# Patient Record
Sex: Female | Born: 1965 | Race: White | Hispanic: No | Marital: Married | Smoking: Never smoker
Health system: Southern US, Community
[De-identification: ages and names within clinical notes are randomized; demographics above are authoritative.]

## PROBLEM LIST (undated history)

## (undated) DIAGNOSIS — Q613 Polycystic kidney, unspecified: Secondary | ICD-10-CM

## (undated) DIAGNOSIS — A419 Sepsis, unspecified organism: Secondary | ICD-10-CM

## (undated) DIAGNOSIS — H3320 Serous retinal detachment, unspecified eye: Secondary | ICD-10-CM

## (undated) DIAGNOSIS — M069 Rheumatoid arthritis, unspecified: Secondary | ICD-10-CM

## (undated) HISTORY — PX: OTHER SURGICAL HISTORY: SHX169

## (undated) HISTORY — DX: Sepsis, unspecified organism: A41.9

## (undated) HISTORY — DX: Serous retinal detachment, unspecified eye: H33.20

## (undated) HISTORY — PX: GANGLION CYST EXCISION: SHX1691

## (undated) HISTORY — PX: VITRECTOMY: SHX106

## (undated) HISTORY — PX: AUGMENTATION MAMMAPLASTY: SUR837

## (undated) HISTORY — PX: RETINAL TEAR REPAIR CRYOTHERAPY: SHX5304

## (undated) HISTORY — PX: ABDOMINAL HERNIA REPAIR: SHX539

## (undated) HISTORY — PX: CARPAL TUNNEL RELEASE: SHX101

---

## 2006-06-15 ENCOUNTER — Encounter: Admission: RE | Admit: 2006-06-15 | Discharge: 2006-06-15 | Payer: Self-pay | Admitting: Nephrology

## 2006-06-15 IMAGING — US US RENAL
1 series · 14 of 25 positions shown · non-contrast
Comparison: none

CLINICAL DATA: Polycystic kidney disease. 
 RENAL ULTRASOUND:
TECHNIQUE: Complete ultrasound examination of the urinary tract was performed including evaluation of the kidneys, renal collecting systems, and urinary bladder.

[Series 1: us renal · 0.35mm/px · 14 of 47 slices shown]
[im 1/47]
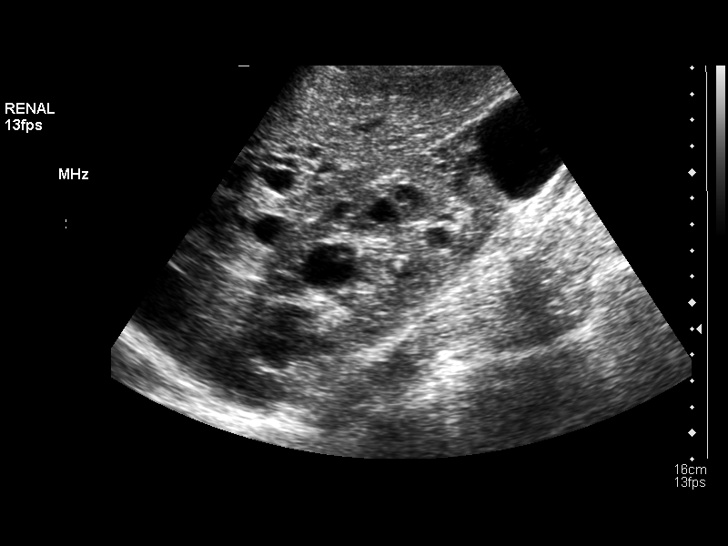
[im 4/47]
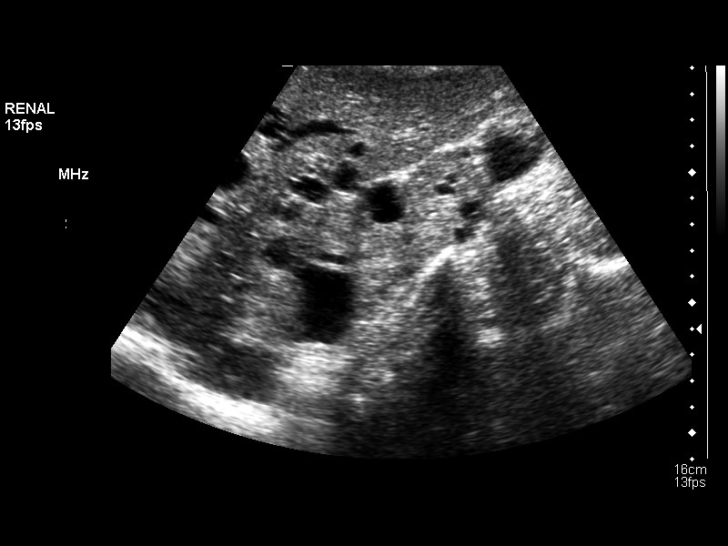
[im 8/47]
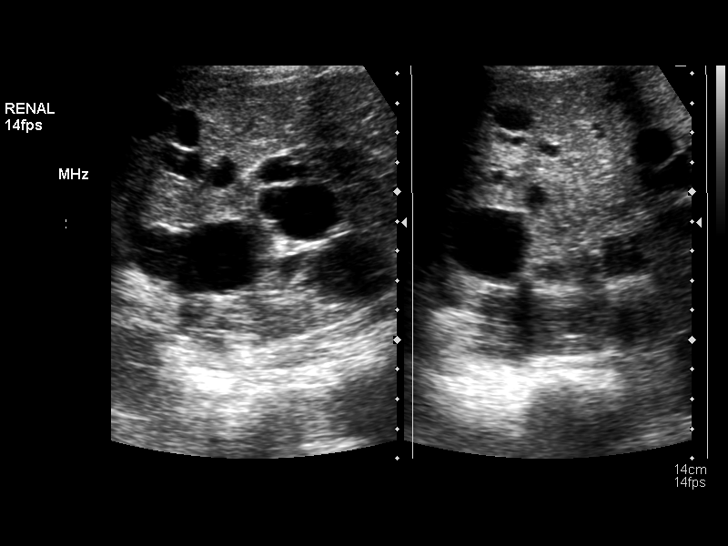
[im 12/47]
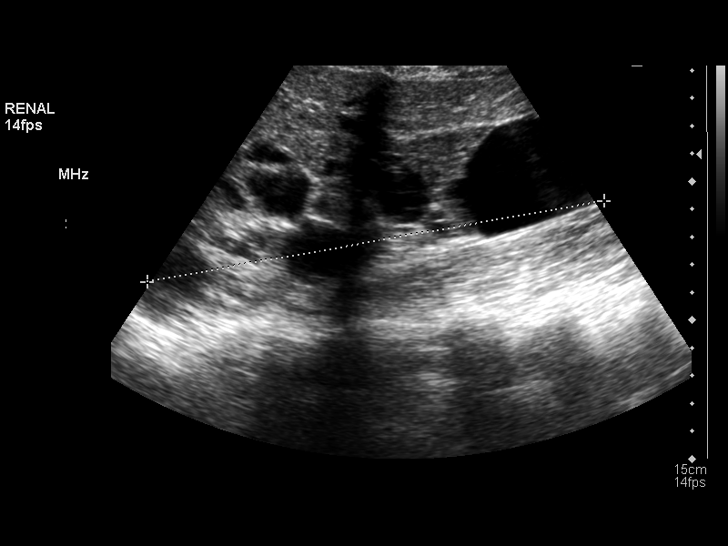
[im 16/47]
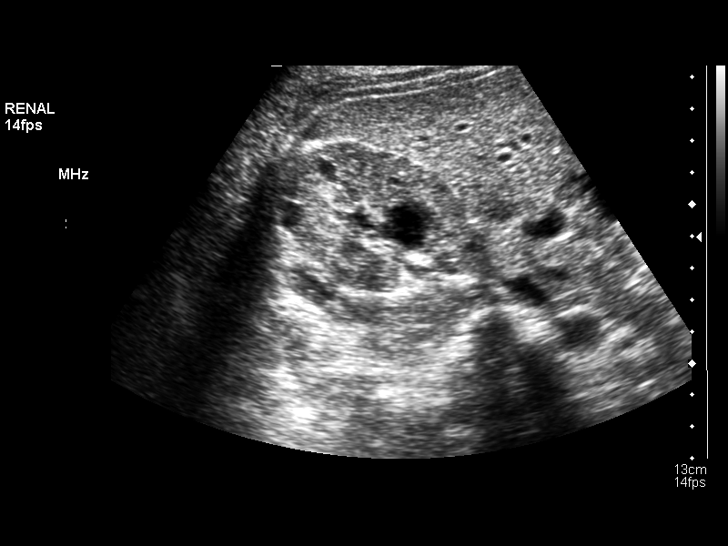
[im 18/47]
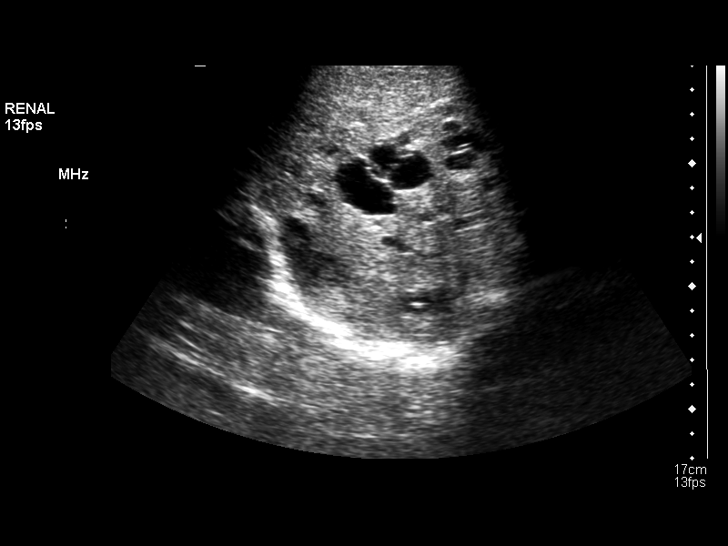
[im 22/47]
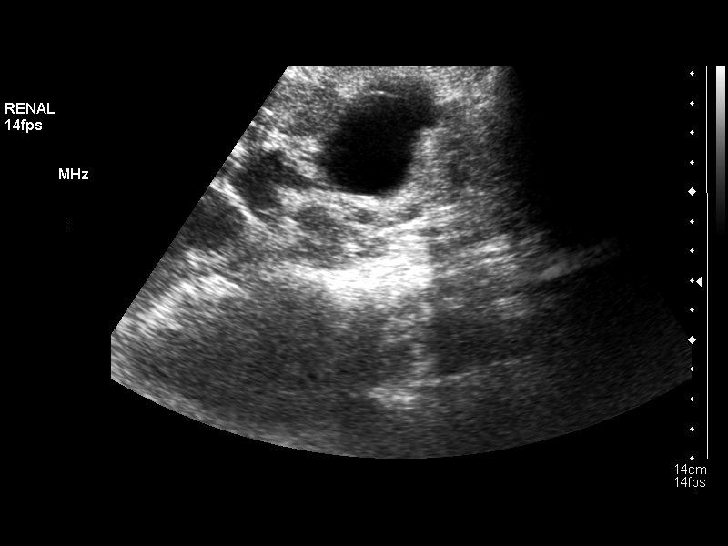
[im 25/47]
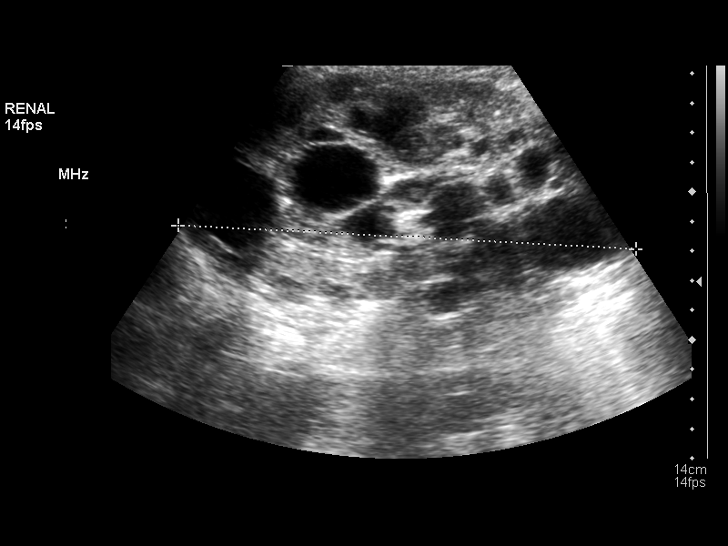
[im 29/47]
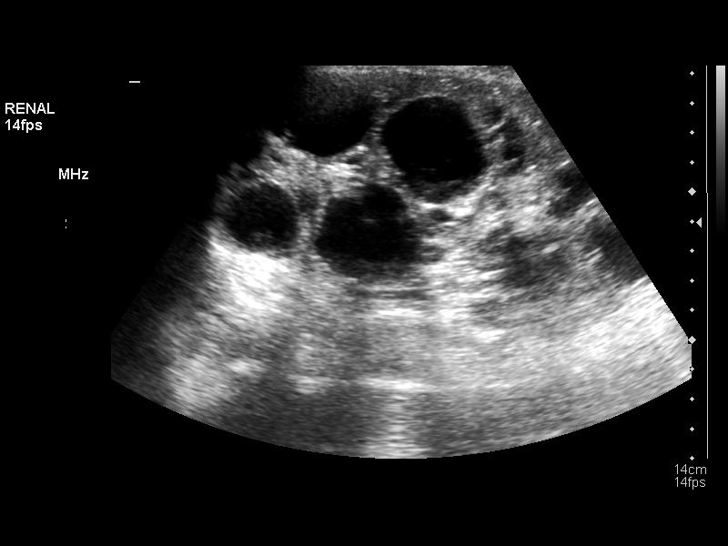
[im 31/47]
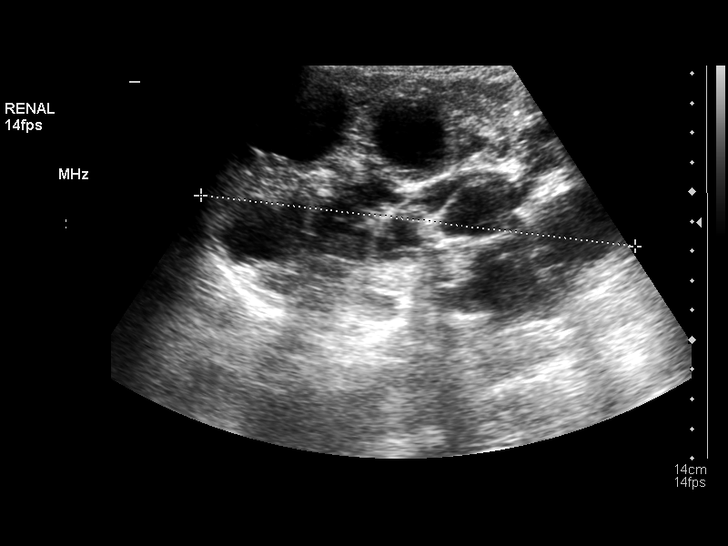
[im 35/47]
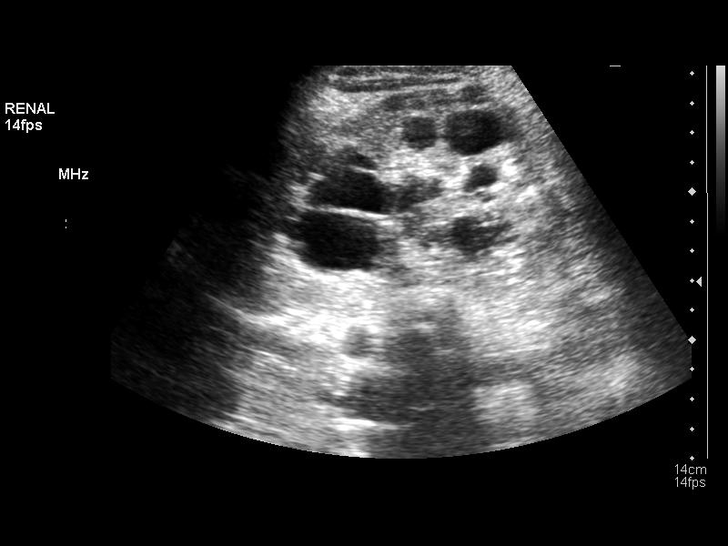
[im 39/47]
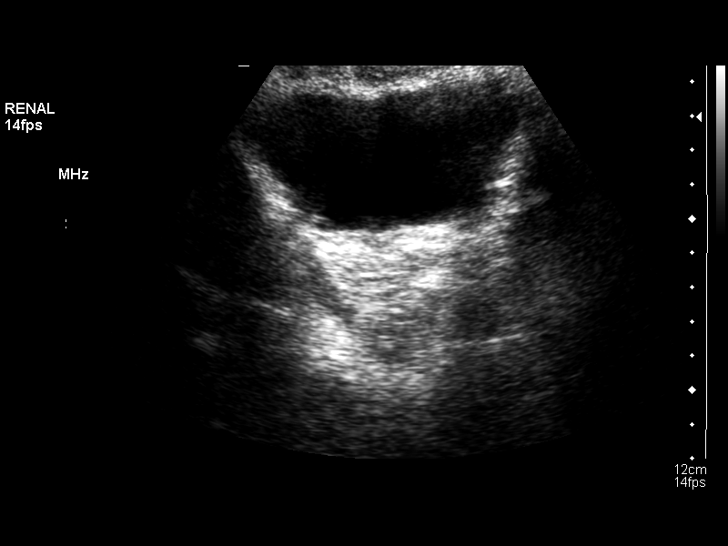
[im 43/47]
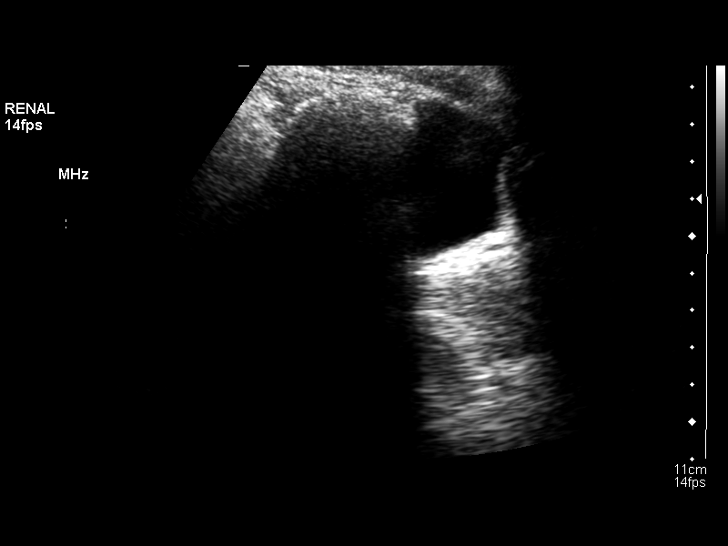
[im 47/47]
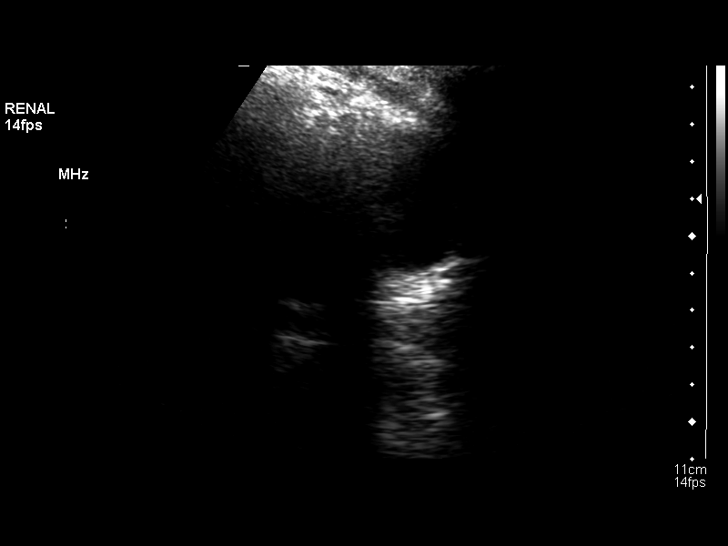

[14 of 25 positions shown; findings below may reference images not displayed]

FINDINGS: No hydronephrosis seen.  Both kidneys are enlarged and contain multiple cysts of varying sizes.  The right kidney measures 18.9 cm sagittally with the left kidney measuring 15.7 cm.  The largest cyst on the right is in the lower pole measuring 6.0 x 3.5 x 5.7 cm.   This patient does have the diagnosis of polycystic kidney disease.  No solid renal lesion is seen. The urinary bladder is unremarkable.   Multiple liver cysts are noted.
IMPRESSION: Bilaterally enlarged kidneys containing multiple cysts consistent with polycystic kidney disease.  No hydronephrosis.  No solid renal lesion.

## 2009-08-31 ENCOUNTER — Emergency Department (HOSPITAL_BASED_OUTPATIENT_CLINIC_OR_DEPARTMENT_OTHER): Admission: EM | Admit: 2009-08-31 | Discharge: 2009-08-31 | Payer: Self-pay | Admitting: Emergency Medicine

## 2010-01-25 ENCOUNTER — Emergency Department (HOSPITAL_COMMUNITY): Admission: EM | Admit: 2010-01-25 | Discharge: 2010-01-25 | Payer: Self-pay | Admitting: Emergency Medicine

## 2010-01-25 ENCOUNTER — Emergency Department (HOSPITAL_COMMUNITY): Admission: EM | Admit: 2010-01-25 | Discharge: 2010-01-25 | Payer: Self-pay | Admitting: Family Medicine

## 2010-01-27 ENCOUNTER — Encounter: Admission: RE | Admit: 2010-01-27 | Discharge: 2010-01-27 | Payer: Self-pay | Admitting: Family Medicine

## 2010-01-27 IMAGING — US US EXTREM LOW VENOUS*R*
1 series · 14 of 24 positions shown · non-contrast
Comparison: None

CLINICAL DATA: Pain and swelling

RIGHT LOWER EXTREMITY VENOUS DOPPLER ULTRASOUND
TECHNIQUE: Gray-scale sonography with compression, as well as color
and duplex ultrasound, were performed to evaluate the deep venous
system from the level of the common femoral vein through the
popliteal and proximal calf veins.

[Series 1: us extrem low venous*right* · 14 of 33 slices shown]
[im 1/33]
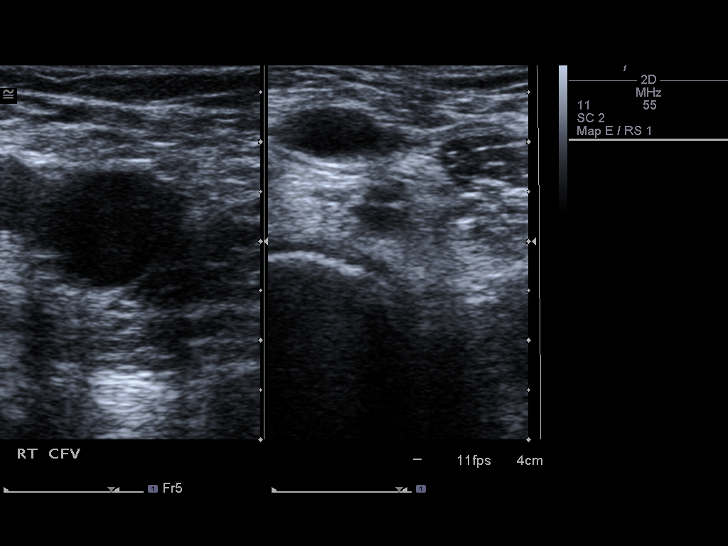
[im 3/33]
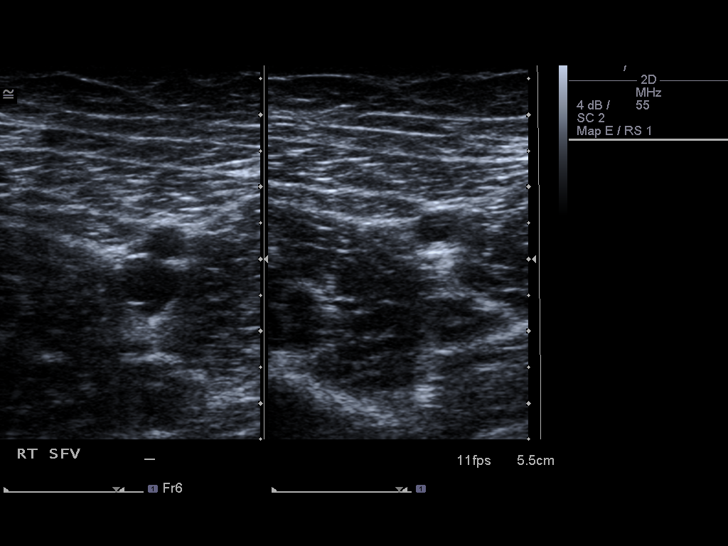
[im 6/33]
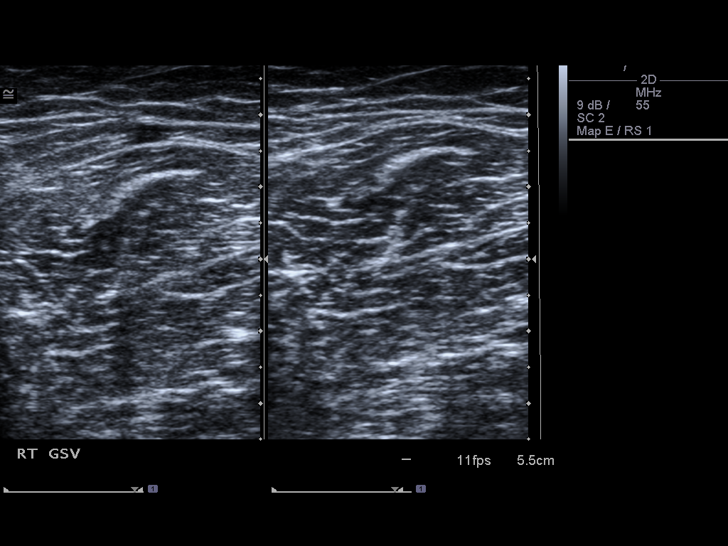
[im 9/33]
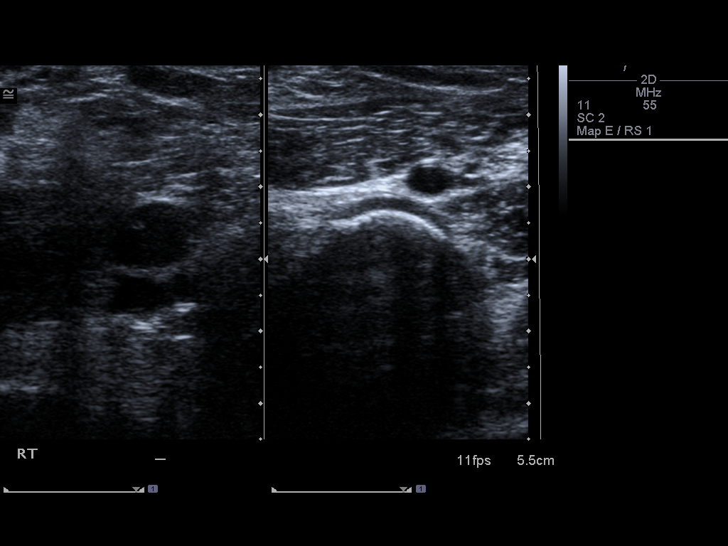
[im 10/33]
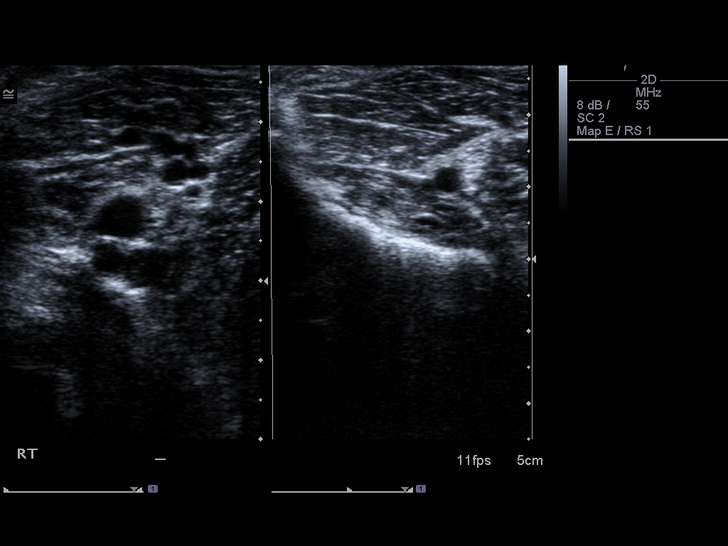
[im 13/33]
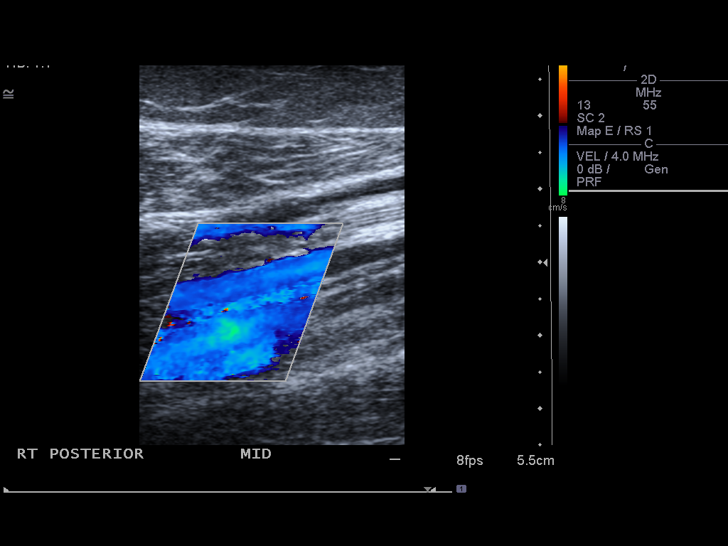
[im 16/33]
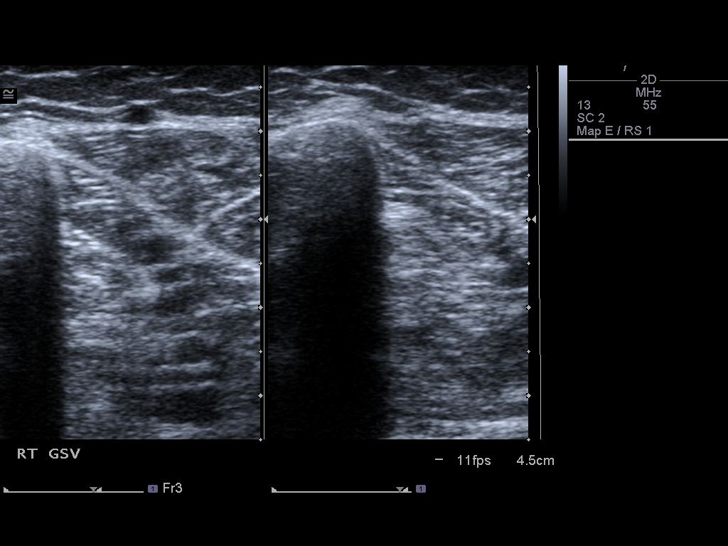
[im 17/33]
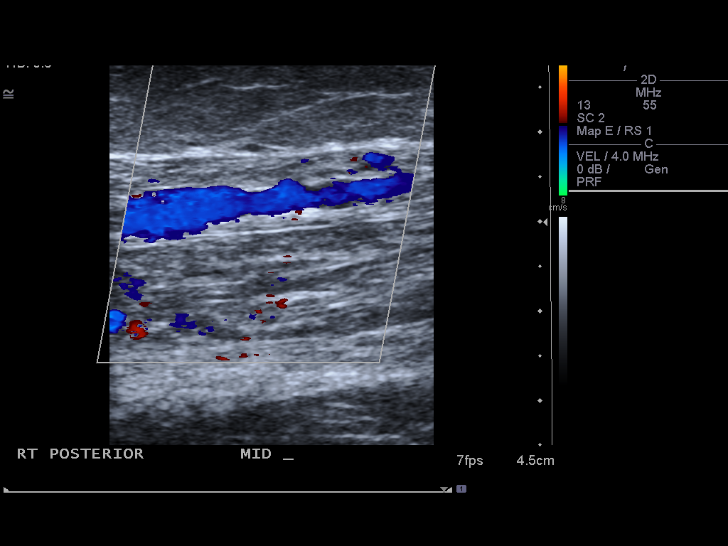
[im 20/33]
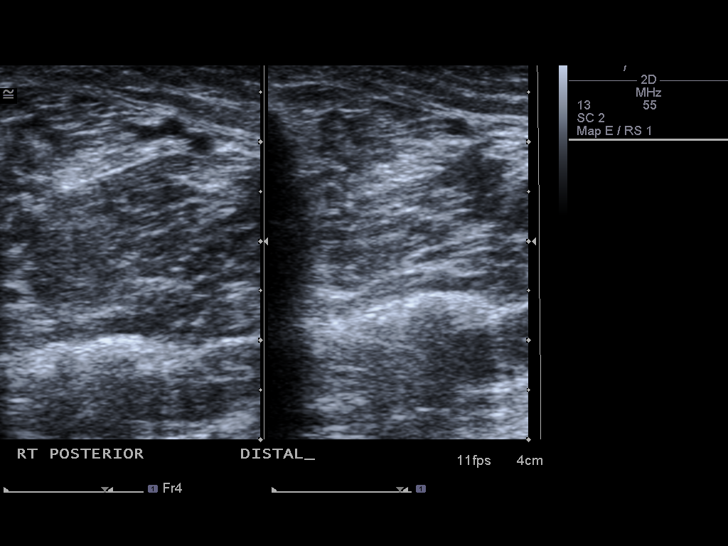
[im 23/33]
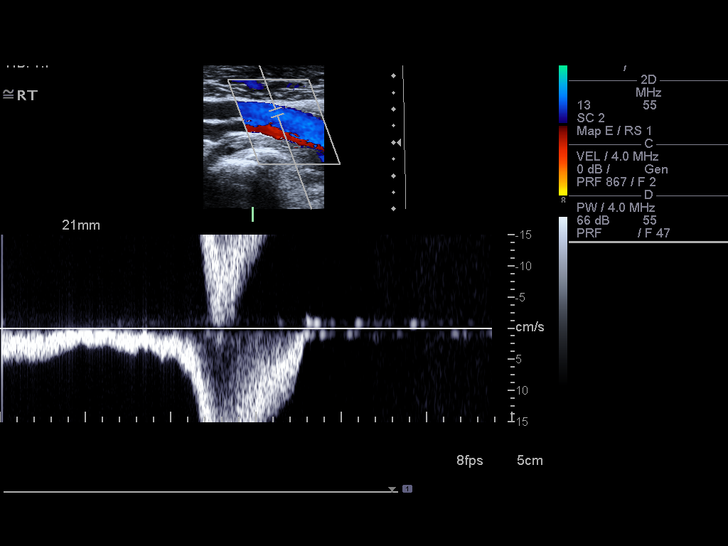
[im 26/33]
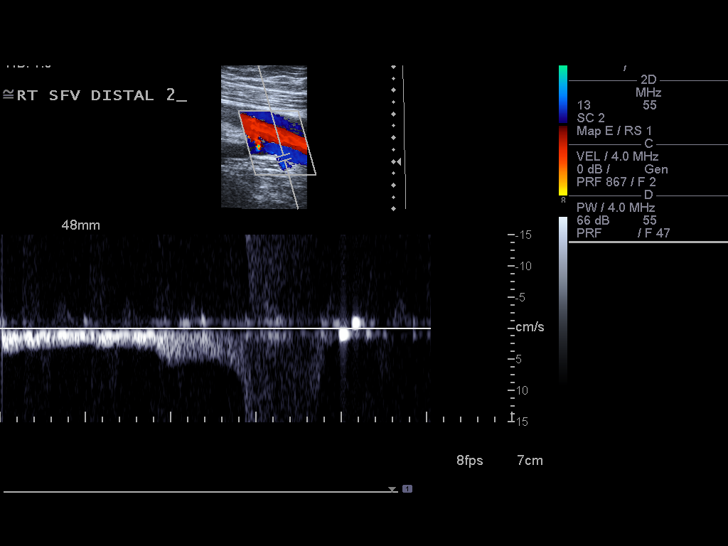
[im 27/33]
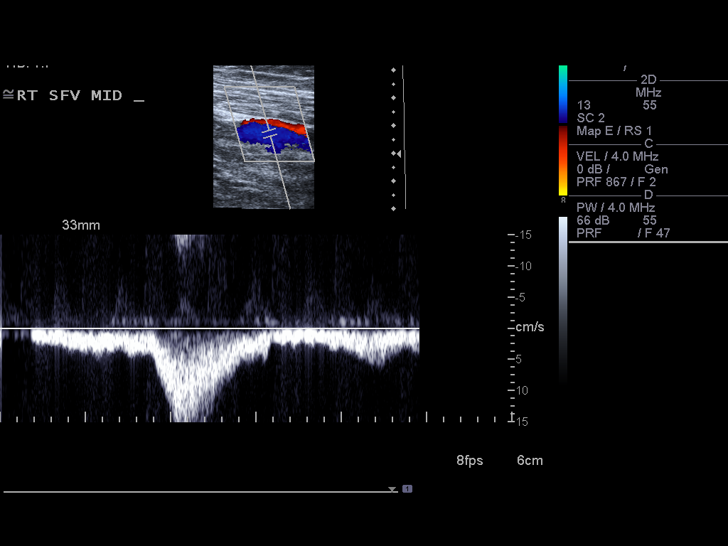
[im 30/33]
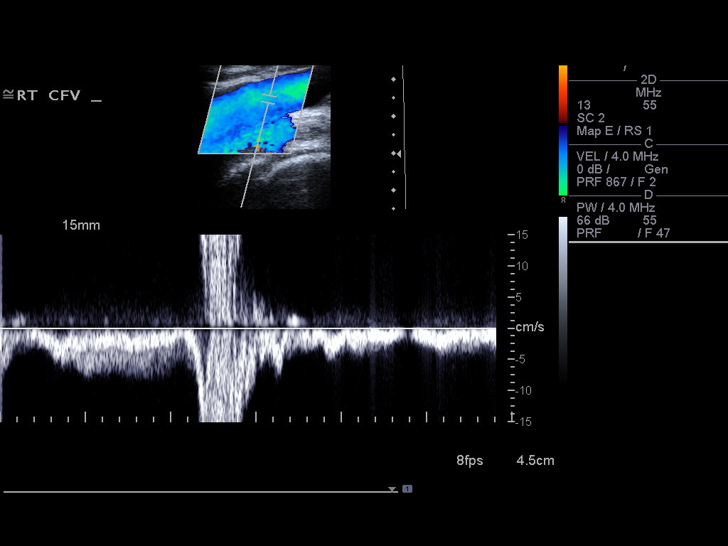
[im 33/33]
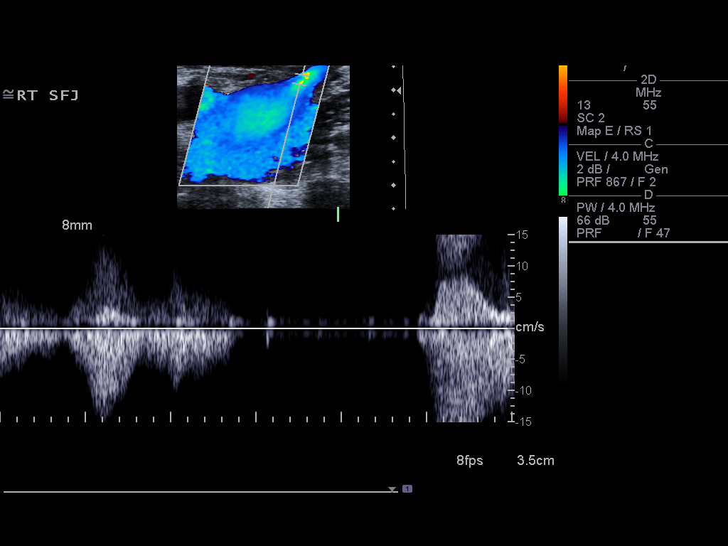

[14 of 24 positions shown; findings below may reference images not displayed]

FINDINGS: Normal compressibility of  the common femoral,
superficial femoral, and popliteal veins, as well as the proximal
calf veins.  No filling defects to suggest DVT on grayscale or
color Doppler imaging.  Doppler waveforms show normal direction of
venous flow, normal respiratory phasicity and response to
augmentation. No evidence of Baker's cyst.
IMPRESSION: No evidence of  lower extremity deep vein thrombosis.

## 2010-10-17 LAB — D-DIMER, QUANTITATIVE: D-Dimer, Quant: <0.22 ug/mL-FEU (ref 0.00–0.48)

## 2010-10-18 LAB — URINALYSIS, ROUTINE W REFLEX MICROSCOPIC
Bilirubin Urine: NEGATIVE
Glucose, UA: NEGATIVE mg/dL
Hgb urine dipstick: NEGATIVE
Ketones, ur: 40 mg/dL — AB
Nitrite: NEGATIVE
Protein, ur: NEGATIVE mg/dL
Specific Gravity, Urine: 1.021 (ref 1.005–1.030)
Urobilinogen, UA: 0.2 mg/dL (ref 0.0–1.0)
pH: 6.5 (ref 5.0–8.0)

## 2010-10-18 LAB — CBC
HCT: 41.9 % (ref 36.0–46.0)
Hemoglobin: 14.4 g/dL (ref 12.0–15.0)
MCHC: 34.4 g/dL (ref 30.0–36.0)
MCV: 91 fL (ref 78.0–100.0)
Platelets: 170 10*3/uL (ref 150–400)
RBC: 4.61 MIL/uL (ref 3.87–5.11)
RDW: 11.9 % (ref 11.5–15.5)
WBC: 8.5 10*3/uL (ref 4.0–10.5)

## 2010-10-18 LAB — DIFFERENTIAL
Basophils Absolute: 0 10*3/uL (ref 0.0–0.1)
Basophils Relative: 0 % (ref 0–1)
Eosinophils Absolute: 0 10*3/uL (ref 0.0–0.7)
Eosinophils Relative: 0 % (ref 0–5)
Lymphocytes Relative: 2 % — ABNORMAL LOW (ref 12–46)
Lymphs Abs: 0.2 10*3/uL — ABNORMAL LOW (ref 0.7–4.0)
Monocytes Absolute: 0.1 10*3/uL (ref 0.1–1.0)
Monocytes Relative: 1 % — ABNORMAL LOW (ref 3–12)
Neutro Abs: 8.2 10*3/uL — ABNORMAL HIGH (ref 1.7–7.7)
Neutrophils Relative %: 97 % — ABNORMAL HIGH (ref 43–77)

## 2010-10-18 LAB — BASIC METABOLIC PANEL
BUN: 19 mg/dL (ref 6–23)
CO2: 28 mEq/L (ref 19–32)
Calcium: 8.6 mg/dL (ref 8.4–10.5)
Chloride: 103 mEq/L (ref 96–112)
Creatinine, Ser: 1 mg/dL (ref 0.4–1.2)
GFR calc Af Amer: >60 mL/min (ref >60)
GFR calc non Af Amer: >60 mL/min (ref >60)
Glucose, Bld: 99 mg/dL (ref 70–99)
Potassium: 4.1 mEq/L (ref 3.5–5.1)
Sodium: 140 mEq/L (ref 135–145)

## 2010-10-18 LAB — PREGNANCY, URINE: Preg Test, Ur: NEGATIVE

## 2010-10-26 ENCOUNTER — Emergency Department (HOSPITAL_BASED_OUTPATIENT_CLINIC_OR_DEPARTMENT_OTHER)
Admission: EM | Admit: 2010-10-26 | Discharge: 2010-10-26 | Disposition: A | Discharge status: Home / Self Care | Payer: BC Managed Care – PPO | Attending: Emergency Medicine | Admitting: Emergency Medicine

## 2010-10-26 DIAGNOSIS — K5289 Other specified noninfective gastroenteritis and colitis: Secondary | ICD-10-CM | POA: Insufficient documentation

## 2010-10-26 DIAGNOSIS — R112 Nausea with vomiting, unspecified: Secondary | ICD-10-CM | POA: Insufficient documentation

## 2011-12-15 ENCOUNTER — Other Ambulatory Visit: Payer: Self-pay | Admitting: Family Medicine

## 2011-12-15 DIAGNOSIS — Z1231 Encounter for screening mammogram for malignant neoplasm of breast: Secondary | ICD-10-CM

## 2011-12-29 ENCOUNTER — Ambulatory Visit
Admission: RE | Admit: 2011-12-29 | Discharge: 2011-12-29 | Disposition: A | Discharge status: Home / Self Care | Payer: BC Managed Care – PPO | Source: Ambulatory Visit | Attending: Family Medicine | Admitting: Family Medicine

## 2011-12-29 DIAGNOSIS — Z1231 Encounter for screening mammogram for malignant neoplasm of breast: Secondary | ICD-10-CM

## 2013-04-03 ENCOUNTER — Other Ambulatory Visit: Payer: Self-pay | Admitting: Family Medicine

## 2013-04-03 DIAGNOSIS — Q613 Polycystic kidney, unspecified: Secondary | ICD-10-CM

## 2013-04-08 ENCOUNTER — Ambulatory Visit
Admission: RE | Admit: 2013-04-08 | Discharge: 2013-04-08 | Disposition: A | Discharge status: Home / Self Care | Payer: BC Managed Care – PPO | Source: Ambulatory Visit | Attending: Family Medicine | Admitting: Family Medicine

## 2013-04-08 DIAGNOSIS — Q613 Polycystic kidney, unspecified: Secondary | ICD-10-CM

## 2013-04-08 IMAGING — US US RENAL
1 series · 14 of 25 positions shown · non-contrast
Comparison: [DATE]

CLINICAL DATA: History of polycystic kidney disease.  Hematuria.
Multiple urinary tract infections.

RENAL/URINARY TRACT ULTRASOUND COMPLETE

[Series 1: us renal · 0.31mm/px · 14 of 65 slices shown]
[im 1/65]
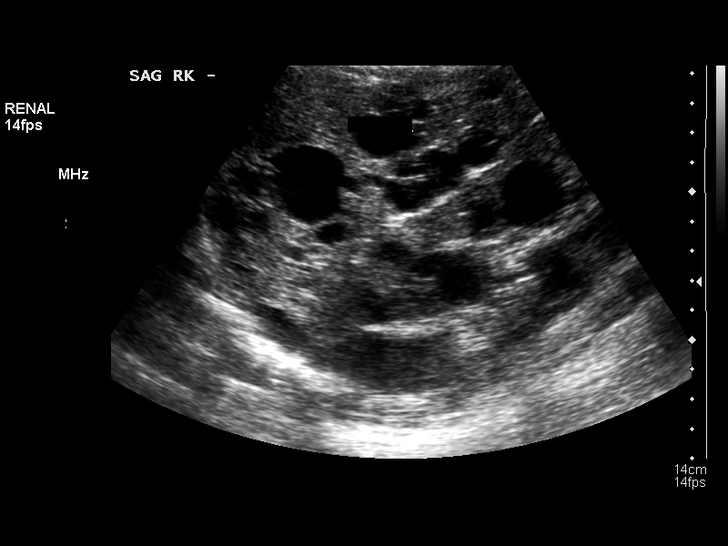
[im 6/65]
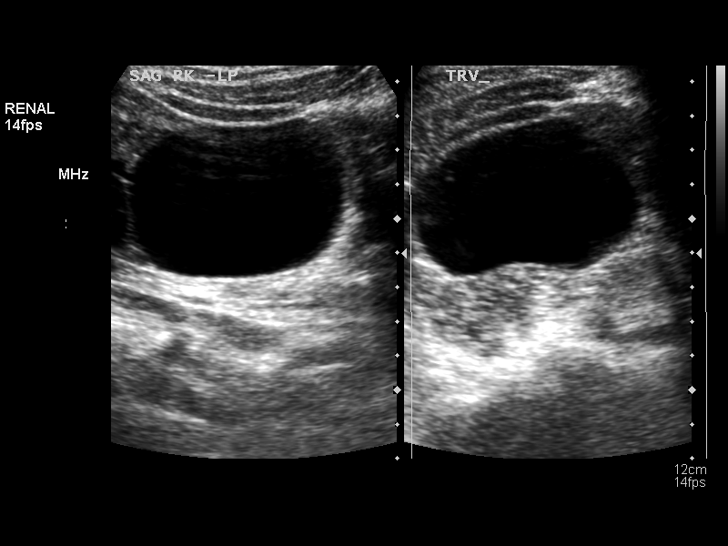
[im 11/65]
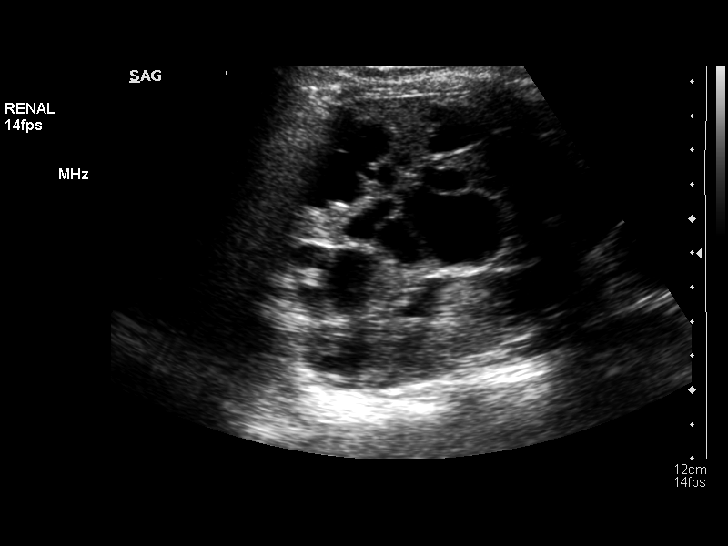
[im 17/65]
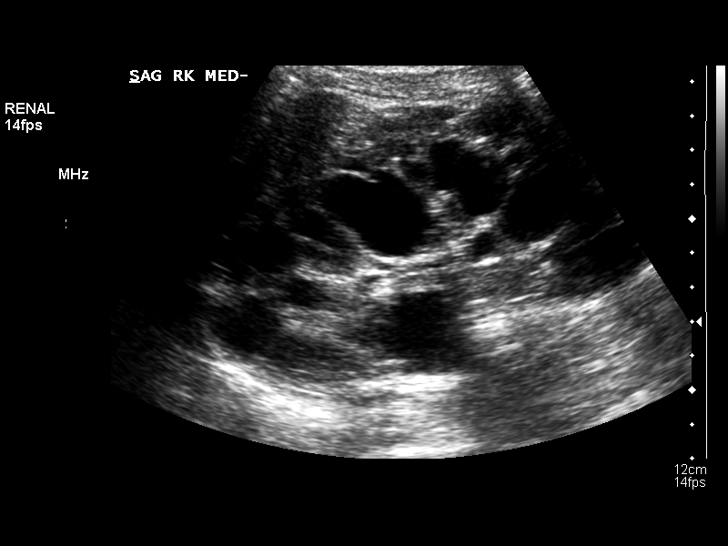
[im 22/65]
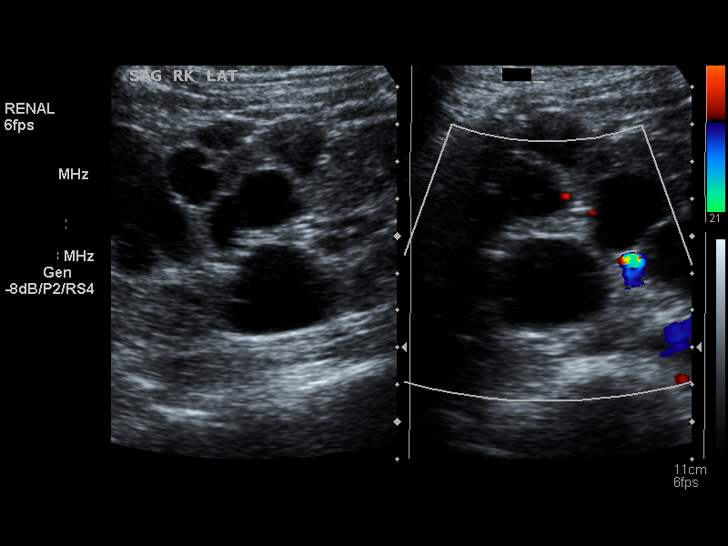
[im 25/65]
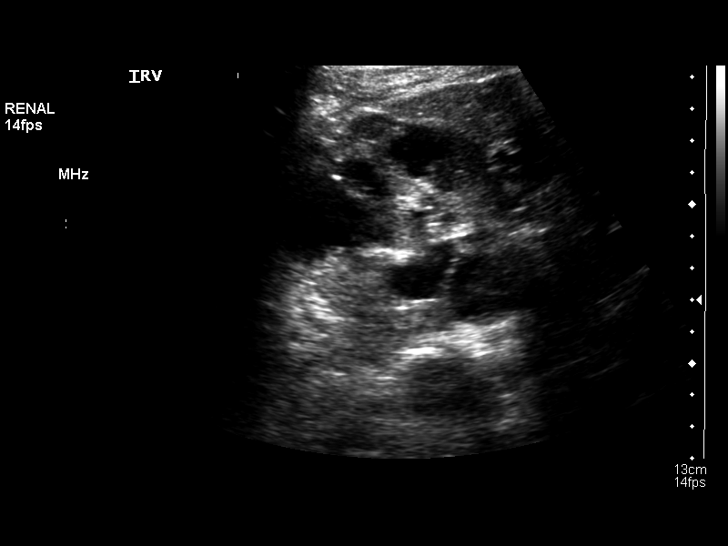
[im 30/65]
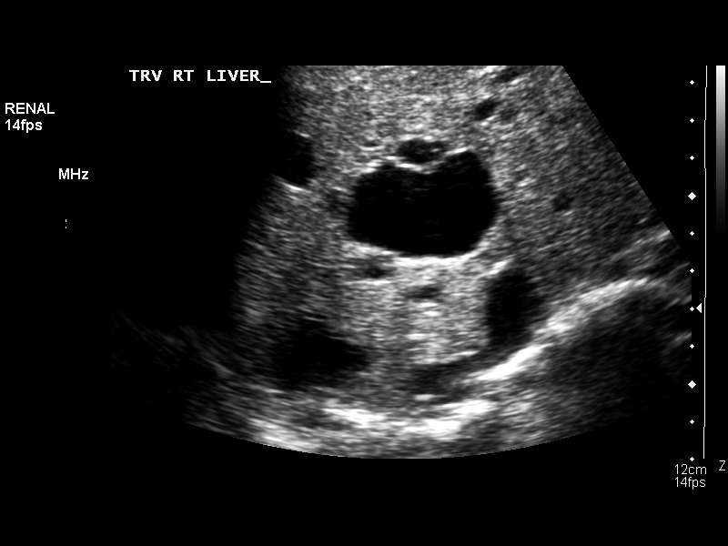
[im 35/65]
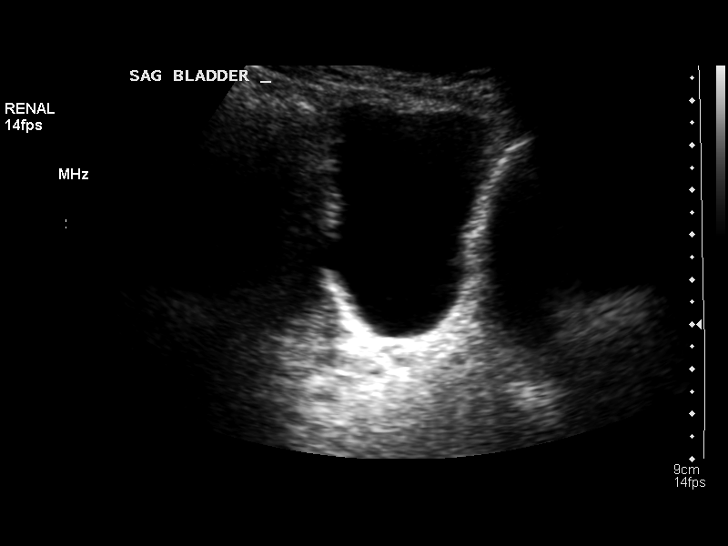
[im 41/65]
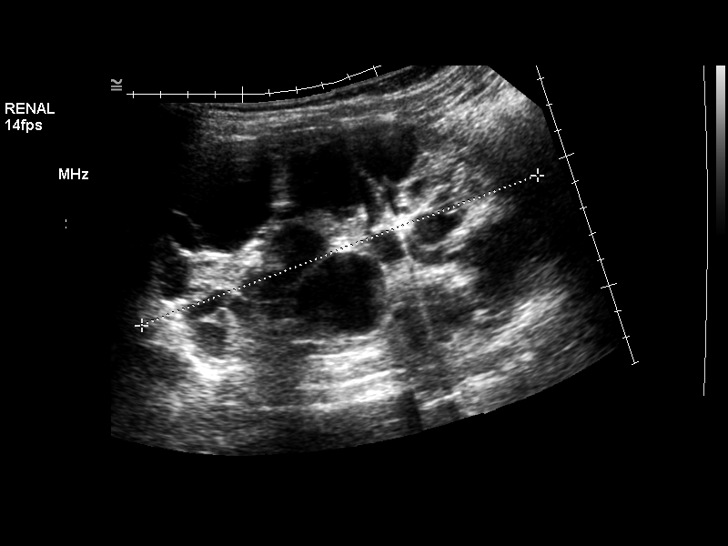
[im 43/65]
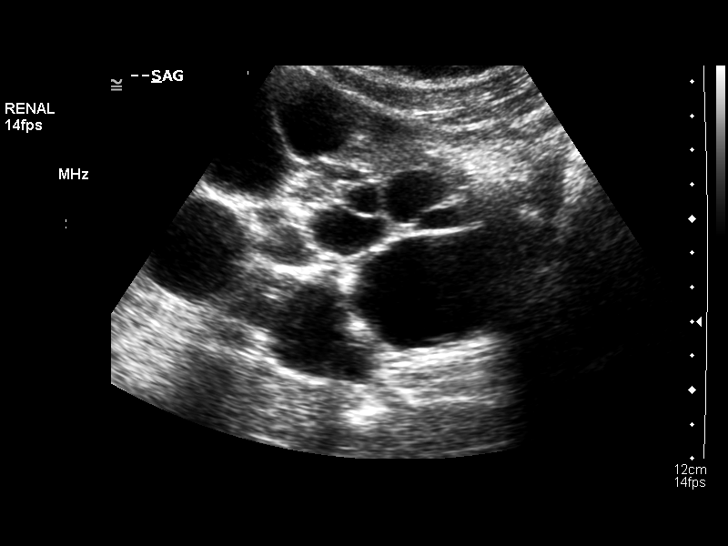
[im 49/65]
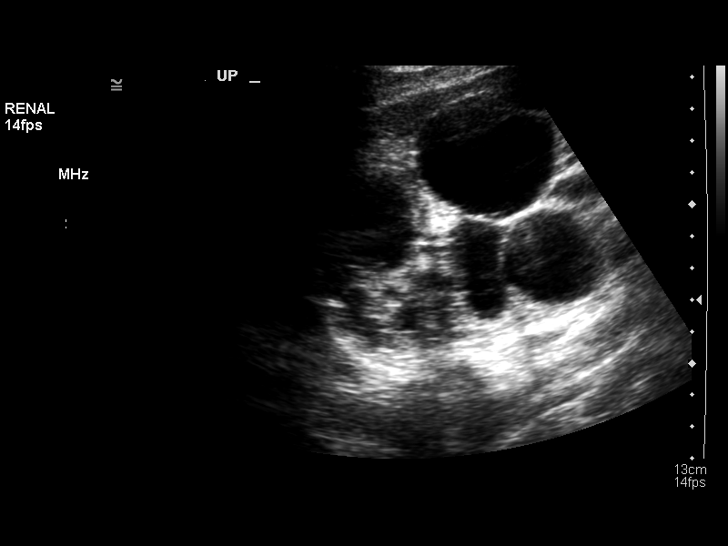
[im 54/65]
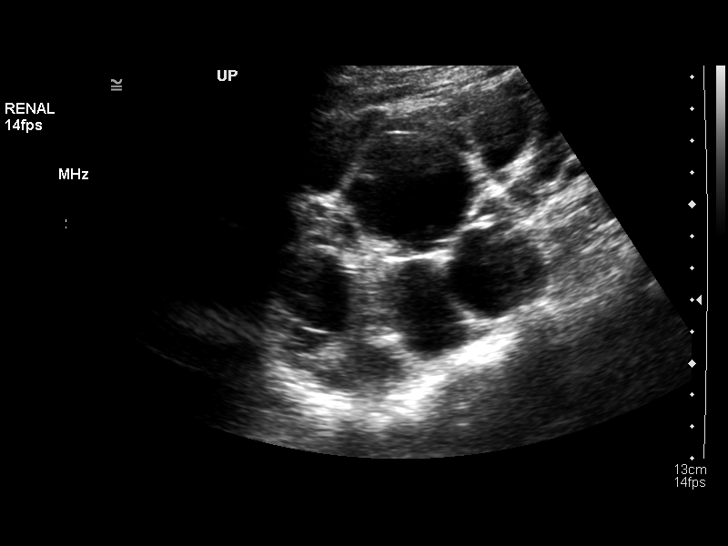
[im 59/65]
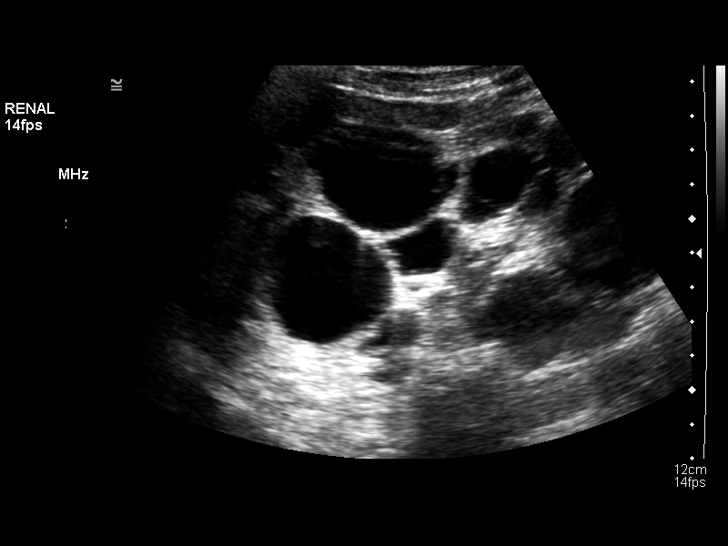
[im 65/65]
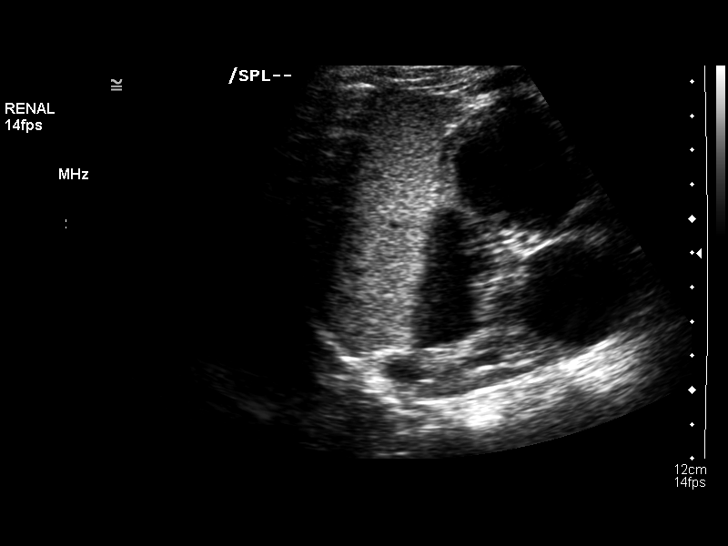

[14 of 25 positions shown; findings below may reference images not displayed]

FINDINGS: Right Kidney:  Enlarged with numerous cysts.  The right kidney
measures 17 cm in length.  The largest cyst arises from the lower
pole measuring 6.3 cm in greatest dimension.  No solid or
suspicious masses are seen.  There is no hydronephrosis.

Left Kidney:  Enlarged measuring 17 cm.  There are multiple cysts.
The largest arises from the lower pole measuring 5.4 cm in greatest
dimension.  No solid or suspicious masses.  There is no
hydronephrosis.

Bladder:  Unremarkable

There are also multiple liver cysts.
IMPRESSION: There are enlarged kidneys with multiple bilateral renal cysts, as
well as liver cysts, findings consistent with the given diagnosis
of autosomal dominant polycystic kidney disease.

No solid or suspicious renal masses.  No hydronephrosis.

Findings are similar to the prior exam.

## 2013-05-06 DIAGNOSIS — G43909 Migraine, unspecified, not intractable, without status migrainosus: Secondary | ICD-10-CM | POA: Insufficient documentation

## 2013-05-06 DIAGNOSIS — Q613 Polycystic kidney, unspecified: Secondary | ICD-10-CM | POA: Insufficient documentation

## 2013-07-30 DIAGNOSIS — N390 Urinary tract infection, site not specified: Secondary | ICD-10-CM | POA: Insufficient documentation

## 2014-06-17 ENCOUNTER — Other Ambulatory Visit: Payer: Self-pay | Admitting: Family Medicine

## 2014-06-17 DIAGNOSIS — Z1231 Encounter for screening mammogram for malignant neoplasm of breast: Secondary | ICD-10-CM

## 2017-06-18 DIAGNOSIS — G5603 Carpal tunnel syndrome, bilateral upper limbs: Secondary | ICD-10-CM | POA: Insufficient documentation

## 2017-10-31 DIAGNOSIS — M6740 Ganglion, unspecified site: Secondary | ICD-10-CM | POA: Insufficient documentation

## 2018-01-05 ENCOUNTER — Other Ambulatory Visit: Payer: Self-pay | Admitting: Family Medicine

## 2018-01-05 DIAGNOSIS — Z1231 Encounter for screening mammogram for malignant neoplasm of breast: Secondary | ICD-10-CM

## 2018-01-26 ENCOUNTER — Ambulatory Visit
Admission: RE | Admit: 2018-01-26 | Discharge: 2018-01-26 | Disposition: A | Discharge status: Home / Self Care | Payer: Managed Care, Other (non HMO) | Source: Ambulatory Visit | Attending: Family Medicine | Admitting: Family Medicine

## 2018-01-26 DIAGNOSIS — Z1231 Encounter for screening mammogram for malignant neoplasm of breast: Secondary | ICD-10-CM

## 2018-03-19 ENCOUNTER — Telehealth (INDEPENDENT_AMBULATORY_CARE_PROVIDER_SITE_OTHER): Payer: Self-pay | Admitting: Family Medicine

## 2018-03-19 NOTE — Telephone Encounter (Signed)
Spoke with patient, advising that it is preferable if she if fasting since we will most likely be doing bloodwork on the day of her visit.  Encouraged her to drink plenty of water that day.

## 2018-03-19 NOTE — Telephone Encounter (Signed)
Patient called to schedule a physical with Dr. Junius Roads and wanted to know if she needed to fast before her appointment.  CB#[phone].  Thank you.

## 2018-03-23 ENCOUNTER — Ambulatory Visit (INDEPENDENT_AMBULATORY_CARE_PROVIDER_SITE_OTHER): Payer: Managed Care, Other (non HMO) | Admitting: Family Medicine

## 2018-03-23 ENCOUNTER — Encounter (INDEPENDENT_AMBULATORY_CARE_PROVIDER_SITE_OTHER): Payer: Self-pay | Admitting: Family Medicine

## 2018-03-23 VITALS — BP 130/77 | HR 77 | Temp 97.4°F | Ht 60.25 in | Wt 122.8 lb

## 2018-03-23 DIAGNOSIS — Q613 Polycystic kidney, unspecified: Secondary | ICD-10-CM | POA: Diagnosis not present

## 2018-03-23 DIAGNOSIS — Z85828 Personal history of other malignant neoplasm of skin: Secondary | ICD-10-CM

## 2018-03-23 DIAGNOSIS — Z0000 Encounter for general adult medical examination without abnormal findings: Secondary | ICD-10-CM

## 2018-03-23 DIAGNOSIS — M0580 Other rheumatoid arthritis with rheumatoid factor of unspecified site: Secondary | ICD-10-CM | POA: Insufficient documentation

## 2018-03-23 DIAGNOSIS — R5383 Other fatigue: Secondary | ICD-10-CM | POA: Diagnosis not present

## 2018-03-23 DIAGNOSIS — N390 Urinary tract infection, site not specified: Secondary | ICD-10-CM

## 2018-03-23 DIAGNOSIS — M083 Juvenile rheumatoid polyarthritis (seronegative): Secondary | ICD-10-CM

## 2018-03-23 DIAGNOSIS — G43809 Other migraine, not intractable, without status migrainosus: Secondary | ICD-10-CM

## 2018-03-23 DIAGNOSIS — M059 Rheumatoid arthritis with rheumatoid factor, unspecified: Secondary | ICD-10-CM | POA: Insufficient documentation

## 2018-03-23 NOTE — Progress Notes (Signed)
Office Visit Note   Patient: Judy Manning           Date of Birth: [birth year]           MRN: [id number] Visit Date: 03/23/2018 Requested by: No referring provider defined for this encounter. PCP: Eunice Blase, MD  Subjective: Chief Complaint  Patient presents with  . Annual Exam    HPI: She is here to reestablish care.  Her main concern is chronic fatigue.  This is been a problem for about 7 or 8 years.  She wakes up at 4:00 every morning and cannot go back to sleep.  She does not drink any caffeine.  She has polycystic kidney disease monitored by nephrology.  Numbers have remained stable.  She has to be cautious with protein intake.  Labs revealed a slightly positive rheumatoid factor a couple years ago.  She had multiple joint arthritis at that time.  She was placed on popliteal temporarily and then prednisone.  Prednisone helped substantially and after about a week.  She has not had any flareups since then.  She is trying to avoid medication.  Her mother has a history of rheumatoid arthritis.  Chronic migraines have been very well controlled on gabapentin.  She has been on this for many years.  She does not know whether it is contributing to her chronic fatigue.                ROS: All other systems were reviewed and are negative.  Health Maintenance: Colonoscopy: Not yet Immunizations: Up-to-date Pap/Mammogram: About 3 years ago Dentist: Every 6 months Eye: Yearly   Objective: Vital Signs: BP 130/77 (BP Location: Right Arm, Patient Position: Sitting, Cuff Size: Normal)   Pulse 77   Temp (!) 97.4 F (36.3 C)   Ht 5' 0.25" (1.53 m)   Wt 122 lb 12 oz (55.7 kg)   BMI 23.77 kg/m   Physical Exam:  HEENT:  [place]/AT, PERRLA, EOM Full, no nystagmus.  Funduscopic examination within normal limits.  No conjunctival erythema.  Tympanic membranes are pearly gray with normal landmarks.  External ear canals are normal.  Nasal passages are clear.  Oropharynx is clear.  No  significant lymphadenopathy.  No thyromegaly or nodules.  2+ carotid pulses without bruits. CV: Regular rate and rhythm without murmurs, rubs, or gallops.  No peripheral edema.  2+ radial and posterior tibial pulses. Lungs: Clear to auscultation throughout with no wheezing or areas of consolidation. Abd: Bowel sounds are active, no hepatosplenomegaly or masses.  Soft and nontender.  No audible bruits.  No evidence of ascites. EXT: Hyperactive upper and lower DTRs. SKIN: No suspicious lesions.    Imaging: None today  Assessment & Plan: 1.  Wellness exam -Fasting labs in the near future.  2.  Polycystic kidney disease -Stable and managed by nephrology.  3.  Chronic migraine -Well-controlled with gabapentin but question whether it might be contributing to chronic fatigue. - Could contemplate other options for headache control.  4.  Chronic fatigue -Labs to evaluate.  Consider stopping gabapentin if labs are unremarkable and symptoms persist.  5.  History of positive rheumatoid factor -We will monitor periodically.    Follow-Up Instructions: No follow-ups on file.     Procedures: None today.   PMFS History: Patient Active Problem List   Diagnosis Date Noted  . Ganglion cyst 10/31/2017  . Bilateral carpal tunnel syndrome 06/18/2017  . Recurrent UTI 07/30/2013  . Migraine 05/06/2013  . Polycystic kidney disease 05/06/2013  History reviewed. No pertinent past medical history.  History reviewed. No pertinent family history.  Past Surgical History:  Procedure Laterality Date  . AUGMENTATION MAMMAPLASTY     Social History   Occupational History  . Not on file  Tobacco Use  . Smoking status: Never Smoker  . Smokeless tobacco: Never Used  Substance and Sexual Activity  . Alcohol use: Not Currently    Frequency: Never    Comment: rarely ever  . Drug use: Never  . Sexual activity: Not on file

## 2018-03-26 ENCOUNTER — Encounter (INDEPENDENT_AMBULATORY_CARE_PROVIDER_SITE_OTHER): Payer: Self-pay | Admitting: Family Medicine

## 2018-03-26 MED ORDER — NYSTATIN 100000 UNIT/GM EX CREA: 1.0000 "application " | TOPICAL_CREAM | Freq: Two times a day (BID) | CUTANEOUS | 11 refills | Status: DC

## 2018-03-26 MED ORDER — TRETINOIN 0.025 % EX GEL: Freq: Every day | CUTANEOUS | 11 refills | Status: DC

## 2018-03-29 ENCOUNTER — Other Ambulatory Visit (INDEPENDENT_AMBULATORY_CARE_PROVIDER_SITE_OTHER): Payer: Self-pay | Admitting: Family Medicine

## 2018-03-30 ENCOUNTER — Encounter (INDEPENDENT_AMBULATORY_CARE_PROVIDER_SITE_OTHER): Payer: Self-pay | Admitting: Family Medicine

## 2018-03-30 ENCOUNTER — Other Ambulatory Visit (INDEPENDENT_AMBULATORY_CARE_PROVIDER_SITE_OTHER): Payer: Self-pay | Admitting: Family Medicine

## 2018-03-30 DIAGNOSIS — E7849 Other hyperlipidemia: Secondary | ICD-10-CM

## 2018-03-30 LAB — SPECIMEN STATUS REPORT

## 2018-03-30 NOTE — Progress Notes (Signed)
Labs are notable for the following:  Thyroid TSH looks good, but T4 is slightly low.  This could be contributing to fatigue.  I would suggest rechecking thyroid function in 1 to 2 months and if T4 remains abnormal, we might consider a low dose of thyroid hormone.  Lipids are elevated.  In general, exercise and dietary limitation of processed carbohydrates and sweets will help these to normalize.  Because of family history, it might be worthwhile to order a cardiac CT calcium score test to better assess cardiac risk.  In agreement, I will request the test.  [email or web address]  [facility]

## 2018-04-05 ENCOUNTER — Encounter (INDEPENDENT_AMBULATORY_CARE_PROVIDER_SITE_OTHER): Payer: Self-pay | Admitting: Family Medicine

## 2018-04-05 ENCOUNTER — Telehealth (INDEPENDENT_AMBULATORY_CARE_PROVIDER_SITE_OTHER): Payer: Self-pay | Admitting: Family Medicine

## 2018-04-05 LAB — LIPID PANEL W/O CHOL/HDL RATIO
Cholesterol, Total: 241 mg/dL — ABNORMAL HIGH (ref 100–199)
HDL: 67 mg/dL (ref >39)
LDL Calculated: 157 mg/dL — ABNORMAL HIGH (ref 0–99)
Triglycerides: 85 mg/dL (ref 0–149)
VLDL Cholesterol Cal: 17 mg/dL (ref 5–40)

## 2018-04-05 LAB — THYROID PANEL WITH TSH
Free Thyroxine Index: 1.2 (ref 1.2–4.9)
T3 Uptake Ratio: 34 % (ref 24–39)
T4, Total: 3.5 ug/dL — ABNORMAL LOW (ref 4.5–12.0)
TSH: 1.44 u[IU]/mL (ref 0.450–4.500)

## 2018-04-05 LAB — THYROID PEROXIDASE ANTIBODY: Thyroperoxidase Ab SerPl-aCnc: 27 IU/mL (ref 0–34)

## 2018-04-05 LAB — FERRITIN: Ferritin: 17 ng/mL (ref 15–150)

## 2018-04-05 NOTE — Telephone Encounter (Signed)
Thyroid antibodies were negative/normal.  [facility]

## 2018-04-06 ENCOUNTER — Other Ambulatory Visit (INDEPENDENT_AMBULATORY_CARE_PROVIDER_SITE_OTHER): Payer: Self-pay | Admitting: Family Medicine

## 2018-04-06 DIAGNOSIS — E7849 Other hyperlipidemia: Secondary | ICD-10-CM

## 2018-04-09 ENCOUNTER — Encounter (INDEPENDENT_AMBULATORY_CARE_PROVIDER_SITE_OTHER): Payer: Self-pay | Admitting: Family Medicine

## 2018-04-10 MED ORDER — BACLOFEN 10 MG PO TABS: 5.0000 mg | ORAL_TABLET | Freq: Three times a day (TID) | ORAL | 1 refills | Status: DC | PRN

## 2018-04-17 ENCOUNTER — Encounter (INDEPENDENT_AMBULATORY_CARE_PROVIDER_SITE_OTHER): Payer: Self-pay | Admitting: Family Medicine

## 2018-04-25 ENCOUNTER — Encounter (INDEPENDENT_AMBULATORY_CARE_PROVIDER_SITE_OTHER): Payer: Self-pay | Admitting: Family Medicine

## 2018-04-26 MED ORDER — FLUCONAZOLE 150 MG PO TABS: ORAL_TABLET | ORAL | 3 refills | Status: DC

## 2018-05-10 ENCOUNTER — Ambulatory Visit (INDEPENDENT_AMBULATORY_CARE_PROVIDER_SITE_OTHER): Payer: Managed Care, Other (non HMO) | Admitting: Family Medicine

## 2018-05-10 ENCOUNTER — Encounter (INDEPENDENT_AMBULATORY_CARE_PROVIDER_SITE_OTHER): Payer: Self-pay | Admitting: Family Medicine

## 2018-05-10 DIAGNOSIS — M79601 Pain in right arm: Secondary | ICD-10-CM | POA: Diagnosis not present

## 2018-05-10 NOTE — Progress Notes (Signed)
   Office Visit Note   Patient: Judy Manning           Date of Birth: Jul 29, 1966           MRN: [id number] Visit Date: 05/10/2018 Requested by: Eunice Blase, MD [address] [place], [place] [address] PCP: Eunice Blase, MD  Subjective: Chief Complaint  Patient presents with  . Right Forearm - Pain    Pain since August, after carrying a heavy suitcase up stairs.  History of surgery on a tendon in the right elbow.    HPI: She is here with right forearm pain.  Symptoms started in August after carrying a heavy suitcase.  Pain on the dorsal aspect of her forearm.  Years ago she had lateral epicondylar release.  She was concerned she might have reinjured that area.              ROS: Noncontributory  Objective: Vital Signs: There were no vitals taken for this visit.  Physical Exam:  Right arm: No tenderness at the common extensor tendon at the lateral epicondyle.  Her pain is distal to that in the muscle belly.  There is a little tenderness at the radial tunnel as well.  No pain with wrist extension, third finger extension, forearm pronation or supination against resistance.  Imaging: None today.  Assessment & Plan: 1.  Right arm pain, suspect extensor muscular strain.  Cannot rule out radial tunnel syndrome. -Occupational/hand therapy.  Ultrasound imaging if symptoms persist.   Follow-Up Instructions: Return if symptoms worsen or fail to improve.       Procedures: None today.   PMFS History: Patient Active Problem List   Diagnosis Date Noted  . History of basal cell carcinoma (BCC) 03/23/2018  . 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 Other fatigue 03/23/2018  . Ganglion cyst 10/31/2017  . Bilateral carpal tunnel syndrome 06/18/2017  . Recurrent UTI 07/30/2013  . Migraine 05/06/2013  . Polycystic kidney disease 05/06/2013   History reviewed. No pertinent past medical history.  Family History  Problem Relation Age of Onset  . Rheum  arthritis Mother   . Stroke Mother   . Heart disease Mother   . Stroke Father     Past Surgical History:  Procedure Laterality Date  . AUGMENTATION MAMMAPLASTY     Social History   Occupational History  . Not on file  Tobacco Use  . Smoking status: Never Smoker  . Smokeless tobacco: Never Used  Substance and Sexual Activity  . Alcohol use: Not Currently    Frequency: Never    Comment: rarely ever  . Drug use: Never  . Sexual activity: Not on file

## 2018-06-04 ENCOUNTER — Encounter (INDEPENDENT_AMBULATORY_CARE_PROVIDER_SITE_OTHER): Payer: Self-pay | Admitting: Family Medicine

## 2018-06-05 ENCOUNTER — Other Ambulatory Visit (INDEPENDENT_AMBULATORY_CARE_PROVIDER_SITE_OTHER): Payer: Self-pay | Admitting: Family Medicine

## 2018-06-05 MED ORDER — ZOLPIDEM TARTRATE 10 MG PO TABS: 5.0000 mg | ORAL_TABLET | Freq: Every evening | ORAL | 3 refills | Status: DC | PRN

## 2018-09-18 DIAGNOSIS — N921 Excessive and frequent menstruation with irregular cycle: Secondary | ICD-10-CM | POA: Insufficient documentation

## 2018-09-18 DIAGNOSIS — D219 Benign neoplasm of connective and other soft tissue, unspecified: Secondary | ICD-10-CM | POA: Insufficient documentation

## 2018-09-18 DIAGNOSIS — R87619 Unspecified abnormal cytological findings in specimens from cervix uteri: Secondary | ICD-10-CM | POA: Insufficient documentation

## 2018-10-03 ENCOUNTER — Encounter (INDEPENDENT_AMBULATORY_CARE_PROVIDER_SITE_OTHER): Payer: Self-pay | Admitting: Family Medicine

## 2018-11-08 ENCOUNTER — Encounter (INDEPENDENT_AMBULATORY_CARE_PROVIDER_SITE_OTHER): Payer: Self-pay | Admitting: Family Medicine

## 2018-11-10 MED ORDER — CIPROFLOXACIN HCL 500 MG PO TABS: 500.0000 mg | ORAL_TABLET | Freq: Two times a day (BID) | ORAL | 0 refills | Status: DC

## 2019-01-10 ENCOUNTER — Encounter (INDEPENDENT_AMBULATORY_CARE_PROVIDER_SITE_OTHER): Payer: Self-pay | Admitting: Family Medicine

## 2019-01-11 MED ORDER — METHYLPREDNISOLONE 4 MG PO TBPK: ORAL_TABLET | ORAL | 0 refills | Status: DC

## 2019-02-19 ENCOUNTER — Encounter (INDEPENDENT_AMBULATORY_CARE_PROVIDER_SITE_OTHER): Payer: Self-pay | Admitting: Family Medicine

## 2019-02-24 MED ORDER — ZOLPIDEM TARTRATE 10 MG PO TABS: 5.0000 mg | ORAL_TABLET | Freq: Every evening | ORAL | 3 refills | Status: DC | PRN

## 2019-03-16 ENCOUNTER — Encounter (INDEPENDENT_AMBULATORY_CARE_PROVIDER_SITE_OTHER): Payer: Self-pay | Admitting: Family Medicine

## 2019-03-18 MED ORDER — ADAPALENE 0.1 % EX CREA: TOPICAL_CREAM | Freq: Every day | CUTANEOUS | 6 refills | Status: DC

## 2019-03-18 NOTE — Addendum Note (Signed)
Addended by: Hortencia Pilar on: 03/18/2019 10:44 AM   Modules accepted: Orders

## 2019-03-20 ENCOUNTER — Other Ambulatory Visit: Payer: Self-pay | Admitting: Family Medicine

## 2019-03-20 ENCOUNTER — Encounter (INDEPENDENT_AMBULATORY_CARE_PROVIDER_SITE_OTHER): Payer: Self-pay | Admitting: Family Medicine

## 2019-03-20 MED ORDER — TRETINOIN 0.025 % EX CREA: TOPICAL_CREAM | Freq: Every day | CUTANEOUS | 11 refills | Status: DC

## 2019-04-29 ENCOUNTER — Encounter (INDEPENDENT_AMBULATORY_CARE_PROVIDER_SITE_OTHER): Payer: Self-pay | Admitting: Family Medicine

## 2019-04-29 MED ORDER — OLOPATADINE HCL 0.1 % OP SOLN: 1.0000 [drp] | Freq: Two times a day (BID) | OPHTHALMIC | 12 refills | Status: DC

## 2019-05-06 ENCOUNTER — Encounter: Payer: Self-pay | Admitting: Family Medicine

## 2019-05-06 ENCOUNTER — Ambulatory Visit (INDEPENDENT_AMBULATORY_CARE_PROVIDER_SITE_OTHER): Payer: 59 | Admitting: Family Medicine

## 2019-05-06 ENCOUNTER — Other Ambulatory Visit: Payer: Self-pay

## 2019-05-06 VITALS — BP 113/68 | HR 72 | Temp 98.2°F | Resp 16 | Ht 60.0 in | Wt 118.8 lb

## 2019-05-06 DIAGNOSIS — Z0000 Encounter for general adult medical examination without abnormal findings: Secondary | ICD-10-CM | POA: Diagnosis not present

## 2019-05-06 DIAGNOSIS — Z85828 Personal history of other malignant neoplasm of skin: Secondary | ICD-10-CM

## 2019-05-06 DIAGNOSIS — M79671 Pain in right foot: Secondary | ICD-10-CM

## 2019-05-06 DIAGNOSIS — Q613 Polycystic kidney, unspecified: Secondary | ICD-10-CM | POA: Diagnosis not present

## 2019-05-06 DIAGNOSIS — G43809 Other migraine, not intractable, without status migrainosus: Secondary | ICD-10-CM | POA: Diagnosis not present

## 2019-05-06 DIAGNOSIS — Z8639 Personal history of other endocrine, nutritional and metabolic disease: Secondary | ICD-10-CM

## 2019-05-06 DIAGNOSIS — E7849 Other hyperlipidemia: Secondary | ICD-10-CM

## 2019-05-06 DIAGNOSIS — M79672 Pain in left foot: Secondary | ICD-10-CM

## 2019-05-06 DIAGNOSIS — M0580 Other rheumatoid arthritis with rheumatoid factor of unspecified site: Secondary | ICD-10-CM

## 2019-05-06 MED ORDER — ZOLPIDEM TARTRATE 10 MG PO TABS: 5.0000 mg | ORAL_TABLET | Freq: Every evening | ORAL | 0 refills | Status: DC | PRN

## 2019-05-06 MED ORDER — CHLORTHALIDONE 25 MG PO TABS: 25.0000 mg | ORAL_TABLET | Freq: Every day | ORAL | 3 refills | Status: DC

## 2019-05-06 NOTE — Progress Notes (Signed)
Cologuard    Office Visit Note   Patient: Judy Manning           Date of Birth: 1965-09-08           MRN: [id number] Visit Date: 05/06/2019 Requested by: Eunice Blase, MD [address] [place],  [place] [address] PCP: Eunice Blase, MD  Subjective: Chief Complaint  Patient presents with  . Annual Exam    HPI: She is here for her wellness examination.  Overall she is doing very well, only minor complaint is chronic bilateral foot pain when standing a lot.  Pain is across the ball of her feet.  It used to take standing all day to bring on pain, but now it happens only after about an hour.  Pain goes away pretty quickly with rest.  History of polycystic kidney disease which has been stable.  Migraines have been well controlled.  She is going to see her dermatologist in the near future for a skin check.  She has a history of positive rheumatoid factor, basically asymptomatic.  She states that she has had very little appetite since she was a child.  She only eats once daily, and it is usually something like only a few crackers.  She takes a multivitamin and some other individual vitamins.  Some of this happened due to traumatic childhood experiences.  Her sense of smell is okay, but she does not enjoy the taste of foods.                ROS: No fevers or chills.  No sinus congestion.  All other systems were reviewed and are negative.  Objective: Vital Signs: BP 113/68 (BP Location: Left Arm, Patient Position: Sitting, Cuff Size: Normal)   Pulse 72   Temp 98.2 F (36.8 C)   Resp 16   Ht 5' (1.524 m)   Wt 118 lb 12 oz (53.9 kg)   BMI 23.19 kg/m   Physical Exam:  General:  Alert and oriented, in no acute distress. Pulm:  Breathing unlabored. Psy:  Normal mood, congruent affect. Skin: Normal, no suspicious lesions. HEENT:  [place]/AT, PERRLA, EOM Full, no nystagmus.  Funduscopic examination within normal limits.  No conjunctival erythema.  Tympanic membranes are pearly  gray with normal landmarks.  External ear canals are normal.  Nasal passages are clear.  Oropharynx is clear.  No significant lymphadenopathy.  No thyromegaly or nodules.  2+ carotid pulses without bruits. CV: Regular rate and rhythm without murmurs, rubs, or gallops.  No peripheral edema.  2+ radial and posterior tibial pulses. Lungs: Clear to auscultation throughout with no wheezing or areas of consolidation. Abd: Bowel sounds are active, no hepatosplenomegaly or masses.  Soft and nontender.  No audible bruits.  No evidence of ascites. Feet: She has bilateral tight hamstrings and heel cords.  There is no tenderness to palpation of her feet, normal foot structure.   Imaging: None today.  Assessment & Plan: 1.  Wellness examination, basically normal. -Labs to evaluate.  Follow-up in 1 year.  Cologuard this year. -She has gynecology and dermatology appointments in the near future.  2.  Bilateral foot pain etiology uncertain, could be related to tight hamstrings and heel cords. -Gave her some stretching exercises.     Procedures: No procedures performed  No notes on file     PMFS History: Patient Active Problem List   Diagnosis Date Noted  . History of basal cell carcinoma (BCC) 03/23/2018  . Polyarthritis with positive rheumatoid factor ([facility])  03/23/2018  . Other fatigue 03/23/2018  . Ganglion cyst 10/31/2017  . Bilateral carpal tunnel syndrome 06/18/2017  . Recurrent UTI 07/30/2013  . Migraine 05/06/2013  . Polycystic kidney disease 05/06/2013   History reviewed. No pertinent past medical history.  Family History  Problem Relation Age of Onset  . Rheum arthritis Mother   . Heart disease Mother        Pacemaker  . Stroke Father   . Polycystic kidney disease Father   . Skin cancer Father   . Cancer Father   . Arthritis Brother   . Alzheimer's disease Maternal Grandmother 88  . Diabetes Paternal Grandfather   . Skin cancer Paternal Grandfather   . Cancer Paternal  Grandfather   . Breast cancer Neg Hx   . Colon cancer Neg Hx     Past Surgical History:  Procedure Laterality Date  . AUGMENTATION MAMMAPLASTY     Social History   Occupational History  . Not on file  Tobacco Use  . Smoking status: Never Smoker  . Smokeless tobacco: Never Used  Substance and Sexual Activity  . Alcohol use: Not Currently    Frequency: Never    Comment: rarely ever  . Drug use: Never  . Sexual activity: Not on file

## 2019-05-07 ENCOUNTER — Telehealth: Payer: Self-pay | Admitting: Family Medicine

## 2019-05-07 LAB — CBC WITH DIFFERENTIAL/PLATELET
Absolute Monocytes: 347 cells/uL (ref 200–950)
Basophils Absolute: 39 cells/uL (ref 0–200)
Basophils Relative: 1.1 %
Eosinophils Absolute: 98 cells/uL (ref 15–500)
Eosinophils Relative: 2.8 %
HCT: 40.2 % (ref 35.0–45.0)
Hemoglobin: 13.7 g/dL (ref 11.7–15.5)
Lymphs Abs: 697 cells/uL — ABNORMAL LOW (ref 850–3900)
MCH: 29.9 pg (ref 27.0–33.0)
MCHC: 34.1 g/dL (ref 32.0–36.0)
MCV: 87.8 fL (ref 80.0–100.0)
MPV: 11.7 fL (ref 7.5–12.5)
Monocytes Relative: 9.9 %
Neutro Abs: 2321 cells/uL (ref 1500–7800)
Neutrophils Relative %: 66.3 %
Platelets: 196 10*3/uL (ref 140–400)
RBC: 4.58 10*6/uL (ref 3.80–5.10)
RDW: 13 % (ref 11.0–15.0)
Total Lymphocyte: 19.9 %
WBC: 3.5 10*3/uL — ABNORMAL LOW (ref 3.8–10.8)

## 2019-05-07 LAB — LIPID PANEL
Cholesterol: 224 mg/dL — ABNORMAL HIGH (ref <200)
HDL: 75 mg/dL (ref >=50)
LDL Cholesterol (Calc): 133 mg/dL (calc) — ABNORMAL HIGH
Non-HDL Cholesterol (Calc): 149 mg/dL (calc) — ABNORMAL HIGH (ref <130)
Total CHOL/HDL Ratio: 3 (calc) (ref <5.0)
Triglycerides: 68 mg/dL (ref <150)

## 2019-05-07 LAB — COMPREHENSIVE METABOLIC PANEL
AG Ratio: 1.7 (calc) (ref 1.0–2.5)
ALT: 12 U/L (ref 6–29)
AST: 20 U/L (ref 10–35)
Albumin: 4.3 g/dL (ref 3.6–5.1)
Alkaline phosphatase (APISO): 61 U/L (ref 37–153)
BUN/Creatinine Ratio: 9 (calc) (ref 6–22)
BUN: 13 mg/dL (ref 7–25)
CO2: 30 mmol/L (ref 20–32)
Calcium: 9.2 mg/dL (ref 8.6–10.4)
Chloride: 100 mmol/L (ref 98–110)
Creat: 1.51 mg/dL — ABNORMAL HIGH (ref 0.50–1.05)
Globulin: 2.6 g/dL (calc) (ref 1.9–3.7)
Glucose, Bld: 78 mg/dL (ref 65–99)
Potassium: 3.6 mmol/L (ref 3.5–5.3)
Sodium: 139 mmol/L (ref 135–146)
Total Bilirubin: 0.5 mg/dL (ref 0.2–1.2)
Total Protein: 6.9 g/dL (ref 6.1–8.1)

## 2019-05-07 LAB — THYROID PANEL WITH TSH
Free Thyroxine Index: 1.3 — ABNORMAL LOW (ref 1.4–3.8)
T3 Uptake: 42 % — ABNORMAL HIGH (ref 22–35)
T4, Total: 3.1 ug/dL — ABNORMAL LOW (ref 5.1–11.9)
TSH: 1.16 mIU/L

## 2019-05-07 LAB — VITAMIN D 25 HYDROXY (VIT D DEFICIENCY, FRACTURES): Vit D, 25-Hydroxy: 91 ng/mL (ref 30–100)

## 2019-05-07 LAB — HIGH SENSITIVITY CRP: hs-CRP: 0.3 mg/L

## 2019-05-07 NOTE — Telephone Encounter (Signed)
Labs show:  Creatinine is up this time at 1.51.  Should probably recheck in a few months.  Lipids are up a little.  Best to manage with exercise.  Could consider Fish Oil.  Thyroid TSH looks good (overall function), but other numbers are slightly off.  No treatment needed now, but will recheck in a few months.  All else looks good.

## 2019-05-07 NOTE — Telephone Encounter (Signed)
Vitamin D looks good.   

## 2019-05-20 ENCOUNTER — Other Ambulatory Visit: Payer: Self-pay

## 2019-05-20 DIAGNOSIS — Z20822 Contact with and (suspected) exposure to covid-19: Secondary | ICD-10-CM

## 2019-05-21 LAB — NOVEL CORONAVIRUS, NAA: SARS-CoV-2, NAA: NOT DETECTED

## 2019-05-27 ENCOUNTER — Encounter: Payer: Self-pay | Admitting: Family Medicine

## 2019-05-27 MED ORDER — CHLORTHALIDONE 25 MG PO TABS: 25.0000 mg | ORAL_TABLET | Freq: Every day | ORAL | 3 refills | Status: DC

## 2019-07-30 ENCOUNTER — Other Ambulatory Visit: Payer: Self-pay | Admitting: Family Medicine

## 2019-07-30 MED ORDER — ZOLPIDEM TARTRATE 10 MG PO TABS: 5.0000 mg | ORAL_TABLET | Freq: Every evening | ORAL | 1 refills | Status: DC | PRN

## 2019-08-08 ENCOUNTER — Encounter: Payer: Self-pay | Admitting: Family Medicine

## 2019-08-09 MED ORDER — CIPROFLOXACIN HCL 500 MG PO TABS: 500.0000 mg | ORAL_TABLET | Freq: Two times a day (BID) | ORAL | 0 refills | Status: DC

## 2019-09-25 ENCOUNTER — Encounter: Payer: Self-pay | Admitting: Family Medicine

## 2019-09-25 ENCOUNTER — Other Ambulatory Visit: Payer: Self-pay

## 2019-09-25 ENCOUNTER — Ambulatory Visit (INDEPENDENT_AMBULATORY_CARE_PROVIDER_SITE_OTHER): Payer: 59 | Admitting: Family Medicine

## 2019-09-25 DIAGNOSIS — G5602 Carpal tunnel syndrome, left upper limb: Secondary | ICD-10-CM

## 2019-09-25 NOTE — Progress Notes (Signed)
   Office Visit Note   Patient: Judy Manning           Date of Birth: 01-17-1966           MRN: [id number] Visit Date: 09/25/2019 Requested by: Eunice Blase, MD [address] [place],  [place] [address] PCP: Eunice Blase, MD  Subjective: Chief Complaint  Patient presents with  . Left Hand - Pain    Starting to get numbness in the fingers again and pain in the palm of her hand. Stiffness in the fingers in the morning. Injections have helped in the past. Right-hand dominant.    HPI: She is here with left carpal tunnel syndrome.  Problems with this over the years, previously did well with injections per Dr. Burney Gauze.  Last injection was about 2 years ago.  She has been doing some renovations in cleaning around the house in the past 6 weeks and her symptoms have returned.  She uses night splints.              ROS:   All other systems were reviewed and are negative.  Objective: Vital Signs: There were no vitals taken for this visit.  Physical Exam:  General:  Alert and oriented, in no acute distress. Pulm:  Breathing unlabored. Psy:  Normal mood, congruent affect.  Left wrist: She has no thenar atrophy, good strength.  Positive Tinel's at the carpal tunnel and positive Phalen's test.  Imaging: None today  Assessment & Plan: 1.   Recurrent left carpal tunnel syndrome -Injection today.  Follow-up as needed.     Procedures: Landmark guided left carpal tunnel injection: After sterile prep with Betadine, injected 1 cc 1% lidocaine without epinephrine and 40 mg methylprednisolone into the carpal tunnel.    PMFS History: Patient Active Problem List   Diagnosis Date Noted  . History of basal cell carcinoma (BCC) 03/23/2018  . 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 Other fatigue 03/23/2018  . Ganglion cyst 10/31/2017  . Bilateral carpal tunnel syndrome 06/18/2017  . Recurrent UTI 07/30/2013  . Migraine 05/06/2013  . Polycystic kidney disease  05/06/2013   History reviewed. No pertinent past medical history.  Family History  Problem Relation Age of Onset  . Rheum arthritis Mother   . Heart disease Mother        Pacemaker  . Stroke Father   . Polycystic kidney disease Father   . Skin cancer Father   . Cancer Father   . Arthritis Brother   . Alzheimer's disease Maternal Grandmother 81  . Diabetes Paternal Grandfather   . Skin cancer Paternal Grandfather   . Cancer Paternal Grandfather   . Breast cancer Neg Hx   . Colon cancer Neg Hx     Past Surgical History:  Procedure Laterality Date  . AUGMENTATION MAMMAPLASTY     Social History   Occupational History  . Not on file  Tobacco Use  . Smoking status: Never Smoker  . Smokeless tobacco: Never Used  Substance and Sexual Activity  . Alcohol use: Not Currently    Comment: rarely ever  . Drug use: Never  . Sexual activity: Not on file

## 2019-10-24 ENCOUNTER — Encounter: Payer: Self-pay | Admitting: Family Medicine

## 2019-10-25 ENCOUNTER — Ambulatory Visit
Admission: RE | Admit: 2019-10-25 | Discharge: 2019-10-25 | Disposition: A | Discharge status: Home / Self Care | Payer: 59 | Source: Ambulatory Visit | Attending: Nephrology | Admitting: Nephrology

## 2019-10-25 ENCOUNTER — Other Ambulatory Visit: Payer: Self-pay | Admitting: Nephrology

## 2019-10-25 DIAGNOSIS — N183 Chronic kidney disease, stage 3 unspecified: Secondary | ICD-10-CM

## 2019-10-25 IMAGING — US US RENAL
1 series · 13 of 25 positions shown · non-contrast
Comparison: Prior ultrasound from [DATE].

CLINICAL DATA: Initial evaluation for stage 3 chronic kidney
disease. History of polycystic kidney disease.

EXAM:
RENAL / URINARY TRACT ULTRASOUND COMPLETE

[Series 1: us renal · 0.25mm/px · 13 of 78 slices shown]
[im 1/78]
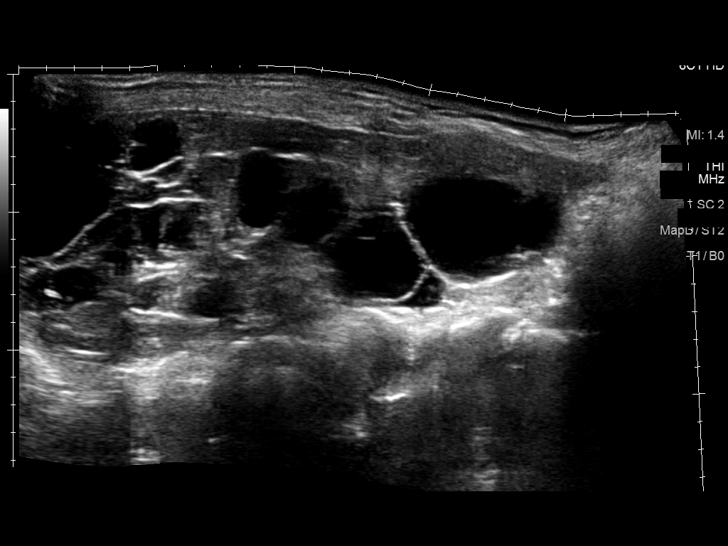
[im 7/78]
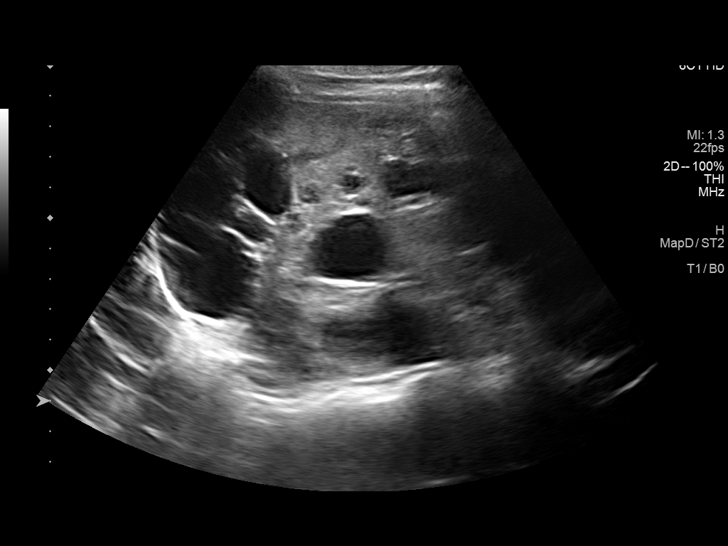
[im 13/78]
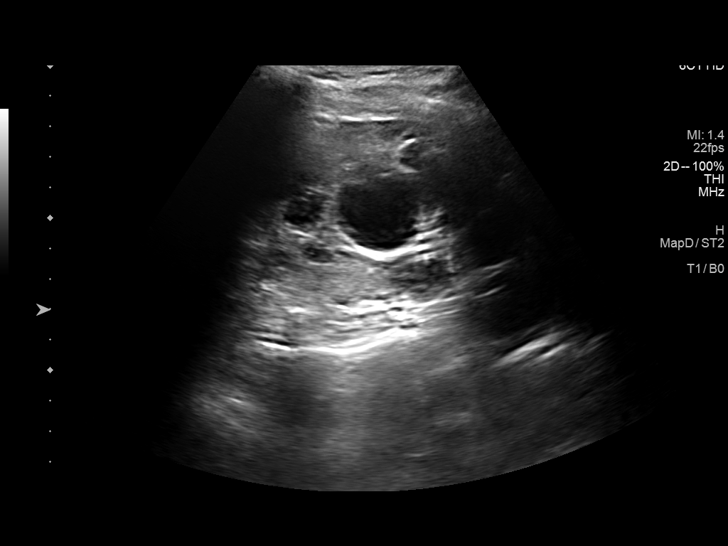
[im 20/78]
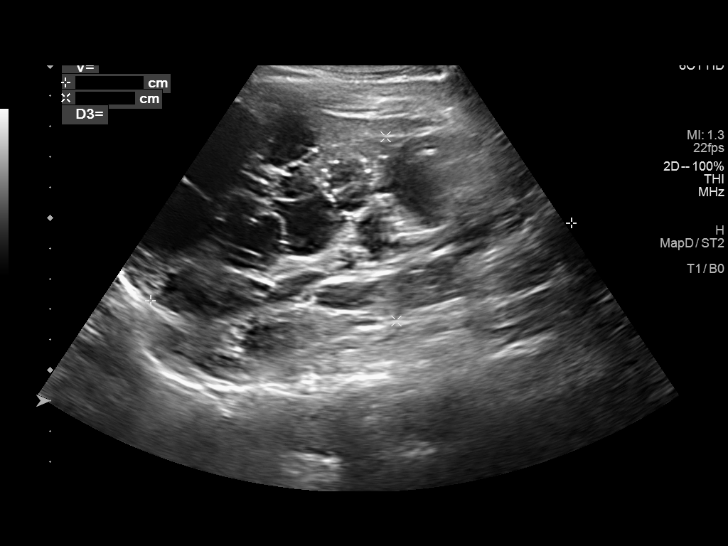
[im 26/78]
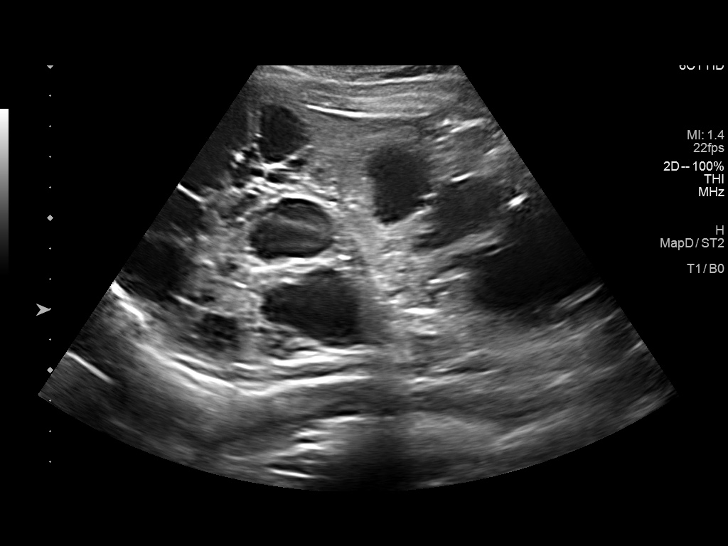
[im 33/78]
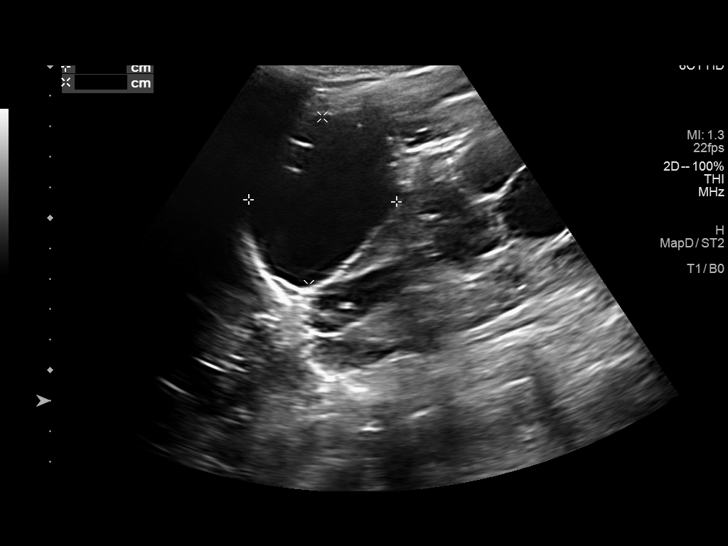
[im 39/78]
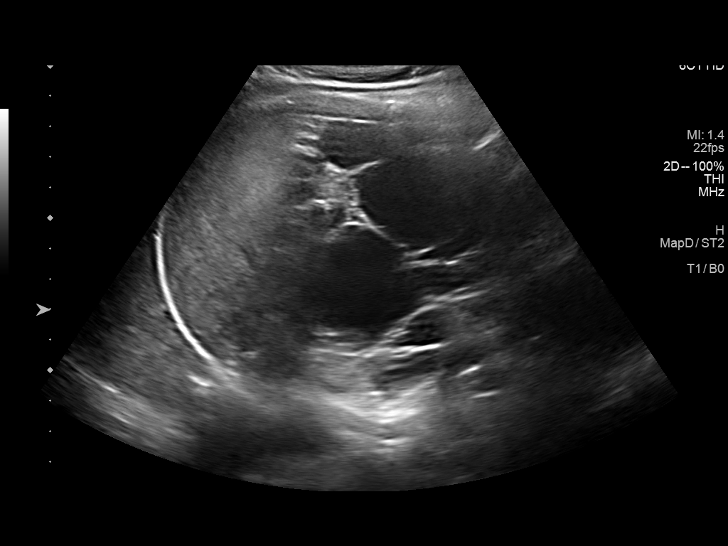
[im 45/78]
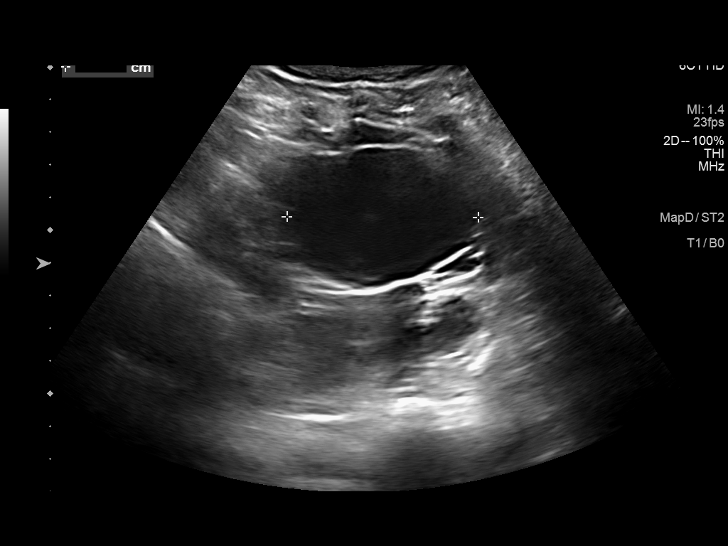
[im 52/78]
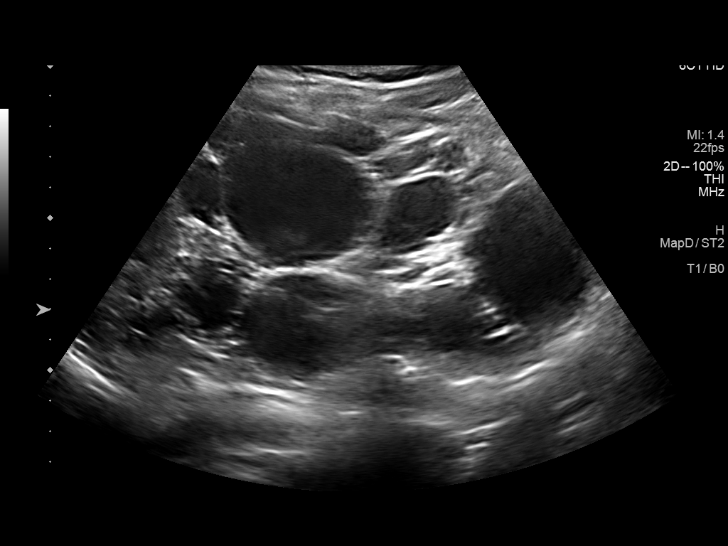
[im 58/78]
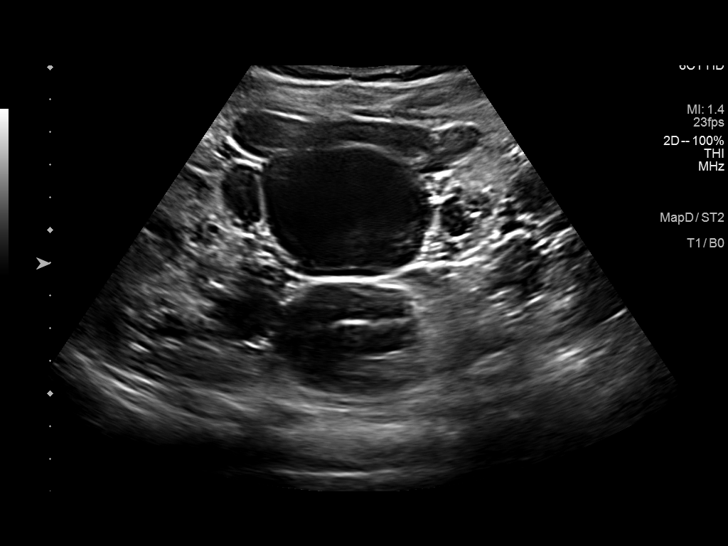
[im 65/78]
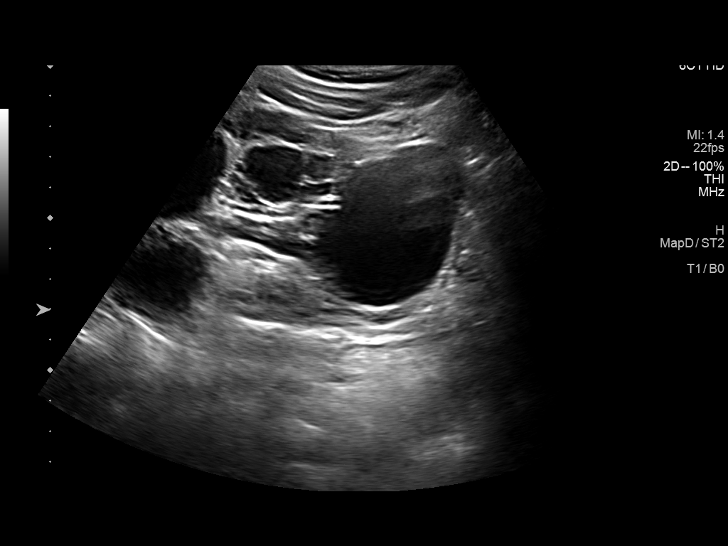
[im 71/78]
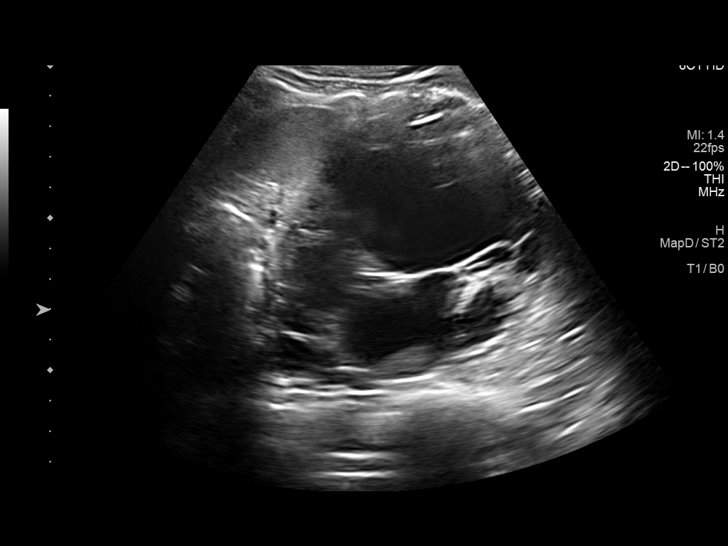
[im 78/78]
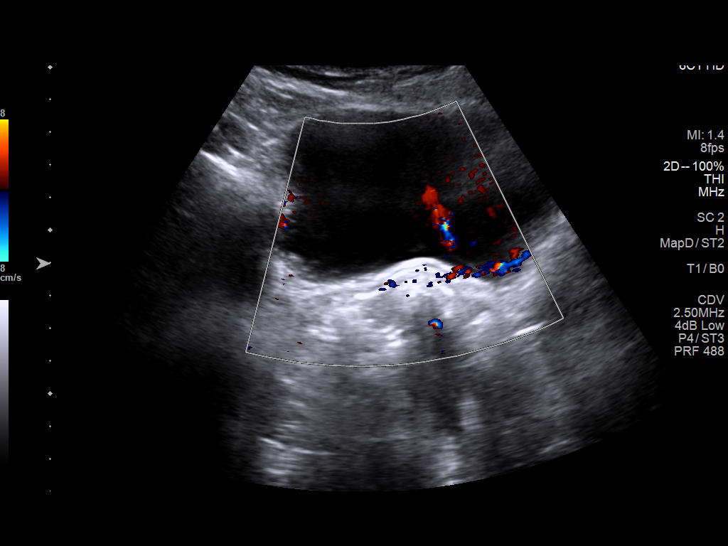

[13 of 25 positions shown; findings below may reference images not displayed]

FINDINGS: Right Kidney:

Renal measurements: 14.1 x 6.1 x 6.5 cm = volume: 291.1 mL.
Innumerable cysts seen largely replacing the right kidney. Largest
of these measures 7.3 x 5.0 x 7.3 cm at the lower pole. Cysts are
largely simple to perhaps minimally complex in appearance. No solid
or suspicious lesions. Increased echogenicity seen within the
visualized renal parenchyma. No nephrolithiasis or hydronephrosis.

Left Kidney:

Renal measurements: 16.1 x 8.3 x 8.9 cm = volume: 618.4 mL.
Innumerable cysts seen largely replacing the left kidney. Largest of
these measures 5.1 x 4.3 x 5.9 cm at the lower pole. Cysts are
predominantly simple to minimally complex in appearance. No solid or
concerning mass lesion identified. Increased echogenicity seen
within the visualized renal parenchyma. No nephrolithiasis or
hydronephrosis.

Bladder:

Appears normal for degree of bladder distention. Bilateral jets are
visualized.

Other:

Multiple scattered cysts noted within the liver, largest of which
measures 5.9 cm.
IMPRESSION: 1. Enlarged multi cystic kidneys as above, consistent with history
of polycystic kidney disease. No solid or suspicious renal mass. No
hydronephrosis.
2. Increased echogenicity within the visible renal parenchyma,
suggesting underlying medical renal disease.
3. Multiple hepatic cysts, also consistent with ADPCKD.

## 2019-10-26 ENCOUNTER — Encounter: Payer: Self-pay | Admitting: Family Medicine

## 2019-12-05 ENCOUNTER — Encounter: Payer: Self-pay | Admitting: Family Medicine

## 2019-12-05 ENCOUNTER — Other Ambulatory Visit: Payer: Self-pay

## 2019-12-05 ENCOUNTER — Ambulatory Visit (INDEPENDENT_AMBULATORY_CARE_PROVIDER_SITE_OTHER): Payer: 59 | Admitting: Family Medicine

## 2019-12-05 DIAGNOSIS — M79674 Pain in right toe(s): Secondary | ICD-10-CM | POA: Diagnosis not present

## 2019-12-05 DIAGNOSIS — M25561 Pain in right knee: Secondary | ICD-10-CM | POA: Diagnosis not present

## 2019-12-05 NOTE — Progress Notes (Signed)
Office Visit Note   Patient: Judy Manning           Date of Birth: 1965/08/27           MRN: [id number] Visit Date: 12/05/2019 Requested by: Eunice Blase, MD [address] [place],  [place] [address] PCP: Eunice Blase, MD  Subjective: Chief Complaint  Patient presents with  . Right Knee - Pain    Pain medial aspect of knee x 1 week. Noticed it after standing up from sitting cross-legged in the floor.  . Right Great Toe - Pain    Pain x approximately 1 week - NKI.    HPI: She is here with right knee and right great toe pain.  Both pains started about a week ago, no injury.  When she is at home she generally sits on the floor while using her computer, sitting with her legs crossed under her.  She has noticed that her knee hurts on the medial aspect when standing up.  Once she is up, the pain seems to go away.  No locking or giving way, no swelling.  Her great toe has been hurting underneath the toenail.  She checked under the nail and there was no sign of ingrown toenail, which she has had in the past.  No swelling or redness.  She cannot take NSAIDs due to her kidneys.              ROS:   All other systems were reviewed and are negative.  Objective: Vital Signs: There were no vitals taken for this visit.  Physical Exam:  General:  Alert and oriented, in no acute distress. Pulm:  Breathing unlabored. Psy:  Normal mood, congruent affect. Skin: No rash or erythema. Right knee: No significant patellofemoral crepitus.  No effusion.  Full flexion and extension, no laxity with varus or valgus stress, Lachman's is solid.  She is tender on the posterior medial joint line and has pain but no palpable click with McMurray's. Right great toe: No swelling or tenderness at the MTP or IP joint.  Good range of motion of both joints.  I cannot reproduce pain by palpation, but the pain is located directly underneath the toenail.  Imaging: No results found.  Assessment & Plan: 1.  Right  medial knee pain, concerning for medial meniscus injury. -She will avoid sitting on the floor for the next couple weeks until pain subsides.  Ice applications as needed.  If pain persist, could contemplate a one-time cortisone injection or possibly dextrose prolotherapy.  2.  Right great toe distal phalanx pain, etiology uncertain. -Trial of ice applications.  X-rays if symptoms persist.      Procedures: No procedures performed  No notes on file     PMFS History: Patient Active Problem List   Diagnosis Date Noted  . History of basal cell carcinoma (BCC) 03/23/2018  . 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 Other fatigue 03/23/2018  . Ganglion cyst 10/31/2017  . Bilateral carpal tunnel syndrome 06/18/2017  . Recurrent UTI 07/30/2013  . Migraine 05/06/2013  . Polycystic kidney disease 05/06/2013   No past medical history on file.  Family History  Problem Relation Age of Onset  . Rheum arthritis Mother   . Heart disease Mother        Pacemaker  . Stroke Father   . Polycystic kidney disease Father   . Skin cancer Father   . Cancer Father   . Arthritis Brother   . Alzheimer's disease  Maternal Grandmother 48  . Diabetes Paternal Grandfather   . Skin cancer Paternal Grandfather   . Cancer Paternal Grandfather   . Breast cancer Neg Hx   . Colon cancer Neg Hx     Past Surgical History:  Procedure Laterality Date  . AUGMENTATION MAMMAPLASTY     Social History   Occupational History  . Not on file  Tobacco Use  . Smoking status: Never Smoker  . Smokeless tobacco: Never Used  Substance and Sexual Activity  . Alcohol use: Not Currently    Comment: rarely ever  . Drug use: Never  . Sexual activity: Not on file

## 2019-12-11 ENCOUNTER — Encounter: Payer: Self-pay | Admitting: Family Medicine

## 2019-12-16 ENCOUNTER — Ambulatory Visit: Payer: 59 | Admitting: Family Medicine

## 2019-12-16 ENCOUNTER — Other Ambulatory Visit: Payer: Self-pay

## 2019-12-16 DIAGNOSIS — M25561 Pain in right knee: Secondary | ICD-10-CM

## 2019-12-16 NOTE — Progress Notes (Signed)
   Office Visit Note   Patient: Judy Manning           Date of Birth: 03-Mar-1966           MRN: [id number] Visit Date: 12/16/2019 Requested by: Eunice Blase, MD [address] [place],  [place] [address] PCP: Eunice Blase, MD  Subjective: Chief Complaint  Patient presents with  . Right Knee - Pain    Knee has been throbbing since last visit. The pain has worsened. Would like cortisone injection.    HPI: She is here with persistent right knee pain.  Ongoing pain on the medial aspect, no locking or giving way.  Leaving in a couple weeks for vacation.  She would like to try an injection.              ROS:   All other systems were reviewed and are negative.  Objective: Vital Signs: There were no vitals taken for this visit.  Physical Exam:  General:  Alert and oriented, in no acute distress. Pulm:  Breathing unlabored. Psy:  Normal mood, congruent affect.  Right knee: Trace effusion with no warmth.  Still tender on the medial joint line, no palpable click with McMurray's.  Full range of motion.  Imaging: No results found.  Assessment & Plan: 1.  Right knee pain, suspicious for medial meniscus injury -Steroid injection today.  Follow-up as needed.     Procedures: Right knee steroid injection: After sterile prep with Betadine, injected 3 cc 1% lidocaine without epinephrine and 40 mg methylprednisolone from superolateral approach.    PMFS History: Patient Active Problem List   Diagnosis Date Noted  . Endometrial cells on cervical Pap smear inconsistent w/LMP 09/18/2018  . Fibroid 09/18/2018  . Menorrhagia with irregular cycle 09/18/2018  . History of basal cell carcinoma (BCC) 03/23/2018  . 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 Other fatigue 03/23/2018  . Ganglion cyst 10/31/2017  . Bilateral carpal tunnel syndrome 06/18/2017  . Recurrent UTI 07/30/2013  . Migraine 05/06/2013  . Polycystic kidney disease 05/06/2013   No past medical  history on file.  Family History  Problem Relation Age of Onset  . Rheum arthritis Mother   . Heart disease Mother        Pacemaker  . Stroke Father   . Polycystic kidney disease Father   . Skin cancer Father   . Cancer Father   . Arthritis Brother   . Alzheimer's disease Maternal Grandmother 33  . Diabetes Paternal Grandfather   . Skin cancer Paternal Grandfather   . Cancer Paternal Grandfather   . Breast cancer Neg Hx   . Colon cancer Neg Hx     Past Surgical History:  Procedure Laterality Date  . AUGMENTATION MAMMAPLASTY     Social History   Occupational History  . Not on file  Tobacco Use  . Smoking status: Never Smoker  . Smokeless tobacco: Never Used  Substance and Sexual Activity  . Alcohol use: Not Currently    Comment: rarely ever  . Drug use: Never  . Sexual activity: Not on file

## 2019-12-19 ENCOUNTER — Encounter: Payer: Self-pay | Admitting: Family Medicine

## 2019-12-27 ENCOUNTER — Other Ambulatory Visit (INDEPENDENT_AMBULATORY_CARE_PROVIDER_SITE_OTHER): Payer: Self-pay | Admitting: Family Medicine

## 2020-01-31 ENCOUNTER — Encounter: Payer: Self-pay | Admitting: Family Medicine

## 2020-01-31 MED ORDER — POLYMYXIN B-TRIMETHOPRIM 10000-0.1 UNIT/ML-% OP SOLN: 1.0000 [drp] | Freq: Four times a day (QID) | OPHTHALMIC | 0 refills | Status: DC

## 2020-02-06 ENCOUNTER — Encounter: Payer: Self-pay | Admitting: Family Medicine

## 2020-02-07 ENCOUNTER — Other Ambulatory Visit: Payer: Self-pay | Admitting: Family Medicine

## 2020-02-07 MED ORDER — GABAPENTIN 300 MG PO CAPS: 300.0000 mg | ORAL_CAPSULE | Freq: Two times a day (BID) | ORAL | 3 refills | Status: DC

## 2020-02-14 ENCOUNTER — Other Ambulatory Visit: Payer: Self-pay | Admitting: Family Medicine

## 2020-02-14 MED ORDER — GABAPENTIN 300 MG PO CAPS
300.0000 mg | ORAL_CAPSULE | Freq: Two times a day (BID) | ORAL | 3 refills | Status: DC
Start: 2020-02-14 — End: 2020-12-29

## 2020-02-18 ENCOUNTER — Other Ambulatory Visit: Payer: Self-pay | Admitting: Nephrology

## 2020-02-18 DIAGNOSIS — N1832 Chronic kidney disease, stage 3b: Secondary | ICD-10-CM

## 2020-02-26 ENCOUNTER — Other Ambulatory Visit: Payer: 59

## 2020-03-27 ENCOUNTER — Other Ambulatory Visit: Payer: Self-pay | Admitting: Family Medicine

## 2020-04-13 ENCOUNTER — Encounter: Payer: Self-pay | Admitting: Family Medicine

## 2020-04-13 MED ORDER — ALPRAZOLAM 0.25 MG PO TABS: 0.2500 mg | ORAL_TABLET | Freq: Two times a day (BID) | ORAL | 0 refills | Status: DC | PRN

## 2020-06-16 ENCOUNTER — Other Ambulatory Visit: Payer: Self-pay | Admitting: Family Medicine

## 2020-06-17 ENCOUNTER — Encounter: Payer: Self-pay | Admitting: Family Medicine

## 2020-06-17 ENCOUNTER — Other Ambulatory Visit: Payer: Self-pay

## 2020-06-17 ENCOUNTER — Ambulatory Visit (INDEPENDENT_AMBULATORY_CARE_PROVIDER_SITE_OTHER): Payer: 59 | Admitting: Family Medicine

## 2020-06-17 VITALS — BP 104/69 | HR 74 | Ht 60.5 in | Wt 115.0 lb

## 2020-06-17 DIAGNOSIS — J3489 Other specified disorders of nose and nasal sinuses: Secondary | ICD-10-CM

## 2020-06-17 DIAGNOSIS — G43809 Other migraine, not intractable, without status migrainosus: Secondary | ICD-10-CM

## 2020-06-17 DIAGNOSIS — R0981 Nasal congestion: Secondary | ICD-10-CM

## 2020-06-17 DIAGNOSIS — E7849 Other hyperlipidemia: Secondary | ICD-10-CM | POA: Diagnosis not present

## 2020-06-17 DIAGNOSIS — Z0000 Encounter for general adult medical examination without abnormal findings: Secondary | ICD-10-CM

## 2020-06-17 DIAGNOSIS — R768 Other specified abnormal immunological findings in serum: Secondary | ICD-10-CM | POA: Insufficient documentation

## 2020-06-17 DIAGNOSIS — Q613 Polycystic kidney, unspecified: Secondary | ICD-10-CM | POA: Diagnosis not present

## 2020-06-17 DIAGNOSIS — Z85828 Personal history of other malignant neoplasm of skin: Secondary | ICD-10-CM | POA: Diagnosis not present

## 2020-06-17 DIAGNOSIS — Z8639 Personal history of other endocrine, nutritional and metabolic disease: Secondary | ICD-10-CM

## 2020-06-17 MED ORDER — SUMATRIPTAN SUCCINATE 100 MG PO TABS
100.0000 mg | ORAL_TABLET | ORAL | 11 refills | Status: DC | PRN
Start: 2020-06-17 — End: 2021-03-30

## 2020-06-17 NOTE — Progress Notes (Signed)
Office Visit Note   Patient: Judy Manning           Date of Birth: 07-30-1966           MRN: [id number] Visit Date: 06/17/2020 Requested by: Eunice Blase, MD [address] [place],  [place] [address] PCP: Eunice Blase, MD  Subjective: Chief Complaint  Patient presents with  . Annual Exam    HPI: She is here for a wellness exam.  She is doing well from a polycystic kidney disease and chronic kidney disease standpoint.  Numbers are stable, she is monitored by Dr. Justin Mend.  She is she has had some breakthrough migraines lately that are not responding to increased dosage of gabapentin.  This happens 1 or 2 times per month.  She tried her husband's Imitrex and it does help.  She would like a prescription for that.  Her left knee continues to bother her intermittently but not enough to warrant surgery.  She has had 2 dental implants in the right upper teeth.  She continues to have pain even after having a root canal.  Her oral surgeon has not been able to figure out what is causing her pain.  She has hyperlipidemia not requiring medication.  She does have a family history of heart disease.  She has a history of basal cell cancer and is monitored by dermatology on a regular basis.  She has a history of vitamin D deficiency and positive rheumatoid factor.  Family history was updated.  She is up-to-date on health maintenance issues other than colon cancer screening.                 ROS:   All other systems were reviewed and are negative.  Objective: Vital Signs: BP 104/69   Pulse 74   Ht 5' 0.5" (1.537 m)   Wt 115 lb (52.2 kg)   BMI 22.09 kg/m   Physical Exam:  General:  Alert and oriented, in no acute distress. Pulm:  Breathing unlabored. Psy:  Normal mood, congruent affect. Skin: No suspicious lesions HEENT:  /AT, PERRLA, EOM Full, no nystagmus.  Funduscopic examination within normal limits.  No conjunctival erythema.  Tympanic membranes are pearly gray with  normal landmarks.  External ear canals are normal.  Nasal passages are clear.  Oropharynx is clear.  No significant lymphadenopathy.  No thyromegaly or nodules.  2+ carotid pulses without bruits. CV: Regular rate and rhythm without murmurs, rubs, or gallops.  No peripheral edema.  2+ radial and posterior tibial pulses. Lungs: Clear to auscultation throughout with no wheezing or areas of consolidation. Abd: Bowel sounds are active, no hepatosplenomegaly or masses.  Soft and nontender.  No audible bruits.  No evidence of ascites. Extremities: 2-3+ upper and lower DTRs.  No nail deformities.   Imaging: No results found.  Assessment & Plan: 1.  Wellness examination -Labs to monitor.  Follow-up yearly. -Cologuard for colon cancer screening.  2.  Chronic right sided maxillary sinus pain and pressure -Sinus CT to further evaluate.  3.  Migraine headaches -Imitrex prescription given.  4.  Chronic kidney disease with polycystic kidneys, monitored by nephrology.  5.  History of hyperlipidemia -Labs to evaluate.  Consider CT calcium score to further assess cardiac risk.  6.  History of vitamin D deficiency -Recheck today.    Procedures: No procedures performed  No notes on file     PMFS History: Patient Active Problem List   Diagnosis Date Noted  . Other hyperlipidemia 06/17/2020  .  History of vitamin D deficiency 06/17/2020  . Rheumatoid factor positive 06/17/2020  . Endometrial cells on cervical Pap smear inconsistent w/LMP 09/18/2018  . Fibroid 09/18/2018  . Menorrhagia with irregular cycle 09/18/2018  . History of basal cell carcinoma (BCC) 03/23/2018  . Polyarthritis with positive rheumatoid factor ([facility]) 03/23/2018  . Other fatigue 03/23/2018  . Ganglion cyst 10/31/2017  . Bilateral carpal tunnel syndrome 06/18/2017  . Recurrent UTI 07/30/2013  . Migraine 05/06/2013  . Polycystic kidney disease 05/06/2013   History reviewed. No pertinent past medical history.  Family  History  Problem Relation Age of Onset  . Rheum arthritis Mother   . Heart disease Mother        Pacemaker  . COPD Mother        Smoker  . Skin cancer Mother   . Cancer Mother   . Stroke Father   . Polycystic kidney disease Father   . Arthritis Brother   . Alzheimer's disease Maternal Grandmother 27  . Diabetes Paternal Grandfather   . Skin cancer Paternal Grandfather   . Cancer Paternal Grandfather   . Breast cancer Neg Hx   . Colon cancer Neg Hx     Past Surgical History:  Procedure Laterality Date  . AUGMENTATION MAMMAPLASTY     Social History   Occupational History  . Not on file  Tobacco Use  . Smoking status: Never Smoker  . Smokeless tobacco: Never Used  Substance and Sexual Activity  . Alcohol use: Not Currently    Comment: rarely ever  . Drug use: Never  . Sexual activity: Not on file

## 2020-06-18 ENCOUNTER — Telehealth: Payer: Self-pay | Admitting: Family Medicine

## 2020-06-18 ENCOUNTER — Encounter: Payer: Self-pay | Admitting: Family Medicine

## 2020-06-18 DIAGNOSIS — E7849 Other hyperlipidemia: Secondary | ICD-10-CM

## 2020-06-18 LAB — CBC WITH DIFFERENTIAL/PLATELET
Absolute Monocytes: 302 cells/uL (ref 200–950)
Basophils Absolute: 31 cells/uL (ref 0–200)
Basophils Relative: 1.1 %
Eosinophils Absolute: 81 cells/uL (ref 15–500)
Eosinophils Relative: 2.9 %
HCT: 36.2 % (ref 35.0–45.0)
Hemoglobin: 12.2 g/dL (ref 11.7–15.5)
Lymphs Abs: 655 cells/uL — ABNORMAL LOW (ref 850–3900)
MCH: 28.5 pg (ref 27.0–33.0)
MCHC: 33.7 g/dL (ref 32.0–36.0)
MCV: 84.6 fL (ref 80.0–100.0)
MPV: 11 fL (ref 7.5–12.5)
Monocytes Relative: 10.8 %
Neutro Abs: 1730 cells/uL (ref 1500–7800)
Neutrophils Relative %: 61.8 %
Platelets: 205 10*3/uL (ref 140–400)
RBC: 4.28 10*6/uL (ref 3.80–5.10)
RDW: 12.6 % (ref 11.0–15.0)
Total Lymphocyte: 23.4 %
WBC: 2.8 10*3/uL — ABNORMAL LOW (ref 3.8–10.8)

## 2020-06-18 LAB — THYROID PANEL WITH TSH
Free Thyroxine Index: 1 — ABNORMAL LOW (ref 1.4–3.8)
T3 Uptake: 43 % — ABNORMAL HIGH (ref 22–35)
T4, Total: 2.4 ug/dL — ABNORMAL LOW (ref 5.1–11.9)
TSH: 1.76 mIU/L

## 2020-06-18 LAB — LIPID PANEL
Cholesterol: 263 mg/dL — ABNORMAL HIGH (ref <200)
HDL: 78 mg/dL (ref >=50)
LDL Cholesterol (Calc): 166 mg/dL (calc) — ABNORMAL HIGH
Non-HDL Cholesterol (Calc): 185 mg/dL (calc) — ABNORMAL HIGH (ref <130)
Total CHOL/HDL Ratio: 3.4 (calc) (ref <5.0)
Triglycerides: 84 mg/dL (ref <150)

## 2020-06-18 LAB — HIGH SENSITIVITY CRP: hs-CRP: <0.3 mg/L

## 2020-06-18 LAB — VITAMIN D 25 HYDROXY (VIT D DEFICIENCY, FRACTURES): Vit D, 25-Hydroxy: 78 ng/mL (ref 30–100)

## 2020-06-18 NOTE — Telephone Encounter (Signed)
Labs are notable for the following:  C-reactive protein is normal.  Vitamin D looks perfect at 78.  Thyroid TSH looks good, but other thyroid numbers remain abnormal.  Here's some helpful advice: [employer]  Lipids are elevated again, though the HDL and triglycerides look great.  The dietary information I sent to your daughter would be worth considering.  Also at some point it would be worthwhile to consider a CT Calcium Score test.  Insurance doesn't cover, but it's pretty reasonable at around $100.  It gives a better idea whether there's any plaque buildup.  WBC count is low again.  Other CBC numbers look good.  Not sure what is causing this.  We should recheck in a few months.  Since you've otherwise remained healthy, it's probably nothing to worry about.

## 2020-06-19 NOTE — Addendum Note (Signed)
Addended by: Hortencia Pilar on: 06/19/2020 08:08 AM   Modules accepted: Orders

## 2020-06-22 ENCOUNTER — Encounter: Payer: Self-pay | Admitting: Family Medicine

## 2020-06-22 ENCOUNTER — Other Ambulatory Visit: Payer: Self-pay

## 2020-06-22 ENCOUNTER — Ambulatory Visit: Payer: 59 | Admitting: Family Medicine

## 2020-06-22 DIAGNOSIS — G5602 Carpal tunnel syndrome, left upper limb: Secondary | ICD-10-CM

## 2020-06-22 DIAGNOSIS — G5601 Carpal tunnel syndrome, right upper limb: Secondary | ICD-10-CM | POA: Diagnosis not present

## 2020-06-22 NOTE — Progress Notes (Signed)
Office Visit Note   Patient: Judy Manning           Date of Birth: [birth year]           MRN: [id number] Visit Date: 06/22/2020 Requested by: Eunice Blase, MD [address] [place],  [place] [address] PCP: Eunice Blase, MD  Subjective: Chief Complaint  Patient presents with  . Left Wrist - Pain, Numbness    Left wrist pain, with numbness mainly in the 5th finger - constant now. Request cortisone injection.  . Right Wrist - Pain, Numbness    Pain with numbness mainly in fingers 3 & 4. Requests cortisone injection.    HPI: She is here with bilateral hand numbness and pain.  History of carpal tunnel syndrome.  The last injection was her left wrist in February and she felt well until a month or 2 ago.  Now both of them are bothering her, keeping her awake at night.  She would like bilateral injections.  She has been dealing with a lot of stress related to her daughter getting ready to start medical school and leave home next year, along with caring for her mother who lives next to her and is not doing well from a health standpoint.                ROS:   All other systems were reviewed and are negative.  Objective: Vital Signs: There were no vitals taken for this visit.  Physical Exam:  General:  Alert and oriented, in no acute distress. Pulm:  Breathing unlabored. Psy:  Normal mood, congruent affect.  Hands: She has no thenar atrophy.  Bilateral positive Tinel's at the carpal tunnel.  Imaging: No results found.  Assessment & Plan: 1.  Bilateral carpal tunnel syndrome -Injection in each wrist today.  Follow-up as needed.     Procedures: Bilateral carpal tunnel injections: After sterile prep with Betadine, injected 1 cc 1% lidocaine without epinephrine and 40 mg methylprednisolone into each wrist.    PMFS History: Patient Active Problem List   Diagnosis Date Noted  . Other hyperlipidemia 06/17/2020  . History of vitamin D deficiency 06/17/2020  . Rheumatoid  factor positive 06/17/2020  . Endometrial cells on cervical Pap smear inconsistent w/LMP 09/18/2018  . Fibroid 09/18/2018  . Menorrhagia with irregular cycle 09/18/2018  . History of basal cell carcinoma (BCC) 03/23/2018  . Polyarthritis with positive rheumatoid factor ([facility]) 03/23/2018  . Other fatigue 03/23/2018  . Ganglion cyst 10/31/2017  . Bilateral carpal tunnel syndrome 06/18/2017  . Recurrent UTI 07/30/2013  . Migraine 05/06/2013  . Polycystic kidney disease 05/06/2013   History reviewed. No pertinent past medical history.  Family History  Problem Relation Age of Onset  . Rheum arthritis Mother   . Heart disease Mother        Pacemaker  . COPD Mother        Smoker  . Skin cancer Mother   . Cancer Mother   . Stroke Father   . Polycystic kidney disease Father   . Arthritis Brother   . Alzheimer's disease Maternal Grandmother 77  . Diabetes Paternal Grandfather   . Skin cancer Paternal Grandfather   . Cancer Paternal Grandfather   . Breast cancer Neg Hx   . Colon cancer Neg Hx     Past Surgical History:  Procedure Laterality Date  . AUGMENTATION MAMMAPLASTY     Social History   Occupational History  . Not on file  Tobacco Use  . Smoking status:  Never Smoker  . Smokeless tobacco: Never Used  Substance and Sexual Activity  . Alcohol use: Not Currently    Comment: rarely ever  . Drug use: Never  . Sexual activity: Not on file

## 2020-07-08 ENCOUNTER — Inpatient Hospital Stay: Admission: RE | Admit: 2020-07-08 | Payer: 59 | Source: Ambulatory Visit

## 2020-08-04 ENCOUNTER — Telehealth: Payer: Self-pay | Admitting: Family Medicine

## 2020-08-04 NOTE — Telephone Encounter (Signed)
CT calcium score is zero.

## 2020-09-16 ENCOUNTER — Encounter: Payer: Self-pay | Admitting: Family Medicine

## 2020-09-18 MED ORDER — DAPSONE 5 % EX GEL: 1.0000 "application " | Freq: Two times a day (BID) | CUTANEOUS | 11 refills | Status: DC

## 2020-09-24 ENCOUNTER — Encounter: Payer: Self-pay | Admitting: Family Medicine

## 2020-09-24 MED ORDER — PREDNISONE 10 MG PO TABS: ORAL_TABLET | ORAL | 0 refills | Status: DC

## 2020-10-07 ENCOUNTER — Other Ambulatory Visit: Payer: Self-pay | Admitting: Nephrology

## 2020-10-07 DIAGNOSIS — Q613 Polycystic kidney, unspecified: Secondary | ICD-10-CM

## 2020-10-07 DIAGNOSIS — N183 Chronic kidney disease, stage 3 unspecified: Secondary | ICD-10-CM

## 2020-10-11 ENCOUNTER — Other Ambulatory Visit: Payer: Self-pay | Admitting: Family Medicine

## 2020-10-12 ENCOUNTER — Encounter: Payer: Self-pay | Admitting: Family Medicine

## 2020-10-12 DIAGNOSIS — R768 Other specified abnormal immunological findings in serum: Secondary | ICD-10-CM

## 2020-10-12 DIAGNOSIS — M0580 Other rheumatoid arthritis with rheumatoid factor of unspecified site: Secondary | ICD-10-CM

## 2020-10-12 DIAGNOSIS — G5601 Carpal tunnel syndrome, right upper limb: Secondary | ICD-10-CM

## 2020-10-12 DIAGNOSIS — G5602 Carpal tunnel syndrome, left upper limb: Secondary | ICD-10-CM

## 2020-10-12 MED ORDER — METRONIDAZOLE 500 MG PO TABS: 500.0000 mg | ORAL_TABLET | Freq: Three times a day (TID) | ORAL | 0 refills | Status: DC

## 2020-10-13 ENCOUNTER — Other Ambulatory Visit: Payer: Self-pay

## 2020-10-13 DIAGNOSIS — R768 Other specified abnormal immunological findings in serum: Secondary | ICD-10-CM

## 2020-10-13 DIAGNOSIS — M0580 Other rheumatoid arthritis with rheumatoid factor of unspecified site: Secondary | ICD-10-CM

## 2020-10-13 NOTE — Addendum Note (Signed)
Addended by: Hortencia Pilar on: 10/13/2020 05:02 PM   Modules accepted: Orders

## 2020-10-15 ENCOUNTER — Ambulatory Visit
Admission: RE | Admit: 2020-10-15 | Discharge: 2020-10-15 | Disposition: A | Discharge status: Home / Self Care | Payer: 59 | Source: Ambulatory Visit | Attending: Nephrology | Admitting: Nephrology

## 2020-10-15 DIAGNOSIS — Q613 Polycystic kidney, unspecified: Secondary | ICD-10-CM

## 2020-10-15 DIAGNOSIS — N183 Chronic kidney disease, stage 3 unspecified: Secondary | ICD-10-CM

## 2020-10-15 IMAGING — US US RENAL
1 series · 14 of 25 positions shown · non-contrast
Comparison: Renal ultrasound [DATE]

CLINICAL DATA: CKD, polycystic kidney disease

EXAM:
RENAL / URINARY TRACT ULTRASOUND COMPLETE

[Series 1: us renal · 0.20mm/px · 14 of 65 slices shown]
[im 1/65]
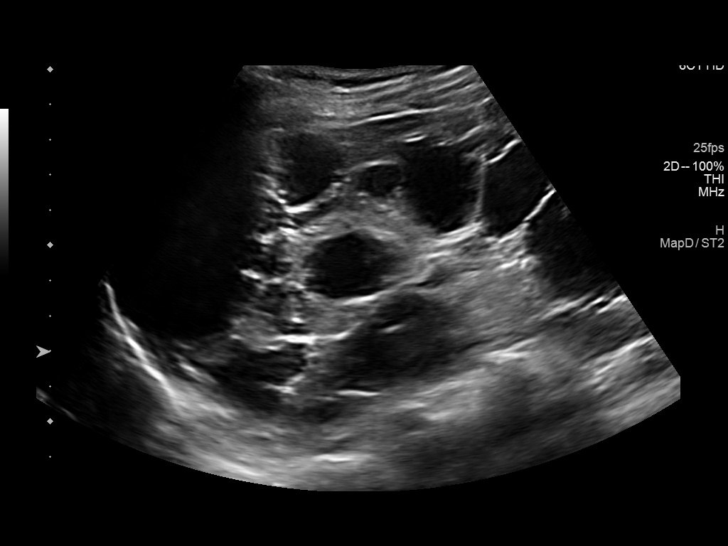
[im 6/65]
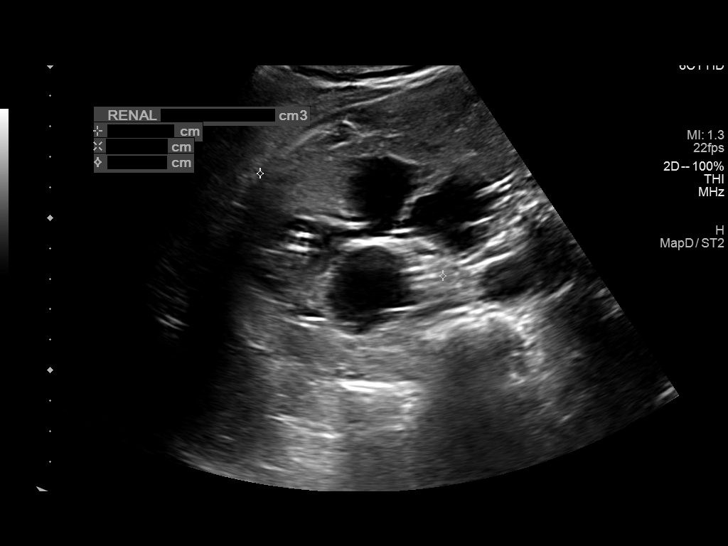
[im 11/65]
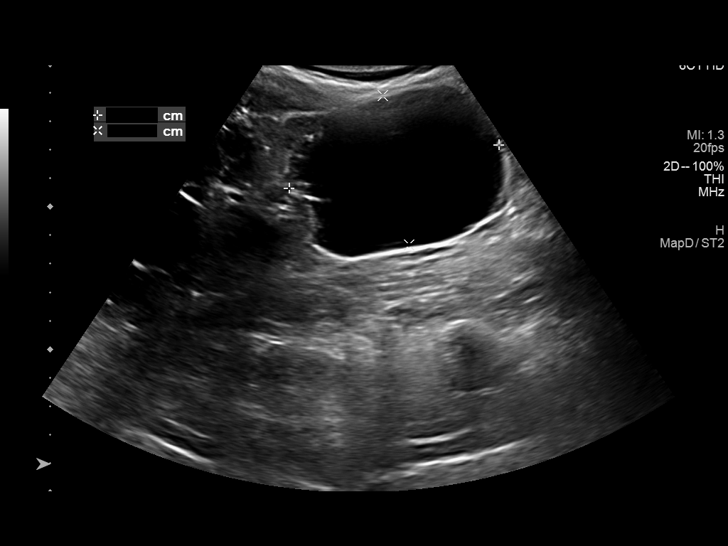
[im 17/65]
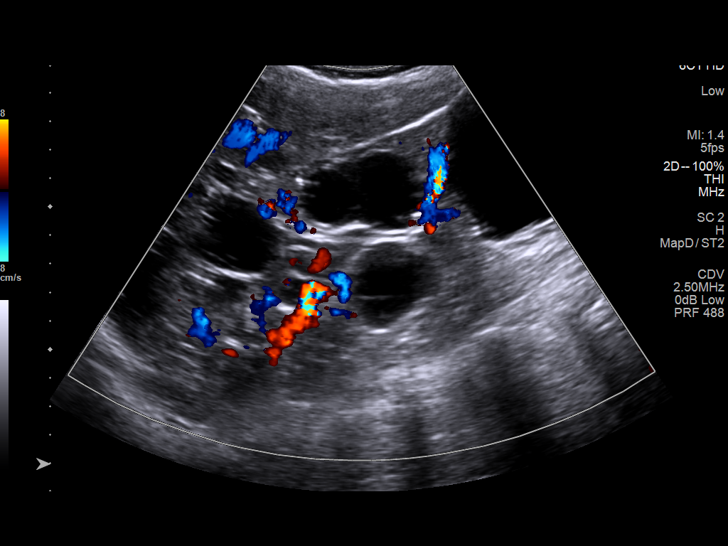
[im 22/65]
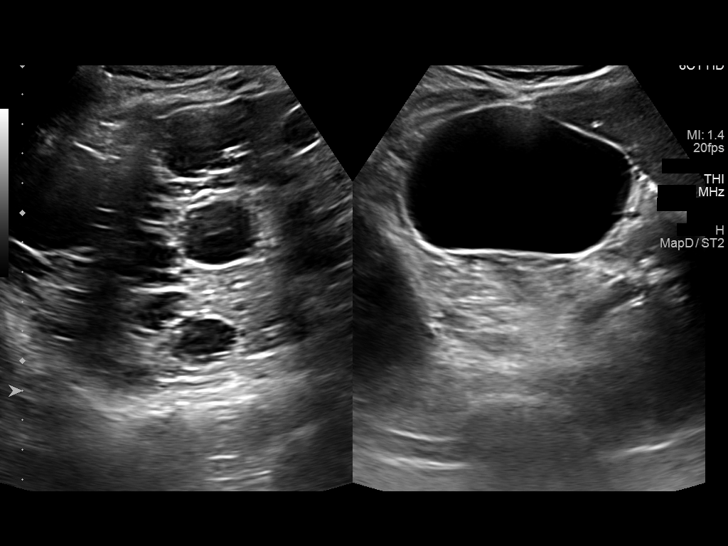
[im 25/65]
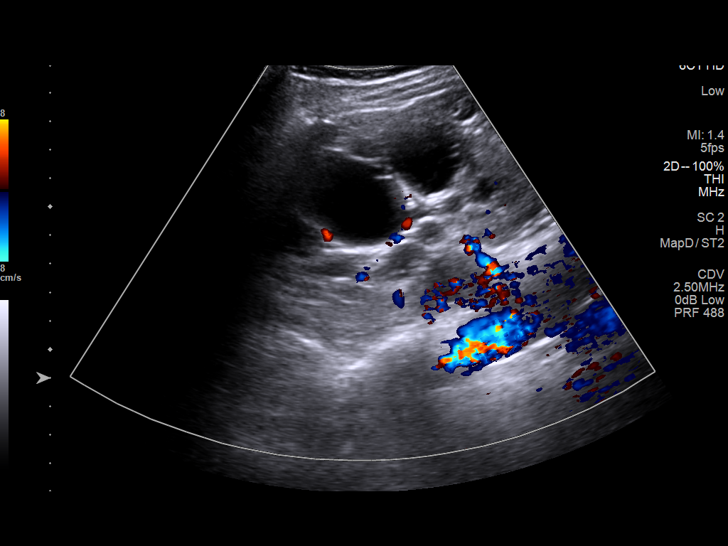
[im 30/65]
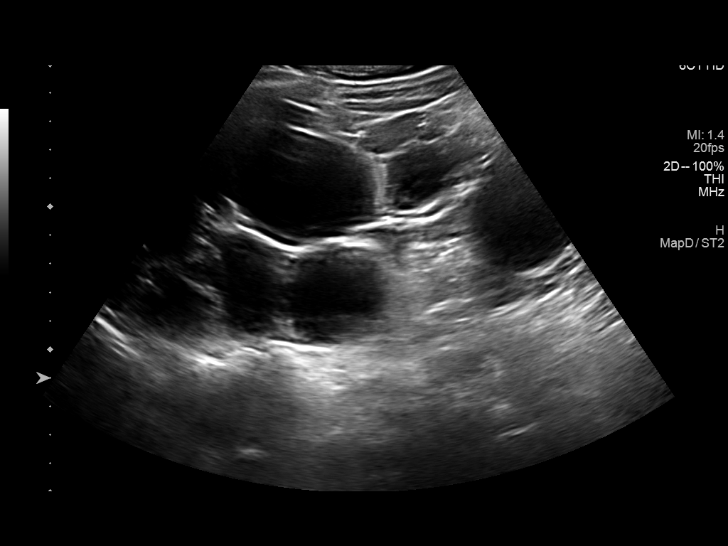
[im 35/65]
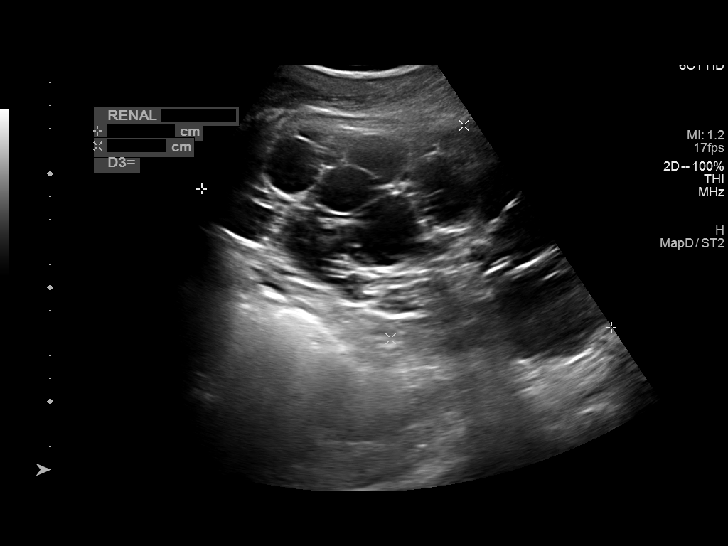
[im 41/65]
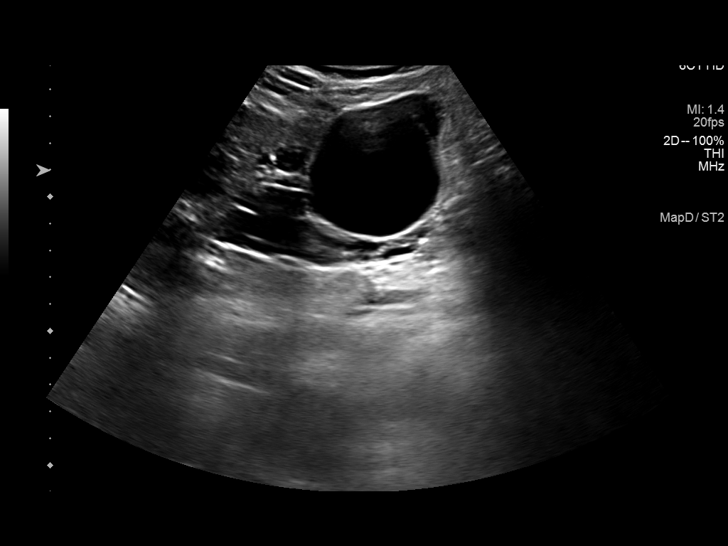
[im 43/65]
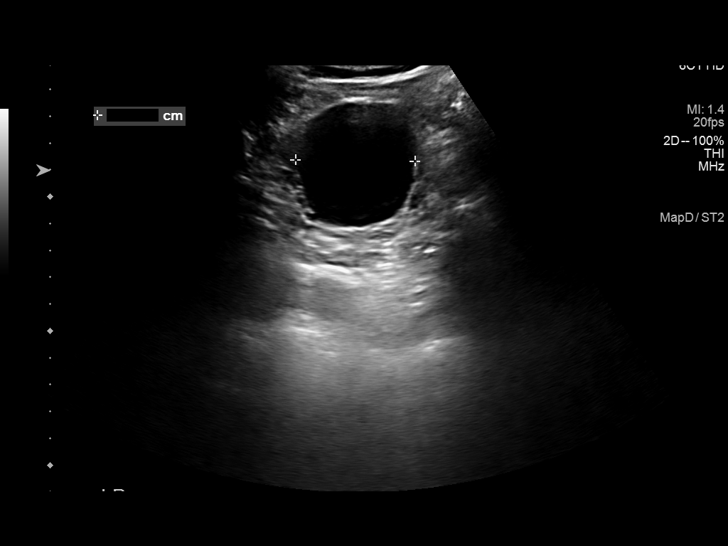
[im 49/65]
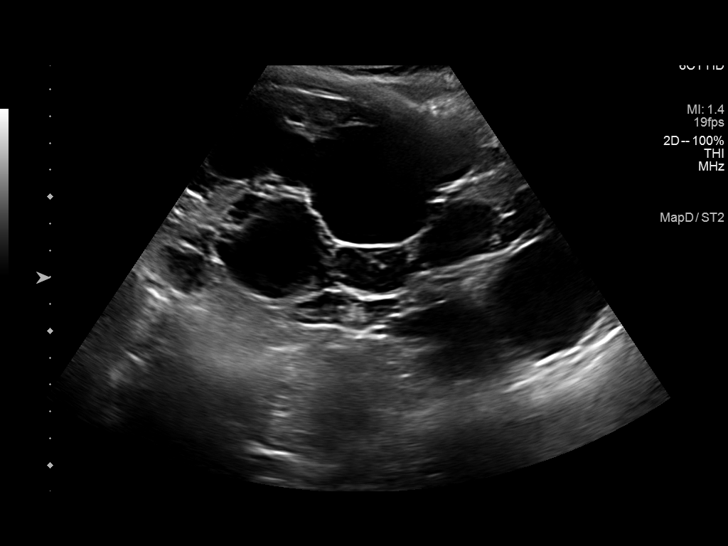
[im 54/65]
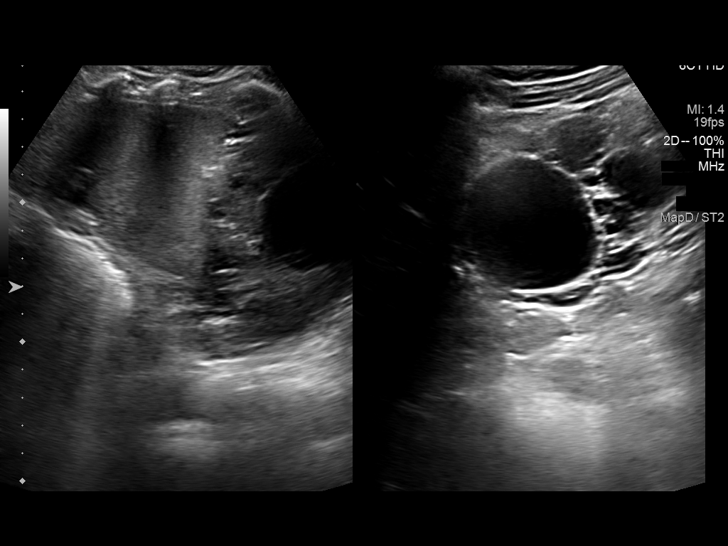
[im 59/65]
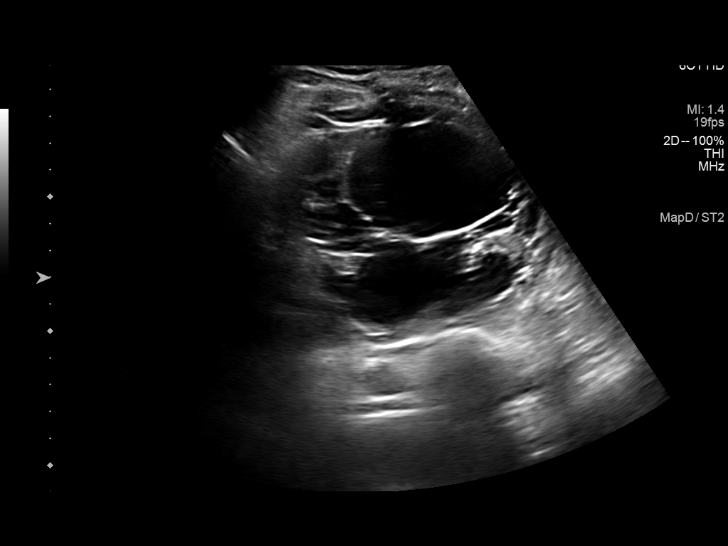
[im 65/65]
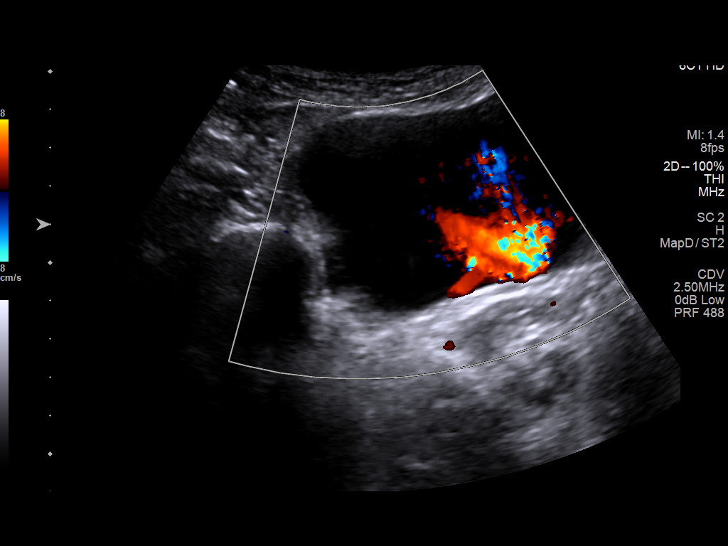

[14 of 25 positions shown; findings below may reference images not displayed]

FINDINGS: Right Kidney:

Renal measurements: 18.5 x 7.1 x 6.9 cm = volume: 469 mL. No
hydronephrosis. There are innumerable cysts throughout the right
kidney, largest measures 7.5 cm. No suspicious solid mass
identified.

Left Kidney:

Renal measurements: 19.0 x 9.9 x 8.7 cm = volume: 859 mL. No
hydronephrosis. There are innumerable cysts throughout the kidney
measuring up to 5.3 cm. No suspicious solid mass.

Bladder:

Appears normal for degree of bladder distention.

Other:

None.
IMPRESSION: Enlarged bilateral kidneys with innumerable cysts. No new suspicious
solid mass.

## 2020-10-16 ENCOUNTER — Telehealth: Payer: Self-pay | Admitting: Family Medicine

## 2020-10-16 NOTE — Telephone Encounter (Signed)
CCP antibodies still pending.  Others look good so far.  [place]

## 2020-10-17 ENCOUNTER — Telehealth: Payer: Self-pay | Admitting: Family Medicine

## 2020-10-17 LAB — C-REACTIVE PROTEIN: CRP: <1 mg/L (ref 0–10)

## 2020-10-17 LAB — CYCLIC CITRUL PEPTIDE ANTIBODY, IGG/IGA: Cyclic Citrullin Peptide Ab: 8 units (ref 0–19)

## 2020-10-17 LAB — ANA COMPREHENSIVE PANEL
Anti JO-1: <0.2 AI (ref 0.0–0.9)
Centromere Ab Screen: <0.2 AI (ref 0.0–0.9)
Chromatin Ab SerPl-aCnc: <0.2 AI (ref 0.0–0.9)
ENA RNP Ab: <0.2 AI (ref 0.0–0.9)
ENA SM Ab Ser-aCnc: <0.2 AI (ref 0.0–0.9)
ENA SSA (RO) Ab: 0.2 AI (ref 0.0–0.9)
ENA SSB (LA) Ab: <0.2 AI (ref 0.0–0.9)
Scleroderma (Scl-70) (ENA) Antibody, IgG: <0.2 AI (ref 0.0–0.9)
dsDNA Ab: 1 IU/mL (ref 0–9)

## 2020-10-17 LAB — SEDIMENTATION RATE: Sed Rate: 2 mm/hr (ref 0–40)

## 2020-10-17 LAB — URIC ACID: Uric Acid: 6.8 mg/dL (ref 3.0–7.2)

## 2020-10-17 NOTE — Telephone Encounter (Signed)
Labs all look good

## 2020-10-18 ENCOUNTER — Encounter: Payer: Self-pay | Admitting: Family Medicine

## 2020-12-22 ENCOUNTER — Encounter: Payer: Self-pay | Admitting: Family Medicine

## 2020-12-22 MED ORDER — AZITHROMYCIN 250 MG PO TABS: ORAL_TABLET | ORAL | 0 refills | Status: DC

## 2020-12-29 ENCOUNTER — Other Ambulatory Visit: Payer: Self-pay | Admitting: Family Medicine

## 2020-12-30 DIAGNOSIS — U071 COVID-19: Secondary | ICD-10-CM

## 2020-12-30 HISTORY — DX: COVID-19: U07.1

## 2021-02-16 ENCOUNTER — Ambulatory Visit: Payer: 59 | Admitting: Family Medicine

## 2021-02-16 ENCOUNTER — Other Ambulatory Visit: Payer: Self-pay

## 2021-02-16 ENCOUNTER — Encounter: Payer: Self-pay | Admitting: Family Medicine

## 2021-02-16 DIAGNOSIS — M25562 Pain in left knee: Secondary | ICD-10-CM | POA: Diagnosis not present

## 2021-02-16 DIAGNOSIS — M79671 Pain in right foot: Secondary | ICD-10-CM

## 2021-02-16 DIAGNOSIS — G8929 Other chronic pain: Secondary | ICD-10-CM | POA: Diagnosis not present

## 2021-02-16 NOTE — Progress Notes (Signed)
Office Visit Note   Patient: Judy Manning           Date of Birth: 18-Aug-1965           MRN: [id number] Visit Date: 02/16/2021 Requested by: Eunice Blase, MD [address] [place],  [place] [address] PCP: Eunice Blase, MD  Subjective: Chief Complaint  Patient presents with   Left Knee - Pain    Pain medial aspect of knee. Had this problem a year ago. Intermittent. Aggravated with crossing her legs. Interested in exercises to help.   Right Ankle - Pain    Pain posterior ankle/Achilles - would like to discuss injections.    HPI: She is here with left knee and right ankle pain.  Longstanding problems with both of these areas.  Her knee was injected a little more than a year ago prior to a vacation.  It only helped a little bit.  She has ongoing pain on the medial aspect.  Her right posterior heel continues to hurt on a daily basis.  It bothers her primarily with walking, no pain at rest.                ROS:   All other systems were reviewed and are negative.  Objective: Vital Signs: There were no vitals taken for this visit.  Physical Exam:  General:  Alert and oriented, in no acute distress. Pulm:  Breathing unlabored. Psy:  Normal mood, congruent affect.  Left knee: No effusion today.  She has 1+ patellofemoral grind.  There is tenderness on the medial joint line and pain with McMurray's, no definite click.  Ligaments feel stable. Right heel: She is tender anterior to the Achilles near the insertion on the calcaneus.  Tenderness is most pronounced on the lateral side.  The Achilles itself has no thickening, no palpable defect.   Imaging: No results found.  Assessment & Plan: Chronic left knee pain, suspicious for medial meniscus tea -MRI to evaluate.  Could contemplate surgical consult depending on the findings.  2.  Chronic right heel pain, question Haglund deformity -MRI to evaluate.    Procedures: No procedures performed        PMFS History: Patient  Active Problem List   Diagnosis Date Noted   Other hyperlipidemia 06/17/2020   History of vitamin D deficiency 06/17/2020   Rheumatoid factor positive 06/17/2020   Endometrial cells on cervical Pap smear inconsistent w/LMP 09/18/2018   Fibroid 09/18/2018   Menorrhagia with irregular cycle 09/18/2018   History of basal cell carcinoma (BCC) 03/23/2018   Polyarthritis with positive rheumatoid factor ([facility]) 03/23/2018   Other fatigue 03/23/2018   Ganglion cyst 10/31/2017   Bilateral carpal tunnel syndrome 06/18/2017   Recurrent UTI 07/30/2013   Migraine 05/06/2013   Polycystic kidney disease 05/06/2013   History reviewed. No pertinent past medical history.  Family History  Problem Relation Age of Onset   Rheum arthritis Mother    Heart disease Mother        Pacemaker   COPD Mother        Smoker   Skin cancer Mother    Cancer Mother    Stroke Father    Polycystic kidney disease Father    Arthritis Brother    Alzheimer's disease Maternal Grandmother 32   Diabetes Paternal Grandfather    Skin cancer Paternal Grandfather    Cancer Paternal Grandfather    Breast cancer Neg Hx    Colon cancer Neg Hx     Past Surgical History:  Procedure Laterality Date   AUGMENTATION MAMMAPLASTY     Social History   Occupational History   Not on file  Tobacco Use   Smoking status: Never   Smokeless tobacco: Never  Substance and Sexual Activity   Alcohol use: Not Currently    Comment: rarely ever   Drug use: Never   Sexual activity: Not on file

## 2021-02-23 ENCOUNTER — Ambulatory Visit: Payer: 59 | Admitting: Family Medicine

## 2021-03-01 ENCOUNTER — Encounter: Payer: Self-pay | Admitting: Family Medicine

## 2021-03-07 ENCOUNTER — Ambulatory Visit
Admission: RE | Admit: 2021-03-07 | Discharge: 2021-03-07 | Disposition: A | Discharge status: Home / Self Care | Payer: 59 | Source: Ambulatory Visit | Attending: Family Medicine | Admitting: Family Medicine

## 2021-03-07 ENCOUNTER — Other Ambulatory Visit: Payer: Self-pay

## 2021-03-07 DIAGNOSIS — G8929 Other chronic pain: Secondary | ICD-10-CM

## 2021-03-07 DIAGNOSIS — M25562 Pain in left knee: Secondary | ICD-10-CM

## 2021-03-07 IMAGING — MR MR KNEE*L* W/O CM
4 of 7 series · 20 of 40 positions shown · non-contrast
Comparison: None.

CLINICAL DATA: Chronic left knee pain.

EXAM:
MRI OF THE LEFT KNEE WITHOUT CONTRAST
TECHNIQUE: Multiplanar, multisequence MR imaging of the knee was performed. No
intravenous contrast was administered.

[Series 3: T2 fat-sat · axial · 4.0mm · 0.50mm/px · z∈[-45,+50]mm · 3 of 24 slices shown]
[im 5/24]
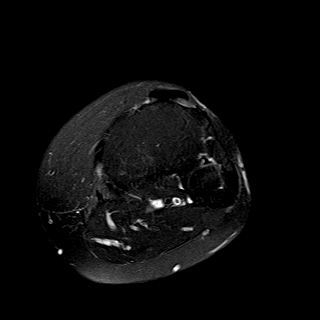
[im 14/24]
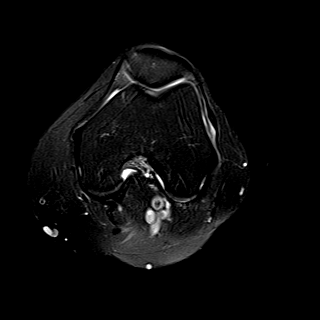
[im 24/24]
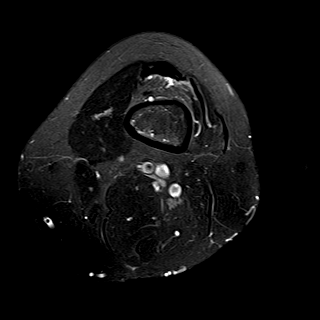

[Series 7: PD fat-sat · sagittal · 3.0mm · 0.29mm/px · 6 of 27 slices shown (1 of 3)]
[im 1/27]
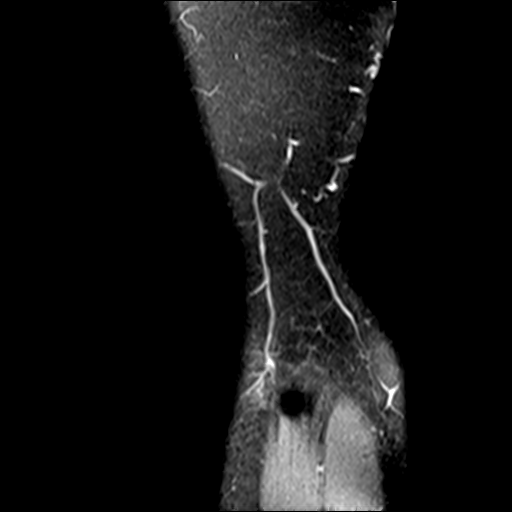
[im 6/27]
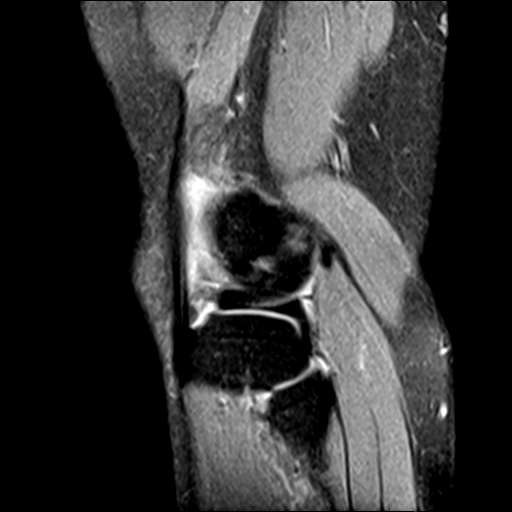
[im 11/27]
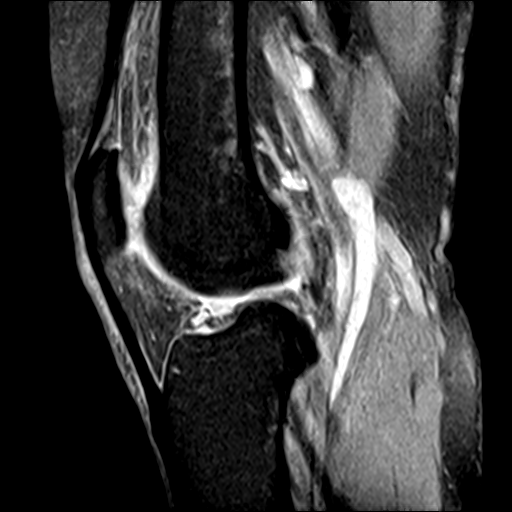
[im 16/27]
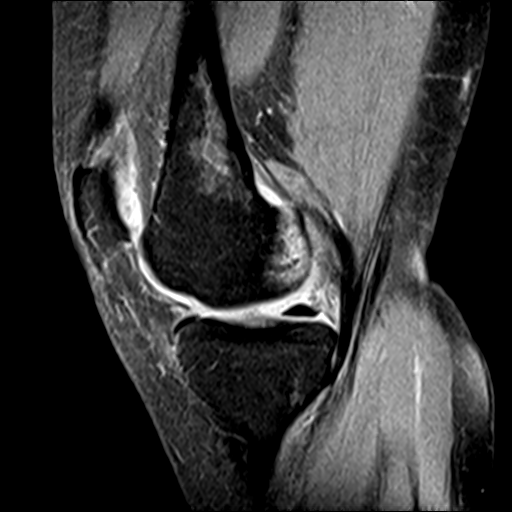
[im 21/27]
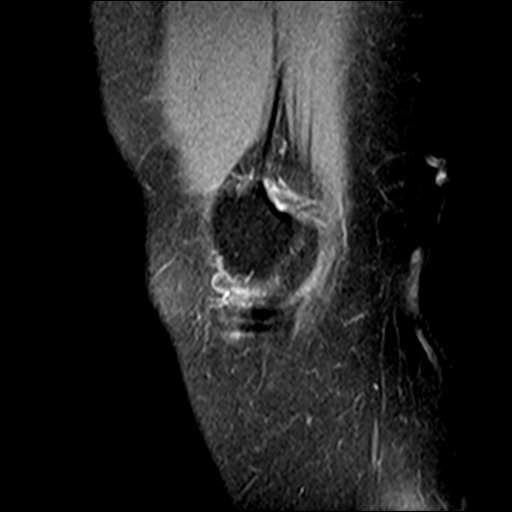
[im 27/27]
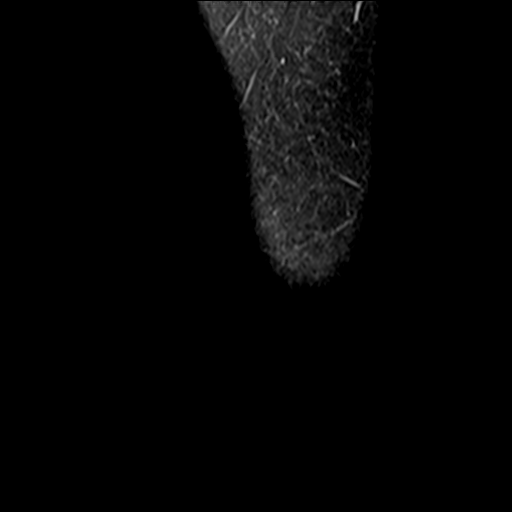

[Series 8: PD fat-sat · coronal · 3.0mm · 0.29mm/px · 6 of 28 slices shown (2 of 3)]
[im 1/28]
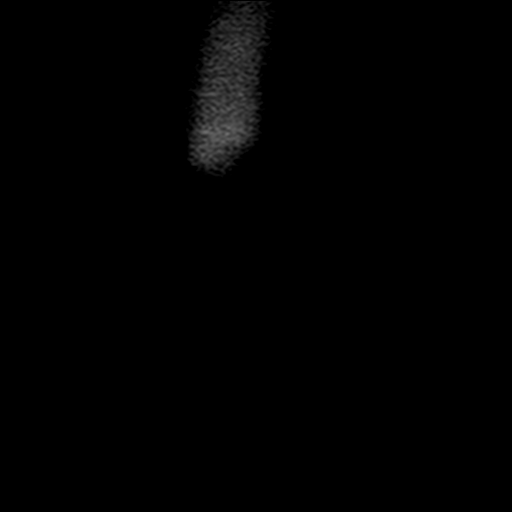
[im 6/28]
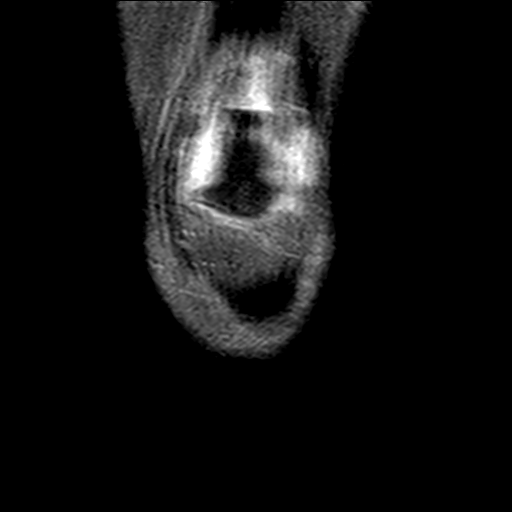
[im 11/28]
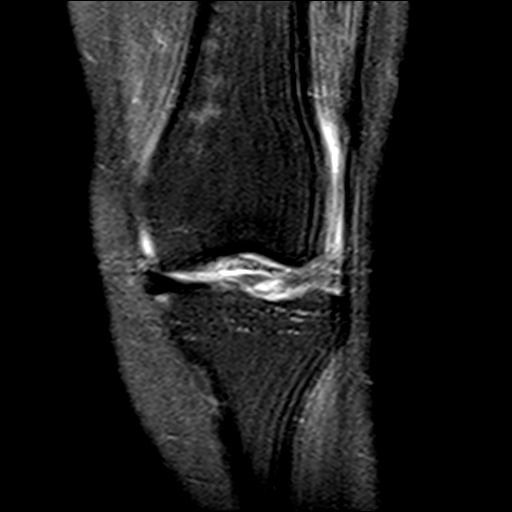
[im 17/28]
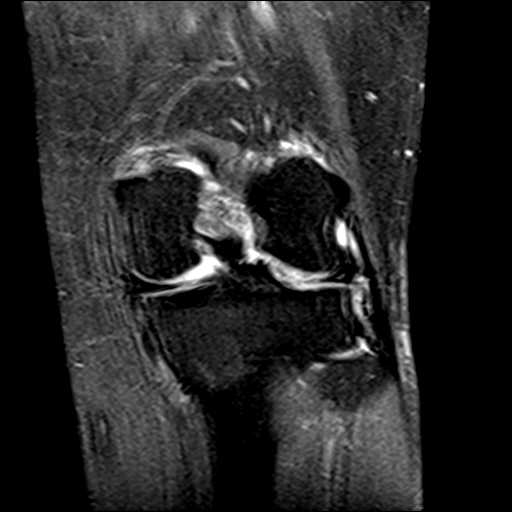
[im 22/28]
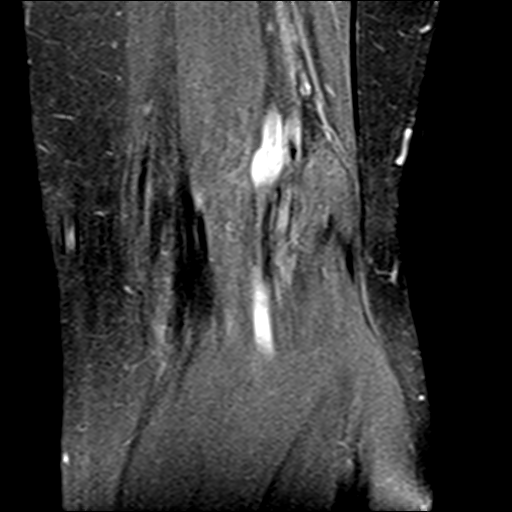
[im 28/28]
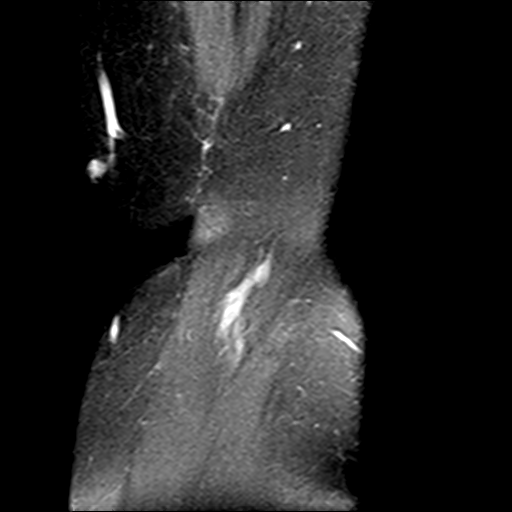

[Series 9: PD fat-sat · coronal · 3.0mm · 0.29mm/px · 5 of 28 slices shown (3 of 3)]
[im 1/28]
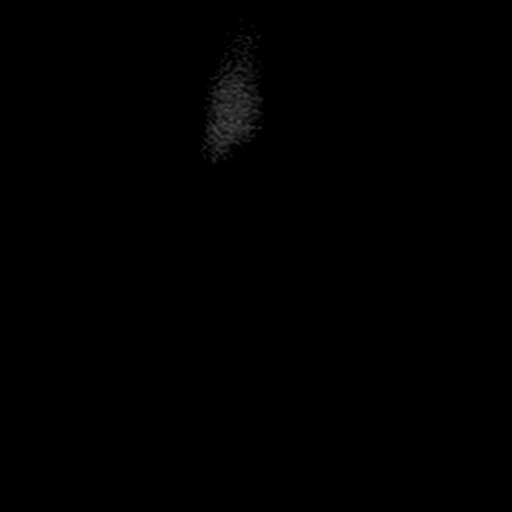
[im 6/28]
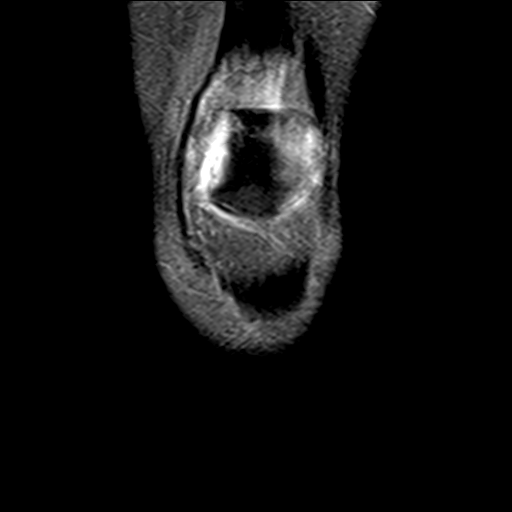
[im 11/28]
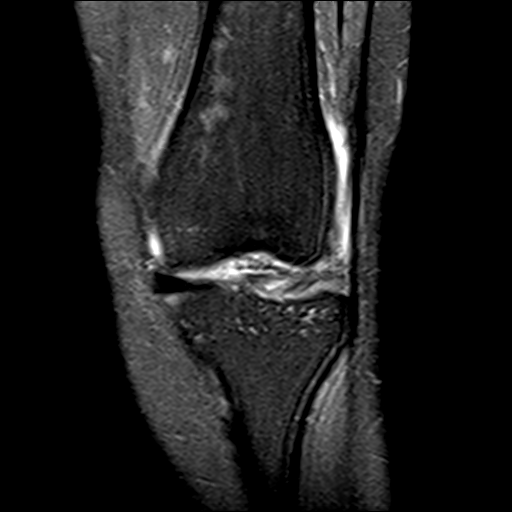
[im 17/28]
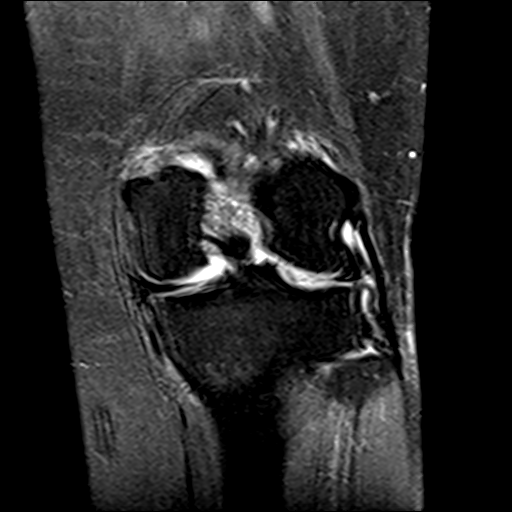
[im 28/28]
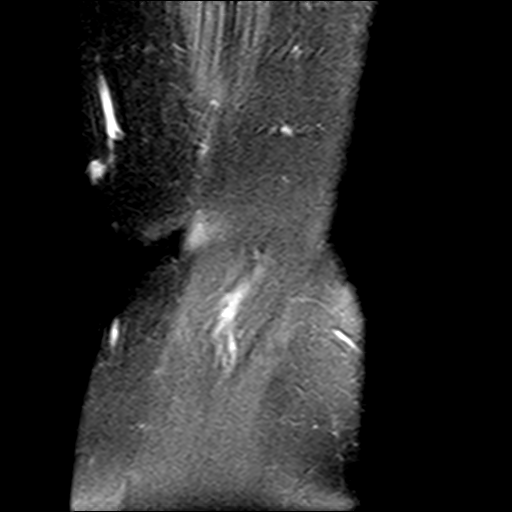

[20 of 40 positions shown; findings below may reference images not displayed]

FINDINGS: Despite efforts by the technologist and patient, motion artifact is
present on today's exam and could not be eliminated. This reduces
exam sensitivity and specificity.

MENISCI

Medial meniscus:  Intact.

Lateral meniscus: Linear undersurface signal abnormality of the body
on the motion limited coronal PD fat saturated images is not
confirmed on other sequences and is therefore more likely
artifactual rather than a small tear.

LIGAMENTS

Cruciates:  Intact ACL and PCL.

Collaterals: Medial collateral ligament is intact. Lateral
collateral ligament complex is intact.

CARTILAGE

Patellofemoral: Mild partial-thickness cartilage loss over the
medial patellar facet and medial trochlea.

Medial: Mild cartilage thinning over the central weight-bearing
medial femoral condyle and medial tibial plateau.

Lateral:  No chondral defect.

Joint:  Trace joint effusion.  Normal Hoffa's fat.

Popliteal Fossa:  No Baker cyst. Intact popliteus tendon.

Extensor Mechanism: Intact quadriceps tendon and patellar tendon.
Intact medial and lateral patellar retinaculum. Intact MPFL.

Bones: No focal marrow signal abnormality. No fracture or
dislocation.

Other: None.
IMPRESSION: 1. Linear undersurface signal abnormality of the lateral meniscus
body on the motion limited coronal PD fat saturated images is not
confirmed on other sequences and is therefore more likely
artifactual rather than a small tear. Correlate with physical exam.
2. Early medial and patellofemoral compartment osteoarthritis.

## 2021-03-07 IMAGING — MR MR HEEL *R* W/O CM
7 series · 40 of 40 positions shown · non-contrast
Comparison: None.

CLINICAL DATA: Chronic right heel pain.

EXAM:
MR OF THE RIGHT HEEL WITHOUT CONTRAST
TECHNIQUE: Multiplanar, multisequence MR imaging of the right ankle was
performed. No intravenous contrast was administered.

[Series 4: T2 fat-sat · axial · 3.0mm · 0.50mm/px · z∈[-134,-13]mm · 7 of 32 slices shown (1 of 2)]
[im 1/32]
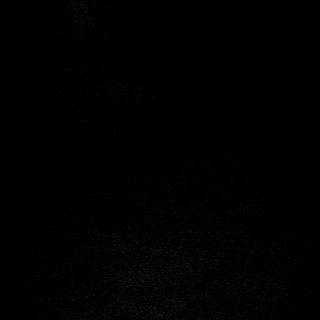
[im 6/32]
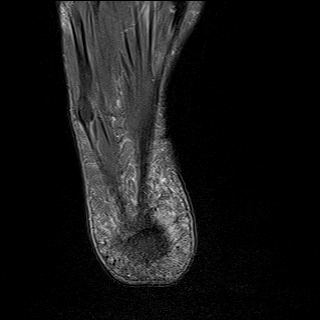
[im 11/32]
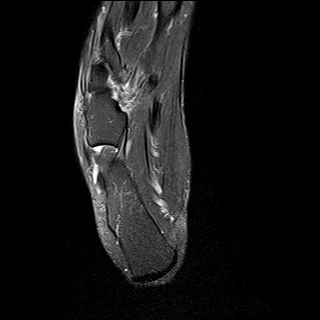
[im 16/32]
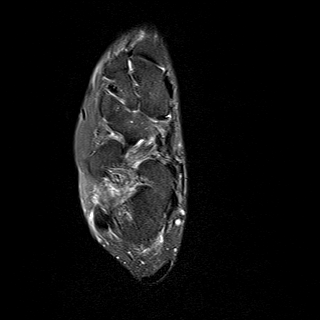
[im 21/32]
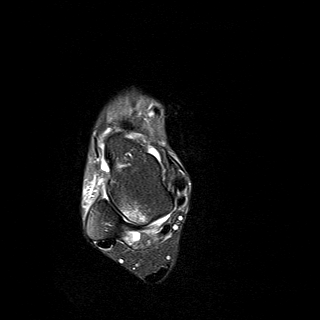
[im 26/32]
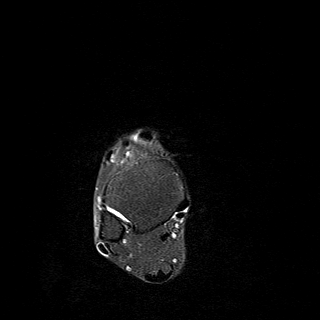
[im 32/32]
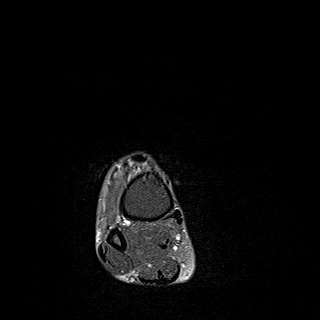

[Series 5: PD fat-sat · axial · 3.0mm · 0.50mm/px · z∈[-134,-13]mm · 6 of 32 slices shown]
[im 1/32]
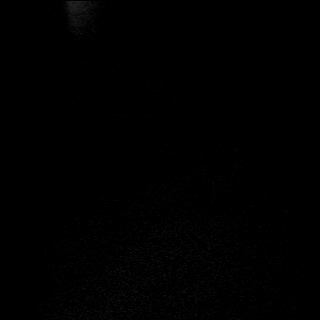
[im 7/32]
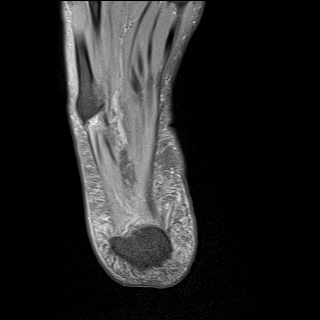
[im 13/32]
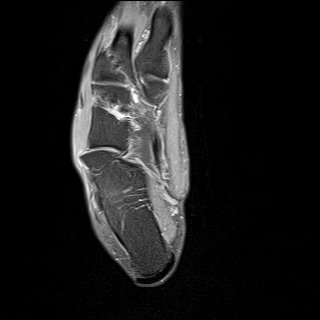
[im 19/32]
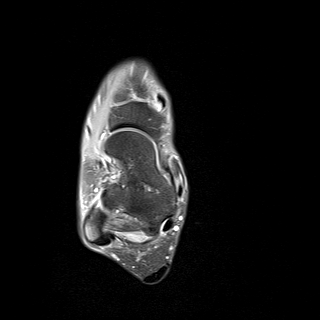
[im 25/32]
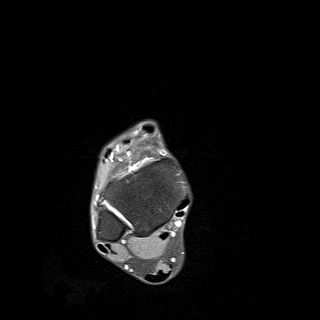
[im 32/32]
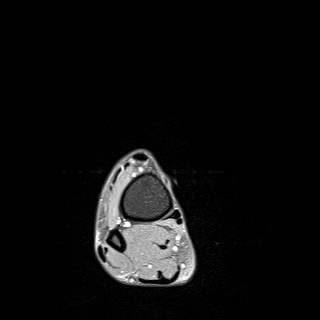

[Series 6: T1 · sagittal · 4.0mm · 0.56mm/px · 4 of 22 slices shown (1 of 2)]
[im 1/22]
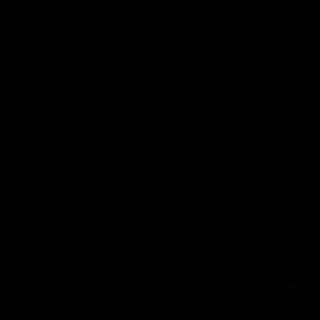
[im 8/22]
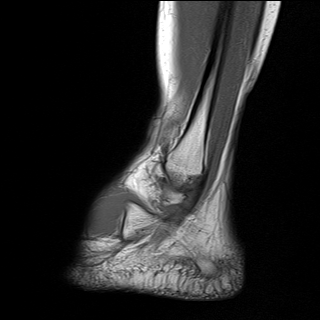
[im 15/22]
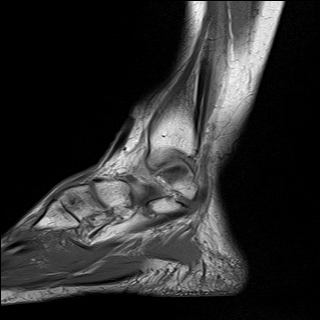
[im 22/22]
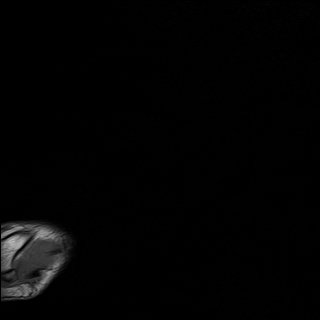

[Series 7: STIR · sagittal · 4.0mm · 0.35mm/px · 4 of 22 slices shown]
[im 1/22]
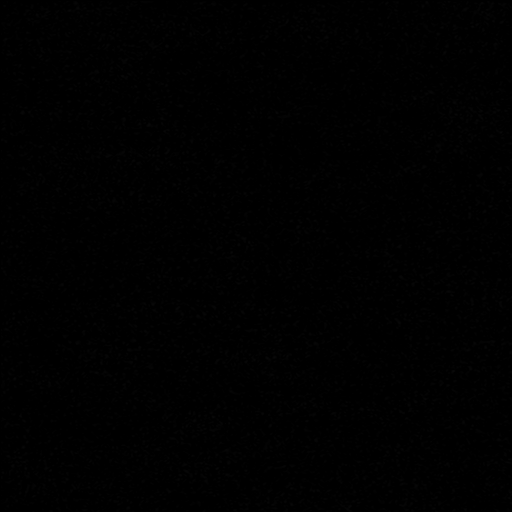
[im 8/22]
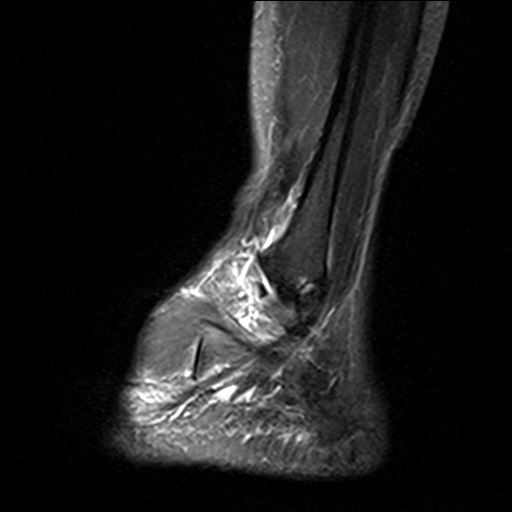
[im 15/22]
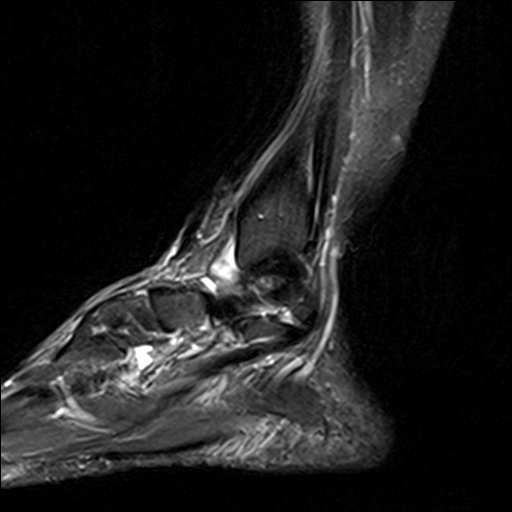
[im 22/22]
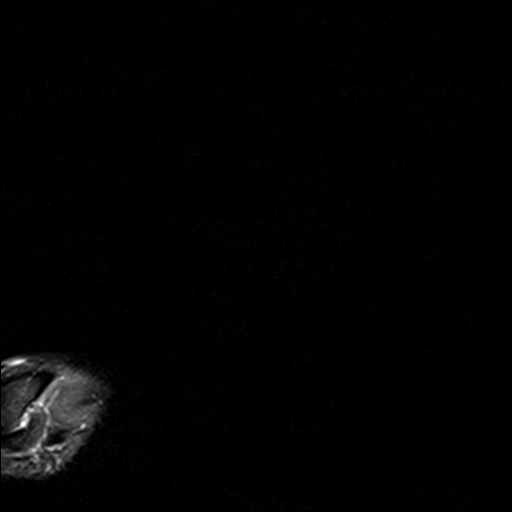

[Series 8: T2 fat-sat · coronal · 3.0mm · 0.50mm/px · 7 of 35 slices shown (2 of 2)]
[im 1/35]
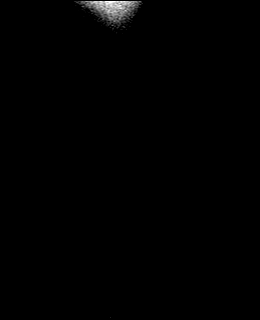
[im 6/35]
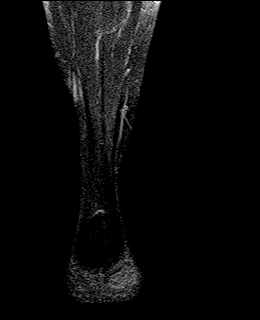
[im 12/35]
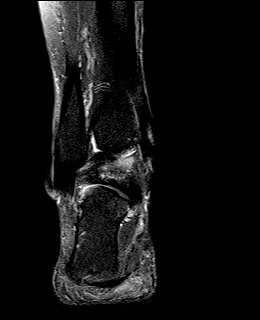
[im 18/35]
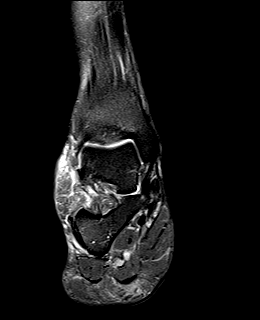
[im 23/35]
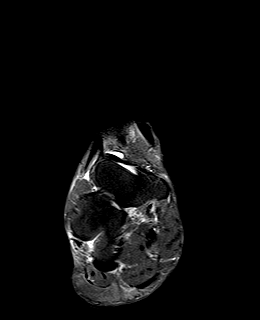
[im 29/35]
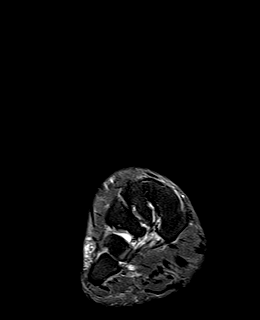
[im 35/35]
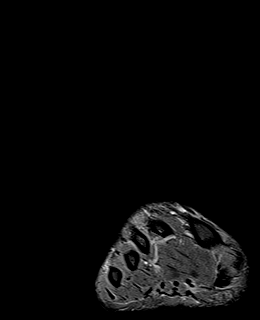

[Series 9: T1 · axial · 3.0mm · 0.50mm/px · z∈[-138,-25]mm · 6 of 30 slices shown (2 of 2)]
[im 1/30]
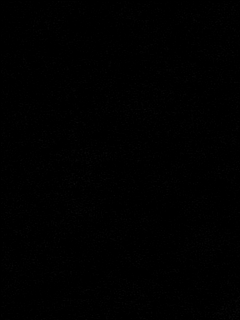
[im 6/30]
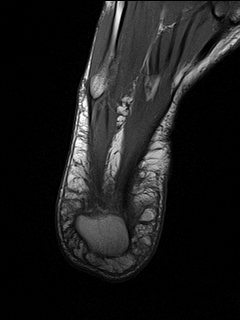
[im 12/30]
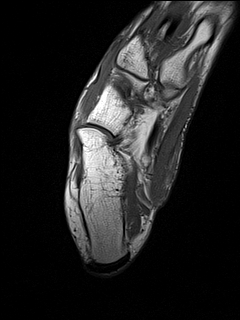
[im 18/30]
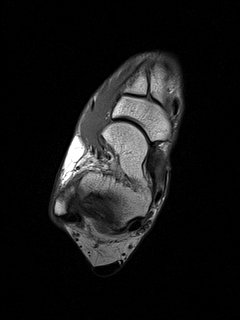
[im 24/30]
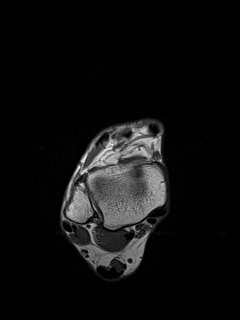
[im 30/30]
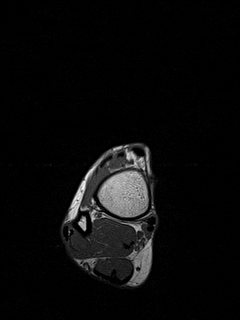

[Series 10: T1 fat-sat · axial · 3.0mm · 0.62mm/px · z∈[-139,-26]mm · 6 of 30 slices shown]
[im 1/30]
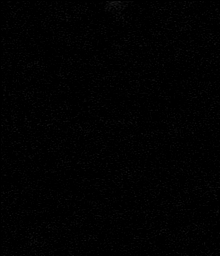
[im 6/30]
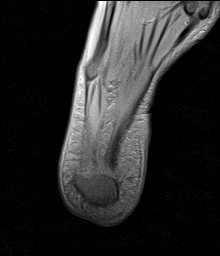
[im 12/30]
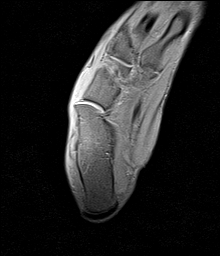
[im 18/30]
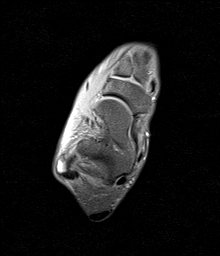
[im 24/30]
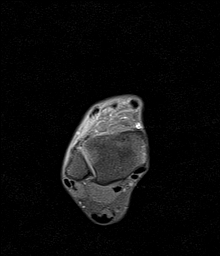
[im 30/30]
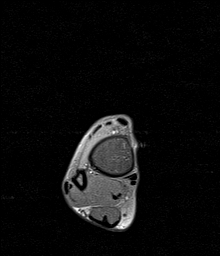

[40 of 40 positions shown; findings below may reference images not displayed]

FINDINGS: TENDONS

Peroneal: Peroneal longus tendon intact. Peroneal brevis intact.

Posteromedial: Posterior tibial tendon intact. Flexor digitorum
longus tendon intact. Flexor hallucis longus tendon intact.

Anterior: Tibialis anterior tendon intact. Extensor hallucis longus
tendon intact Extensor digitorum longus tendon intact.

Achilles:  Intact.

Plantar Fascia: Intact.

LIGAMENTS

Lateral: Anterior talofibular ligament intact. Calcaneofibular
ligament intact. Posterior talofibular ligament intact. Anterior and
posterior tibiofibular ligaments intact.

Medial: Deltoid ligament intact. Spring ligament intact.

CARTILAGE

Ankle Joint: Small joint effusion. Normal ankle mortise. No chondral
defect.

Subtalar Joints/Sinus Tarsi: Edema in the posterior talus with small
posterior subtalar marginal osteophytes. No subtalar joint effusion.
Normal sinus tarsi.

Bones: No acute fracture or dislocation.  No suspicious bone lesion.

Soft Tissue: No soft tissue mass or fluid collection.
IMPRESSION: 1. Normal Achilles tendon and plantar fascia.
2. Mild posterior subtalar osteoarthritis.

## 2021-03-08 ENCOUNTER — Telehealth: Payer: Self-pay | Admitting: Family Medicine

## 2021-03-08 NOTE — Telephone Encounter (Signed)
Left knee MRI scan is notable for very subtle signal abnormality in the lateral meniscus but not a definite tear by MRI criteria.  The medial meniscus, which seemed to have more symptoms, looks normal.  There is some early arthritis on the medial side and behind the kneecap.  The right heel MRI scan reveals mild arthritis in the subtalar joint.  All else looks good.  No clear-cut indication for surgery for either of these areas.  Could contemplate getting approval for gel injections for the knee.  Physical therapy is another consideration for both areas.  Glucosamine and turmeric over-the-counter are good options for arthritis.

## 2021-03-19 ENCOUNTER — Telehealth: Payer: Self-pay | Admitting: Family Medicine

## 2021-03-19 NOTE — Telephone Encounter (Signed)
Noted  

## 2021-03-19 NOTE — Telephone Encounter (Signed)
Requesting approval for left knee gel injections for OA.

## 2021-03-23 ENCOUNTER — Encounter: Payer: Self-pay | Admitting: Family Medicine

## 2021-03-23 ENCOUNTER — Other Ambulatory Visit: Payer: Self-pay | Admitting: Family Medicine

## 2021-03-23 MED ORDER — TRETINOIN 0.025 % EX CREA: TOPICAL_CREAM | Freq: Every day | CUTANEOUS | 11 refills | Status: DC

## 2021-03-30 ENCOUNTER — Other Ambulatory Visit: Payer: Self-pay

## 2021-03-30 ENCOUNTER — Encounter: Payer: Self-pay | Admitting: Emergency Medicine

## 2021-03-30 ENCOUNTER — Emergency Department
Admission: EM | Admit: 2021-03-30 | Discharge: 2021-03-30 | Disposition: A | Discharge status: Home / Self Care | Payer: 59 | Source: Home / Self Care | Attending: Family Medicine | Admitting: Family Medicine

## 2021-03-30 DIAGNOSIS — R509 Fever, unspecified: Secondary | ICD-10-CM | POA: Diagnosis not present

## 2021-03-30 DIAGNOSIS — J111 Influenza due to unidentified influenza virus with other respiratory manifestations: Secondary | ICD-10-CM

## 2021-03-30 HISTORY — DX: Polycystic kidney, unspecified: Q61.3

## 2021-03-30 LAB — POCT URINALYSIS DIP (MANUAL ENTRY)
Glucose, UA: NEGATIVE mg/dL
Ketones, POC UA: NEGATIVE mg/dL
Leukocytes, UA: NEGATIVE
Nitrite, UA: NEGATIVE
Spec Grav, UA: 1.01 (ref 1.010–1.025)
Urobilinogen, UA: 1 E.U./dL
pH, UA: 6 (ref 5.0–8.0)

## 2021-03-30 MED ORDER — ONDANSETRON HCL 8 MG PO TABS: 8.0000 mg | ORAL_TABLET | Freq: Three times a day (TID) | ORAL | 0 refills | Status: DC | PRN

## 2021-03-30 NOTE — Discharge Instructions (Addendum)
Drink plenty of water Rest May alternate Tylenol with ibuprofen for pain and fever Take Zofran as needed for nausea and vomiting Check MyChart for your test results You will be called if any results are positive

## 2021-03-30 NOTE — ED Triage Notes (Signed)
Fatigue & body aches since Sunday  Fever today - Tmax 104 Tylenol '1000mg'$  @ 1600 (total today was '2000mg'$ ) Negative home COVID tests the last 3 days  Emesis x 1 today  COVID 5/22 COVID vaccine x 2

## 2021-03-30 NOTE — ED Provider Notes (Signed)
[facility]    CSN: [id number] Arrival date & time: 03/30/21  1727      History   Chief Complaint Chief Complaint  Patient presents with   Fever   Fatigue    HPI Judy Manning is a 55 y.o. female.   HPI  Pleasant 55 year old woman who has been sick for 4 days.  She has fever and fatigue.  Mild headache.  Feels weak.  No sore throat.  Mild sinus congestion.  No runny or stuffy nose.Postnasal drip.  No ear pain no coughing.  No underlying lung disease or asthma.  She has had COVID vaccinations.  She has had COVID.  She states that in spite of waiting 3 or 4 days she still spiking fevers to 102.  Shaking chills.  Feeling very tired.  She has done 3 COVID tests over the last 3 days and they are all negative.  No known exposure to illness.  No abdominal symptoms pain or diarrhea.  She did have nausea and vomiting with one of her fever spells.  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      Home Medications    Prior to Admission medications   Medication Sig Start Date End Date Taking? Authorizing Provider  ondansetron (ZOFRAN) 8 MG  tablet Take 1 tablet (8 mg total) by mouth every 8 (eight) hours as needed for nausea or vomiting. 03/30/21  Yes Raylene Everts, MD  Biotin 10000 MCG TABS Take by mouth.    [provider]  cetirizine (ZYRTEC) 10 MG tablet Take 10 mg by mouth daily as needed for allergies (alternates with claritin).    [provider]  chlorthalidone (HYGROTON) 25 MG tablet TAKE 1 TABLET BY MOUTH  DAILY 03/27/20   Hilts, Legrand Como, MD  Cholecalciferol (VITAMIN D-3) 5000 units TABS Take by mouth.    [provider]  Dapsone 5 % topical gel Apply 1 application topically 2 (two) times daily. 09/18/20   Hilts, Legrand Como, MD  gabapentin (NEURONTIN) 300 MG capsule TAKE 1 CAPSULE BY MOUTH  TWICE DAILY 12/29/20   Hilts, Michael, MD  JYNARQUE 90 & 30 MG TBPK  03/14/18   [provider]  loratadine (CLARITIN) 10 MG tablet Take 10 mg by mouth daily as needed for allergies (alternates with Zyrtec).    [provider]  Multiple Vitamin (MULTIVITAMIN) tablet Take 1 tablet by mouth daily.    [provider]  tretinoin (RETIN-A) 0.025 % cream Apply topically at bedtime. 03/23/21   Hilts, Legrand Como, MD  zolpidem (AMBIEN) 10 MG tablet TAKE 1/2 TO 1 TABLET BY  MOUTH AT BEDTIME AS NEEDED  FOR SLEEP  03/23/21   Hilts, Legrand Como, MD    Family History Family History  Problem Relation Age of Onset   Rheum arthritis Mother    Heart disease Mother        Pacemaker   COPD Mother        Smoker   Skin cancer Mother    Cancer Mother    Stroke Father    Polycystic kidney disease Father    Arthritis Brother    Alzheimer's disease Maternal Grandmother 46   Diabetes Paternal Grandfather    Skin cancer Paternal Grandfather    Cancer Paternal Grandfather    Breast cancer Neg Hx    Colon cancer Neg Hx     Social History Social History   Tobacco Use   Smoking status: Never   Smokeless tobacco: Never  Substance Use Topics   Alcohol use: Not Currently    Comment: rarely ever   Drug use:  Never     Allergies   Imitrex [sumatriptan], Ibuprofen, Nitrofurantoin, and Oseltamivir   Review of Systems Review of Systems  See HPI Physical Exam Triage Vital Signs ED Triage Vitals  Enc Vitals Group     BP 03/30/21 1741 104/68     Pulse Rate 03/30/21 1741 (!) 123     Resp 03/30/21 1741 18     Temp 03/30/21 1741 (!) 100.7 F (38.2 C)     Temp Source 03/30/21 1741 Oral     SpO2 03/30/21 1741 96 %     Weight 03/30/21 1752 115 lb (52.2 kg)     Height 03/30/21 1752 5' (1.524 m)     Head Circumference --      Peak Flow --      Pain Score 03/30/21 1743 3     Pain Loc --      Pain Edu? --      Excl. in [place]? --    No data found.  Updated Vital Signs BP 104/68 (BP Location: Right Arm)   Pulse (!) 123   Temp (!) 100.7 F (38.2 C) (Oral)   Resp 18   Ht 5' (1.524 m)   Wt 52.2 kg   LMP 03/16/2021 Comment: period has lasted for 2 weeks  SpO2 96%   BMI 22.46 kg/m      Physical Exam Constitutional:      General: She is not in acute distress.    Appearance: Normal appearance. She is well-developed. She is ill-appearing.  HENT:     Head: Normocephalic and atraumatic.     Right Ear: Tympanic membrane, ear canal and external ear normal.     Left Ear: Tympanic membrane, ear canal and external ear normal.     Nose: Nose normal. No congestion.     Mouth/Throat:     Mouth: Mucous membranes are moist.     Pharynx: Posterior oropharyngeal erythema present.     Comments: Slight posterior pharyngeal erythema.  No sore throat Eyes:     Conjunctiva/sclera: Conjunctivae normal.     Pupils: Pupils are equal, round, and reactive to light.  Cardiovascular:     Rate and Rhythm: Normal rate and regular rhythm.     Heart sounds: Normal heart sounds.  Pulmonary:     Effort: Pulmonary effort is normal. No respiratory distress.     Breath sounds: Normal breath sounds. No wheezing or rales.     Comments: Lungs are clear Abdominal:     General: There is no distension.      Palpations: Abdomen is soft.  Tenderness: There is no abdominal tenderness. There is no right CVA tenderness, left CVA tenderness or guarding.  Musculoskeletal:        General: Normal range of motion.     Cervical back: Normal range of motion.  Lymphadenopathy:     Cervical: Cervical adenopathy present.  Skin:    General: Skin is warm and dry.  Neurological:     Mental Status: She is alert.  Psychiatric:        Mood and Affect: Mood normal.        Behavior: Behavior normal.     UC Treatments / Results  Labs (all labs ordered are listed, but only abnormal results are displayed) Labs Reviewed  POCT URINALYSIS DIP (MANUAL ENTRY) - Abnormal; Notable for the following components:      Result Value   Bilirubin, UA small (*)    Blood, UA small (*)    Protein Ur, POC trace (*)    All other components within normal limits  COVID-19, FLU A+B AND RSV  CBC WITH DIFFERENTIAL/PLATELET    EKG   Radiology No results found.  Procedures Procedures (including critical care time)  Medications Ordered in UC Medications - No data to display  Initial Impression / Assessment and Plan / UC Course  I have reviewed the triage vital signs and the nursing notes.  Pertinent labs & imaging results that were available during my care of the patient were reviewed by me and considered in my medical decision making (see chart for details).     Patient has a fever.  Negative COVID test.  She did have a recent COVID infection in June (2 months ago) and is COVID vaccinated.  No known exposure to illness.  She has no real respiratory symptoms or sore throat.  She has no GI or GU symptoms of note.  I told her we can do a viral swab to check for influenza AMB, COVID, and RSV.  I will do a CBC and UA to further check for any other infection.  She will go home and push fluids.  Call PCP if not improving in a day or 2 Final Clinical Impressions(s) / UC Diagnoses   Final diagnoses:  FUO (fever of unknown  origin)  Influenza-like illness     Discharge Instructions      Drink plenty of water Rest May alternate Tylenol with ibuprofen for pain and fever Take Zofran as needed for nausea and vomiting Check MyChart for your test results You will be called if any results are positive   ED Prescriptions     Medication Sig Dispense Auth. Provider   ondansetron (ZOFRAN) 8 MG tablet Take 1 tablet (8 mg total) by mouth every 8 (eight) hours as needed for nausea or vomiting. 20 tablet Raylene Everts, MD      PDMP not reviewed this encounter.   Raylene Everts, MD 03/30/21 [phone]

## 2021-03-30 NOTE — ED Notes (Signed)
Daily max doses for tylenol reviewed w/ pt - ('4000mg'$  in a 24 hour period). Pt verbalized an understanding

## 2021-03-31 ENCOUNTER — Telehealth: Payer: Self-pay | Admitting: Emergency Medicine

## 2021-03-31 LAB — CBC WITH DIFFERENTIAL/PLATELET
Absolute Monocytes: 512 cells/uL (ref 200–950)
Basophils Absolute: 28 cells/uL (ref 0–200)
Basophils Relative: 0.3 %
Eosinophils Absolute: 9 cells/uL — ABNORMAL LOW (ref 15–500)
Eosinophils Relative: 0.1 %
HCT: 32.6 % — ABNORMAL LOW (ref 35.0–45.0)
Hemoglobin: 11 g/dL — ABNORMAL LOW (ref 11.7–15.5)
Lymphs Abs: 223 cells/uL — ABNORMAL LOW (ref 850–3900)
MCH: 27.2 pg (ref 27.0–33.0)
MCHC: 33.7 g/dL (ref 32.0–36.0)
MCV: 80.7 fL (ref 80.0–100.0)
MPV: 11.6 fL (ref 7.5–12.5)
Monocytes Relative: 5.5 %
Neutro Abs: 8528 cells/uL — ABNORMAL HIGH (ref 1500–7800)
Neutrophils Relative %: 91.7 %
Platelets: 140 10*3/uL (ref 140–400)
RBC: 4.04 10*6/uL (ref 3.80–5.10)
RDW: 13.9 % (ref 11.0–15.0)
Total Lymphocyte: 2.4 %
WBC: 9.3 10*3/uL (ref 3.8–10.8)

## 2021-03-31 NOTE — Telephone Encounter (Signed)
Patient calling to ask about significance of her recent labs including abnormal WBC differential; consulted with Eliezer Lofts who suggests follow up with PCP and repeat lab studies per their advice. Patient understands and will pick up print out of labs and appt. with PCP this week.

## 2021-04-01 LAB — COVID-19, FLU A+B AND RSV
Influenza A, NAA: NOT DETECTED
Influenza B, NAA: NOT DETECTED
RSV, NAA: NOT DETECTED
SARS-CoV-2, NAA: NOT DETECTED

## 2021-04-05 ENCOUNTER — Emergency Department (HOSPITAL_BASED_OUTPATIENT_CLINIC_OR_DEPARTMENT_OTHER)
Admission: EM | Admit: 2021-04-05 | Discharge: 2021-04-05 | Disposition: A | Discharge status: Home / Self Care | Payer: 59 | Source: Home / Self Care | Attending: Emergency Medicine | Admitting: Emergency Medicine

## 2021-04-05 ENCOUNTER — Encounter (HOSPITAL_BASED_OUTPATIENT_CLINIC_OR_DEPARTMENT_OTHER): Payer: Self-pay | Admitting: Emergency Medicine

## 2021-04-05 ENCOUNTER — Emergency Department (HOSPITAL_BASED_OUTPATIENT_CLINIC_OR_DEPARTMENT_OTHER): Payer: 59

## 2021-04-05 DIAGNOSIS — R000 Tachycardia, unspecified: Secondary | ICD-10-CM | POA: Insufficient documentation

## 2021-04-05 DIAGNOSIS — R112 Nausea with vomiting, unspecified: Secondary | ICD-10-CM

## 2021-04-05 DIAGNOSIS — R509 Fever, unspecified: Secondary | ICD-10-CM

## 2021-04-05 DIAGNOSIS — E876 Hypokalemia: Secondary | ICD-10-CM | POA: Insufficient documentation

## 2021-04-05 DIAGNOSIS — R197 Diarrhea, unspecified: Secondary | ICD-10-CM | POA: Insufficient documentation

## 2021-04-05 DIAGNOSIS — Z85828 Personal history of other malignant neoplasm of skin: Secondary | ICD-10-CM | POA: Insufficient documentation

## 2021-04-05 DIAGNOSIS — K7689 Other specified diseases of liver: Secondary | ICD-10-CM | POA: Diagnosis not present

## 2021-04-05 DIAGNOSIS — Z8616 Personal history of COVID-19: Secondary | ICD-10-CM | POA: Insufficient documentation

## 2021-04-05 DIAGNOSIS — R1011 Right upper quadrant pain: Secondary | ICD-10-CM | POA: Diagnosis not present

## 2021-04-05 LAB — CBC WITH DIFFERENTIAL/PLATELET
Abs Immature Granulocytes: 0.12 10*3/uL — ABNORMAL HIGH (ref 0.00–0.07)
Basophils Absolute: 0 10*3/uL (ref 0.0–0.1)
Basophils Relative: 0 %
Eosinophils Absolute: 0 10*3/uL (ref 0.0–0.5)
Eosinophils Relative: 0 %
HCT: 33.9 % — ABNORMAL LOW (ref 36.0–46.0)
Hemoglobin: 11.3 g/dL — ABNORMAL LOW (ref 12.0–15.0)
Immature Granulocytes: 1 %
Lymphocytes Relative: 9 %
Lymphs Abs: 1 10*3/uL (ref 0.7–4.0)
MCH: 26.8 pg (ref 26.0–34.0)
MCHC: 33.3 g/dL (ref 30.0–36.0)
MCV: 80.3 fL (ref 80.0–100.0)
Monocytes Absolute: 0.6 10*3/uL (ref 0.1–1.0)
Monocytes Relative: 6 %
Neutro Abs: 8.7 10*3/uL — ABNORMAL HIGH (ref 1.7–7.7)
Neutrophils Relative %: 84 %
Platelets: 295 10*3/uL (ref 150–400)
RBC: 4.22 MIL/uL (ref 3.87–5.11)
RDW: 13.6 % (ref 11.5–15.5)
WBC: 10.5 10*3/uL (ref 4.0–10.5)
nRBC: 0 % (ref 0.0–0.2)

## 2021-04-05 LAB — URINALYSIS, ROUTINE W REFLEX MICROSCOPIC
Bilirubin Urine: NEGATIVE
Glucose, UA: NEGATIVE mg/dL
Ketones, ur: 15 mg/dL — AB
Leukocytes,Ua: NEGATIVE
Nitrite: NEGATIVE
Protein, ur: 30 mg/dL — AB
Specific Gravity, Urine: 1.025 (ref 1.005–1.030)
pH: 6 (ref 5.0–8.0)

## 2021-04-05 LAB — COMPREHENSIVE METABOLIC PANEL
ALT: 14 U/L (ref 0–44)
AST: 18 U/L (ref 15–41)
Albumin: 3.6 g/dL (ref 3.5–5.0)
Alkaline Phosphatase: 105 U/L (ref 38–126)
Anion gap: 14 (ref 5–15)
BUN: 22 mg/dL — ABNORMAL HIGH (ref 6–20)
CO2: 27 mmol/L (ref 22–32)
Calcium: 9.2 mg/dL (ref 8.9–10.3)
Chloride: 93 mmol/L — ABNORMAL LOW (ref 98–111)
Creatinine, Ser: 1.3 mg/dL — ABNORMAL HIGH (ref 0.44–1.00)
GFR, Estimated: 49 mL/min — ABNORMAL LOW (ref >60)
Glucose, Bld: 101 mg/dL — ABNORMAL HIGH (ref 70–99)
Potassium: 3.1 mmol/L — ABNORMAL LOW (ref 3.5–5.1)
Sodium: 134 mmol/L — ABNORMAL LOW (ref 135–145)
Total Bilirubin: 0.7 mg/dL (ref 0.3–1.2)
Total Protein: 7.3 g/dL (ref 6.5–8.1)

## 2021-04-05 LAB — LIPASE, BLOOD: Lipase: 18 U/L (ref 11–51)

## 2021-04-05 LAB — LACTIC ACID, PLASMA: Lactic Acid, Venous: 0.9 mmol/L (ref 0.5–1.9)

## 2021-04-05 IMAGING — DX DG CHEST 1V PORT
1 series · 1 of 1 positions shown · non-contrast
Comparison: None.

CLINICAL DATA: Fever of unknown origin

EXAM:
PORTABLE CHEST 1 VIEW

[chest]
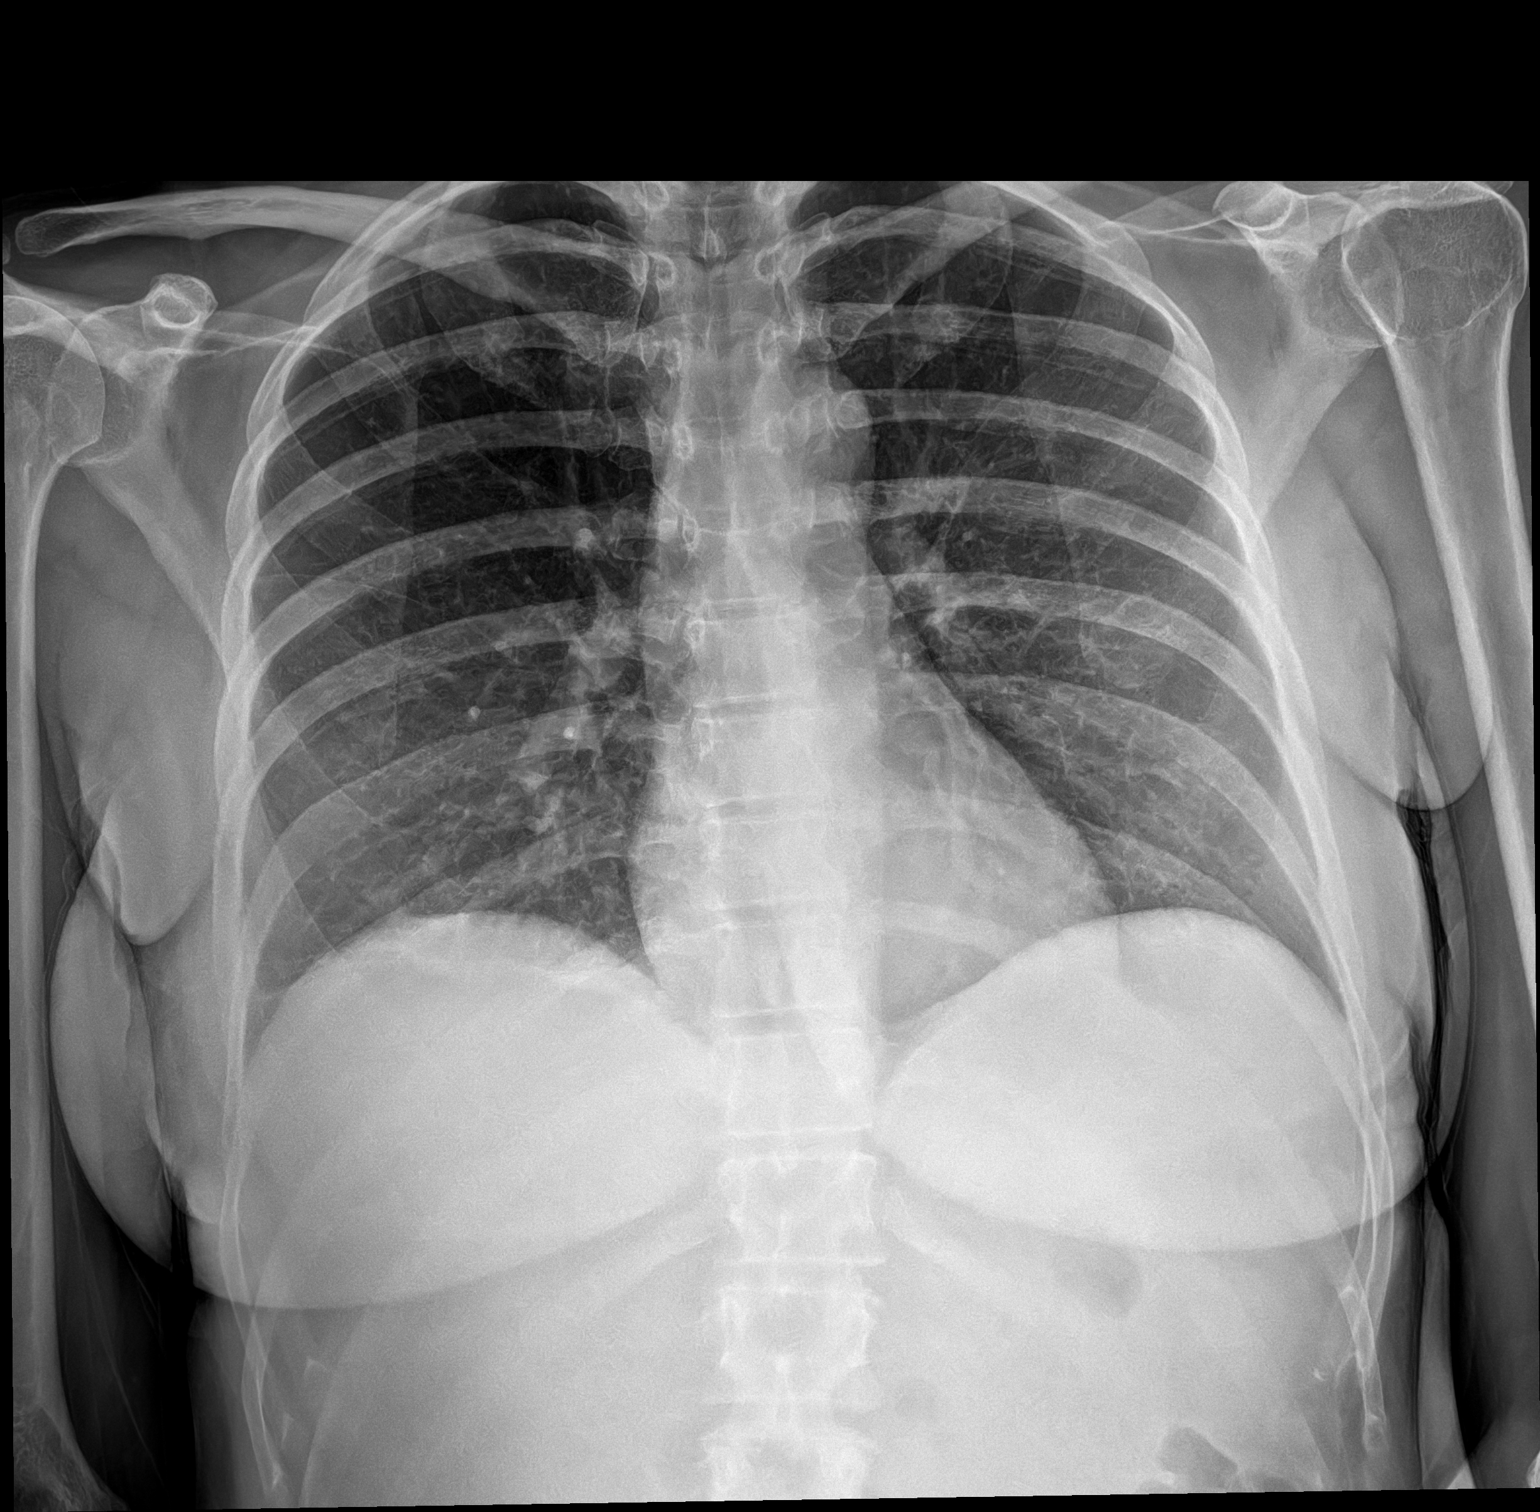

[1 of 1 positions shown; findings below may reference images not displayed]

FINDINGS: The heart size and mediastinal contours are within normal limits.
Both lungs are clear. The visualized skeletal structures are
unremarkable.
IMPRESSION: No active disease.

## 2021-04-05 IMAGING — CT CT ABD-PELV W/ CM
2 of 5 series · 16 of 46 positions shown, 18 images · IV contrast (APPLIED)
Comparison: None.

CLINICAL DATA: Epigastric pain, fever of unknown origin

EXAM:
CT ABDOMEN AND PELVIS WITH CONTRAST
TECHNIQUE: Multidetector CT imaging of the abdomen and pelvis was performed
using the standard protocol following bolus administration of
intravenous contrast.
CONTRAST:  50mL OMNIPAQUE IOHEXOL 350 MG/ML SOLN

[Series 2: abd pel w · axial · 0.60mm/px · z∈[+927,+1302]mm · 13 of 85 slices shown, 15 images]
[im 5/85  soft-tissue]
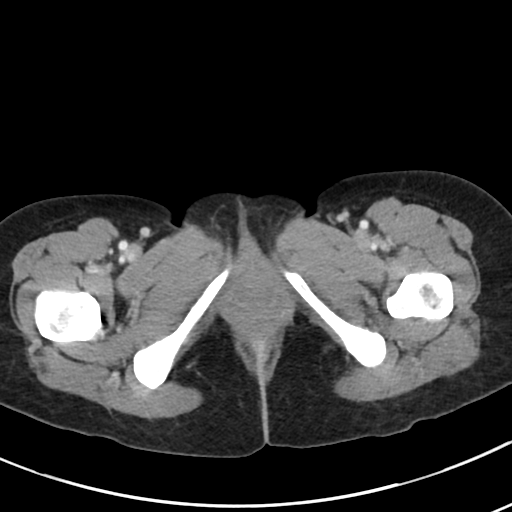
[im 5/85  bone]
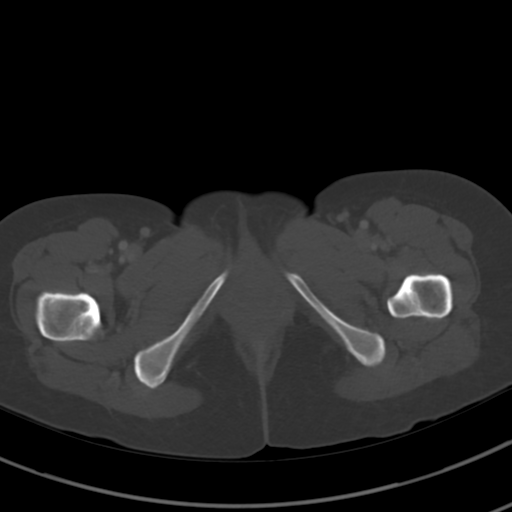
[im 14/85  soft-tissue]
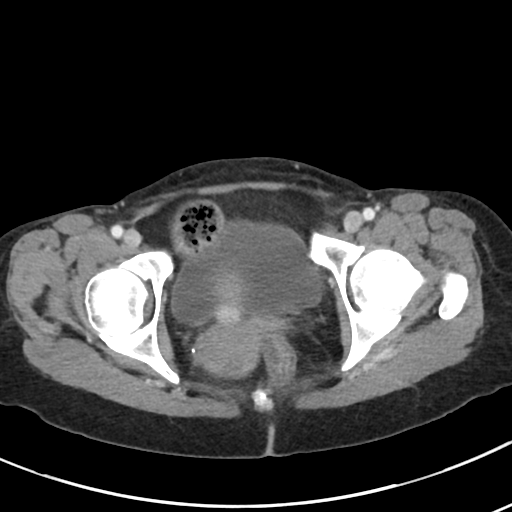
[im 18/85  soft-tissue]
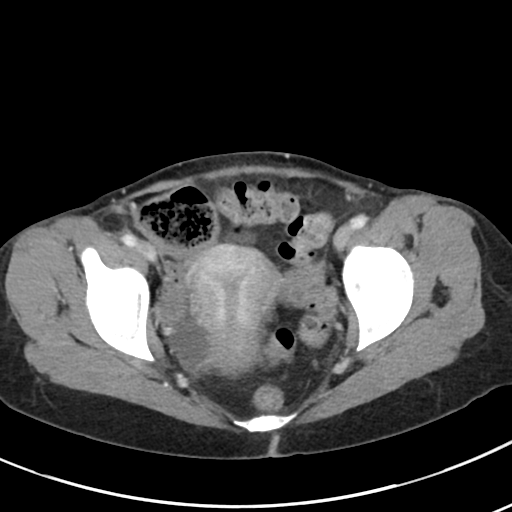
[im 23/85  soft-tissue]
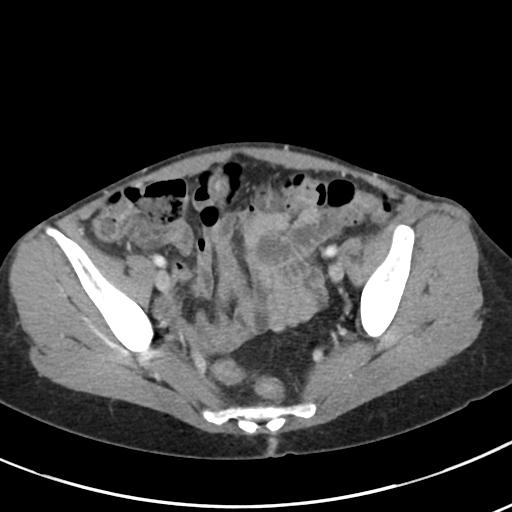
[im 31/85  soft-tissue]
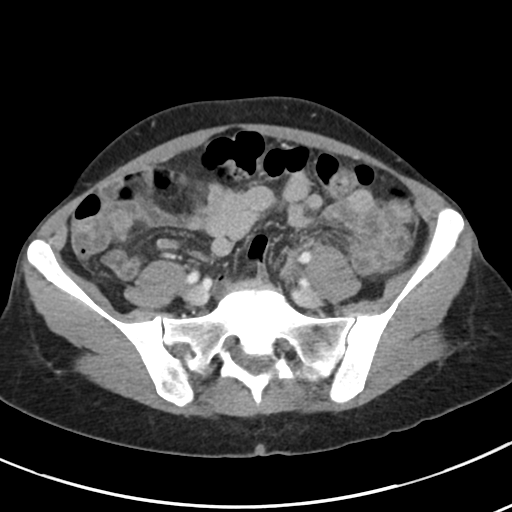
[im 36/85  soft-tissue]
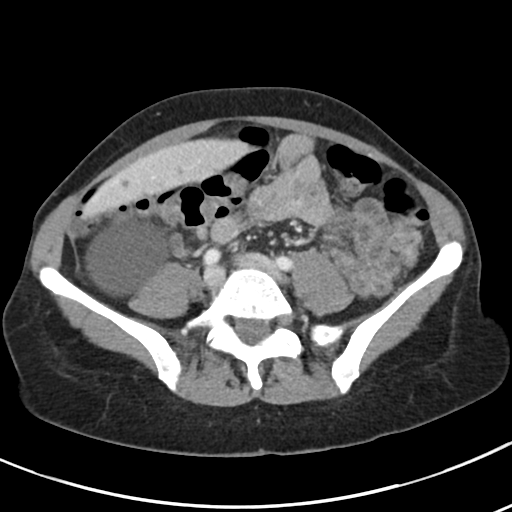
[im 45/85  soft-tissue]
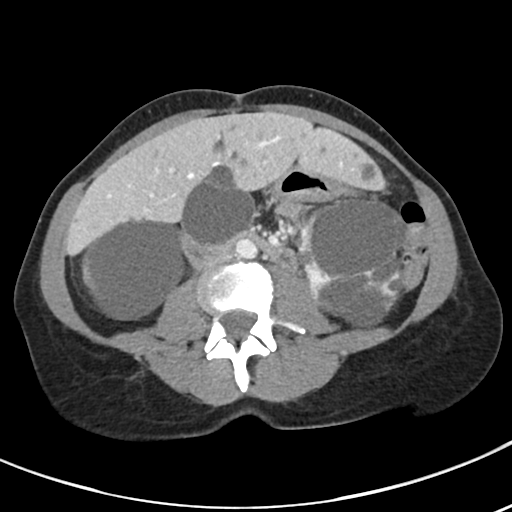
[im 49/85  soft-tissue]
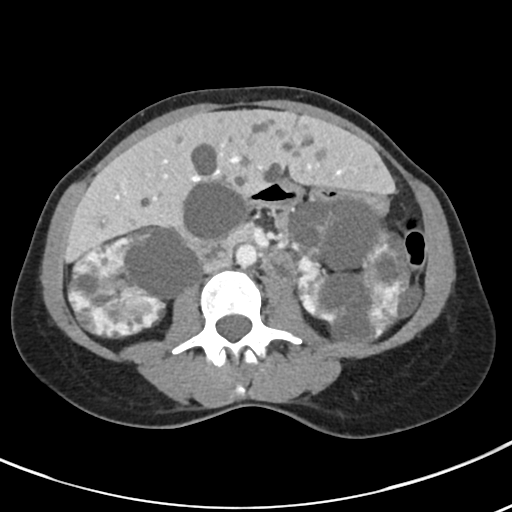
[im 54/85  soft-tissue]
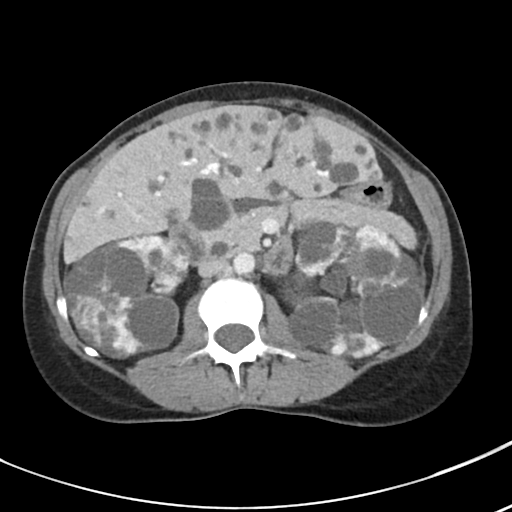
[im 54/85  bone]
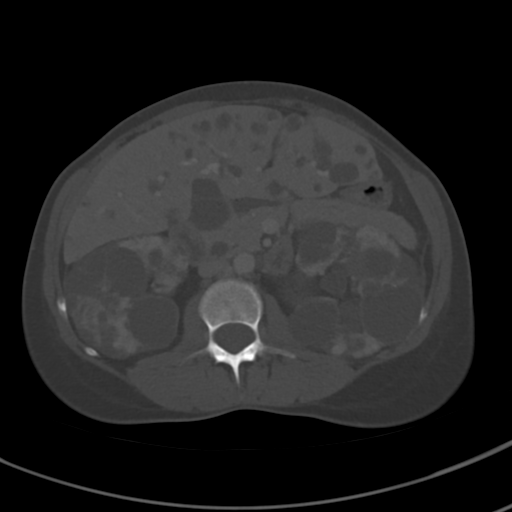
[im 62/85  soft-tissue]
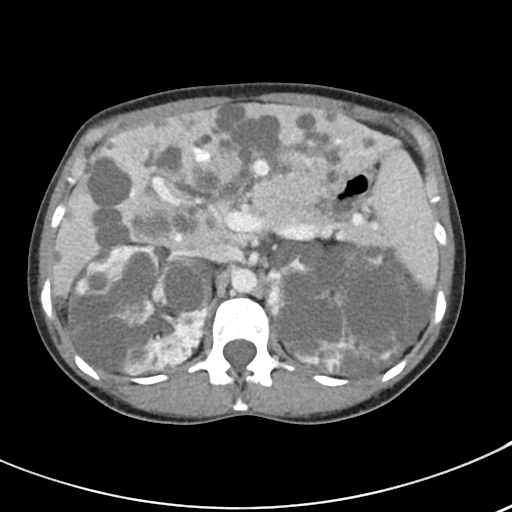
[im 67/85  soft-tissue]
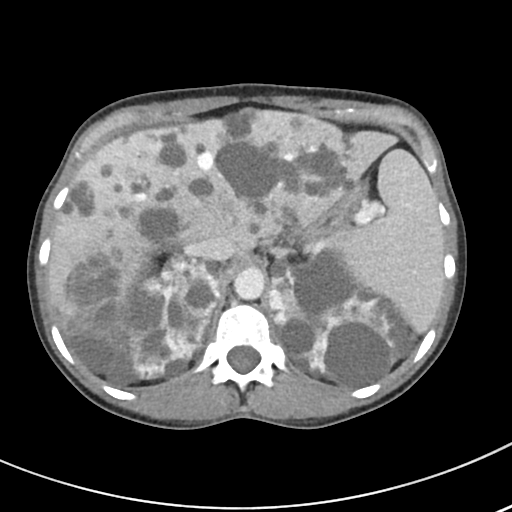
[im 71/85  soft-tissue]
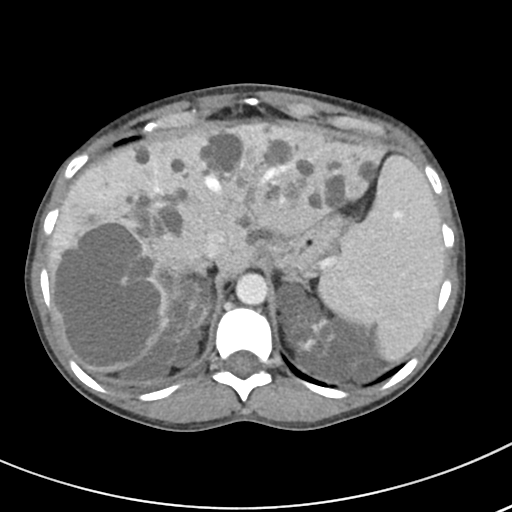
[im 80/85  soft-tissue]
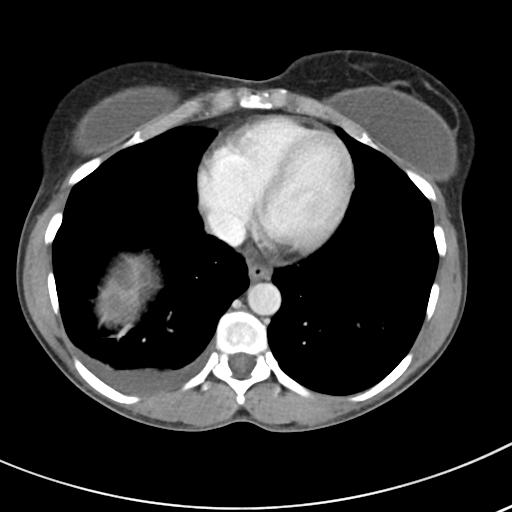

[Series 5: coronal · coronal · 0.65mm/px · 3 of 85 slices shown]
[im 29/85  soft-tissue]
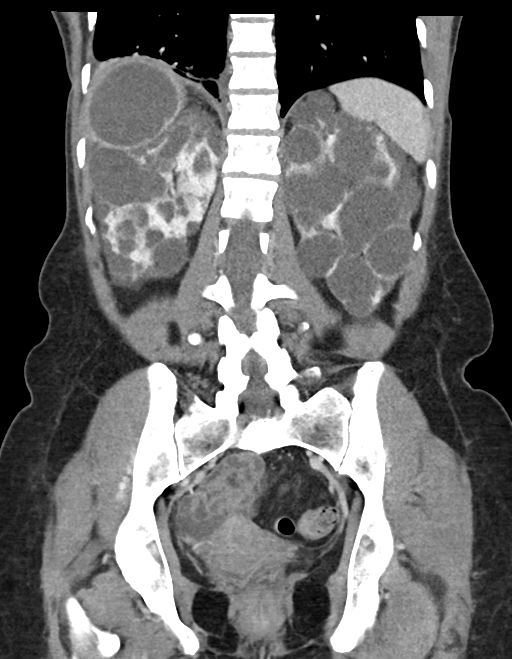
[im 38/85  soft-tissue]
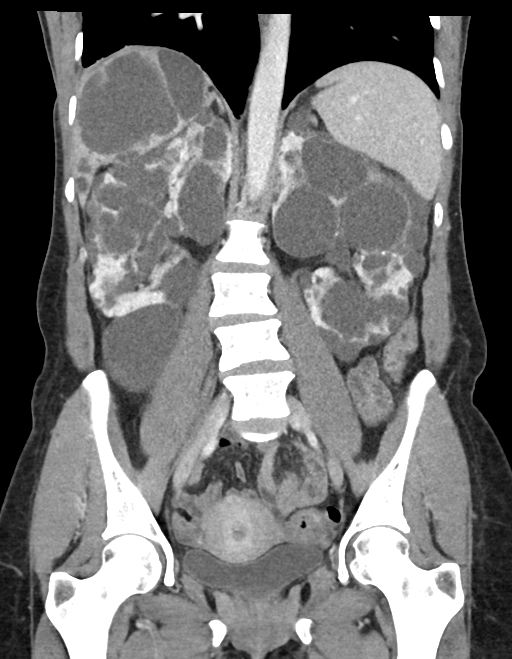
[im 47/85  soft-tissue]
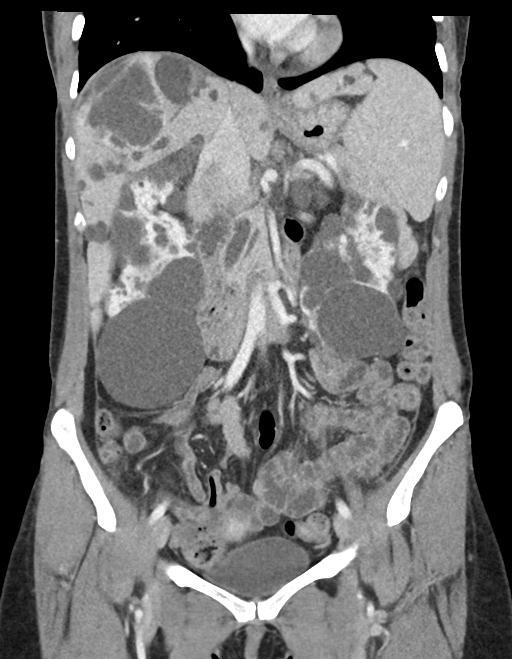

[16 of 46 positions shown; findings below may reference images not displayed]

FINDINGS: Lower chest: Small right pleural effusion. Right base atelectasis.
Heart is normal size.

Hepatobiliary: Innumerable hepatic cysts. Gallbladder unremarkable.
Common bile duct mildly dilated at 9 mm.

Pancreas: No focal abnormality or ductal dilatation.

Spleen: No focal abnormality.  Normal size.

Adrenals/Urinary Tract: Innumerable bilateral renal cysts. No
hydronephrosis. Adrenal glands unremarkable as is urinary bladder.

Stomach/Bowel: Normal appendix. Stomach, large and small bowel
grossly unremarkable.

Vascular/Lymphatic: No evidence of aneurysm or adenopathy.

Reproductive: Uterus and adnexa unremarkable.  No mass.

Other: Small amount of free fluid in the pelvis.  No free air.

Musculoskeletal: No acute bony abnormality.
IMPRESSION: Innumerable hepatic and bilateral renal cysts most compatible with
polycystic kidney disease.

Small right pleural effusion.

Small amount of free fluid in the pelvis, likely physiologic free
fluid.

No acute findings in the abdomen or pelvis.

## 2021-04-05 MED ORDER — ONDANSETRON HCL 4 MG/2ML IJ SOLN
4.0000 mg | Freq: Once | INTRAMUSCULAR | Status: AC
  Administered 2021-04-05: 4 mg via INTRAVENOUS
  Filled 2021-04-05: qty 2

## 2021-04-05 MED ORDER — IOHEXOL 350 MG/ML SOLN
50.0000 mL | Freq: Once | INTRAVENOUS | Status: AC | PRN
  Administered 2021-04-05: 50 mL via INTRAVENOUS

## 2021-04-05 MED ORDER — SODIUM CHLORIDE 0.9 % IV BOLUS
1000.0000 mL | Freq: Once | INTRAVENOUS | Status: AC
  Administered 2021-04-05: 1000 mL via INTRAVENOUS

## 2021-04-05 MED ORDER — POTASSIUM CHLORIDE CRYS ER 20 MEQ PO TBCR
40.0000 meq | EXTENDED_RELEASE_TABLET | Freq: Once | ORAL | Status: AC
  Administered 2021-04-05: 40 meq via ORAL
  Filled 2021-04-05: qty 2

## 2021-04-05 MED ORDER — SODIUM CHLORIDE 0.9 % IV BOLUS
500.0000 mL | Freq: Once | INTRAVENOUS | Status: AC
  Administered 2021-04-05: 500 mL via INTRAVENOUS

## 2021-04-05 MED ORDER — ONDANSETRON 4 MG PO TBDP: 4.0000 mg | ORAL_TABLET | Freq: Three times a day (TID) | ORAL | 0 refills | Status: DC | PRN

## 2021-04-05 MED ORDER — ACETAMINOPHEN 325 MG PO TABS
650.0000 mg | ORAL_TABLET | Freq: Once | ORAL | Status: AC
  Administered 2021-04-05: 650 mg via ORAL
  Filled 2021-04-05: qty 2

## 2021-04-05 NOTE — ED Notes (Signed)
Patient transported to CT 

## 2021-04-05 NOTE — ED Notes (Signed)
Pt c/o nausea with K pills. MD aware, Zofran administered. Pt denies pain

## 2021-04-05 NOTE — Discharge Instructions (Addendum)
Please continue to take your azithromycin.  I will prescribe you additional Zofran for your nausea/vomiting.  I would recommend that you continue to take Tylenol as needed for your fevers.  Please do not exceed 3000 mg of Tylenol per day.  We have obtained blood cultures at this visit.  If they come back positive you should be receiving a phone call regarding this.  Please continue to follow-up with your regular doctor regarding your symptoms.  If you develop any new or worsening symptoms please come back to the emergency department.  It was a pleasure to meet you both.

## 2021-04-05 NOTE — ED Triage Notes (Signed)
Pt arrives pov with c/o N/V/D x 8 days. Recently was treated at [facility]. Pt reports subj fever 98.6-104 . Reports neg Covid tests at home and at [facility]. Currently taking azithromycin

## 2021-04-05 NOTE — ED Provider Notes (Signed)
[facility] Provider Note   CSN: [id number] Arrival date & time: 04/05/21  1759     History Chief Complaint  Patient presents with   Emesis    Judy Manning is a 55 y.o. female.  HPI Patient is a 54 year old female with a history of polycystic kidney disease who presents to the emergency department due to fevers.  Patient states her symptoms started about 8 days ago.  She was evaluated at urgent care for this and states that she had multiple negative home COVID-19 tests, a negative COVID-19 test at urgent care, a recent COVID-19 infection and is vaccinated for COVID-19.  She reports associated nausea, vomiting, as well as diarrhea.  States that she has been taking Tylenol every 6 hours which typically provides short-term improvement in her fevers but her symptoms will quickly return.  She states she has had a T-max of 104 F.  She reports poor p.o. intake due to her sx.  She was initially started on Augmentin which exacerbated her GI symptoms so after 1 day of Augmentin she was then started on azithromycin and states that she will complete a 5-day course tomorrow.  Denies any chest pain, shortness of breath, URI symptoms, or urinary complaints.    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     Family History  Problem Relation Age of Onset   Rheum arthritis Mother    Heart disease Mother        Pacemaker   COPD Mother        Smoker   Skin cancer Mother    Cancer Mother    Stroke Father    Polycystic kidney disease Father    Arthritis Brother    Alzheimer's disease Maternal Grandmother 68   Diabetes Paternal Grandfather    Skin cancer Paternal Grandfather    Cancer Paternal Grandfather    Breast cancer Neg Hx    Colon cancer Neg Hx     Social History   Tobacco Use   Smoking status: Never   Smokeless tobacco: Never  Substance Use Topics   Alcohol use: Not Currently    Comment: rarely ever   Drug use: Never    Home Medications Prior to Admission medications   Medication Sig Start Date End Date Taking? Authorizing Provider  ondansetron (ZOFRAN ODT) 4 MG disintegrating tablet Take 1 tablet (4 mg total) by mouth every 8 (eight) hours as needed for nausea or vomiting. 04/05/21  Yes Rayna Sexton, PA-C  Biotin 10000 MCG TABS Take by mouth.    [provider]  cetirizine (ZYRTEC) 10 MG tablet Take 10 mg by mouth daily as needed for allergies (alternates with claritin).  [provider]  chlorthalidone (HYGROTON) 25 MG tablet TAKE 1 TABLET BY MOUTH  DAILY 03/27/20   Hilts, Legrand Como, MD  Cholecalciferol (VITAMIN D-3) 5000 units TABS Take by mouth.    [provider]  Dapsone 5 % topical gel Apply 1 application topically 2 (two) times daily. 09/18/20   Hilts, Legrand Como, MD  gabapentin (NEURONTIN) 300 MG capsule TAKE 1 CAPSULE BY MOUTH  TWICE DAILY 12/29/20   Hilts, Michael, MD  JYNARQUE 90 & 30 MG TBPK  03/14/18   [provider]  loratadine (CLARITIN) 10 MG tablet Take 10 mg by mouth daily as needed for allergies (alternates with Zyrtec).    [provider]  Multiple Vitamin (MULTIVITAMIN) tablet Take  1 tablet by mouth daily.    [provider]  tretinoin (RETIN-A) 0.025 % cream Apply topically at bedtime. 03/23/21   Hilts, Legrand Como, MD  zolpidem (AMBIEN) 10 MG tablet TAKE 1/2 TO 1 TABLET BY  MOUTH AT BEDTIME AS NEEDED  FOR SLEEP 03/23/21   Hilts, Legrand Como, MD    Allergies    Imitrex [sumatriptan], Ibuprofen, Nitrofurantoin, and Oseltamivir  Review of Systems   Review of Systems  All other systems reviewed and are negative. Ten systems reviewed and are negative for acute change, except as noted in the HPI.   Physical Exam Updated Vital Signs BP 122/72   Pulse 94   Temp 99.3 F (37.4 C) (Oral)   Resp 14   Ht 5' (1.524 m)   Wt 52.2 kg   LMP 03/16/2021 Comment: period has lasted for 2 weeks  SpO2 100%   BMI 22.46 kg/m   Physical Exam Vitals and nursing note reviewed.  Constitutional:      General: She is not in acute distress.    Appearance: Normal appearance. She is normal weight. She is not ill-appearing, toxic-appearing or diaphoretic.  HENT:     Head: Normocephalic and atraumatic.     Right Ear: External ear normal.     Left Ear: External ear normal.     Nose: Nose normal.     Mouth/Throat:     Mouth: Mucous membranes are moist.     Pharynx: Oropharynx is clear. No oropharyngeal exudate or posterior oropharyngeal erythema.  Eyes:     General: No scleral icterus.       Right eye: No discharge.        Left eye: No discharge.     Extraocular Movements: Extraocular movements intact.     Conjunctiva/sclera: Conjunctivae normal.  Cardiovascular:     Rate and Rhythm: Regular rhythm. Tachycardia present.     Pulses: Normal pulses.     Heart sounds: Normal heart sounds. No murmur heard.   No friction rub. No gallop.  Pulmonary:     Effort: Pulmonary effort is normal. No respiratory distress.     Breath sounds: Normal breath sounds. No stridor. No wheezing, rhonchi or rales.  Abdominal:     General: Abdomen is flat. There is distension.     Palpations: Abdomen is  soft.     Tenderness: There is abdominal tenderness.     Comments: Abdomen is soft.  Mildly distended.  Mild tenderness noted along the epigastrium.  Musculoskeletal:        General: Normal range of motion.     Cervical back: Normal range of motion and neck supple. No tenderness.  Skin:    General: Skin is warm and dry.  Neurological:     General: No focal deficit present.  Mental Status: She is alert and oriented to person, place, and time.  Psychiatric:        Mood and Affect: Mood normal.        Behavior: Behavior normal.   ED Results / Procedures / Treatments   Labs (all labs ordered are listed, but only abnormal results are displayed) Labs Reviewed  COMPREHENSIVE METABOLIC PANEL - Abnormal; Notable for the following components:      Result Value   Sodium 134 (*)    Potassium 3.1 (*)    Chloride 93 (*)    Glucose, Bld 101 (*)    BUN 22 (*)    Creatinine, Ser 1.30 (*)    GFR, Estimated 49 (*)    All other components within normal limits  CBC WITH DIFFERENTIAL/PLATELET - Abnormal; Notable for the following components:   Hemoglobin 11.3 (*)    HCT 33.9 (*)    Neutro Abs 8.7 (*)    Abs Immature Granulocytes 0.12 (*)    All other components within normal limits  URINALYSIS, ROUTINE W REFLEX MICROSCOPIC - Abnormal; Notable for the following components:   Hgb urine dipstick SMALL (*)    Ketones, ur 15 (*)    Protein, ur 30 (*)    All other components within normal limits  CULTURE, BLOOD (ROUTINE X 2)  CULTURE, BLOOD (ROUTINE X 2)  LIPASE, BLOOD  LACTIC ACID, PLASMA   EKG None  Radiology CT ABDOMEN PELVIS W CONTRAST  Result Date: 04/05/2021 CLINICAL DATA:  Epigastric pain, fever of unknown origin EXAM: CT ABDOMEN AND PELVIS WITH CONTRAST TECHNIQUE: Multidetector CT imaging of the abdomen and pelvis was performed using the standard protocol following bolus administration of intravenous contrast. CONTRAST:  76m OMNIPAQUE IOHEXOL 350 MG/ML SOLN COMPARISON:  None.  FINDINGS: Lower chest: Small right pleural effusion. Right base atelectasis. Heart is normal size. Hepatobiliary: Innumerable hepatic cysts. Gallbladder unremarkable. Common bile duct mildly dilated at 9 mm. Pancreas: No focal abnormality or ductal dilatation. Spleen: No focal abnormality.  Normal size. Adrenals/Urinary Tract: Innumerable bilateral renal cysts. No hydronephrosis. Adrenal glands unremarkable as is urinary bladder. Stomach/Bowel: Normal appendix. Stomach, large and small bowel grossly unremarkable. Vascular/Lymphatic: No evidence of aneurysm or adenopathy. Reproductive: Uterus and adnexa unremarkable.  No mass. Other: Small amount of free fluid in the pelvis.  No free air. Musculoskeletal: No acute bony abnormality. IMPRESSION: Innumerable hepatic and bilateral renal cysts most compatible with polycystic kidney disease. Small right pleural effusion. Small amount of free fluid in the pelvis, likely physiologic free fluid. No acute findings in the abdomen or pelvis. Electronically Signed   By: KRolm BaptiseM.D.   On: 04/05/2021 20:32   DG Chest Portable 1 View  Result Date: 04/05/2021 CLINICAL DATA:  Fever of unknown origin EXAM: PORTABLE CHEST 1 VIEW COMPARISON:  None. FINDINGS: The heart size and mediastinal contours are within normal limits. Both lungs are clear. The visualized skeletal structures are unremarkable. IMPRESSION: No active disease. Electronically Signed   By: KDonavan FoilM.D.   On: 04/05/2021 19:01    Procedures Procedures   Medications Ordered in ED Medications  sodium chloride 0.9 % bolus 1,000 mL (0 mLs Intravenous Stopped 04/05/21 2006)  ondansetron (ZOFRAN) injection 4 mg (4 mg Intravenous Given 04/05/21 1917)  acetaminophen (TYLENOL) tablet 650 mg (650 mg Oral Given 04/05/21 1919)  sodium chloride 0.9 % bolus 500 mL (500 mLs Intravenous New Bag/Given 04/05/21 2029)  iohexol (OMNIPAQUE) 350 MG/ML injection 50 mL (50 mLs Intravenous Contrast Given 04/05/21 2022)  potassium  chloride SA (KLOR-CON) CR tablet 40 mEq (40 mEq Oral Given 04/05/21 2033)  ondansetron [facility]) injection 4 mg (4 mg Intravenous Given 04/05/21 2043)   ED Course  I have reviewed the triage vital signs and the nursing notes.  Pertinent labs & imaging results that were available during my care of the patient were reviewed by me and considered in my medical decision making (see chart for details).     MDM Rules/Calculators/A&P                          Pt is a 55 y.o. female who presents to the emergency department due to nausea, vomiting, diarrhea, as well as waxing and waning fevers for the past week.  Labs: CBC with a hemoglobin of 11.3, hematocrit of 33.9, neutrophils of 8.7, absolute immature granulocytes of 0.12. CMP with a sodium of 134, potassium of 3.1, chloride of 93, glucose of 101, BUN of 22, creatinine of 1.3, GFR 49. Lipase of 18. Lactic acid of 0.9. Blood cultures obtained. UA showing small hemoglobin, 15 ketones, 30 protein.  Imaging: Chest x-ray is negative. CT scan of the abdomen/pelvis with contrast shows innumerable hepatic and bilateral renal cyst most compatible with polycystic kidney disease.  Small right pleural effusion.  Small amount of free fluid in the pelvis which is likely physiologic free fluid.  No acute findings in the abdomen or pelvis.  I, Rayna Sexton, PA-C, personally reviewed and evaluated these images and lab results as part of my medical decision-making.  Unsure the source of the patient's symptoms.  She denies any recent travel or hematochezia.  Patient with a low-grade fever of 99.3 F upon arrival.  Also tachycardic around 118 bpm.  She was given Tylenol, 1.5 L of IV fluids, as well as Zofran.  She reports significant improvement in her nausea.  Heart rate has improved.  Vital signs reassuring.  Lab work mostly reassuring.  She did have mild neutrophilia as well as mildly elevated absolute immature granulocytes.  CMP with a sodium of 134,  potassium of 3.1, chloride of 93.  Patient given 1.5 L of normal saline as well as 40 mEq of Klor-Con for this.  Electrolyte derangements are likely secondary to patient's persistent nausea, vomiting, as well as diarrhea.  I obtained a chest x-ray which was negative as well as a CT scan of the abdomen/pelvis which shows no acute findings in the abdomen or pelvis.  Blood cultures obtained at this visit.  Feel that she is stable for discharge at this time and she is agreeable.  Will discharge on a course of ODT Zofran for her symptoms.  Discussed return precautions in length.  Her questions were answered and she was amicable at the time of discharge.  Note: Portions of this report may have been transcribed using voice recognition software. Every effort was made to ensure accuracy; however, inadvertent computerized transcription errors may be present.   Final Clinical Impression(s) / ED Diagnoses Final diagnoses:  Nausea vomiting and diarrhea  Hypokalemia  Fever of unknown origin   Rx / DC Orders ED Discharge Orders          Ordered    ondansetron (ZOFRAN ODT) 4 MG disintegrating tablet  Every 8 hours PRN        04/05/21 2058             Rayna Sexton, PA-C 04/05/21 2100    Hayden Rasmussen, MD 04/05/21 2106

## 2021-04-08 ENCOUNTER — Other Ambulatory Visit: Payer: Self-pay

## 2021-04-08 ENCOUNTER — Emergency Department (HOSPITAL_BASED_OUTPATIENT_CLINIC_OR_DEPARTMENT_OTHER): Payer: 59

## 2021-04-08 ENCOUNTER — Encounter (HOSPITAL_BASED_OUTPATIENT_CLINIC_OR_DEPARTMENT_OTHER): Payer: Self-pay | Admitting: *Deleted

## 2021-04-08 ENCOUNTER — Inpatient Hospital Stay (HOSPITAL_COMMUNITY): Payer: 59

## 2021-04-08 ENCOUNTER — Inpatient Hospital Stay (HOSPITAL_BASED_OUTPATIENT_CLINIC_OR_DEPARTMENT_OTHER)
Admission: EM | Admit: 2021-04-08 | Discharge: 2021-04-17 | DRG: 442 | Disposition: A | Discharge status: Home / Self Care | Payer: 59 | Attending: Internal Medicine | Admitting: Internal Medicine

## 2021-04-08 DIAGNOSIS — Z823 Family history of stroke: Secondary | ICD-10-CM | POA: Diagnosis not present

## 2021-04-08 DIAGNOSIS — K5900 Constipation, unspecified: Secondary | ICD-10-CM | POA: Diagnosis not present

## 2021-04-08 DIAGNOSIS — Z20822 Contact with and (suspected) exposure to covid-19: Secondary | ICD-10-CM | POA: Diagnosis present

## 2021-04-08 DIAGNOSIS — Z8616 Personal history of COVID-19: Secondary | ICD-10-CM | POA: Diagnosis not present

## 2021-04-08 DIAGNOSIS — Z825 Family history of asthma and other chronic lower respiratory diseases: Secondary | ICD-10-CM | POA: Diagnosis not present

## 2021-04-08 DIAGNOSIS — Z808 Family history of malignant neoplasm of other organs or systems: Secondary | ICD-10-CM

## 2021-04-08 DIAGNOSIS — Z820 Family history of epilepsy and other diseases of the nervous system: Secondary | ICD-10-CM | POA: Diagnosis not present

## 2021-04-08 DIAGNOSIS — D500 Iron deficiency anemia secondary to blood loss (chronic): Secondary | ICD-10-CM | POA: Diagnosis present

## 2021-04-08 DIAGNOSIS — R140 Abdominal distension (gaseous): Secondary | ICD-10-CM

## 2021-04-08 DIAGNOSIS — R6510 Systemic inflammatory response syndrome (SIRS) of non-infectious origin without acute organ dysfunction: Secondary | ICD-10-CM | POA: Diagnosis present

## 2021-04-08 DIAGNOSIS — I129 Hypertensive chronic kidney disease with stage 1 through stage 4 chronic kidney disease, or unspecified chronic kidney disease: Secondary | ICD-10-CM | POA: Diagnosis present

## 2021-04-08 DIAGNOSIS — R791 Abnormal coagulation profile: Secondary | ICD-10-CM | POA: Diagnosis present

## 2021-04-08 DIAGNOSIS — Z8669 Personal history of other diseases of the nervous system and sense organs: Secondary | ICD-10-CM

## 2021-04-08 DIAGNOSIS — K750 Abscess of liver: Secondary | ICD-10-CM | POA: Diagnosis not present

## 2021-04-08 DIAGNOSIS — N182 Chronic kidney disease, stage 2 (mild): Secondary | ICD-10-CM | POA: Diagnosis present

## 2021-04-08 DIAGNOSIS — Z79899 Other long term (current) drug therapy: Secondary | ICD-10-CM

## 2021-04-08 DIAGNOSIS — Q612 Polycystic kidney, adult type: Secondary | ICD-10-CM

## 2021-04-08 DIAGNOSIS — E876 Hypokalemia: Secondary | ICD-10-CM | POA: Diagnosis present

## 2021-04-08 DIAGNOSIS — Q446 Cystic disease of liver: Secondary | ICD-10-CM

## 2021-04-08 DIAGNOSIS — Z888 Allergy status to other drugs, medicaments and biological substances status: Secondary | ICD-10-CM

## 2021-04-08 DIAGNOSIS — K838 Other specified diseases of biliary tract: Secondary | ICD-10-CM | POA: Diagnosis present

## 2021-04-08 DIAGNOSIS — R1011 Right upper quadrant pain: Secondary | ICD-10-CM

## 2021-04-08 DIAGNOSIS — R778 Other specified abnormalities of plasma proteins: Secondary | ICD-10-CM | POA: Diagnosis not present

## 2021-04-08 DIAGNOSIS — J90 Pleural effusion, not elsewhere classified: Secondary | ICD-10-CM | POA: Diagnosis present

## 2021-04-08 DIAGNOSIS — I248 Other forms of acute ischemic heart disease: Secondary | ICD-10-CM | POA: Diagnosis present

## 2021-04-08 DIAGNOSIS — N281 Cyst of kidney, acquired: Secondary | ICD-10-CM

## 2021-04-08 DIAGNOSIS — I951 Orthostatic hypotension: Secondary | ICD-10-CM | POA: Diagnosis not present

## 2021-04-08 DIAGNOSIS — R52 Pain, unspecified: Secondary | ICD-10-CM

## 2021-04-08 DIAGNOSIS — R059 Cough, unspecified: Secondary | ICD-10-CM

## 2021-04-08 DIAGNOSIS — K7689 Other specified diseases of liver: Secondary | ICD-10-CM | POA: Diagnosis present

## 2021-04-08 DIAGNOSIS — Z8249 Family history of ischemic heart disease and other diseases of the circulatory system: Secondary | ICD-10-CM

## 2021-04-08 DIAGNOSIS — Z833 Family history of diabetes mellitus: Secondary | ICD-10-CM

## 2021-04-08 DIAGNOSIS — Z8261 Family history of arthritis: Secondary | ICD-10-CM

## 2021-04-08 DIAGNOSIS — D75839 Thrombocytosis, unspecified: Secondary | ICD-10-CM | POA: Diagnosis present

## 2021-04-08 DIAGNOSIS — E871 Hypo-osmolality and hyponatremia: Secondary | ICD-10-CM | POA: Diagnosis present

## 2021-04-08 DIAGNOSIS — Z8271 Family history of polycystic kidney: Secondary | ICD-10-CM

## 2021-04-08 DIAGNOSIS — R079 Chest pain, unspecified: Secondary | ICD-10-CM | POA: Diagnosis not present

## 2021-04-08 DIAGNOSIS — Q613 Polycystic kidney, unspecified: Secondary | ICD-10-CM

## 2021-04-08 LAB — I-STAT VENOUS BLOOD GAS, ED
Acid-Base Excess: 6 mmol/L — ABNORMAL HIGH (ref 0.0–2.0)
Bicarbonate: 31.8 mmol/L — ABNORMAL HIGH (ref 20.0–28.0)
Calcium, Ion: 1.18 mmol/L (ref 1.15–1.40)
HCT: 33 % — ABNORMAL LOW (ref 36.0–46.0)
Hemoglobin: 11.2 g/dL — ABNORMAL LOW (ref 12.0–15.0)
O2 Saturation: 33 %
Potassium: 3.1 mmol/L — ABNORMAL LOW (ref 3.5–5.1)
Sodium: 134 mmol/L — ABNORMAL LOW (ref 135–145)
TCO2: 33 mmol/L — ABNORMAL HIGH (ref 22–32)
pCO2, Ven: 49.9 mmHg (ref 44.0–60.0)
pH, Ven: 7.413 (ref 7.250–7.430)
pO2, Ven: 21 mmHg — CL (ref 32.0–45.0)

## 2021-04-08 LAB — COMPREHENSIVE METABOLIC PANEL
ALT: 32 U/L (ref 0–44)
AST: 45 U/L — ABNORMAL HIGH (ref 15–41)
Albumin: 3.5 g/dL (ref 3.5–5.0)
Alkaline Phosphatase: 106 U/L (ref 38–126)
Anion gap: 12 (ref 5–15)
BUN: 13 mg/dL (ref 6–20)
CO2: 28 mmol/L (ref 22–32)
Calcium: 9 mg/dL (ref 8.9–10.3)
Chloride: 94 mmol/L — ABNORMAL LOW (ref 98–111)
Creatinine, Ser: 1.28 mg/dL — ABNORMAL HIGH (ref 0.44–1.00)
GFR, Estimated: 50 mL/min — ABNORMAL LOW (ref >60)
Glucose, Bld: 102 mg/dL — ABNORMAL HIGH (ref 70–99)
Potassium: 3.1 mmol/L — ABNORMAL LOW (ref 3.5–5.1)
Sodium: 134 mmol/L — ABNORMAL LOW (ref 135–145)
Total Bilirubin: 0.8 mg/dL (ref 0.3–1.2)
Total Protein: 7.6 g/dL (ref 6.5–8.1)

## 2021-04-08 LAB — URINALYSIS, ROUTINE W REFLEX MICROSCOPIC
Bilirubin Urine: NEGATIVE
Glucose, UA: NEGATIVE mg/dL
Ketones, ur: NEGATIVE mg/dL
Nitrite: NEGATIVE
Specific Gravity, Urine: 1.009 (ref 1.005–1.030)
pH: 6 (ref 5.0–8.0)

## 2021-04-08 LAB — CBC WITH DIFFERENTIAL/PLATELET
Abs Immature Granulocytes: 0.13 10*3/uL — ABNORMAL HIGH (ref 0.00–0.07)
Basophils Absolute: 0 10*3/uL (ref 0.0–0.1)
Basophils Relative: 0 %
Eosinophils Absolute: 0.1 10*3/uL (ref 0.0–0.5)
Eosinophils Relative: 0 %
HCT: 32.7 % — ABNORMAL LOW (ref 36.0–46.0)
Hemoglobin: 10.6 g/dL — ABNORMAL LOW (ref 12.0–15.0)
Immature Granulocytes: 1 %
Lymphocytes Relative: 6 %
Lymphs Abs: 0.8 10*3/uL (ref 0.7–4.0)
MCH: 26.5 pg (ref 26.0–34.0)
MCHC: 32.4 g/dL (ref 30.0–36.0)
MCV: 81.8 fL (ref 80.0–100.0)
Monocytes Absolute: 0.9 10*3/uL (ref 0.1–1.0)
Monocytes Relative: 7 %
Neutro Abs: 11.7 10*3/uL — ABNORMAL HIGH (ref 1.7–7.7)
Neutrophils Relative %: 86 %
Platelets: 390 10*3/uL (ref 150–400)
RBC: 4 MIL/uL (ref 3.87–5.11)
RDW: 13.8 % (ref 11.5–15.5)
WBC: 13.6 10*3/uL — ABNORMAL HIGH (ref 4.0–10.5)
nRBC: 0 % (ref 0.0–0.2)

## 2021-04-08 LAB — LIPASE, BLOOD: Lipase: 15 U/L (ref 11–51)

## 2021-04-08 LAB — RESP PANEL BY RT-PCR (FLU A&B, COVID) ARPGX2
Influenza A by PCR: NEGATIVE
Influenza B by PCR: NEGATIVE
SARS Coronavirus 2 by RT PCR: NEGATIVE

## 2021-04-08 LAB — TROPONIN I (HIGH SENSITIVITY): Troponin I (High Sensitivity): 26 ng/L — ABNORMAL HIGH (ref <18)

## 2021-04-08 LAB — LACTIC ACID, PLASMA: Lactic Acid, Venous: 0.8 mmol/L (ref 0.5–1.9)

## 2021-04-08 IMAGING — MR MR ABDOMEN WO/W CM MRCP
16 of 22 series · 33 of 48 positions shown · IV contrast (gadavist)
Comparison: No prior abdominal MRI. Abdominal ultrasound
[DATE]. CT the abdomen and pelvis [DATE].

CLINICAL DATA: 54-year-old female with history of right upper
quadrant abdominal pain. Possible common bile duct dilatation noted
on recent abdominal ultrasound. Follow-up study.



[Series 3: T2 fat-sat · axial · 6.0mm · 1.25mm/px · 1 of 36 slices shown]
[im 1/36]
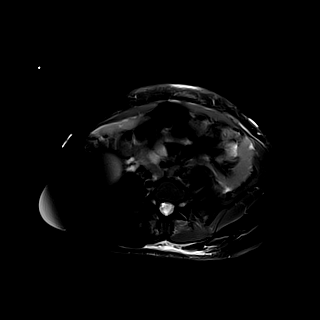

[Series 5: T2 · coronal · 6.0mm · 1.48mm/px · 1 of 30 slices shown (1 of 2)]
[im 1/30]
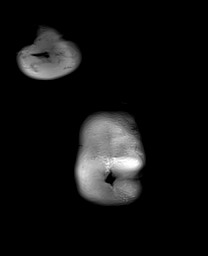

[Series 7: DWI · axial · 6.0mm · 1.49mm/px · z∈[-124,+157]mm · 2 of 80 slices shown (1 of 2)]
[im 1/80]
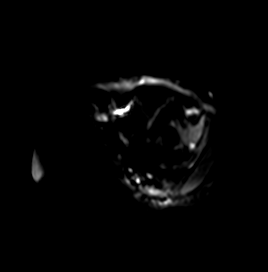
[im 80/80]
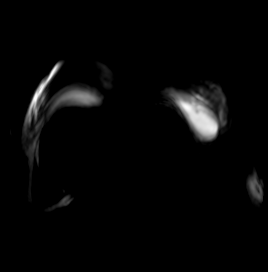

[Series 8: DWI · axial · 6.0mm · 1.49mm/px · 1 of 40 slices shown (2 of 2)]
[im 1/40]
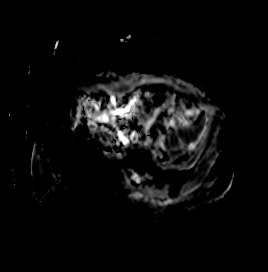

[Series 9: T1 · axial · 3.0mm · 1.25mm/px · z∈[-92,+145]mm · 2 of 80 slices shown (1 of 2)]
[im 1/80]
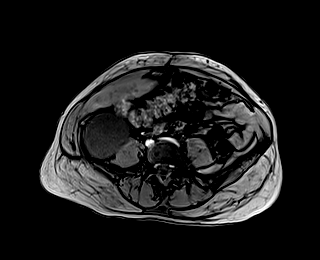
[im 80/80]
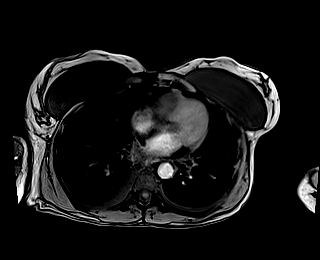

[Series 10: T1 · axial · 3.0mm · 1.25mm/px · z∈[-92,+145]mm · 3 of 80 slices shown (2 of 2)]
[im 1/80]
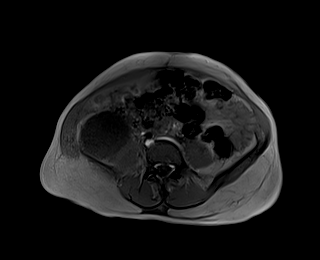
[im 40/80]
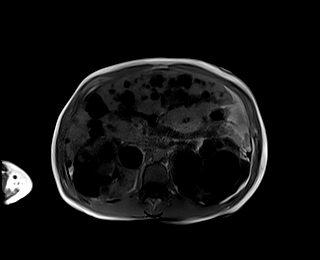
[im 80/80]
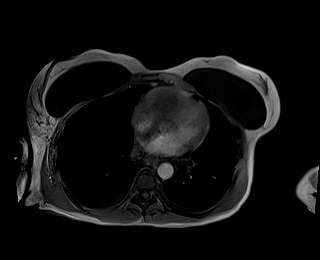

[Series 11: cor obl thk · sagittal · 50.0mm · 0.78mm/px · 1 of 9 slices shown]
[im 1/9]
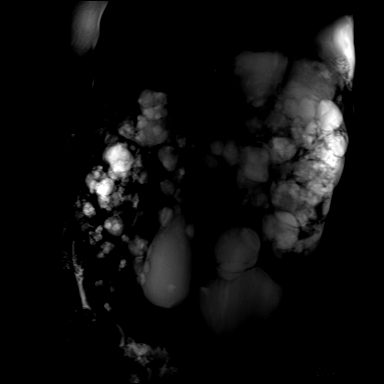

[Series 12: cor_3d_spc_trig-resp · 1 of 14 slices shown]
[im 1/14]
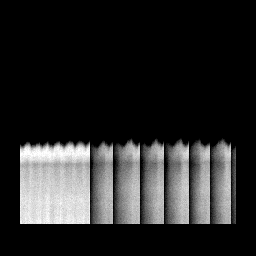

[Series 13: cor_3d_spc_trig · coronal · 1.0mm · 0.49mm/px · 3 of 72 slices shown]
[im 1/72]
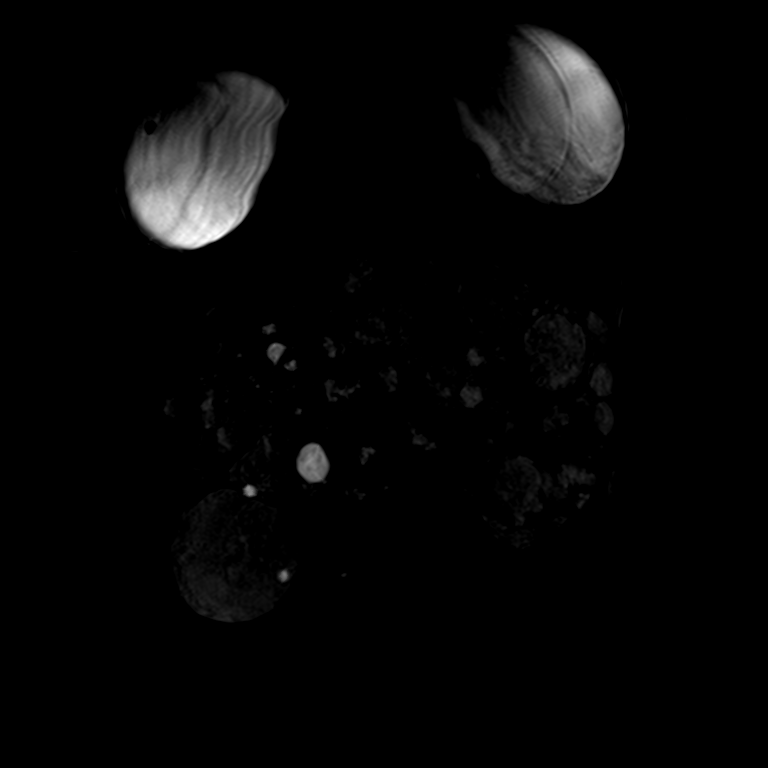
[im 36/72]
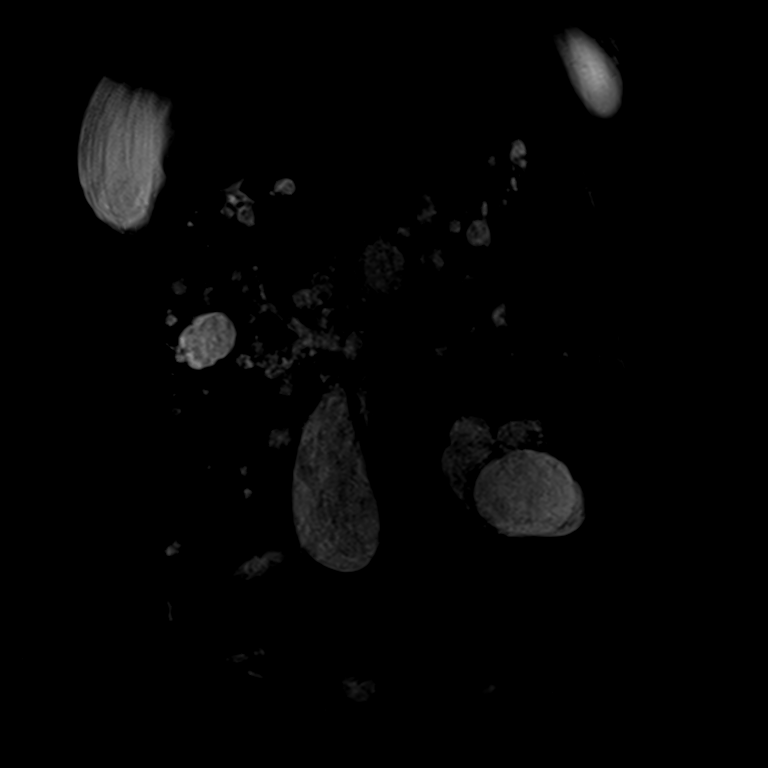
[im 72/72]
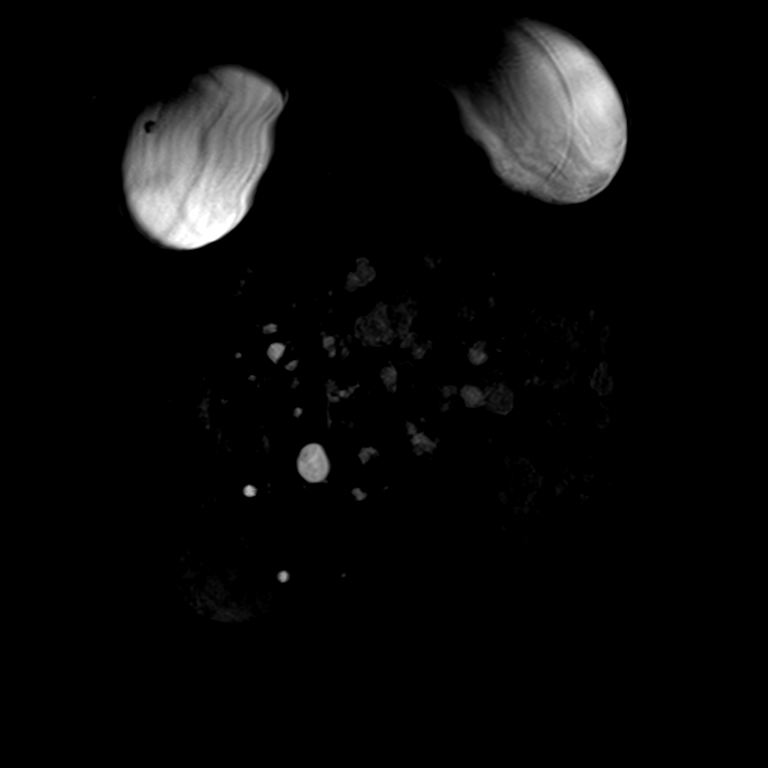

[Series 15: T2 · axial · 6.0mm · 1.56mm/px · 1 of 35 slices shown (2 of 2)]
[im 1/35]
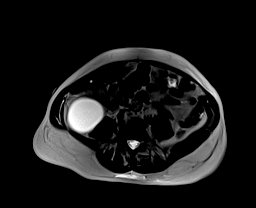

[Series 17: T1 dynamic · axial · 3.0mm · 1.25mm/px · z∈[-96,+141]mm · 3 of 80 slices shown (1 of 6)]
[im 1/80]
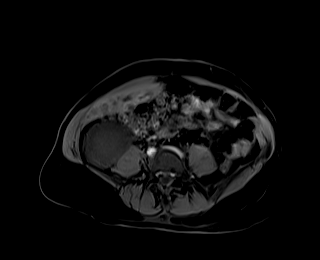
[im 40/80]
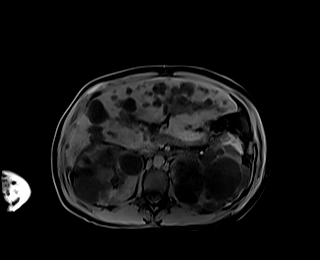
[im 80/80]
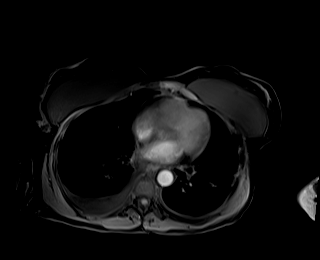

[Series 21: T1 dynamic · axial · 3.0mm · 1.25mm/px · z∈[-96,+141]mm · 3 of 80 slices shown (2 of 6)]
[im 1/80]
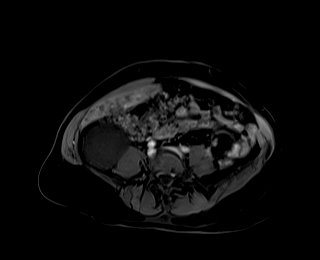
[im 40/80]
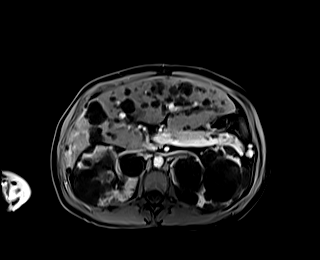
[im 80/80]
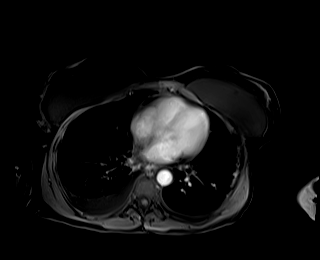

[Series 22: T1 dynamic · axial · 3.0mm · 1.25mm/px · z∈[-96,+141]mm · 3 of 80 slices shown (3 of 6)]
[im 1/80]
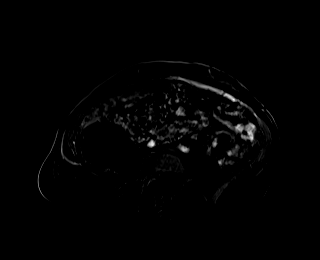
[im 40/80]
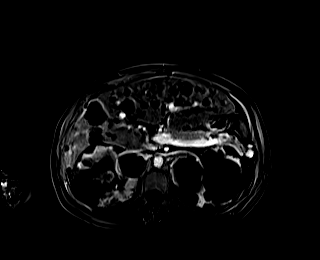
[im 80/80]
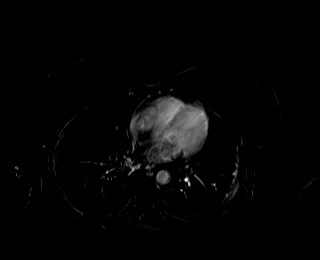

[Series 25: T1 dynamic · axial · 3.0mm · 1.25mm/px · z∈[-96,+141]mm · 3 of 80 slices shown (4 of 6)]
[im 1/80]
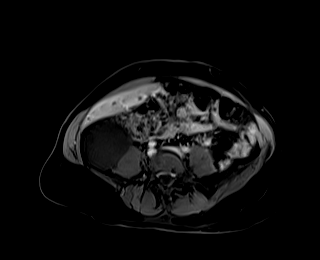
[im 40/80]
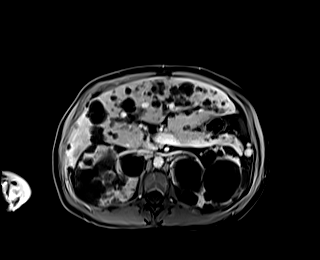
[im 80/80]
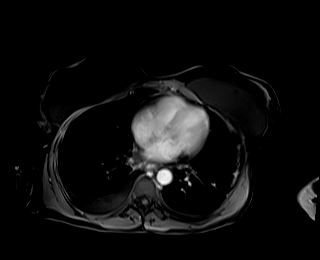

[Series 26: T1 dynamic · axial · 3.0mm · 1.25mm/px · z∈[-96,+141]mm · 3 of 80 slices shown (5 of 6)]
[im 1/80]
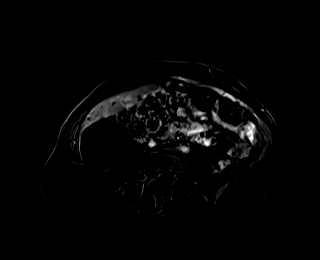
[im 40/80]
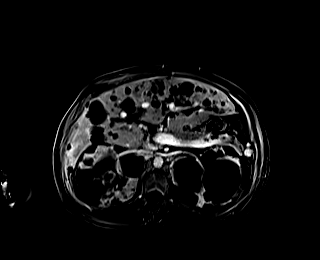
[im 80/80]
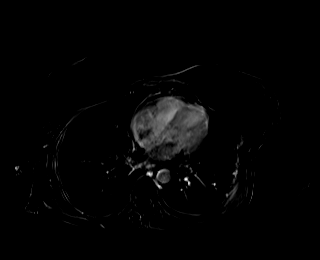

[Series 29: T1 dynamic · axial · 3.0mm · 1.25mm/px · z∈[-96,+21]mm · 2 of 80 slices shown (6 of 6)]
[im 1/80]
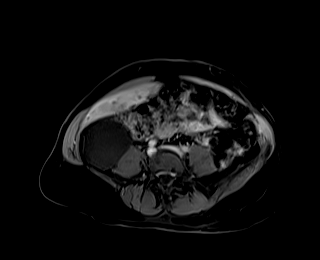
[im 40/80]
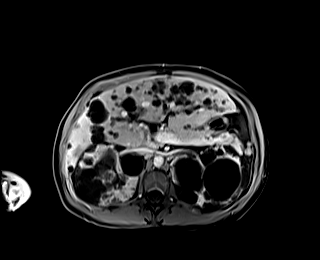

[33 of 48 positions shown; findings below may reference images not displayed]

FINDINGS: Lower chest: Small bilateral pleural effusions (right greater than
left) lying dependently. Bilateral breast implants are incidentally
noted.

Hepatobiliary: There are innumerable hepatic lesions which are
predominantly T1 hypointense, T2 hyperintense, and demonstrate no
internal enhancement, compatible with a combination of simple cysts
and biliary hamartomas. Some of the larger cyst demonstrate varying
degrees of complexity, including the largest lesion involving
portions of segments 7 and 8 (axial image 9 of series 15 and coronal
image 20 of series 5) which measures 8.4 x 7.6 x 7.3 cm and has
several internal septations measuring up to 4 mm in thickness, which
demonstrates some low-level areas of enhancement. One of the
loculations of the cyst also contains some dependent T1 hyperintense
material (axial image 15 of series 17), most compatible with a
proteinaceous/hemorrhagic debris. No other definite enhancing
hepatic lesions are confidently identified. Gallbladder is normal in
appearance. No intrahepatic biliary ductal dilatation noted on MRCP
images. However, common bile duct is dilated up to 11 mm in the
porta hepatis. There are no filling defects within the common bile
duct to suggest choledocholithiasis. Ductal dilatation terminates
abruptly at the level of the ampulla.

Pancreas: No pancreatic mass. No pancreatic ductal dilatation. No
pancreatic or peripancreatic fluid collections or inflammatory
changes.

Spleen:  Unremarkable.

Adrenals/Urinary Tract: Kidneys are essentially completely replaced
with innumerable cystic lesions of varying degrees of complexity.
The majority of these are T1 hypointense, T2 hyperintense and do not
enhance, compatible with simple cysts. Many of these lesions
demonstrate some thin internal in septations (Bosniak class 2). Some
small lesions demonstrate T1 hyperintensity and mild T2
hypointensity, without definite internal enhancement, compatible
with proteinaceous/hemorrhagic cysts. No definite suspicious renal
lesions are confidently identified on today's examination. No
hydroureteronephrosis in the visualized portions of the abdomen.
Bilateral adrenal glands are normal in appearance.

Stomach/Bowel: Visualized portions are unremarkable.

Vascular/Lymphatic: No aneurysm identified in the visualized
abdominal vasculature. No lymphadenopathy noted in the abdomen.

Other: No significant volume of ascites noted in the visualized
portions of the peritoneal cavity.

Musculoskeletal: No aggressive appearing osseous lesions are noted
in the visualized portions of the skeleton.
IMPRESSION: 1. Innumerable cysts of varying degrees of complexity in the kidneys
bilaterally and the liver, compatible with autosomal dominant
polycystic kidney disease (ADPKD).
2. Dilatation of the common bile duct which measures up to 10 mm in
diameter. However, there is no choledocholithiasis, evidence of
obstructing mass, pancreatic ductal dilatation, or intrahepatic
biliary ductal dilatation to clearly indicate obstruction. This is
favored to reflect benign dilatation of the common bile duct, likely
related to autosomal dominant polycystic kidney disease, as there is
a known association.
3. Small bilateral pleural effusions (right greater than left).
4. Additional incidental findings, as above.

## 2021-04-08 IMAGING — CT CT ANGIO CHEST
2 of 7 series · 18 of 46 positions shown · IV contrast (OMNIPAQUE)
Comparison: None.

CLINICAL DATA: PE suspected, high prob. Right rib and back pain.
Fever.

EXAM:
CT ANGIOGRAPHY CHEST WITH CONTRAST
TECHNIQUE: Multidetector CT imaging of the chest was performed using the
standard protocol during bolus administration of intravenous
contrast. Multiplanar CT image reconstructions and MIPs were
obtained to evaluate the vascular anatomy.
CONTRAST:  60mL OMNIPAQUE IOHEXOL 350 MG/ML SOLN

[Series 6: thins · axial · 0.61mm/px · z∈[-302,-100]mm · 16 of 230 slices shown]
[im 14/230  lung]
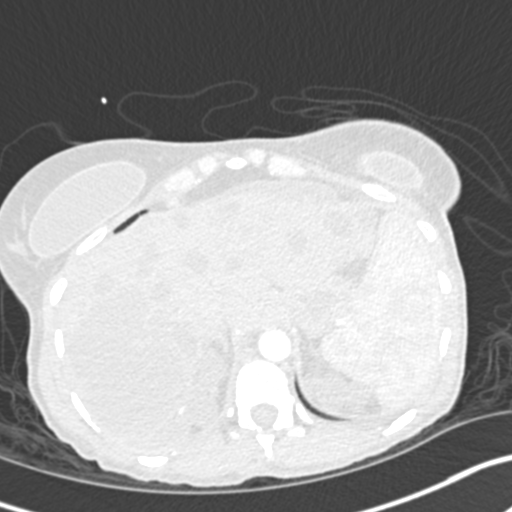
[im 27/230  soft-tissue]
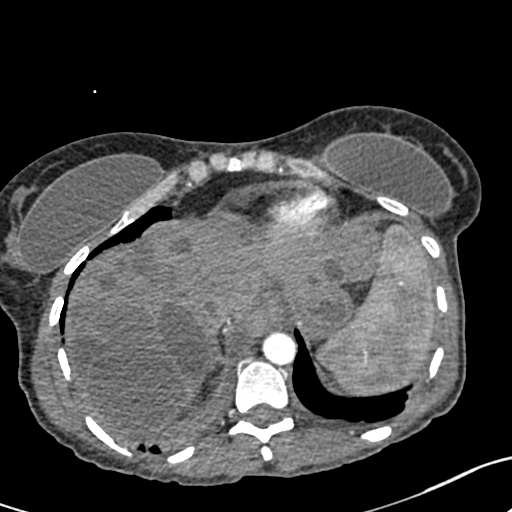
[im 41/230  lung]
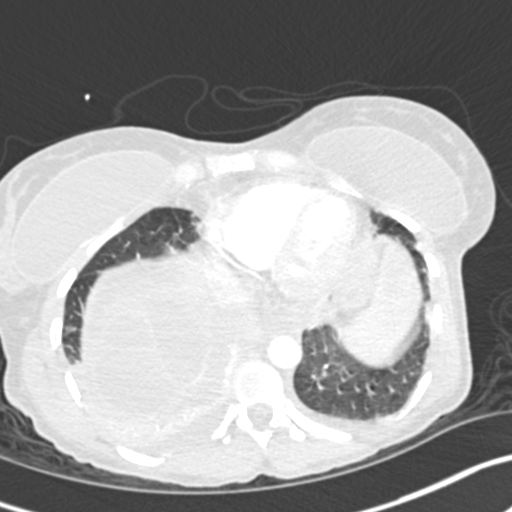
[im 54/230  soft-tissue]
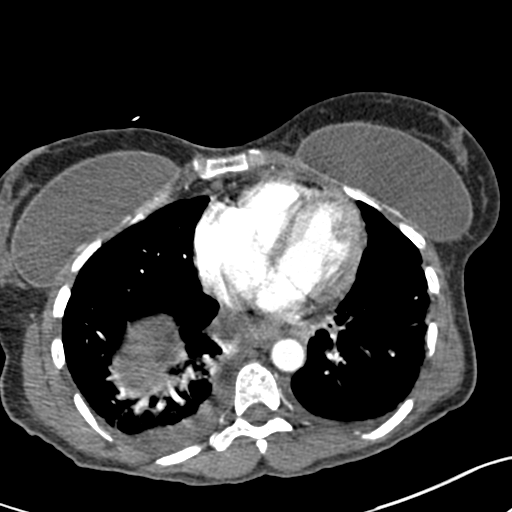
[im 68/230  lung]
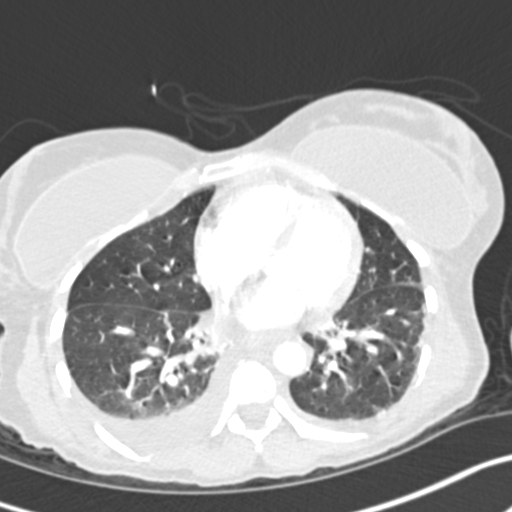
[im 81/230  soft-tissue]
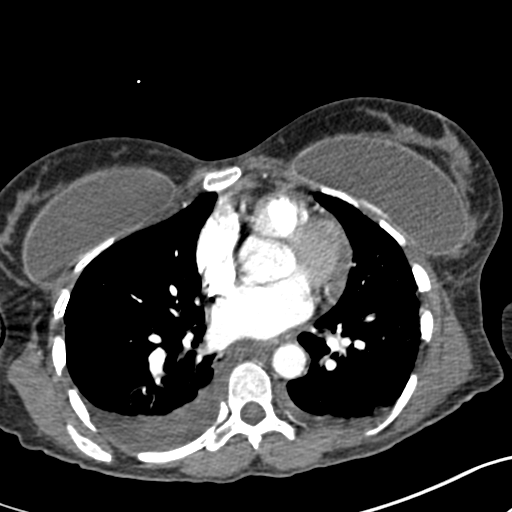
[im 95/230  lung]
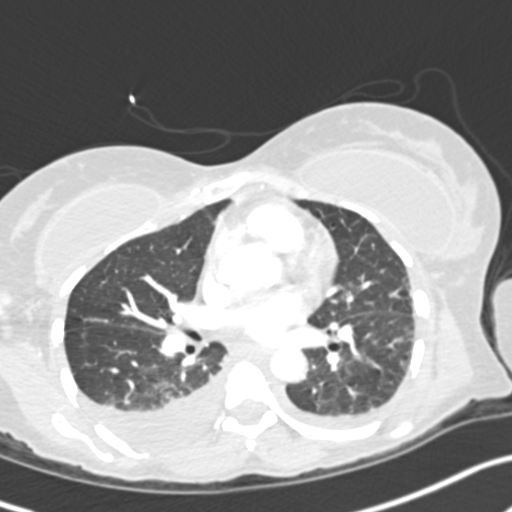
[im 108/230  soft-tissue]
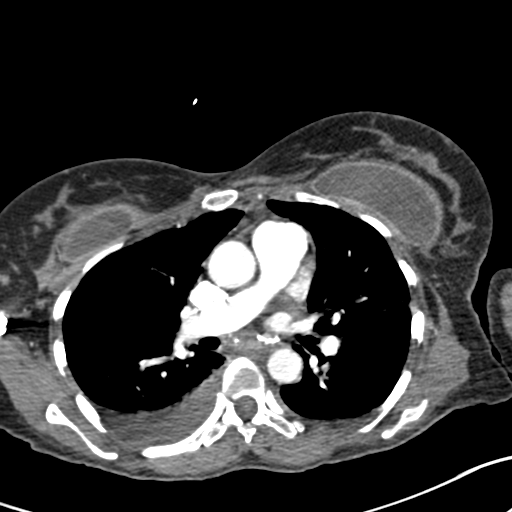
[im 122/230  lung]
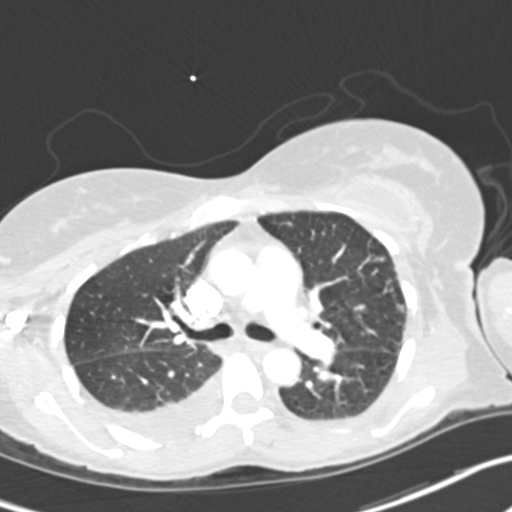
[im 135/230  soft-tissue]
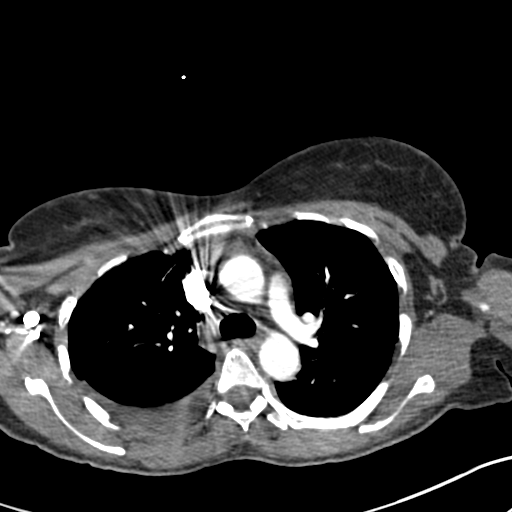
[im 149/230  lung]
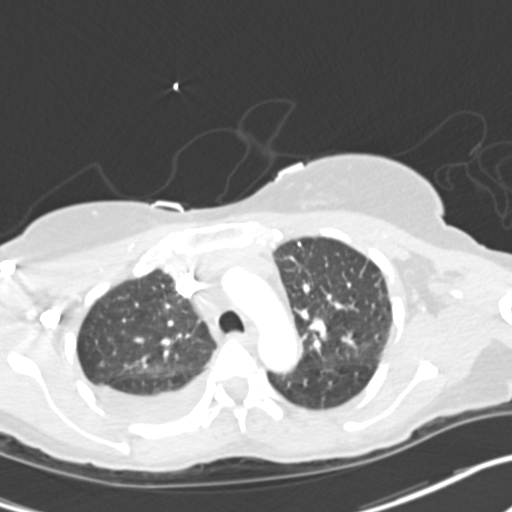
[im 162/230  soft-tissue]
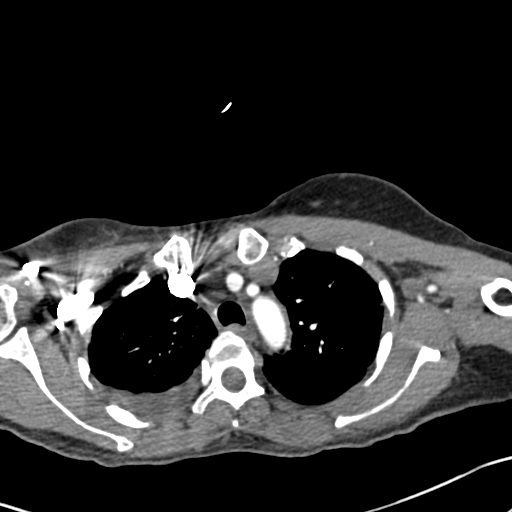
[im 176/230  lung]
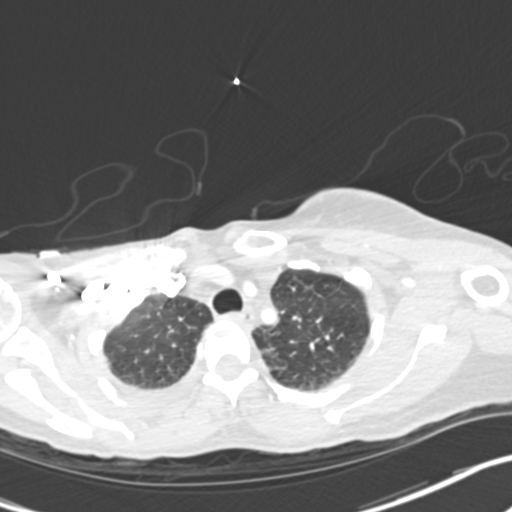
[im 189/230  soft-tissue]
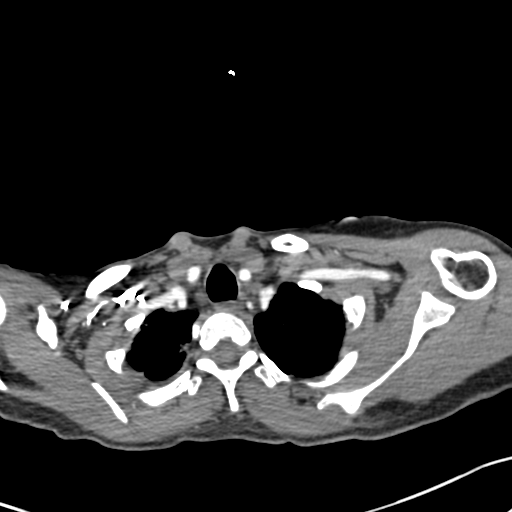
[im 203/230  lung]
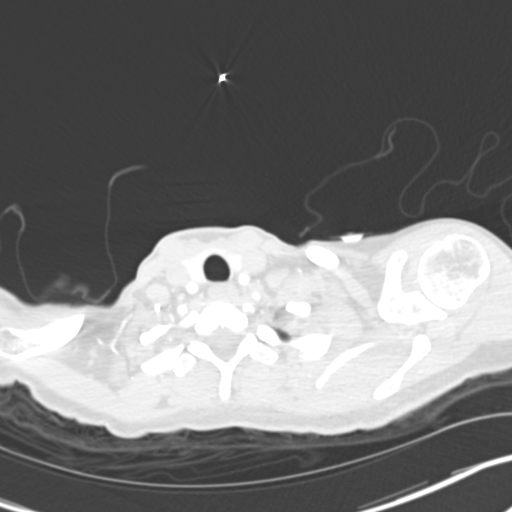
[im 216/230  soft-tissue]
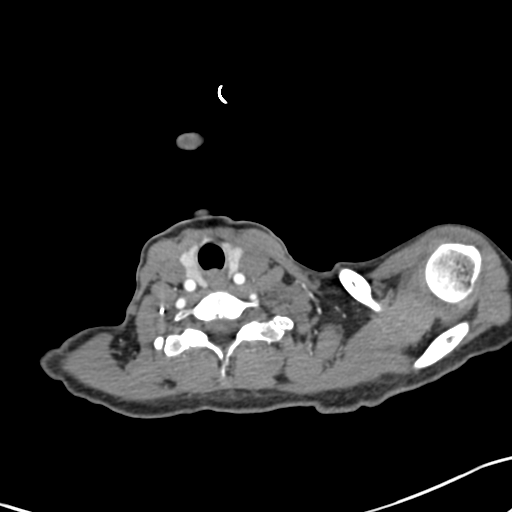

[Series 8: coronal mpr · coronal · 0.46mm/px · 2 of 66 slices shown]
[im 22/66  soft-tissue]
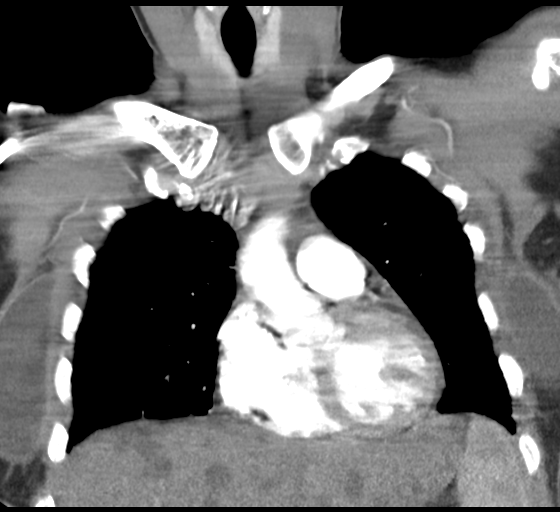
[im 44/66  soft-tissue]
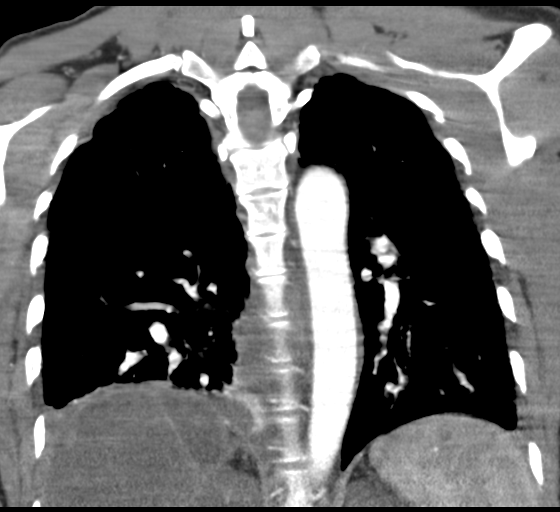

[18 of 46 positions shown; findings below may reference images not displayed]

FINDINGS: Cardiovascular: No filling defects in the pulmonary arteries to
suggest pulmonary emboli. Heart is normal size. Aorta is normal
caliber.

Mediastinum/Nodes: No mediastinal, hilar, or axillary adenopathy.
Trachea and esophagus are unremarkable. Thyroid unremarkable.

Lungs/Pleura: Moderate right pleural effusion and small left pleural
effusion. Dependent atelectasis in the lower lobes bilaterally.

Upper Abdomen: Numerous cysts in the visualized liver and kidneys
compatible with polycystic kidney disease.

Musculoskeletal: Bilateral breast implants. Chest wall soft tissues
are unremarkable. No acute bony abnormality.

Review of the MIP images confirms the above findings.
IMPRESSION: No evidence of pulmonary embolus.

Moderate right pleural effusion and small left pleural effusion.
Dependent atelectasis.

## 2021-04-08 IMAGING — DX DG CHEST 1V PORT
1 series · 1 of 1 positions shown · non-contrast
Comparison: [DATE]

CLINICAL DATA: Shortness of breath and fever.

EXAM:
PORTABLE CHEST 1 VIEW

[chest]
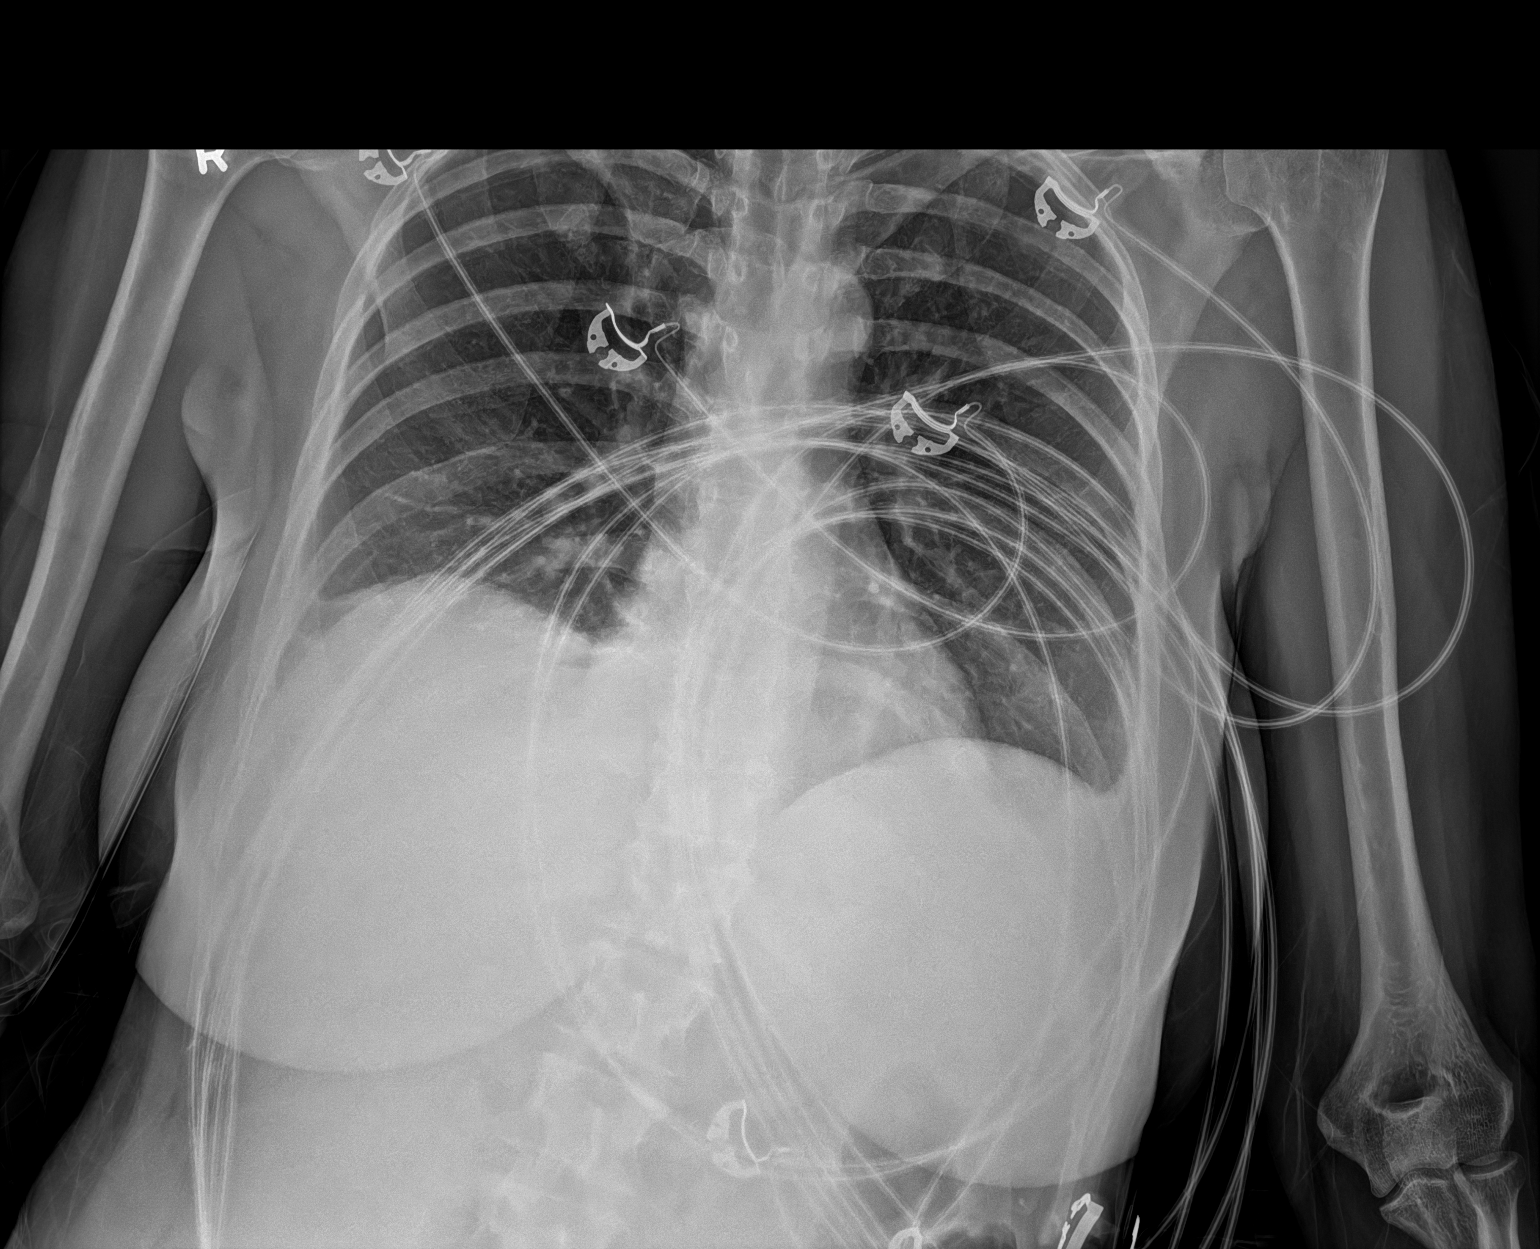

[1 of 1 positions shown; findings below may reference images not displayed]

FINDINGS: The cardiac silhouette, mediastinal and hilar contours are within
normal limits. A small left effusion is now noted. No infiltrates or
edema. No pneumothorax. The bony thorax is intact.
IMPRESSION: Small left pleural effusion.

## 2021-04-08 IMAGING — US US ABDOMEN COMPLETE
1 series · 13 of 25 positions shown · non-contrast
Comparison: CT abdomen pelvis, [DATE]

CLINICAL DATA: Right upper quadrant pain for 3 days, history of
polycystic kidney disease

EXAM:
ABDOMEN ULTRASOUND COMPLETE

[Series 1: us abdomen complete · 13 of 100 slices shown]
[im 1/100]
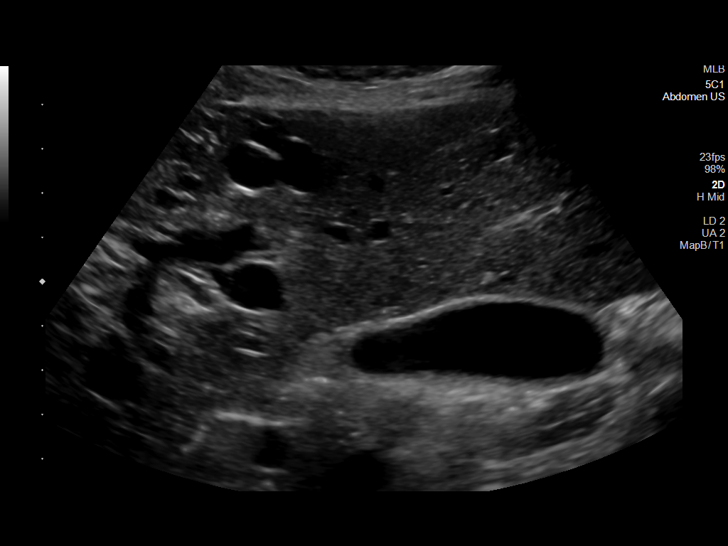
[im 9/100]
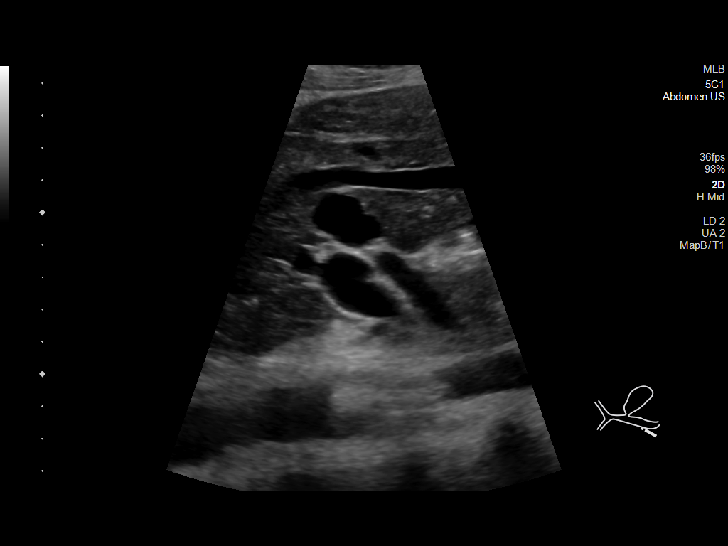
[im 17/100]
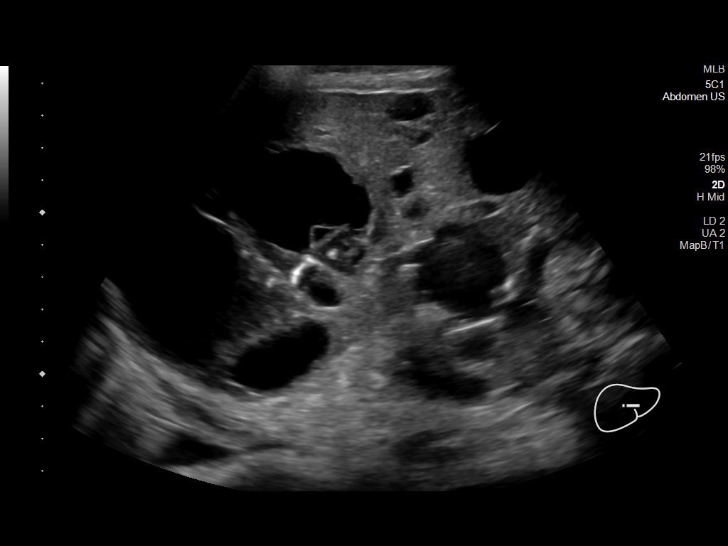
[im 25/100]
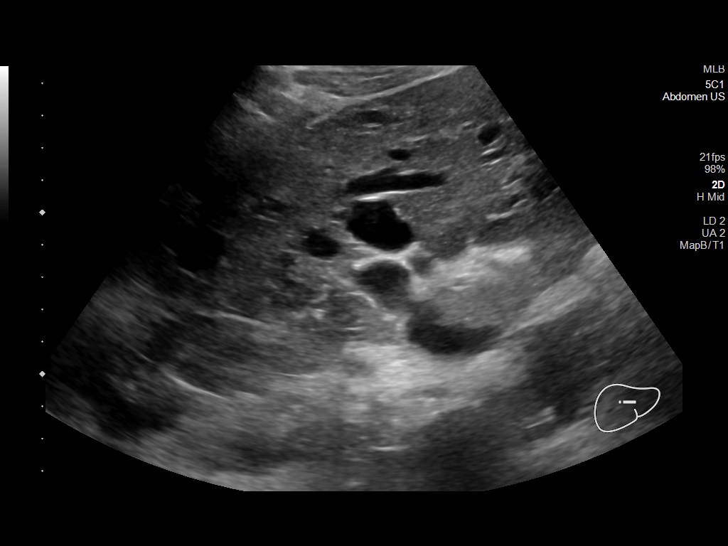
[im 34/100]
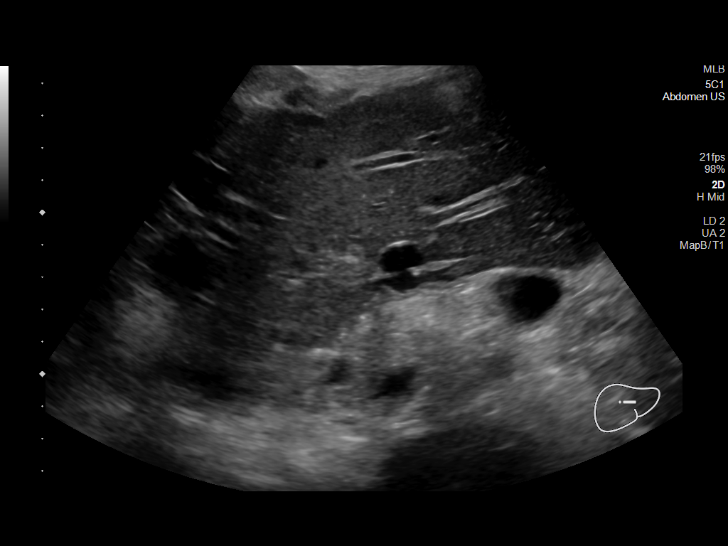
[im 42/100]
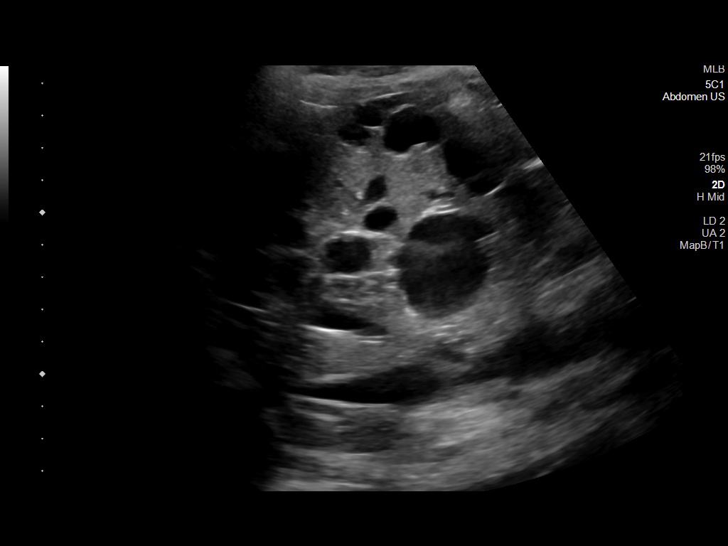
[im 50/100]
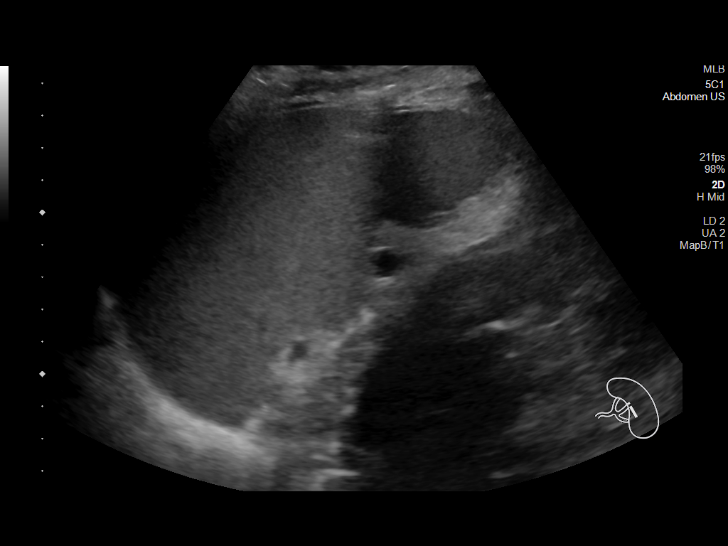
[im 58/100]
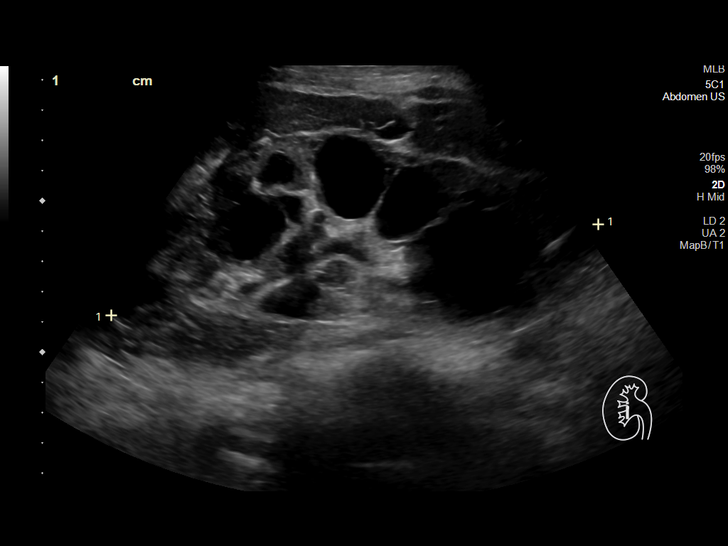
[im 67/100]
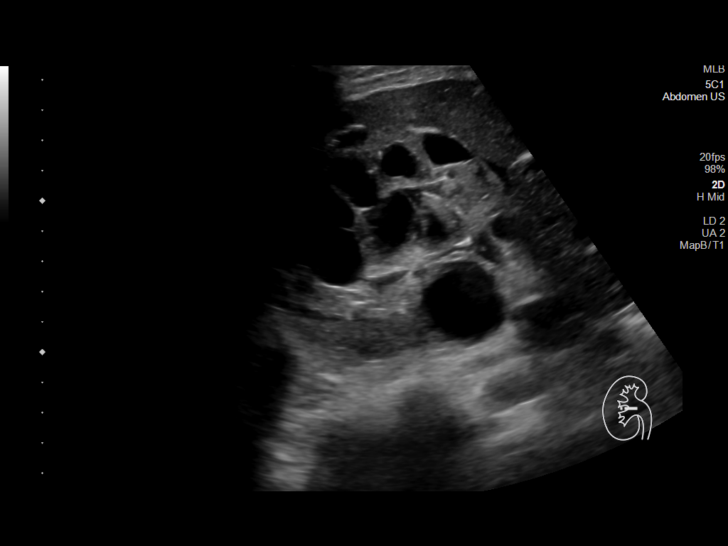
[im 75/100]
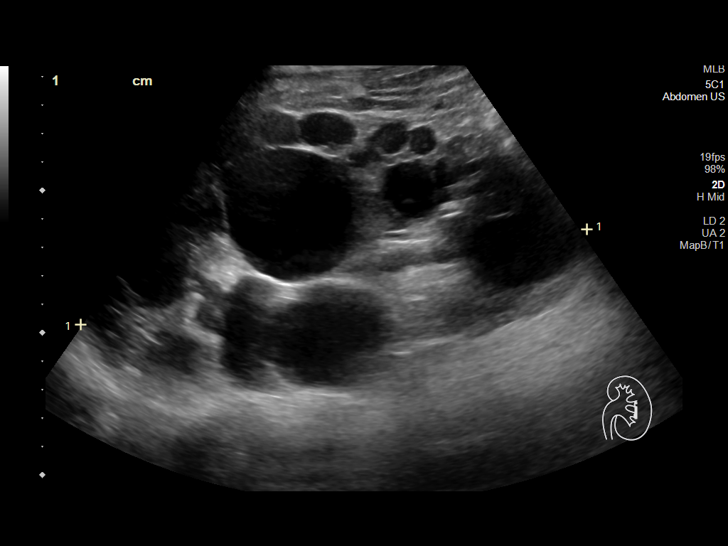
[im 83/100]
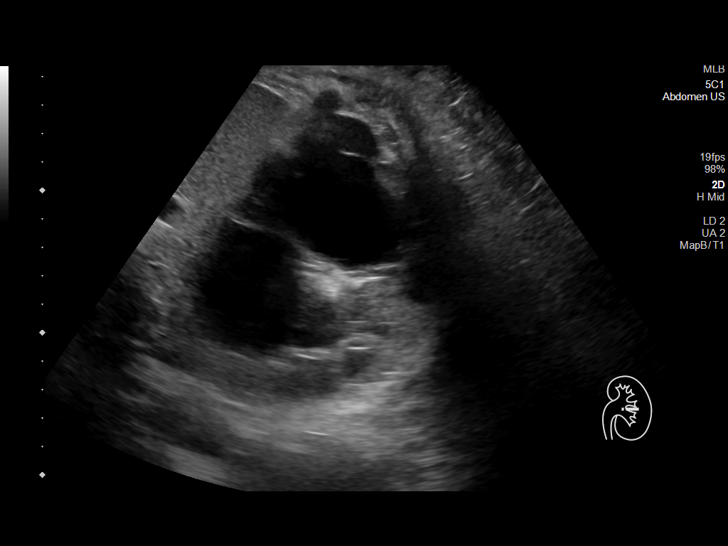
[im 91/100]
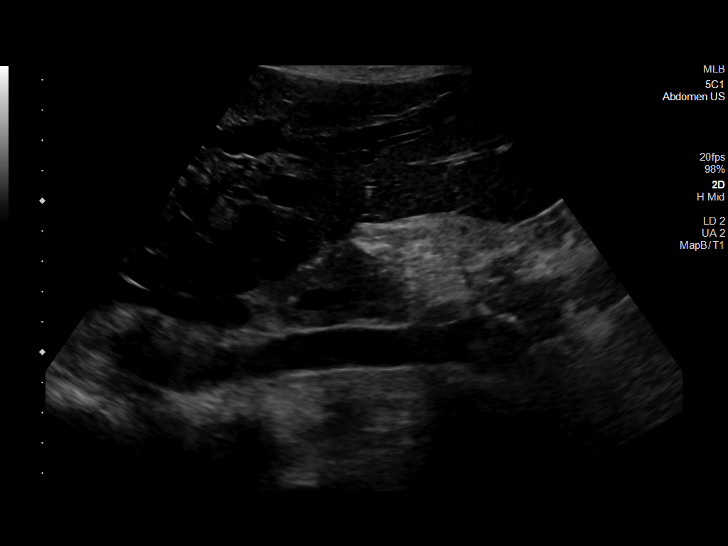
[im 100/100]
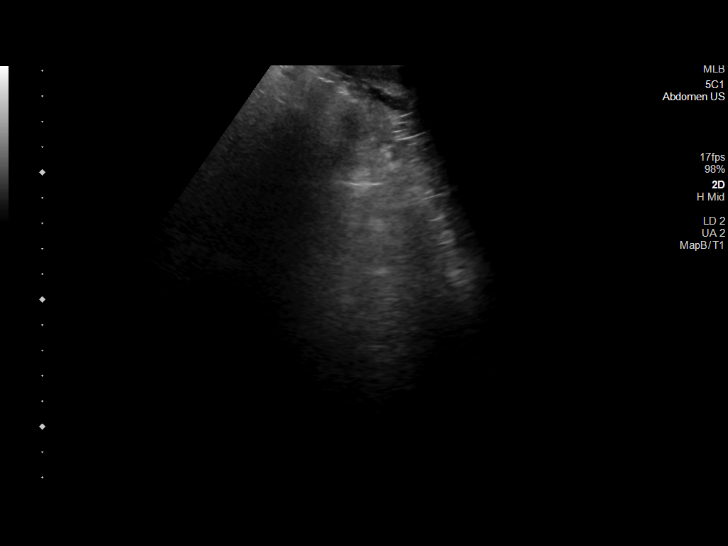

[13 of 25 positions shown; findings below may reference images not displayed]

FINDINGS: Gallbladder: No gallstones or wall thickening visualized. No
sonographic Murphy sign noted by sonographer.

Common bile duct: Diameter: 8 mm.

Liver: Numerous liver cysts. Within normal limits in parenchymal
echogenicity. Portal vein is patent on color Doppler imaging with
normal direction of blood flow towards the liver.

IVC: No abnormality visualized.

Pancreas: Visualized portion unremarkable.

Spleen: Size and appearance within normal limits.

Right Kidney: Length: 16.4 cm. The kidney is grossly enlarged by
numerous cysts. Echogenicity within normal limits. No mass or
hydronephrosis visualized.

Left Kidney: Length: 18.1 cm. The kidney is grossly enlarged by
numerous cysts. Echogenicity within normal limits. No mass or
hydronephrosis visualized.

Abdominal aorta: No aneurysm visualized.

Other findings: Small right pleural effusion.
IMPRESSION: 1. No definite acute abnormality.
2. Common bile duct measures 8 mm, mildly dilated. Patency may be
further evaluated by HIDA or MRCP if indicated by clinical evidence
of cholestasis.
3. Polycystic kidney and liver disease.
4. Small right pleural effusion.

## 2021-04-08 MED ORDER — POTASSIUM CHLORIDE 20 MEQ PO PACK
40.0000 meq | PACK | Freq: Once | ORAL | Status: AC
  Administered 2021-04-09: 40 meq via ORAL
  Filled 2021-04-08: qty 2

## 2021-04-08 MED ORDER — LACTATED RINGERS IV BOLUS: 1000.0000 mL | Freq: Once | INTRAVENOUS | Status: DC

## 2021-04-08 MED ORDER — IOHEXOL 350 MG/ML SOLN
60.0000 mL | Freq: Once | INTRAVENOUS | Status: AC | PRN
  Administered 2021-04-08: 60 mL via INTRAVENOUS

## 2021-04-08 MED ORDER — CHLORTHALIDONE 25 MG PO TABS
25.0000 mg | ORAL_TABLET | Freq: Every day | ORAL | Status: DC
  Administered 2021-04-09: 25 mg via ORAL
  Filled 2021-04-08: qty 1

## 2021-04-08 MED ORDER — ONDANSETRON HCL 4 MG/2ML IJ SOLN
4.0000 mg | Freq: Once | INTRAMUSCULAR | Status: AC
  Administered 2021-04-08: 4 mg via INTRAVENOUS
  Filled 2021-04-08: qty 2

## 2021-04-08 MED ORDER — ONDANSETRON HCL 4 MG/2ML IJ SOLN: 4.0000 mg | Freq: Four times a day (QID) | INTRAMUSCULAR | Status: DC | PRN

## 2021-04-08 MED ORDER — MORPHINE SULFATE (PF) 2 MG/ML IV SOLN: 2.0000 mg | INTRAVENOUS | Status: DC | PRN

## 2021-04-08 MED ORDER — PIPERACILLIN-TAZOBACTAM 3.375 G IVPB
3.3750 g | Freq: Three times a day (TID) | INTRAVENOUS | Status: DC
  Administered 2021-04-09 – 2021-04-14 (×17): 3.375 g via INTRAVENOUS
  Filled 2021-04-08 (×19): qty 50

## 2021-04-08 MED ORDER — FENTANYL CITRATE PF 50 MCG/ML IJ SOSY
50.0000 ug | PREFILLED_SYRINGE | Freq: Once | INTRAMUSCULAR | Status: AC
  Administered 2021-04-08: 50 ug via INTRAVENOUS
  Filled 2021-04-08: qty 1

## 2021-04-08 MED ORDER — ZOLPIDEM TARTRATE 5 MG PO TABS
5.0000 mg | ORAL_TABLET | Freq: Every evening | ORAL | Status: DC | PRN
  Administered 2021-04-16: 5 mg via ORAL
  Filled 2021-04-08: qty 1

## 2021-04-08 MED ORDER — LACTATED RINGERS IV BOLUS
1000.0000 mL | Freq: Once | INTRAVENOUS | Status: AC
  Administered 2021-04-09: 1000 mL via INTRAVENOUS

## 2021-04-08 MED ORDER — POLYETHYLENE GLYCOL 3350 17 G PO PACK: 17.0000 g | PACK | Freq: Every day | ORAL | Status: DC | PRN

## 2021-04-08 MED ORDER — SODIUM CHLORIDE 0.9 % IV BOLUS
1000.0000 mL | Freq: Once | INTRAVENOUS | Status: AC
  Administered 2021-04-08: 1000 mL via INTRAVENOUS

## 2021-04-08 MED ORDER — KCL-LACTATED RINGERS 20 MEQ/L IV SOLN: INTRAVENOUS | Status: DC

## 2021-04-08 MED ORDER — MORPHINE SULFATE (PF) 4 MG/ML IV SOLN
4.0000 mg | Freq: Once | INTRAVENOUS | Status: AC
  Administered 2021-04-08: 4 mg via INTRAVENOUS
  Filled 2021-04-08: qty 1

## 2021-04-08 MED ORDER — ACETAMINOPHEN 650 MG RE SUPP: 650.0000 mg | Freq: Four times a day (QID) | RECTAL | Status: DC | PRN

## 2021-04-08 MED ORDER — ENOXAPARIN SODIUM 40 MG/0.4ML IJ SOSY
40.0000 mg | PREFILLED_SYRINGE | INTRAMUSCULAR | Status: DC
  Administered 2021-04-09 – 2021-04-17 (×7): 40 mg via SUBCUTANEOUS
  Filled 2021-04-08 (×9): qty 0.4

## 2021-04-08 MED ORDER — ONDANSETRON HCL 4 MG PO TABS: 4.0000 mg | ORAL_TABLET | Freq: Four times a day (QID) | ORAL | Status: DC | PRN

## 2021-04-08 MED ORDER — POTASSIUM CHLORIDE 2 MEQ/ML IV SOLN
INTRAVENOUS | Status: DC
  Filled 2021-04-08 (×3): qty 1000

## 2021-04-08 MED ORDER — SODIUM CHLORIDE 0.9 % IV SOLN
1.0000 g | Freq: Once | INTRAVENOUS | Status: AC
  Administered 2021-04-08: 1 g via INTRAVENOUS
  Filled 2021-04-08: qty 10

## 2021-04-08 MED ORDER — ACETAMINOPHEN 325 MG PO TABS
650.0000 mg | ORAL_TABLET | Freq: Four times a day (QID) | ORAL | Status: DC | PRN
  Administered 2021-04-09 – 2021-04-12 (×4): 650 mg via ORAL
  Filled 2021-04-08 (×4): qty 2

## 2021-04-08 MED ORDER — POTASSIUM CHLORIDE 2 MEQ/ML IV SOLN
INTRAVENOUS | Status: DC
  Filled 2021-04-08: qty 1000

## 2021-04-08 MED ORDER — MORPHINE SULFATE (PF) 4 MG/ML IV SOLN
4.0000 mg | INTRAVENOUS | Status: DC | PRN
  Administered 2021-04-08 – 2021-04-09 (×5): 4 mg via INTRAVENOUS
  Filled 2021-04-08 (×5): qty 1

## 2021-04-08 MED ORDER — GABAPENTIN 300 MG PO CAPS
300.0000 mg | ORAL_CAPSULE | Freq: Two times a day (BID) | ORAL | Status: DC
  Administered 2021-04-09 – 2021-04-17 (×18): 300 mg via ORAL
  Filled 2021-04-08 (×18): qty 1

## 2021-04-08 NOTE — ED Provider Notes (Signed)
Patient is a 55 year old female who presents with right flank and upper abdominal pain.  She was initially seen by Dr. Pearline Cables.  This is been going on for 10 to 11 days.  She has had some associated nausea vomiting and intermittent fevers.  No urinary symptoms.  Her white count is elevated.  Her LFTs are essentially normal other than slightly elevated AST.  She had a CT scan 3 days ago which showed no acute abnormality.  Her urine does not appear to be infected.  She had a ultrasound of her right upper quadrant today which showed a slightly dilated common bile duct but no stones or other abnormality.  I spoke with Dr. Lyndel Safe with gastroenterology who also reviewed the images.  There is no clinical findings consistent with cholangitis.  No clear etiology for infection.  However she is febrile and tachycardic here.  I feel that admission would be appropriate.  I spoke with Dr. Cathlean Sauer with the hospitalist service who will admit the patient for further treatment.  He requests 1 dose of IV Rocephin prior to transfer.   Malvin Johns, MD 04/08/21 1745

## 2021-04-08 NOTE — ED Notes (Signed)
[facility] at bedside 

## 2021-04-08 NOTE — ED Notes (Signed)
ED Provider at bedside. 

## 2021-04-08 NOTE — ED Provider Notes (Signed)
[facility] DEPT Provider Note   CSN: [id number] Arrival date & time: 04/08/21  1046     History Chief Complaint  Patient presents with   Fever   Pain    Judy Manning is a 55 y.o. female.  Patient is a 55 yo female with pmh of polycystic kidney disease presenting for abdominal pain and fever. Patient admits to ruq abdominal pain with radiation to right flank, associated with fevers with T max of 104 F, with n/v/d x 11 days. Chart review demonstrates pt was seen in ED 3 days ago for similar symptoms and had negative laboratory workup and negative CT abdomen. Patient admits to continued pain and fevers at this time. States she has been taking tylenol as needed for fevers above 103 F.   The history is provided by the patient. No language interpreter was used.  Fever Max temp prior to arrival:  104 Temp source:  Subjective Severity:  Moderate Onset quality:  Gradual Duration:  11 days Timing:  Intermittent Associated symptoms: diarrhea, nausea and vomiting   Associated symptoms: no chest pain, no chills, no cough, no dysuria, no ear pain, no rash and no sore throat       Past Medical History:  Diagnosis Date   COVID 12/2020   Polycystic kidney disease     Patient Active Problem List   Diagnosis Date Noted   Other hyperlipidemia 06/17/2020   History of vitamin D deficiency 06/17/2020   Rheumatoid factor positive 06/17/2020   Endometrial cells on cervical Pap smear inconsistent w/LMP 09/18/2018   Fibroid 09/18/2018   Menorrhagia with irregular cycle 09/18/2018   History of basal cell carcinoma (BCC) 03/23/2018   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     Family History  Problem Relation Age of Onset   Rheum arthritis Mother    Heart disease Mother        Pacemaker   COPD Mother        Smoker   Skin cancer Mother    Cancer Mother    Stroke Father    Polycystic kidney disease Father    Arthritis Brother    Alzheimer's disease Maternal Grandmother 80   Diabetes Paternal Grandfather    Skin cancer Paternal Grandfather    Cancer Paternal Grandfather    Breast cancer Neg Hx    Colon cancer Neg Hx     Social History   Tobacco Use   Smoking status: Never   Smokeless tobacco: Never  Substance Use Topics   Alcohol use: Not Currently    Comment: rarely ever   Drug use: Never    Home Medications Prior to Admission medications   Medication Sig Start Date End Date Taking? Authorizing Provider  Biotin 10000 MCG TABS Take by mouth.   Yes [provider]  cetirizine (ZYRTEC) 10 MG tablet Take 10 mg by mouth daily as needed for allergies (alternates with claritin).   Yes [provider]  chlorthalidone (HYGROTON) 25 MG tablet TAKE 1 TABLET BY MOUTH  DAILY 03/27/20  Yes Hilts, Michael, MD  Cholecalciferol (VITAMIN D-3) 5000 units TABS Take by mouth.   Yes  [provider]  Dapsone 5 % topical gel Apply 1 application topically 2 (two) times daily. 09/18/20  Yes Hilts, Legrand Como, MD  gabapentin (NEURONTIN) 300 MG capsule TAKE 1 CAPSULE BY MOUTH  TWICE DAILY 12/29/20  Yes Hilts, Michael, MD  JYNARQUE 90 & 30 MG TBPK  03/14/18  Yes [provider]  Multiple Vitamin (MULTIVITAMIN) tablet Take 1 tablet by mouth daily.   Yes [provider]  ondansetron (ZOFRAN ODT) 4 MG disintegrating tablet Take 1 tablet (4 mg total) by mouth every 8 (eight) hours as needed for nausea or vomiting. 04/05/21  Yes Rayna Sexton, PA-C  zolpidem (AMBIEN) 10 MG tablet TAKE 1/2 TO 1 TABLET BY  MOUTH AT BEDTIME AS NEEDED  FOR SLEEP 03/23/21  Yes Hilts, Michael, MD  tretinoin  (RETIN-A) 0.025 % cream Apply topically at bedtime. 03/23/21   Hilts, Legrand Como, MD    Allergies    Imitrex [sumatriptan], Ibuprofen, Nitrofurantoin, and Oseltamivir  Review of Systems   Review of Systems  Constitutional:  Positive for fever. Negative for chills.  HENT:  Negative for ear pain and sore throat.   Eyes:  Negative for pain and visual disturbance.  Respiratory:  Negative for cough and shortness of breath.   Cardiovascular:  Negative for chest pain and palpitations.  Gastrointestinal:  Positive for abdominal pain, diarrhea, nausea and vomiting.  Genitourinary:  Negative for dysuria and hematuria.  Musculoskeletal:  Negative for arthralgias and back pain.  Skin:  Negative for color change and rash.  Neurological:  Negative for seizures and syncope.  All other systems reviewed and are negative.  Physical Exam Updated Vital Signs BP 123/61   Pulse 92   Temp 98.6 F (37 C) (Oral)   Resp (!) 32   Ht 5' (1.524 m)   Wt 53.5 kg   LMP 03/16/2021 Comment: period has lasted for 2 weeks  SpO2 92%   BMI 23.05 kg/m   Physical Exam Vitals and nursing note reviewed.  Constitutional:      General: She is not in acute distress.    Appearance: She is well-developed.  HENT:     Head: Normocephalic and atraumatic.  Eyes:     Conjunctiva/sclera: Conjunctivae normal.  Cardiovascular:     Rate and Rhythm: Normal rate and regular rhythm.     Heart sounds: No murmur heard. Pulmonary:     Effort: Pulmonary effort is normal. No respiratory distress.     Breath sounds: Normal breath sounds.  Abdominal:     Palpations: Abdomen is soft.     Tenderness: There is abdominal tenderness in the right upper quadrant. There is guarding. There is no rebound.  Musculoskeletal:     Cervical back: Neck supple.  Skin:    General: Skin is warm and dry.  Neurological:     Mental Status: She is alert.    ED Results / Procedures / Treatments   Labs (all labs ordered are listed, but only  abnormal results are displayed) Labs Reviewed  COMPREHENSIVE METABOLIC PANEL - Abnormal; Notable for the following components:      Result Value   Sodium 134 (*)    Potassium 3.1 (*)    Chloride 94 (*)    Glucose, Bld 102 (*)    Creatinine, Ser 1.28 (*)    AST 45 (*)    GFR, Estimated 50 (*)    All other components within normal limits  CBC WITH DIFFERENTIAL/PLATELET - Abnormal; Notable for the following components:   WBC 13.6 (*)  Hemoglobin 10.6 (*)    HCT 32.7 (*)    Neutro Abs 11.7 (*)    Abs Immature Granulocytes 0.13 (*)    All other components within normal limits  I-STAT VENOUS BLOOD GAS, ED - Abnormal; Notable for the following components:   pO2, Ven 21.0 (*)    Bicarbonate 31.8 (*)    TCO2 33 (*)    Acid-Base Excess 6.0 (*)    Sodium 134 (*)    Potassium 3.1 (*)    HCT 33.0 (*)    Hemoglobin 11.2 (*)    All other components within normal limits  CULTURE, BLOOD (ROUTINE X 2)  CULTURE, BLOOD (ROUTINE X 2)  LIPASE, BLOOD  LACTIC ACID, PLASMA  LACTIC ACID, PLASMA  URINALYSIS, ROUTINE W REFLEX MICROSCOPIC    EKG None  Radiology US Abdomen Complete  Result Date: 04/08/2021 CLINICAL DATA:  Right upper quadrant pain for 3 days, history of polycystic kidney disease EXAM: ABDOMEN ULTRASOUND COMPLETE COMPARISON:  CT abdomen pelvis, 04/05/2021 FINDINGS: Gallbladder: No gallstones or wall thickening visualized. No sonographic Murphy sign noted by sonographer. Common bile duct: Diameter: 8 mm. Liver: Numerous liver cysts. Within normal limits in parenchymal echogenicity. Portal vein is patent on color Doppler imaging with normal direction of blood flow towards the liver. IVC: No abnormality visualized. Pancreas: Visualized portion unremarkable. Spleen: Size and appearance within normal limits. Right Kidney: Length: 16.4 cm. The kidney is grossly enlarged by numerous cysts. Echogenicity within normal limits. No mass or hydronephrosis visualized. Left Kidney: Length: 18.1 cm.  The kidney is grossly enlarged by numerous cysts. Echogenicity within normal limits. No mass or hydronephrosis visualized. Abdominal aorta: No aneurysm visualized. Other findings: Small right pleural effusion. IMPRESSION: 1. No definite acute abnormality. 2. Common bile duct measures 8 mm, mildly dilated. Patency may be further evaluated by HIDA or MRCP if indicated by clinical evidence of cholestasis. 3. Polycystic kidney and liver disease. 4. Small right pleural effusion. Electronically Signed   By: Eddie Candle M.D.   On: 04/08/2021 13:57   DG Chest Portable 1 View  Result Date: 04/08/2021 CLINICAL DATA:  Shortness of breath and fever. EXAM: PORTABLE CHEST 1 VIEW COMPARISON:  04/05/2021 FINDINGS: The cardiac silhouette, mediastinal and hilar contours are within normal limits. A small left effusion is now noted. No infiltrates or edema. No pneumothorax. The bony thorax is intact. IMPRESSION: Small left pleural effusion. Electronically Signed   By: Marijo Sanes M.D.   On: 04/08/2021 12:44    Procedures Procedures   Medications Ordered in ED Medications  sodium chloride 0.9 % bolus 1,000 mL (0 mLs Intravenous Stopped 04/08/21 1333)  ondansetron (ZOFRAN) injection 4 mg (4 mg Intravenous Given 04/08/21 1221)    ED Course  I have reviewed the triage vital signs and the nursing notes.  Pertinent labs & imaging results that were available during my care of the patient were reviewed by me and considered in my medical decision making (see chart for details).    MDM Rules/Calculators/A&P   2:53 PM  55 yo female with pmh of polycystic kidney disease presenting for abdominal pain and fever. Pt is Aox3, acute distress due to pain, afebrile, with tachycardia and otherwise stable vitals. Physical exam demonstrates tenderness to RUQ with guarding. Stable liver profile, lipase, and renal function at baseline. RUQ US demonstrates: "1. No definite acute abnormality. 2. Common bile duct measures 8 mm, mildly  dilated. Patency may be further evaluated by HIDA or MRCP if indicated by clinical evidence of cholestasis. 3.  Polycystic kidney and liver disease. 4. Small right pleural effusion."   CT abdomen from 3 days ago on 04/05/2021 demonstrates: "Hepatobiliary: Innumerable hepatic cysts. Gallbladder unremarkable. Common bile duct mildly dilated at 9 mm."  Patient signed out to oncoming physician while awaiting call back from GI.          Final Clinical Impression(s) / ED Diagnoses Final diagnoses:  Right upper quadrant abdominal pain    Rx / DC Orders ED Discharge Orders     None        Lianne Cure, DO [id number]

## 2021-04-08 NOTE — Progress Notes (Signed)
Pharmacy Antibiotic Note  Judy Manning is a 55 y.o. female admitted on 04/08/2021 with right flank and upper abd pain.  Pharmacy has been consulted to dose zosyn for intra-abdominal infection.  Plan: Zosyn 3.375g IV q8h (4 hour infusion). Pharmacy will sign off and follow peripherally  Height: 5' (152.4 cm) Weight: 53.5 kg (118 lb) IBW/kg (Calculated) : 45.5  Temp (24hrs), Avg:99.6 F (37.6 C), Min:98.6 F (37 C), Max:100.5 F (38.1 C)  Recent Labs  Lab 04/05/21 1840 04/05/21 1906 04/08/21 1211  WBC  --  10.5 13.6*  CREATININE  --  1.30* 1.28*  LATICACIDVEN 0.9  --  0.8    Estimated Creatinine Clearance: 36.9 mL/min (A) (by C-G formula based on SCr of 1.28 mg/dL (H)).    Allergies  Allergen Reactions   Imitrex [Sumatriptan] Swelling    Throat gets tight   Ibuprofen Other (See Comments)    Polycystic kidney disease   Nitrofurantoin Rash   Oseltamivir Nausea And Vomiting    Thank you for allowing pharmacy to be a part of this patient's care.  Dolly Rias RPh 04/08/2021, 11:10 PM

## 2021-04-08 NOTE — ED Notes (Signed)
Report called to Edwin Cap, 6E at [facility].

## 2021-04-08 NOTE — ED Notes (Signed)
Attempted to call report to Wille Glaser, RN, 6E at [facility].  Provided number for a return call.

## 2021-04-08 NOTE — ED Triage Notes (Signed)
Pt returns due to pain in upper rt ribs radiating to back, has been seen several times for same, has kidney issues, states she has fevers up to 104 at night, continues to have N/V/D, was told to come back for possible admission if not better.

## 2021-04-08 NOTE — H&P (Signed)
History and Physical    Judy Manning [id number] DOB: 1965/08/11 DOA: 04/08/2021  PCP: Eunice Blase, MD  Patient coming from: Home via [place] ED   Chief Complaint:  Chief Complaint  Patient presents with   Fever   Pain     HPI:    55 year old female with past medical history of polycystic kidney disease, hypertension, migraine headaches who presents to med center [facility] emergency department with complaints of right chest and flank pain.  Patient began to experience fevers fatigue and right chest/flank pain approximately 2 weeks ago.  Symptoms were initially mild intensity but rapidly became severe.  Patient describes her pain as originating in the right upper quadrant, radiating into the right chest and into the right shoulder blade.  Pain can reach 10 out of 10 in intensity, a sharp in quality and seems to be improved when the patient lies on her left side.  Pain additionally gets worse when patient takes deep inspirations.  Alongside this severe pain patient has been experiencing intermittent fevers generalized weakness and poor appetite.  Patient additionally complains of nausea with occasional bouts of vomiting and loose stools.  Upon further questioning, patient denies sick contacts, recent travel or contact with confirmed COVID-19 infection.  Patient initially presented to urgent care clinic on 8/30 for evaluation.  After an evaluation there patient was felt to be suffering from a viral syndrome and was sent home for supportive care.  Patient continued to experience symptoms for several days and eventually went to a [facility] health emergency department on 9/5 for evaluation.  During that visit patient underwent CT imaging of the abdomen and pelvis which was essentially unremarkable with exception of chronic findings of polycystic kidney disease.  Due to patient's negative work-up she was eventually discharged home.  Concurrently, the patient had seen her primary  care provider who provided her a course of azithromycin which she completed with no improvement in symptoms.  Due to progressively worsening right flank, right upper quadrant, right chest pain, shortness of breath, fevers, weakness, nausea, vomiting patient eventually presented to med center [place] emergency department for evaluation.  Upon valuation in the emergency department a right upper quadrant ultrasound did reveal a mildly dilated common bile duct of 8 mm.  Patient was noted to have a leukocytosis of 13.6 with multiple SIRS criteria including fever of 100.5 F.  Emergency department provider initially discussed this with the gastroenterology.  Considering normal LFTs gastroenterology recommended medicine to admit and continue to work patient up and consult gastroenterology on day shift if necessary.  1 L of isotonic fluids was provided.  Chest x-ray performed revealed a small right-sided pleural effusion.  Review of Systems:   ROS  Past Medical History:  Diagnosis Date   COVID 12/2020   Polycystic kidney disease     Past Surgical History:  Procedure Laterality Date   AUGMENTATION MAMMAPLASTY       reports that she has never smoked. She has never used smokeless tobacco. She reports that she does not currently use alcohol. She reports that she does not use drugs.  Allergies  Allergen Reactions   Imitrex [Sumatriptan] Swelling    Throat gets tight   Ibuprofen Other (See Comments)    Polycystic kidney disease   Nitrofurantoin Rash   Oseltamivir Nausea And Vomiting    Family History  Problem Relation Age of Onset   Rheum arthritis Mother    Heart disease Mother        Pacemaker   COPD  Mother        Smoker   Skin cancer Mother    Cancer Mother    Stroke Father    Polycystic kidney disease Father    Arthritis Brother    Alzheimer's disease Maternal Grandmother 3   Diabetes Paternal Grandfather    Skin cancer Paternal Grandfather    Cancer Paternal Grandfather     Breast cancer Neg Hx    Colon cancer Neg Hx      Prior to Admission medications   Medication Sig Start Date End Date Taking? Authorizing Provider  Biotin 10000 MCG TABS Take by mouth.   Yes [provider]  cetirizine (ZYRTEC) 10 MG tablet Take 10 mg by mouth daily as needed for allergies (alternates with claritin).   Yes [provider]  chlorthalidone (HYGROTON) 25 MG tablet TAKE 1 TABLET BY MOUTH  DAILY 03/27/20  Yes Hilts, Michael, MD  Cholecalciferol (VITAMIN D-3) 5000 units TABS Take by mouth.   Yes [provider]  Dapsone 5 % topical gel Apply 1 application topically 2 (two) times daily. 09/18/20  Yes Hilts, Legrand Como, MD  gabapentin (NEURONTIN) 300 MG capsule TAKE 1 CAPSULE BY MOUTH  TWICE DAILY 12/29/20  Yes Hilts, Michael, MD  JYNARQUE 90 & 30 MG TBPK  03/14/18  Yes [provider]  Multiple Vitamin (MULTIVITAMIN) tablet Take 1 tablet by mouth daily.   Yes [provider]  ondansetron (ZOFRAN ODT) 4 MG disintegrating tablet Take 1 tablet (4 mg total) by mouth every 8 (eight) hours as needed for nausea or vomiting. 04/05/21  Yes Rayna Sexton, PA-C  zolpidem (AMBIEN) 10 MG tablet TAKE 1/2 TO 1 TABLET BY  MOUTH AT BEDTIME AS NEEDED  FOR SLEEP 03/23/21  Yes Hilts, Michael, MD  tretinoin (RETIN-A) 0.025 % cream Apply topically at bedtime. 03/23/21   Hilts, Legrand Como, MD    Physical Exam: Vitals:   04/08/21 1600 04/08/21 1740 04/08/21 1948 04/08/21 2216  BP: (!) 106/45 (!) 102/50 (!) 128/53 112/67  Pulse: 93 (!) 114 (!) 114 (!) 104  Resp: (!) 24 (!) 36 (!) 24 20  Temp: (!) 100.5 F (38.1 C)  99.6 F (37.6 C) 99.5 F (37.5 C)  TempSrc: Oral  Oral Oral  SpO2: 96% 98% 95% 91%  Weight:      Height:        Constitutional: Awake alert and oriented x3, patient is in distress due to pain. Skin: no rashes, no lesions, somewhat poor skin turgor noted. Eyes: Pupils are equally reactive to light.  No evidence of scleral icterus or conjunctival  pallor.  ENMT: Dry mucous membranes noted.  Posterior pharynx clear of any exudate or lesions.   Neck: normal, supple, no masses, no thyromegaly.  No evidence of jugular venous distension.   Respiratory: clear to auscultation bilaterally, no wheezing, no crackles. Normal respiratory effort. No accessory muscle use.  Cardiovascular: Tachycardic rate with regular rhythm, no murmurs / rubs / gallops. No extremity edema. 2+ pedal pulses. No carotid bruits.  Chest:   Nontender without crepitus or deformity.   Back:   Significant right flank tenderness on palpation without crepitus or deformity. Abdomen: Severe right lower quadrant tenderness abdomen is soft however.  No obvious intra-abdominal masses.  Positive bowel sounds noted in all quadrants.   Musculoskeletal: No joint deformity upper and lower extremities. Good ROM, no contractures. Normal muscle tone.  Neurologic: CN 2-12 grossly intact. Sensation intact.  Patient moving all 4 extremities spontaneously.  Patient is following all commands.  Patient is responsive to verbal stimuli.   Psychiatric: Patient exhibits normal mood with appropriate affect.  Patient seems to possess insight as to their current situation.     Labs on Admission: I have personally reviewed following labs and imaging studies -   CBC: Recent Labs  Lab 04/05/21 1906 04/08/21 1211 04/08/21 1214  WBC 10.5 13.6*  --   NEUTROABS 8.7* 11.7*  --   HGB 11.3* 10.6* 11.2*  HCT 33.9* 32.7* 33.0*  MCV 80.3 81.8  --   PLT 295 390  --    Basic Metabolic Panel: Recent Labs  Lab 04/05/21 1906 04/08/21 1211 04/08/21 1214  NA 134* 134* 134*  K 3.1* 3.1* 3.1*  CL 93* 94*  --   CO2 27 28  --   GLUCOSE 101* 102*  --   BUN 22* 13  --   CREATININE 1.30* 1.28*  --   CALCIUM 9.2 9.0  --    GFR: Estimated Creatinine Clearance: 36.9 mL/min (A) (by C-G formula based on SCr of 1.28 mg/dL (H)). Liver Function Tests: Recent Labs  Lab 04/05/21 1906 04/08/21 1211  AST 18 45*   ALT 14 32  ALKPHOS 105 106  BILITOT 0.7 0.8  PROT 7.3 7.6  ALBUMIN 3.6 3.5   Recent Labs  Lab 04/05/21 1906 04/08/21 1211  LIPASE 18 15   No results for input(s): AMMONIA in the last 168 hours. Coagulation Profile: No results for input(s): INR, PROTIME in the last 168 hours. Cardiac Enzymes: No results for input(s): CKTOTAL, CKMB, CKMBINDEX, TROPONINI in the last 168 hours. BNP (last 3 results) No results for input(s): PROBNP in the last 8760 hours. HbA1C: No results for input(s): HGBA1C in the last 72 hours. CBG: No results for input(s): GLUCAP in the last 168 hours. Lipid Profile: No results for input(s): CHOL, HDL, LDLCALC, TRIG, CHOLHDL, LDLDIRECT in the last 72 hours. Thyroid Function Tests: No results for input(s): TSH, T4TOTAL, FREET4, T3FREE, THYROIDAB in the last 72 hours. Anemia Panel: No results for input(s): VITAMINB12, FOLATE, FERRITIN, TIBC, IRON, RETICCTPCT in the last 72 hours. Urine analysis:    Component Value Date/Time   COLORURINE YELLOW 04/08/2021 1135   APPEARANCEUR CLEAR 04/08/2021 1135   LABSPEC 1.009 04/08/2021 1135   PHURINE 6.0 04/08/2021 1135   GLUCOSEU NEGATIVE 04/08/2021 1135   HGBUR MODERATE (A) 04/08/2021 1135   BILIRUBINUR NEGATIVE 04/08/2021 1135   BILIRUBINUR small (A) 03/30/2021 1833   KETONESUR NEGATIVE 04/08/2021 1135   PROTEINUR TRACE (A) 04/08/2021 1135   UROBILINOGEN 1.0 03/30/2021 1833   UROBILINOGEN 0.2 08/31/2009 1605   NITRITE NEGATIVE 04/08/2021 1135   LEUKOCYTESUR SMALL (A) 04/08/2021 1135    Radiological Exams on Admission - Personally Reviewed: US Abdomen Complete  Result Date: 04/08/2021 CLINICAL DATA:  Right upper quadrant pain for 3 days, history of polycystic kidney disease EXAM: ABDOMEN ULTRASOUND COMPLETE COMPARISON:  CT abdomen pelvis, 04/05/2021 FINDINGS: Gallbladder: No gallstones or wall thickening visualized. No sonographic Murphy sign noted by sonographer. Common bile duct: Diameter: 8 mm. Liver: Numerous  liver cysts. Within normal limits in parenchymal echogenicity. Portal vein is patent on color Doppler imaging with normal direction of blood flow towards the liver. IVC: No abnormality visualized. Pancreas: Visualized portion unremarkable. Spleen: Size and appearance within normal limits. Right Kidney: Length: 16.4 cm. The kidney is grossly enlarged by numerous cysts. Echogenicity within normal limits. No mass or hydronephrosis visualized. Left Kidney: Length: 18.1 cm. The kidney is grossly enlarged by numerous cysts. Echogenicity within normal limits. No  mass or hydronephrosis visualized. Abdominal aorta: No aneurysm visualized. Other findings: Small right pleural effusion. IMPRESSION: 1. No definite acute abnormality. 2. Common bile duct measures 8 mm, mildly dilated. Patency may be further evaluated by HIDA or MRCP if indicated by clinical evidence of cholestasis. 3. Polycystic kidney and liver disease. 4. Small right pleural effusion. Electronically Signed   By: Eddie Candle M.D.   On: 04/08/2021 13:57   DG Chest Portable 1 View  Result Date: 04/08/2021 CLINICAL DATA:  Shortness of breath and fever. EXAM: PORTABLE CHEST 1 VIEW COMPARISON:  04/05/2021 FINDINGS: The cardiac silhouette, mediastinal and hilar contours are within normal limits. A small left effusion is now noted. No infiltrates or edema. No pneumothorax. The bony thorax is intact. IMPRESSION: Small left pleural effusion. Electronically Signed   By: Marijo Sanes M.D.   On: 04/08/2021 12:44    EKG: Personally reviewed.  Rhythm is sinus tachycardia with heart rate of 115 bpm.  No dynamic ST segment changes appreciated.  Assessment/Plan Principal Problem:   Right upper quadrant pain with common bile duct dilation  Patient presenting with rather dramatic presentation with severe right upper quadrant right chest and right scapular pain over the course of the past 2 weeks with multiple SIRS criteria Only abnormalities identified thus far are  mildly elevated common bile duct with a LFT derangement as well as a mild right-sided pleural effusion. To better evaluate, obtaining an MRCP as well as CT angiogram of the chest to evaluate for any evidence of common bile duct obstruction, infection or malignancy.  Additionally ruling out acute pulmonary embolism which could also present similarly (albeit less likely). Is already been initiated on intravenous antibiotics while in the emergency department.  Considering the concerns for possible biliary infection such as cholangitis or cholecystitis we will keep patient on scheduled intravenous Zosyn until work-up is complete.  This can likely be discontinued or adjusted pending the work-up. As needed opiate-based analgesics for associated substantial pain. Hydrating patient with intravenous isotonic fluid We will formally consult gastroenterology if MRCP identifies an etiology of the patient's presentation  Active Problems:   SIRS (systemic inflammatory response syndrome) (HCC)  Multiple SIRS criteria noted without organ dysfunction for definitive source of infection Performing work-up as noted above Patient is COVID-19 negative Urinalysis unremarkable Blood cultures, procalcitonin, CRP have been obtained and are pending. Empiric antibiotic therapy as noted above.    Hypokalemia, inadequate intake  Replacing with both oral and intravenous potassium chloride Monitoring potassium levels with serial chemistries    Polycystic kidney disease  Will resume home regimen of tolvaptan once regimen is confirmed with patient Otherwise, outpatient follow-up    History of migraine headaches  Continue home regimen of prophylactic gabapentin   Code Status:  Full code  code status decision has been confirmed with: Patient Family Communication: Spouse is at bedside who has been updated on plan of care  Status is: Inpatient  Remains inpatient appropriate because:Ongoing diagnostic testing needed  not appropriate for outpatient work up, IV treatments appropriate due to intensity of illness or inability to take PO, and Inpatient level of care appropriate due to severity of illness  Dispo: The patient is from: Home              Anticipated d/c is to: Home              Patient currently is not medically stable to d/c.   Difficult to place patient No        Iona Beard  Dossie Arbour MD [facility] Pager [phone]  If 7PM-7AM, please contact night-coverage [email or web address] Use universal [place] password for that web site. If you do not have the password, please call the hospital operator.  04/08/2021, 11:42 PM

## 2021-04-08 NOTE — Plan of Care (Signed)
  Problem: Education: Goal: Knowledge of General Education information will improve Description: Including pain rating scale, medication(s)/side effects and non-pharmacologic comfort measures Outcome: Progressing   Problem: Clinical Measurements: Goal: Will remain free from infection Outcome: Progressing   Problem: Nutrition: Goal: Adequate nutrition will be maintained Outcome: Progressing   Problem: Coping: Goal: Level of anxiety will decrease Outcome: Progressing   Problem: Elimination: Goal: Will not experience complications related to bowel motility Outcome: Progressing   Problem: Pain Managment: Goal: General experience of comfort will improve Outcome: Progressing

## 2021-04-08 NOTE — ED Notes (Signed)
Report called to Richardson Landry with [facility].

## 2021-04-09 DIAGNOSIS — E876 Hypokalemia: Secondary | ICD-10-CM

## 2021-04-09 DIAGNOSIS — R1011 Right upper quadrant pain: Secondary | ICD-10-CM

## 2021-04-09 DIAGNOSIS — K838 Other specified diseases of biliary tract: Secondary | ICD-10-CM

## 2021-04-09 LAB — COMPREHENSIVE METABOLIC PANEL
ALT: 34 U/L (ref 0–44)
AST: 44 U/L — ABNORMAL HIGH (ref 15–41)
Albumin: 2.2 g/dL — ABNORMAL LOW (ref 3.5–5.0)
Alkaline Phosphatase: 92 U/L (ref 38–126)
Anion gap: 12 (ref 5–15)
BUN: 15 mg/dL (ref 6–20)
CO2: 25 mmol/L (ref 22–32)
Calcium: 8.5 mg/dL — ABNORMAL LOW (ref 8.9–10.3)
Chloride: 102 mmol/L (ref 98–111)
Creatinine, Ser: 1.27 mg/dL — ABNORMAL HIGH (ref 0.44–1.00)
GFR, Estimated: 51 mL/min — ABNORMAL LOW (ref >60)
Glucose, Bld: 82 mg/dL (ref 70–99)
Potassium: 4.3 mmol/L (ref 3.5–5.1)
Sodium: 139 mmol/L (ref 135–145)
Total Bilirubin: 0.9 mg/dL (ref 0.3–1.2)
Total Protein: 6.2 g/dL — ABNORMAL LOW (ref 6.5–8.1)

## 2021-04-09 LAB — CBC WITH DIFFERENTIAL/PLATELET
Abs Immature Granulocytes: 0.13 10*3/uL — ABNORMAL HIGH (ref 0.00–0.07)
Basophils Absolute: 0 10*3/uL (ref 0.0–0.1)
Basophils Relative: 0 %
Eosinophils Absolute: 0 10*3/uL (ref 0.0–0.5)
Eosinophils Relative: 0 %
HCT: 31.1 % — ABNORMAL LOW (ref 36.0–46.0)
Hemoglobin: 10.1 g/dL — ABNORMAL LOW (ref 12.0–15.0)
Immature Granulocytes: 1 %
Lymphocytes Relative: 5 %
Lymphs Abs: 0.7 10*3/uL (ref 0.7–4.0)
MCH: 26.8 pg (ref 26.0–34.0)
MCHC: 32.5 g/dL (ref 30.0–36.0)
MCV: 82.5 fL (ref 80.0–100.0)
Monocytes Absolute: 0.8 10*3/uL (ref 0.1–1.0)
Monocytes Relative: 6 %
Neutro Abs: 11.9 10*3/uL — ABNORMAL HIGH (ref 1.7–7.7)
Neutrophils Relative %: 88 %
Platelets: 409 10*3/uL — ABNORMAL HIGH (ref 150–400)
RBC: 3.77 MIL/uL — ABNORMAL LOW (ref 3.87–5.11)
RDW: 13.8 % (ref 11.5–15.5)
WBC: 13.6 10*3/uL — ABNORMAL HIGH (ref 4.0–10.5)
nRBC: 0 % (ref 0.0–0.2)

## 2021-04-09 LAB — C-REACTIVE PROTEIN: CRP: 23.9 mg/dL — ABNORMAL HIGH (ref <1.0)

## 2021-04-09 LAB — PROTIME-INR
INR: 1.5 — ABNORMAL HIGH (ref 0.8–1.2)
Prothrombin Time: 17.7 seconds — ABNORMAL HIGH (ref 11.4–15.2)

## 2021-04-09 LAB — TROPONIN I (HIGH SENSITIVITY): Troponin I (High Sensitivity): 73 ng/L — ABNORMAL HIGH (ref <18)

## 2021-04-09 LAB — APTT: aPTT: 31 seconds (ref 24–36)

## 2021-04-09 LAB — HIV ANTIBODY (ROUTINE TESTING W REFLEX): HIV Screen 4th Generation wRfx: NONREACTIVE

## 2021-04-09 LAB — PROCALCITONIN
Procalcitonin: 4.47 ng/mL
Procalcitonin: 3.6 ng/mL

## 2021-04-09 LAB — MAGNESIUM: Magnesium: 1.4 mg/dL — ABNORMAL LOW (ref 1.7–2.4)

## 2021-04-09 LAB — MRSA NEXT GEN BY PCR, NASAL: MRSA by PCR Next Gen: NOT DETECTED

## 2021-04-09 LAB — CORTISOL-AM, BLOOD: Cortisol - AM: 19.1 ug/dL (ref 6.7–22.6)

## 2021-04-09 MED ORDER — MORPHINE SULFATE (PF) 4 MG/ML IV SOLN
4.0000 mg | INTRAVENOUS | Status: DC | PRN
  Administered 2021-04-09 – 2021-04-10 (×6): 4 mg via INTRAVENOUS
  Filled 2021-04-09 (×6): qty 1

## 2021-04-09 MED ORDER — MORPHINE SULFATE (PF) 2 MG/ML IV SOLN: 2.0000 mg | INTRAVENOUS | Status: DC | PRN

## 2021-04-09 MED ORDER — POTASSIUM CHLORIDE 2 MEQ/ML IV SOLN
INTRAVENOUS | Status: DC
  Filled 2021-04-09 (×17): qty 1000

## 2021-04-09 MED ORDER — KCL-LACTATED RINGERS 20 MEQ/L IV SOLN
INTRAVENOUS | Status: DC
  Filled 2021-04-09 (×2): qty 1000

## 2021-04-09 MED ORDER — OXYCODONE-ACETAMINOPHEN 5-325 MG PO TABS: 1.0000 | ORAL_TABLET | ORAL | Status: DC | PRN

## 2021-04-09 MED ORDER — MAGNESIUM SULFATE 4 GM/100ML IV SOLN
4.0000 g | Freq: Once | INTRAVENOUS | Status: AC
  Administered 2021-04-09: 4 g via INTRAVENOUS
  Filled 2021-04-09: qty 100

## 2021-04-09 MED ORDER — GADOBUTROL 1 MMOL/ML IV SOLN
5.0000 mL | Freq: Once | INTRAVENOUS | Status: AC | PRN
  Administered 2021-04-09: 5 mL via INTRAVENOUS

## 2021-04-09 NOTE — Consult Note (Addendum)
Referring Provider: [facility] Primary Care Physician:  Eunice Blase, MD Primary Gastroenterologist: Althia Forts  Reason for Consultation:  CBD dilation, suspected medication induced cholangitis  HPI: Judy Manning is a 55 y.o. female with prior history of polycystic kidney disease, hypertension, migraine headaches presents with abdominal pain, nausea, and vomiting.  Patient states 2 weeks ago she started with a fever (tmax 104F), chills, and abdominal pain. Mild intensity,which rapidly became severe. Patient describes her pain as originating in the right upper quadrant, radiating into the right chest and into the right shoulder blade.  Pain can reach 10 out of 10 in intensity, a sharp in quality and seems to be improved when the patient lies on her left side.  Pain additionally gets worse when patient takes deep inspirations. Patient endorses nausea, vomiting, and loose stools. States for the last week she hasn't even been able to keep water down.   Patient was seen by urgent care on 8/30. Patient was provided supportive care. When symptoms worsened overall several days she went to ED 9/5. CT imaging was unremarkable with exception of known polycystic kidney disease. She was discharged home. PCP prescribed azithromycin, she completed the course with no relief in symptoms. Chlorthalidone was discontinued about two weeks ago.  Patient denies melena, hematochezia, and family history of GI disorders.  No prior history of colonoscopy.  Past Medical History:  Diagnosis Date   COVID 12/2020   Polycystic kidney disease     Past Surgical History:  Procedure Laterality Date   AUGMENTATION MAMMAPLASTY      Prior to Admission medications   Medication Sig Start Date End Date Taking? Authorizing Provider  Biotin 10000 MCG TABS Take by mouth.   Yes [provider]  cetirizine (ZYRTEC) 10 MG tablet Take 10 mg by mouth daily as needed for allergies (alternates with claritin).   Yes [provider]  chlorthalidone (HYGROTON) 25 MG tablet TAKE 1 TABLET BY MOUTH  DAILY 03/27/20  Yes Hilts, Michael, MD  Cholecalciferol (VITAMIN D-3) 5000 units TABS Take by mouth.   Yes [provider]  Dapsone 5 % topical gel Apply 1 application topically 2 (two) times daily. 09/18/20  Yes Hilts, Legrand Como, MD  gabapentin (NEURONTIN) 300 MG capsule TAKE 1 CAPSULE BY MOUTH  TWICE DAILY 12/29/20  Yes Hilts, Michael, MD  JYNARQUE 90 & 30 MG TBPK  03/14/18  Yes [provider]  Multiple Vitamin (MULTIVITAMIN) tablet Take 1 tablet by mouth daily.   Yes [provider]  ondansetron (ZOFRAN ODT) 4 MG disintegrating tablet Take 1 tablet (4 mg total) by mouth every 8 (eight) hours as needed for nausea or vomiting. 04/05/21  Yes Rayna Sexton, PA-C  zolpidem (AMBIEN) 10 MG tablet TAKE 1/2 TO 1 TABLET BY  MOUTH AT BEDTIME AS NEEDED  FOR SLEEP 03/23/21  Yes Hilts, Michael, MD  tretinoin (RETIN-A) 0.025 % cream Apply topically at bedtime. 03/23/21   Hilts, Legrand Como, MD    Scheduled Meds:  enoxaparin (LOVENOX) injection  40 mg Subcutaneous Q24H   gabapentin  300 mg Oral BID   Continuous Infusions:  lactated ringers with KCl 20 mEq/L     magnesium sulfate bolus IVPB     piperacillin-tazobactam (ZOSYN)  IV 3.375 g (04/09/21 0957)   PRN Meds:.acetaminophen **OR** acetaminophen, morphine injection **OR** morphine injection, ondansetron **OR** ondansetron (ZOFRAN) IV, oxyCODONE-acetaminophen, polyethylene glycol, zolpidem  Allergies as of 04/08/2021 - Review Complete 04/08/2021  Allergen Reaction Noted   Imitrex [sumatriptan] Swelling 03/30/2021   Ibuprofen Other (See Comments) 03/30/2021  Nitrofurantoin Rash 01/01/2014   Oseltamivir Nausea And Vomiting 05/06/2013    Family History  Problem Relation Age of Onset   Rheum arthritis Mother    Heart disease Mother        Pacemaker   COPD Mother        Smoker   Skin cancer Mother    Cancer Mother    Stroke Father    Polycystic  kidney disease Father    Arthritis Brother    Alzheimer's disease Maternal Grandmother 36   Diabetes Paternal Grandfather    Skin cancer Paternal Grandfather    Cancer Paternal Grandfather    Breast cancer Neg Hx    Colon cancer Neg Hx     Social History   Socioeconomic History   Marital status: Married    Spouse name: Not on file   Number of children: Not on file   Years of education: Not on file   Highest education level: Not on file  Occupational History   Not on file  Tobacco Use   Smoking status: Never   Smokeless tobacco: Never  Substance and Sexual Activity   Alcohol use: Not Currently    Comment: rarely ever   Drug use: Never   Sexual activity: Not on file  Other Topics Concern   Not on file  Social History Narrative   Not on file   Social Determinants of Health   Financial Resource Strain: Not on file  Food Insecurity: Not on file  Transportation Needs: Not on file  Physical Activity: Not on file  Stress: Not on file  Social Connections: Not on file  Intimate Partner Violence: Not on file    Review of Systems: Review of Systems  Constitutional:  Positive for chills and fever.  HENT:  Negative for congestion and sore throat.   Eyes:  Negative for pain and redness.  Respiratory:  Negative for cough and shortness of breath.   Cardiovascular:  Negative for chest pain and palpitations.  Gastrointestinal:  Positive for abdominal pain, diarrhea, nausea and vomiting. Negative for blood in stool, constipation and heartburn.  Genitourinary:  Negative for flank pain and hematuria.  Musculoskeletal:  Negative for joint pain and myalgias.  Skin:  Negative for itching and rash.  Neurological:  Negative for seizures and loss of consciousness.  Psychiatric/Behavioral:  Negative for substance abuse. The patient is nervous/anxious.     Physical Exam:Physical Exam Constitutional:      General: She is not in acute distress.    Appearance: Normal appearance. She is not  ill-appearing.  HENT:     Head: Normocephalic and atraumatic.     Nose: Nose normal.     Mouth/Throat:     Mouth: Mucous membranes are moist.     Pharynx: Oropharynx is clear.  Eyes:     General: No scleral icterus.    Conjunctiva/sclera: Conjunctivae normal.  Cardiovascular:     Rate and Rhythm: Normal rate and regular rhythm.  Pulmonary:     Effort: Pulmonary effort is normal.  Abdominal:     General: Abdomen is flat. Bowel sounds are normal. There is no distension.     Palpations: Abdomen is soft. There is no mass.     Tenderness: There is abdominal tenderness (moderate epigastric/RUQ). There is guarding (voluntary). There is no rebound.  Musculoskeletal:        General: No swelling. Normal range of motion.     Cervical back: Normal range of motion and neck supple.  Skin:  General: Skin is warm and dry.     Coloration: Skin is not jaundiced or pale.  Neurological:     General: No focal deficit present.     Mental Status: She is alert and oriented to person, place, and time.  Psychiatric:        Mood and Affect: Mood normal.        Behavior: Behavior normal.    Vital signs: Vitals:   04/09/21 0639 04/09/21 1027  BP: (!) 112/56 (!) 123/51  Pulse: (!) 106 95  Resp: 20 20  Temp: 99.3 F (37.4 C) 99.3 F (37.4 C)  SpO2: (!) 85% 92%   Last BM Date: 04/08/21    GI:  Lab Results: Recent Labs    04/08/21 1211 04/08/21 1214 04/09/21 0532  WBC 13.6*  --  13.6*  HGB 10.6* 11.2* 10.1*  HCT 32.7* 33.0* 31.1*  PLT 390  --  409*   BMET Recent Labs    04/08/21 1211 04/08/21 1214 04/09/21 0532  NA 134* 134* 139  K 3.1* 3.1* 4.3  CL 94*  --  102  CO2 28  --  25  GLUCOSE 102*  --  82  BUN 13  --  15  CREATININE 1.28*  --  1.27*  CALCIUM 9.0  --  8.5*   LFT Recent Labs    04/09/21 0532  PROT 6.2*  ALBUMIN 2.2*  AST 44*  ALT 34  ALKPHOS 92  BILITOT 0.9   PT/INR Recent Labs    04/09/21 0532  LABPROT 17.7*  INR 1.5*     Studies/Results: CT  Angio Chest Pulmonary Embolism (PE) W or WO Contrast  Result Date: 04/09/2021 CLINICAL DATA:  PE suspected, high prob. Right rib and back pain. Fever. EXAM: CT ANGIOGRAPHY CHEST WITH CONTRAST TECHNIQUE: Multidetector CT imaging of the chest was performed using the standard protocol during bolus administration of intravenous contrast. Multiplanar CT image reconstructions and MIPs were obtained to evaluate the vascular anatomy. CONTRAST:  37m OMNIPAQUE IOHEXOL 350 MG/ML SOLN COMPARISON:  None. FINDINGS: Cardiovascular: No filling defects in the pulmonary arteries to suggest pulmonary emboli. Heart is normal size. Aorta is normal caliber. Mediastinum/Nodes: No mediastinal, hilar, or axillary adenopathy. Trachea and esophagus are unremarkable. Thyroid unremarkable. Lungs/Pleura: Moderate right pleural effusion and small left pleural effusion. Dependent atelectasis in the lower lobes bilaterally. Upper Abdomen: Numerous cysts in the visualized liver and kidneys compatible with polycystic kidney disease. Musculoskeletal: Bilateral breast implants. Chest wall soft tissues are unremarkable. No acute bony abnormality. Review of the MIP images confirms the above findings. IMPRESSION: No evidence of pulmonary embolus. Moderate right pleural effusion and small left pleural effusion. Dependent atelectasis. Electronically Signed   By: KRolm BaptiseM.D.   On: 04/09/2021 00:13   UKoreaAbdomen Complete  Result Date: 04/08/2021 CLINICAL DATA:  Right upper quadrant pain for 3 days, history of polycystic kidney disease EXAM: ABDOMEN ULTRASOUND COMPLETE COMPARISON:  CT abdomen pelvis, 04/05/2021 FINDINGS: Gallbladder: No gallstones or wall thickening visualized. No sonographic Murphy sign noted by sonographer. Common bile duct: Diameter: 8 mm. Liver: Numerous liver cysts. Within normal limits in parenchymal echogenicity. Portal vein is patent on color Doppler imaging with normal direction of blood flow towards the liver. IVC: No  abnormality visualized. Pancreas: Visualized portion unremarkable. Spleen: Size and appearance within normal limits. Right Kidney: Length: 16.4 cm. The kidney is grossly enlarged by numerous cysts. Echogenicity within normal limits. No mass or hydronephrosis visualized. Left Kidney: Length: 18.1 cm. The kidney is grossly  enlarged by numerous cysts. Echogenicity within normal limits. No mass or hydronephrosis visualized. Abdominal aorta: No aneurysm visualized. Other findings: Small right pleural effusion. IMPRESSION: 1. No definite acute abnormality. 2. Common bile duct measures 8 mm, mildly dilated. Patency may be further evaluated by HIDA or MRCP if indicated by clinical evidence of cholestasis. 3. Polycystic kidney and liver disease. 4. Small right pleural effusion. Electronically Signed   By: Eddie Candle M.D.   On: 04/08/2021 13:57   MR 3D Recon At Scanner  Result Date: 04/09/2021 CLINICAL DATA:  55 year old female with history of right upper quadrant abdominal pain. Possible common bile duct dilatation noted on recent abdominal ultrasound. Follow-up study. EXAM: MRI ABDOMEN WITHOUT AND WITH CONTRAST (INCLUDING MRCP) TECHNIQUE: Multiplanar multisequence MR imaging of the abdomen was performed both before and after the administration of intravenous contrast. Heavily T2-weighted images of the biliary and pancreatic ducts were obtained, and three-dimensional MRCP images were rendered by post processing. CONTRAST:  70m GADAVIST GADOBUTROL 1 MMOL/ML IV SOLN COMPARISON:  No prior abdominal MRI. Abdominal ultrasound 04/08/2021. CT the abdomen and pelvis 04/05/2021. FINDINGS: Lower chest: Small bilateral pleural effusions (right greater than left) lying dependently. Bilateral breast implants are incidentally noted. Hepatobiliary: There are innumerable hepatic lesions which are predominantly T1 hypointense, T2 hyperintense, and demonstrate no internal enhancement, compatible with a combination of simple cysts and  biliary hamartomas. Some of the larger cyst demonstrate varying degrees of complexity, including the largest lesion involving portions of segments 7 and 8 (axial image 9 of series 15 and coronal image 20 of series 5) which measures 8.4 x 7.6 x 7.3 cm and has several internal septations measuring up to 4 mm in thickness, which demonstrates some low-level areas of enhancement. One of the loculations of the cyst also contains some dependent T1 hyperintense material (axial image 15 of series 17), most compatible with a proteinaceous/hemorrhagic debris. No other definite enhancing hepatic lesions are confidently identified. Gallbladder is normal in appearance. No intrahepatic biliary ductal dilatation noted on MRCP images. However, common bile duct is dilated up to 11 mm in the porta hepatis. There are no filling defects within the common bile duct to suggest choledocholithiasis. Ductal dilatation terminates abruptly at the level of the ampulla. Pancreas: No pancreatic mass. No pancreatic ductal dilatation. No pancreatic or peripancreatic fluid collections or inflammatory changes. Spleen:  Unremarkable. Adrenals/Urinary Tract: Kidneys are essentially completely replaced with innumerable cystic lesions of varying degrees of complexity. The majority of these are T1 hypointense, T2 hyperintense and do not enhance, compatible with simple cysts. Many of these lesions demonstrate some thin internal in septations (Bosniak class 2). Some small lesions demonstrate T1 hyperintensity and mild T2 hypointensity, without definite internal enhancement, compatible with proteinaceous/hemorrhagic cysts. No definite suspicious renal lesions are confidently identified on today's examination. No hydroureteronephrosis in the visualized portions of the abdomen. Bilateral adrenal glands are normal in appearance. Stomach/Bowel: Visualized portions are unremarkable. Vascular/Lymphatic: No aneurysm identified in the visualized abdominal  vasculature. No lymphadenopathy noted in the abdomen. Other: No significant volume of ascites noted in the visualized portions of the peritoneal cavity. Musculoskeletal: No aggressive appearing osseous lesions are noted in the visualized portions of the skeleton. IMPRESSION: 1. 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 2. Dilatation of the common bile duct which measures up to 10 mm in diameter. However, there is no choledocholithiasis, evidence of obstructing mass, pancreatic ductal dilatation, or intrahepatic biliary ductal  dilatation to clearly indicate obstruction. This is favored to reflect benign dilatation of the common bile duct, likely related to autosomal dominant polycystic kidney disease, as there is a known association. 3. Small bilateral pleural effusions (right greater than left). 4. Additional incidental findings, as above. Electronically Signed   By: Vinnie Langton M.D.   On: 04/09/2021 06:31   DG Chest Portable 1 View  Result Date: 04/08/2021 CLINICAL DATA:  Shortness of breath and fever. EXAM: PORTABLE CHEST 1 VIEW COMPARISON:  04/05/2021 FINDINGS: The cardiac silhouette, mediastinal and hilar contours are within normal limits. A small left effusion is now noted. No infiltrates or edema. No pneumothorax. The bony thorax is intact. IMPRESSION: Small left pleural effusion. Electronically Signed   By: Marijo Sanes M.D.   On: 04/08/2021 12:44   MR ABDOMEN MRCP W WO CONTAST  Result Date: 04/09/2021 CLINICAL DATA:  55 year old female with history of right upper quadrant abdominal pain. Possible common bile duct dilatation noted on recent abdominal ultrasound. Follow-up study. EXAM: MRI ABDOMEN WITHOUT AND WITH CONTRAST (INCLUDING MRCP) TECHNIQUE: Multiplanar multisequence MR imaging of the abdomen was performed both before and after the administration of intravenous contrast.  Heavily T2-weighted images of the biliary and pancreatic ducts were obtained, and three-dimensional MRCP images were rendered by post processing. CONTRAST:  24m GADAVIST GADOBUTROL 1 MMOL/ML IV SOLN COMPARISON:  No prior abdominal MRI. Abdominal ultrasound 04/08/2021. CT the abdomen and pelvis 04/05/2021. FINDINGS: Lower chest: Small bilateral pleural effusions (right greater than left) lying dependently. Bilateral breast implants are incidentally noted. 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 Some of the larger cyst demonstrate varying degrees of complexity, including the largest lesion involving portions of segments 7 and 8 (axial image 9 of series 15 and coronal image 20 of series 5) which measures 8.4 x 7.6 x 7.3 cm and has several internal septations measuring up to 4 mm in thickness, which demonstrates some low-level areas of enhancement. One of the loculations of the cyst also contains some dependent T1 hyperintense material (axial image 15 of series 17), most compatible with a proteinaceous/hemorrhagic debris. No other definite enhancing hepatic lesions are confidently identified. Gallbladder is normal in appearance. No intrahepatic biliary ductal dilatation noted on MRCP images. However, common bile duct is dilated up to 11 mm in the porta hepatis. There are no filling defects within the common bile duct to suggest choledocholithiasis. Ductal dilatation terminates abruptly at the level of the ampulla. Pancreas: No pancreatic mass. No pancreatic ductal dilatation. No pancreatic or peripancreatic fluid collections or inflammatory changes. Spleen:  Unremarkable. Adrenals/Urinary Tract: Kidneys are essentially completely replaced with innumerable cystic lesions of varying degrees of complexity. The majority of these are T1  hypointense, T2 hyperintense and do not enhance, compatible with simple cysts. Many of these lesions demonstrate some thin internal in septations (Bosniak class 2). Some small lesions demonstrate T1 hyperintensity and mild T2 hypointensity, without definite internal enhancement, compatible with proteinaceous/hemorrhagic cysts. No definite suspicious renal lesions are confidently identified on today's examination. No hydroureteronephrosis in the visualized portions of the abdomen. Bilateral adrenal glands are normal in appearance. Stomach/Bowel: Visualized portions are unremarkable. Vascular/Lymphatic: No aneurysm identified in the visualized abdominal vasculature. No lymphadenopathy noted in the abdomen. Other: No significant volume of ascites noted in the visualized portions of the peritoneal cavity. Musculoskeletal: No aggressive appearing osseous lesions are noted in the visualized portions of  the skeleton. IMPRESSION: 1. 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 2. Dilatation of the common bile duct which measures up to 10 mm in diameter. However, there is no choledocholithiasis, evidence of obstructing mass, pancreatic ductal dilatation, or intrahepatic biliary ductal dilatation to clearly indicate obstruction. This is favored to reflect benign dilatation of the common bile duct, likely related to autosomal dominant polycystic kidney disease, as there is a known association. 3. Small bilateral pleural effusions (right greater than left). 4. Additional incidental findings, as above. Electronically Signed   By: Vinnie Langton M.D.   On: 04/09/2021 06:31    Impression: CBD ductal dilation: Clinical presentation concerning for cholangitis, possibly due to chlorthalidone - MRI/MRCP 9/8: Innumerable cysts of varying degrees of complexity in the kidneys bilaterally and the liver, compatible  with autosomal dominant polycystic kidney disease. Dilatation of the common bile duct which measures up to 10 mm in diameter. However, there is no choledocholithiasis, evidence of obstructing mass, pancreatic ductal dilatation, or intrahepatic biliary ductal dilatation to clearly indicate obstruction. This is favored to reflect benign dilatation of the common bile duct, likely related to autosomal dominant polycystic kidney disease, as there is a known association. Small bilateral pleural effusions (right greater than left). - LFTs normal with exception of mildly elevated AST 44.  T. Bili 0.9/ ALT 34/ ALP 92 -Leukocytosis: WBCs 13.6 -Mildly elevated INR 1.5  Polycystic kidney disease   Plan: Continue IV Zosyn  Continue pain management and supportive care.  Will continue to follow, if no improvement in symptoms, recommend possible EUS vs ERCP for further evaluation of common bile duct dilation/possible cholangitis.  After acute issues have resolved, patient will need outpatient screening colonoscopy.  [facility] will follow   LOS: 1 day   Garnette Scheuermann  PA-C 04/09/2021, 1:23 PM  Contact #  [phone]

## 2021-04-09 NOTE — Progress Notes (Signed)
   04/09/21 1811  Assess: MEWS Score  Temp 99.1 F (37.3 C)  BP 117/81  Pulse Rate (!) 121  Resp 18  SpO2 93 %  Assess: MEWS Score  MEWS Temp 0  MEWS Systolic 0  MEWS Pulse 2  MEWS RR 0  MEWS LOC 0  MEWS Score 2  MEWS Score Color Yellow  Assess: if the MEWS score is Yellow or Red  Were vital signs taken at a resting state? Yes  Focused Assessment Change from prior assessment (see assessment flowsheet)  Does the patient meet 2 or more of the SIRS criteria? No  Does the patient have a confirmed or suspected source of infection? Yes  MEWS guidelines implemented *See Row Information* Yes  Treat  MEWS Interventions Administered prn meds/treatments  Pain Scale 0-10  Pain Score 4  Pain Location Abdomen  Pain Descriptors / Indicators Aching  Pain Intervention(s) Medication (See eMAR)  Take Vital Signs  Increase Vital Sign Frequency  Yellow: Q 2hr X 2 then Q 4hr X 2, if remains yellow, continue Q 4hrs  Escalate  MEWS: Escalate Yellow: discuss with charge nurse/RN and consider discussing with provider and RRT  Notify: Charge Nurse/RN  Name of Charge Nurse/RN Notified Melissa ,RN  Date Charge Nurse/RN Notified 04/09/21  Time Charge Nurse/RN Notified 1812  Notify: Provider  Provider Name/Title Dr  Karleen Hampshire  Date Provider Notified 04/09/21  Time Provider Notified 1815  Notification Type Page  Notification Reason Change in status  Provider response No new orders  Date of Provider Response 04/09/21  Time of Provider Response 1815  Document  Patient Outcome Other (Comment) (given pain med)  Progress note created (see row info) Yes  Assess: SIRS CRITERIA  SIRS Temperature  0  SIRS Pulse 1  SIRS Respirations  0  SIRS WBC 0  SIRS Score Sum  1

## 2021-04-09 NOTE — Progress Notes (Signed)
PROGRESS NOTE    Judy Manning  [id number] DOB: 09-16-1965 DOA: 04/08/2021 PCP: Eunice Blase, MD   Chief Complaint  Patient presents with   Fever   Pain    Brief Narrative:  55 year old lady with prior history of polycystic kidney disease, hypertension, migraine headaches presents with right flank pain right-sided abdominal pain started about 2 weeks ago associated with fevers and some nausea and vomiting.  She was recently seen in urgent care on 03/30/2021 was given a prescription for azithromycin and sent home.  Patient reports her symptoms did not improve.  She underwent an MRI of the abdomen which showed mildly dilated common bile duct of 8 mm remains febrile with leukocytosis.    Assessment & Plan:   Principal Problem:   Right upper quadrant pain Active Problems:   Polycystic kidney disease   SIRS (systemic inflammatory response syndrome) (HCC)   Common bile duct dilatation   History of migraine headaches   Hypokalemia, inadequate intake   Hyponatremia   Right upper quadrant pain/ right flank pain.  Unclear etiology ? Dilated CBD for 2 weeks now.  PE ruled out.  Empirically on IV antibiotics, pain control, IV fluids.  GI Consulted to see if this is cholangitis. She is febrile, has leukocytosis, elevated pro calcitonin. Near normal liver enzymes.  Blood cultures are pending.  UA is not sig for UTI.     SIRS: Improving . Monitor blood cultures    PKD:  Pt reports she has been off Jynarque/ Tolvapton since 10 days, since the onset of fevers. She follows up with Dr Justin Mend outpatient.  Nephrology consulted.    Hypomagnesemia and hypokalemia Replaced.    Elevated troponin  Suspect from demand ischemia from SIRS.  Repeat EKG.  Will get echocardiogram for further eval.    Stage 2 CKD - monitor renal parameters.    Normocytic anemia:  ? Blood loss in to the cytic lesions, baseline hemoglobin between 11 to 12.  Continue to monitor.      DVT  prophylaxis: (lovenox. ) Code Status: (Full Code) Family Communication: None at bedside.  Disposition:   Status is: Inpatient  Remains inpatient appropriate because:Ongoing diagnostic testing needed not appropriate for outpatient work up, Unsafe d/c plan, and IV treatments appropriate due to intensity of illness or inability to take PO  Dispo: The patient is from: Home              Anticipated d/c is to:  pending.              Patient currently is not medically stable to d/c.   Difficult to place patient No       Consultants:  Gastroenterology Nephrology.   Procedures: none.   Antimicrobials:   Antibiotics Given (last 72 hours)     Date/Time Action Medication Dose Rate   04/08/21 1750 New Bag/Given   cefTRIAXone (ROCEPHIN) 1 g in sodium chloride 0.9 % 100 mL IVPB 1 g 200 mL/hr   04/09/21 0030 New Bag/Given   piperacillin-tazobactam (ZOSYN) IVPB 3.375 g 3.375 g 12.5 mL/hr   04/09/21 0957 New Bag/Given   piperacillin-tazobactam (ZOSYN) IVPB 3.375 g 3.375 g 12.5 mL/hr         Subjective: Severe pain in the abdomen.   Objective: Vitals:   04/08/21 2216 04/09/21 0154 04/09/21 0639 04/09/21 1027  BP: 112/67 117/62 (!) 112/56 (!) 123/51  Pulse: (!) 104 (!) 108 (!) 106 95  Resp: '20 18 20 20  '$ Temp: 99.5 F (37.5 C) 99.5 F (  37.5 C) 99.3 F (37.4 C) 99.3 F (37.4 C)  TempSrc: Oral Oral Oral Oral  SpO2: 91% 90% (!) 85% 92%  Weight:      Height:        Intake/Output Summary (Last 24 hours) at 04/09/2021 1138 Last data filed at 04/08/2021 1830 Gross per 24 hour  Intake 1104.09 ml  Output --  Net 1104.09 ml   Filed Weights   04/08/21 1120  Weight: 53.5 kg    Examination:  General exam: Lady in moderate distress from abdominal pain Respiratory system: Diminished air entry at bases, some tachypnea Cardiovascular system: S1 & S2 heard, tachycardic, no pedal edema Gastrointestinal system: Abdomen is soft, tenderness generalized, nondistended bowel sounds  normal Central nervous system: Alert and oriented Extremities: no pedal edema.  Skin: No rashes seen.  Psychiatry: anxious.     Data Reviewed: I have personally reviewed following labs and imaging studies  CBC: Recent Labs  Lab 04/05/21 1906 04/08/21 1211 04/08/21 1214 04/09/21 0532  WBC 10.5 13.6*  --  13.6*  NEUTROABS 8.7* 11.7*  --  11.9*  HGB 11.3* 10.6* 11.2* 10.1*  HCT 33.9* 32.7* 33.0* 31.1*  MCV 80.3 81.8  --  82.5  PLT 295 390  --  409*    Basic Metabolic Panel: Recent Labs  Lab 04/05/21 1906 04/08/21 1211 04/08/21 1214 04/09/21 0532  NA 134* 134* 134* 139  K 3.1* 3.1* 3.1* 4.3  CL 93* 94*  --  102  CO2 27 28  --  25  GLUCOSE 101* 102*  --  82  BUN 22* 13  --  15  CREATININE 1.30* 1.28*  --  1.27*  CALCIUM 9.2 9.0  --  8.5*  MG  --   --   --  1.4*    GFR: Estimated Creatinine Clearance: 37.2 mL/min (A) (by C-G formula based on SCr of 1.27 mg/dL (H)).  Liver Function Tests: Recent Labs  Lab 04/05/21 1906 04/08/21 1211 04/09/21 0532  AST 18 45* 44*  ALT 14 32 34  ALKPHOS 105 106 92  BILITOT 0.7 0.8 0.9  PROT 7.3 7.6 6.2*  ALBUMIN 3.6 3.5 2.2*    CBG: No results for input(s): GLUCAP in the last 168 hours.   Recent Results (from the past 240 hour(s))  ([facility]) COVID-19, Flu A+B and RSV     Status: None   Collection Time: 03/30/21  6:27 PM  Result Value Ref Range Status   SARS-CoV-2, NAA Not Detected Not Detected Final   Influenza A, NAA Not Detected Not Detected Final   Influenza B, NAA Not Detected Not Detected Final   RSV, NAA Not Detected Not Detected Final   Test Information: Comment  Final    Comment: This nucleic acid amplification test was developed and its performance characteristics determined by [employer]. Nucleic acid amplification tests include RT-PCR and TMA. This test has not been FDA cleared or approved. This test has been authorized by FDA under an Emergency Use Authorization (EUA). This test is only  authorized for the duration of time the declaration that circumstances exist justifying the authorization of the emergency use of in vitro diagnostic tests for detection of SARS-CoV-2 virus and/or diagnosis of COVID-19 infection under section 564(b)(1) of the Act, 21 U.S.C. [id number]) (1), unless the authorization is terminated or revoked sooner. When diagnostic testing is negative, the possibility of a false negative result should be considered in the context of a patient's recent exposures and the presence of clinical signs and symptoms  consistent with COVID-19. An individual without symptoms of COVID-19 and who is not shedding SARS-CoV-2 virus wo uld expect to have a negative (not detected) result in this assay.   Culture, blood (routine x 2)     Status: None (Preliminary result)   Collection Time: 04/05/21  6:40 PM   Specimen: BLOOD  Result Value Ref Range Status   Specimen Description   Final    BLOOD RIGHT ANTECUBITAL Performed at [facility], [address], [place], [place] [address]    Special Requests   Final    Blood Culture adequate volume BOTTLES DRAWN AEROBIC AND ANAEROBIC Performed at [facility], [address], [place], [place] [address]    Culture   Final    NO GROWTH 4 DAYS Performed at [facility], [place] [address], [place], [place] [address]    Report Status PENDING  Incomplete  Culture, blood (routine x 2)     Status: None (Preliminary result)   Collection Time: 04/05/21  6:55 PM   Specimen: BLOOD  Result Value Ref Range Status   Specimen Description   Final    BLOOD LEFT ANTECUBITAL Performed at [facility], [address] [place], [place] [address]    Special Requests   Final    Blood Culture adequate volume BOTTLES DRAWN AEROBIC AND ANAEROBIC Performed at [facility], [address], [place], [place] [address]    Culture   Final    NO GROWTH 4 DAYS Performed  at [facility], [place] [address], [place], [place] [address]    Report Status PENDING  Incomplete  Blood culture (routine x 2)     Status: None (Preliminary result)   Collection Time: 04/08/21 12:00 PM   Specimen: BLOOD RIGHT FOREARM  Result Value Ref Range Status   Specimen Description   Final    BLOOD RIGHT FOREARM Performed at [facility], [address], [place], [place] [address]    Special Requests   Final    BOTTLES DRAWN AEROBIC AND ANAEROBIC Blood Culture adequate volume   Culture   Final    NO GROWTH < 24 HOURS Performed at [facility], [place] [address] [place],  [address]    Report Status PENDING  Incomplete  Blood culture (routine x 2)     Status: None (Preliminary result)   Collection Time: 04/08/21 12:09 PM   Specimen: BLOOD  Result Value Ref Range Status   Specimen Description BLOOD LEFT ANTECUBITAL  Final   Special Requests   Final    BOTTLES DRAWN AEROBIC AND ANAEROBIC Blood Culture adequate volume   Culture   Final    NO GROWTH < 24 HOURS Performed at [facility], [place] [address], [place],  [address]    Report Status PENDING  Incomplete  Resp Panel by RT-PCR (Flu A&B, Covid) Nasopharyngeal Swab     Status: None   Collection Time: 04/08/21  5:38 PM   Specimen: Nasopharyngeal Swab; Nasopharyngeal(NP) swabs in vial transport medium  Result Value Ref Range Status   SARS Coronavirus 2 by RT PCR NEGATIVE NEGATIVE Final    Comment: (NOTE) SARS-CoV-2 target nucleic acids are NOT DETECTED.  The SARS-CoV-2 RNA is generally detectable in upper respiratory specimens during the acute phase of infection. The lowest concentration of SARS-CoV-2 viral copies this assay can detect is 138 copies/mL. A negative result does not preclude SARS-Cov-2 infection and should not be used as the sole basis for treatment  or other patient management decisions. A negative result may occur with  improper specimen collection/handling,  submission of specimen other than nasopharyngeal swab, presence of viral mutation(s) within the areas targeted by this assay, and inadequate number of viral copies(<138 copies/mL). A negative result must be combined with clinical observations, patient history, and epidemiological information. The expected result is Negative.  Fact Sheet for Patients:  EntrepreneurPulse.com.au  Fact Sheet for Healthcare Providers:  IncredibleEmployment.be  This test is no t yet approved or cleared by the [place] FDA and  has been authorized for detection and/or diagnosis of SARS-CoV-2 by FDA under an Emergency Use Authorization (EUA). This EUA will remain  in effect (meaning this test can be used) for the duration of the COVID-19 declaration under Section 564(b)(1) of the Act, 21 U.S.C.section 360bbb-3(b)(1), unless the authorization is terminated  or revoked sooner.       Influenza A by PCR NEGATIVE NEGATIVE Final   Influenza B by PCR NEGATIVE NEGATIVE Final    Comment: (NOTE) The Xpert Xpress SARS-CoV-2/FLU/RSV plus assay is intended as an aid in the diagnosis of influenza from Nasopharyngeal swab specimens and should not be used as a sole basis for treatment. Nasal washings and aspirates are unacceptable for Xpert Xpress SARS-CoV-2/FLU/RSV testing.  Fact Sheet for Patients: EntrepreneurPulse.com.au  Fact Sheet for Healthcare Providers: IncredibleEmployment.be  This test is not yet approved or cleared by the [place] FDA and has been authorized for detection and/or diagnosis of SARS-CoV-2 by FDA under an Emergency Use Authorization (EUA). This EUA will remain in effect (meaning this test can be used) for the duration of the COVID-19 declaration under Section 564(b)(1) of the Act, 21 U.S.C. section 360bbb-3(b)(1), unless the authorization is terminated or revoked.  Performed at [facility],  [address], [place], [place] [address]          Radiology Studies: CT Angio Chest Pulmonary Embolism (PE) W or WO Contrast  Result Date: 04/09/2021 CLINICAL DATA:  PE suspected, high prob. Right rib and back pain. Fever. EXAM: CT ANGIOGRAPHY CHEST WITH CONTRAST TECHNIQUE: Multidetector CT imaging of the chest was performed using the standard protocol during bolus administration of intravenous contrast. Multiplanar CT image reconstructions and MIPs were obtained to evaluate the vascular anatomy. CONTRAST:  58m OMNIPAQUE IOHEXOL 350 MG/ML SOLN COMPARISON:  None. FINDINGS: Cardiovascular: No filling defects in the pulmonary arteries to suggest pulmonary emboli. Heart is normal size. Aorta is normal caliber. Mediastinum/Nodes: No mediastinal, hilar, or axillary adenopathy. Trachea and esophagus are unremarkable. Thyroid unremarkable. Lungs/Pleura: Moderate right pleural effusion and small left pleural effusion. Dependent atelectasis in the lower lobes bilaterally. Upper Abdomen: Numerous cysts in the visualized liver and kidneys compatible with polycystic kidney disease. Musculoskeletal: Bilateral breast implants. Chest wall soft tissues are unremarkable. No acute bony abnormality. Review of the MIP images confirms the above findings. IMPRESSION: No evidence of pulmonary embolus. Moderate right pleural effusion and small left pleural effusion. Dependent atelectasis. Electronically Signed   By: KRolm BaptiseM.D.   On: 04/09/2021 00:13   UKoreaAbdomen Complete  Result Date: 04/08/2021 CLINICAL DATA:  Right upper quadrant pain for 3 days, history of polycystic kidney disease EXAM: ABDOMEN ULTRASOUND COMPLETE COMPARISON:  CT abdomen pelvis, 04/05/2021 FINDINGS: Gallbladder: No gallstones or wall thickening visualized. No sonographic Murphy sign noted by sonographer. Common bile duct: Diameter: 8 mm. Liver: Numerous liver cysts. Within normal limits in parenchymal echogenicity. Portal vein is patent on  color Doppler imaging with normal direction of blood flow towards  the liver. IVC: No abnormality visualized. Pancreas: Visualized portion unremarkable. Spleen: Size and appearance within normal limits. Right Kidney: Length: 16.4 cm. The kidney is grossly enlarged by numerous cysts. Echogenicity within normal limits. No mass or hydronephrosis visualized. Left Kidney: Length: 18.1 cm. The kidney is grossly enlarged by numerous cysts. Echogenicity within normal limits. No mass or hydronephrosis visualized. Abdominal aorta: No aneurysm visualized. Other findings: Small right pleural effusion. IMPRESSION: 1. No definite acute abnormality. 2. Common bile duct measures 8 mm, mildly dilated. Patency may be further evaluated by HIDA or MRCP if indicated by clinical evidence of cholestasis. 3. Polycystic kidney and liver disease. 4. Small right pleural effusion. Electronically Signed   By: Eddie Candle M.D.   On: 04/08/2021 13:57   MR 3D Recon At Scanner  Result Date: 04/09/2021 CLINICAL DATA:  55 year old female with history of right upper quadrant abdominal pain. Possible common bile duct dilatation noted on recent abdominal ultrasound. Follow-up study. EXAM: MRI ABDOMEN WITHOUT AND WITH CONTRAST (INCLUDING MRCP) TECHNIQUE: Multiplanar multisequence MR imaging of the abdomen was performed both before and after the administration of intravenous contrast. Heavily T2-weighted images of the biliary and pancreatic ducts were obtained, and three-dimensional MRCP images were rendered by post processing. CONTRAST:  93m GADAVIST GADOBUTROL 1 MMOL/ML IV SOLN COMPARISON:  No prior abdominal MRI. Abdominal ultrasound 04/08/2021. CT the abdomen and pelvis 04/05/2021. FINDINGS: Lower chest: Small bilateral pleural effusions (right greater than left) lying dependently. Bilateral breast implants are incidentally noted. Hepatobiliary: There are innumerable hepatic lesions which are predominantly T1 hypointense, T2 hyperintense, and  demonstrate no internal enhancement, compatible with a combination of simple cysts and biliary hamartomas. Some of the larger cyst demonstrate varying degrees of complexity, including the largest lesion involving portions of segments 7 and 8 (axial image 9 of series 15 and coronal image 20 of series 5) which measures 8.4 x 7.6 x 7.3 cm and has several internal septations measuring up to 4 mm in thickness, which demonstrates some low-level areas of enhancement. One of the loculations of the cyst also contains some dependent T1 hyperintense material (axial image 15 of series 17), most compatible with a proteinaceous/hemorrhagic debris. No other definite enhancing hepatic lesions are confidently identified. Gallbladder is normal in appearance. No intrahepatic biliary ductal dilatation noted on MRCP images. However, common bile duct is dilated up to 11 mm in the porta hepatis. There are no filling defects within the common bile duct to suggest choledocholithiasis. Ductal dilatation terminates abruptly at the level of the ampulla. Pancreas: No pancreatic mass. No pancreatic ductal dilatation. No pancreatic or peripancreatic fluid collections or inflammatory changes. Spleen:  Unremarkable. Adrenals/Urinary Tract: Kidneys are essentially completely replaced with innumerable cystic lesions of varying degrees of complexity. The majority of these are T1 hypointense, T2 hyperintense and do not enhance, compatible with simple cysts. Many of these lesions demonstrate some thin internal in septations (Bosniak class 2). Some small lesions demonstrate T1 hyperintensity and mild T2 hypointensity, without definite internal enhancement, compatible with proteinaceous/hemorrhagic cysts. No definite suspicious renal lesions are confidently identified on today's examination. No hydroureteronephrosis in the visualized portions of the abdomen. Bilateral adrenal glands are normal in appearance. Stomach/Bowel: Visualized portions are  unremarkable. Vascular/Lymphatic: No aneurysm identified in the visualized abdominal vasculature. No lymphadenopathy noted in the abdomen. Other: No significant volume of ascites noted in the visualized portions of the peritoneal cavity. Musculoskeletal: No aggressive appearing osseous lesions are noted in the visualized portions of the skeleton. IMPRESSION: 1. 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 2. Dilatation of the common bile duct which measures up to 10 mm in diameter. However, there is no choledocholithiasis, evidence of obstructing mass, pancreatic ductal dilatation, or intrahepatic biliary ductal dilatation to clearly indicate obstruction. This is favored to reflect benign dilatation of the common bile duct, likely related to autosomal dominant polycystic kidney disease, as there is a known association. 3. Small bilateral pleural effusions (right greater than left). 4. Additional incidental findings, as above. Electronically Signed   By: Vinnie Langton M.D.   On: 04/09/2021 06:31   DG Chest Portable 1 View  Result Date: 04/08/2021 CLINICAL DATA:  Shortness of breath and fever. EXAM: PORTABLE CHEST 1 VIEW COMPARISON:  04/05/2021 FINDINGS: The cardiac silhouette, mediastinal and hilar contours are within normal limits. A small left effusion is now noted. No infiltrates or edema. No pneumothorax. The bony thorax is intact. IMPRESSION: Small left pleural effusion. Electronically Signed   By: Marijo Sanes M.D.   On: 04/08/2021 12:44   MR ABDOMEN MRCP W WO CONTAST  Result Date: 04/09/2021 CLINICAL DATA:  55 year old female with history of right upper quadrant abdominal pain. Possible common bile duct dilatation noted on recent abdominal ultrasound. Follow-up study. EXAM: MRI ABDOMEN WITHOUT AND WITH CONTRAST (INCLUDING MRCP) TECHNIQUE: Multiplanar multisequence MR imaging of the  abdomen was performed both before and after the administration of intravenous contrast. Heavily T2-weighted images of the biliary and pancreatic ducts were obtained, and three-dimensional MRCP images were rendered by post processing. CONTRAST:  28m GADAVIST GADOBUTROL 1 MMOL/ML IV SOLN COMPARISON:  No prior abdominal MRI. Abdominal ultrasound 04/08/2021. CT the abdomen and pelvis 04/05/2021. FINDINGS: Lower chest: Small bilateral pleural effusions (right greater than left) lying dependently. Bilateral breast implants are incidentally noted. 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 Some of the larger cyst demonstrate varying degrees of complexity, including the largest lesion involving portions of segments 7 and 8 (axial image 9 of series 15 and coronal image 20 of series 5) which measures 8.4 x 7.6 x 7.3 cm and has several internal septations measuring up to 4 mm in thickness, which demonstrates some low-level areas of enhancement. One of the loculations of the cyst also contains some dependent T1 hyperintense material (axial image 15 of series 17), most compatible with a proteinaceous/hemorrhagic debris. No other definite enhancing hepatic lesions are confidently identified. Gallbladder is normal in appearance. No intrahepatic biliary ductal dilatation noted on MRCP images. However, common bile duct is dilated up to 11 mm in the porta hepatis. There are no filling defects within the common bile duct to suggest choledocholithiasis. Ductal dilatation terminates abruptly at the level of the ampulla. Pancreas: No pancreatic mass. No pancreatic ductal dilatation. No pancreatic or peripancreatic fluid collections or inflammatory changes. Spleen:  Unremarkable. Adrenals/Urinary Tract: Kidneys are essentially completely replaced with innumerable  cystic lesions of varying degrees of complexity. The majority of these are T1 hypointense, T2 hyperintense and do not enhance, compatible with simple cysts. Many of these lesions demonstrate some thin internal in septations (Bosniak class 2). Some small lesions demonstrate T1 hyperintensity and mild T2 hypointensity, without definite internal enhancement, compatible with proteinaceous/hemorrhagic cysts. No definite suspicious renal lesions are confidently identified on today's examination. No hydroureteronephrosis in the visualized portions of the abdomen. Bilateral adrenal glands are normal in appearance.  Stomach/Bowel: Visualized portions are unremarkable. Vascular/Lymphatic: No aneurysm identified in the visualized abdominal vasculature. No lymphadenopathy noted in the abdomen. Other: No significant volume of ascites noted in the visualized portions of the peritoneal cavity. Musculoskeletal: No aggressive appearing osseous lesions are noted in the visualized portions of the skeleton. IMPRESSION: 1. 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 2. Dilatation of the common bile duct which measures up to 10 mm in diameter. However, there is no choledocholithiasis, evidence of obstructing mass, pancreatic ductal dilatation, or intrahepatic biliary ductal dilatation to clearly indicate obstruction. This is favored to reflect benign dilatation of the common bile duct, likely related to autosomal dominant polycystic kidney disease, as there is a known association. 3. Small bilateral pleural effusions (right greater than left). 4. Additional incidental findings, as above. Electronically Signed   By: Vinnie Langton M.D.   On: 04/09/2021 06:31        Scheduled Meds:  chlorthalidone  25 mg Oral Daily   enoxaparin (LOVENOX) injection  40 mg Subcutaneous Q24H   gabapentin  300 mg Oral BID    Continuous Infusions:  lactated ringers with KCl/Additives Pediatric custom IV fluid 125 mL/hr at 04/09/21 0205   magnesium sulfate bolus IVPB     piperacillin-tazobactam (ZOSYN)  IV 3.375 g (04/09/21 0957)     LOS: 1 day        Hosie Poisson, MD [facility]   To contact the attending provider between 7A-7P or the covering provider during after hours 7P-7A, please log into the web site [email or web address] and access using universal [place] password for that web site. If you do not have the password, please call the hospital operator.  04/09/2021, 11:38 AM

## 2021-04-09 NOTE — Consult Note (Addendum)
Renal Service Consult Note [facility]  Thu Baggett 04/09/2021 Sol Blazing, MD Requesting Physician: Dr. Karleen Hampshire  Reason for Consult: Renal failure HPI: The patient is a 55 y.o. year-old w/ hx of polycystic kidney disease, HTN, migraine HA presented on 9/08 to ED c/o chills, fevers (to 104kg max), chills and abd pain.  No N/V or diarrhea. Not getting water down x 1 week here. Went to UC on 8/30, but got worse and went to ED on 9/05. CT was unremarkable and pt was sent home. Took azithro at home, now not improving. Pt admitted, had MRI/ MRCP to look at bile ducts r/o infection, etc. Started on IV abx in ED.  Asked to see for renal failure.    Pt seen in room. 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   Acute illness has been R flank/ RUQ pain w/ radiation to R shoulder, nausea and vomiting, fevers up to 104deg F for about 2 wks, no diarrhea, no cough/ SOB or CP.  Hasn't been able to eat or drink much.  Was "sent home" from ED more than once per the pt / husband.   In the hospital VSS , temp 100.5 max and now in the low 90's, here about 36 hrs. WBC 13K. COVID neg and blood cx's negative. UA is normal, no sig rbc's or wbc's.      ROS - denies CP, no joint pain, no HA, no blurry vision, no rash, no diarrhea, no nausea/ vomiting, no dysuria, no difficulty voiding   Past Medical History  Past Medical History:  Diagnosis Date   COVID 12/2020   Polycystic kidney disease    Past Surgical History  Past Surgical History:  Procedure Laterality Date   AUGMENTATION MAMMAPLASTY     Family History  Family History  Problem Relation Age of Onset   Rheum arthritis Mother    Heart disease Mother        Pacemaker   COPD Mother        Smoker   Skin cancer Mother    Cancer Mother    Stroke Father     Polycystic kidney disease Father    Arthritis Brother    Alzheimer's disease Maternal Grandmother 32   Diabetes Paternal Grandfather    Skin cancer Paternal Grandfather    Cancer Paternal Grandfather    Breast cancer Neg Hx    Colon cancer Neg Hx    Social History  reports that she has never smoked. She has never used smokeless tobacco. She reports that she does not currently use alcohol. She reports that she does not use drugs. Allergies  Allergies  Allergen Reactions   Imitrex [Sumatriptan] Swelling    Throat gets tight   Ibuprofen Other (See Comments)    Polycystic kidney disease   Nitrofurantoin Rash   Oseltamivir Nausea And Vomiting   Home medications Prior to Admission medications   Medication Sig Start Date End Date Taking? Authorizing Provider  Biotin 10000 MCG TABS Take by mouth.   Yes [provider]  cetirizine (ZYRTEC) 10 MG tablet Take 10 mg by mouth daily as needed for allergies (alternates with claritin).   Yes [provider]  chlorthalidone (HYGROTON) 25 MG tablet TAKE 1 TABLET BY MOUTH  DAILY 03/27/20  Yes Hilts, Legrand Como, MD  Cholecalciferol (VITAMIN D-3)  5000 units TABS Take by mouth.   Yes [provider]  Dapsone 5 % topical gel Apply 1 application topically 2 (two) times daily. 09/18/20  Yes Hilts, Legrand Como, MD  gabapentin (NEURONTIN) 300 MG capsule TAKE 1 CAPSULE BY MOUTH  TWICE DAILY 12/29/20  Yes Hilts, Michael, MD  JYNARQUE 90 & 30 MG TBPK  03/14/18  Yes [provider]  Multiple Vitamin (MULTIVITAMIN) tablet Take 1 tablet by mouth daily.   Yes [provider]  ondansetron (ZOFRAN ODT) 4 MG disintegrating tablet Take 1 tablet (4 mg total) by mouth every 8 (eight) hours as needed for nausea or vomiting. 04/05/21  Yes Rayna Sexton, PA-C  zolpidem (AMBIEN) 10 MG tablet TAKE 1/2 TO 1 TABLET BY  MOUTH AT BEDTIME AS NEEDED  FOR SLEEP 03/23/21  Yes Hilts, Michael, MD  tretinoin (RETIN-A) 0.025 % cream Apply topically at  bedtime. 03/23/21   Hilts, Legrand Como, MD     Vitals:   04/08/21 2216 04/09/21 0154 04/09/21 0639 04/09/21 1027  BP: 112/67 117/62 (!) 112/56 (!) 123/51  Pulse: (!) 104 (!) 108 (!) 106 95  Resp: 20 18 20 20   Temp: 99.5 F (37.5 C) 99.5 F (37.5 C) 99.3 F (37.4 C) 99.3 F (37.4 C)  TempSrc: Oral Oral Oral Oral  SpO2: 91% 90% (!) 85% 92%  Weight:      Height:       Exam Gen alert, uncomfortable, lying on her side w/ knees pulled up No rash, cyanosis or gangrene Sclera anicteric, throat clear  No jvd or bruits Chest clear bilat to bases, no rales/ wheezing RRR no MRG Abd firm w/ vol guarding, no mass or ascites +bs GU defer MS no joint effusions or deformity Ext no LE or UE edema, no wounds or ulcers Neuro is alert, Ox 3 , nf         Home meds include Hygroton, neurontin 300 bid, Jynarque 90 and 48m, MVI, prn's, vitamins   UA 9/8 - negative, trace protein      Date  Creat  eGFR   2011  1.0   2020  1.51   04/05/21 1.30  49, stage IIIA   9/8  1.28  50   9922  1.27  51     CT abd w/ contrast (50cc) - IMPRESSION: Innumerable hepatic and bilateral renal cysts most compatible with polycystic kidney disease. Small right pleural effusion. Small amount of free fluid in the pelvis, likely physiologic free fluid. No acute findings in the abdomen or pelvis.    CXR 9/05 - no active disease     [place]dom[place] 9/08 - Numerous liver cysts. Within normal limits in parenchymal echogenicity. Portal vein is patent on color Doppler imaging with normal direction of blood flow towards the liver.  R Kidney 16.4 cm. The kidney is grossly enlarged by numerous cysts. Echogenicity within normal limits. No mass or hydronephrosis visualized.  Left Kidney: 18.1 cm. The kidney is grossly enlarged by numerous cysts. Echogenicity within normal limits. No mass or hydronephrosis visualized.    CTA chest 0/90 (today) - got 60 cc IV contrast (gadavist). IMPRESSION: No evidence of pulmonary embolus.  Moderate  right pleural effusion and small left pleural effusion. Dependent atelectasis.   MR abdomen/ MRCP w/ and w/o contrast > on 9/09 showed IMPRESSION: 1. 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 2. Dilatation of the common bile duct which measures up  to 10 mm in diameter. However, there is no choledocholithiasis, evidence of obstructing mass, pancreatic ductal dilatation, or intrahepatic biliary ductal dilatation to clearly indicate obstruction. This is favored to reflect benign dilatation of the common bile duct, likely related to autosomal dominant polycystic kidney disease, as there is a known association 3. Small bilateral pleural effusions (right greater than left).  4. Additional incidental findings, as above.   BP's 115- 125 / 50- 70   HR 95- 108,  RR  21- 27      MR Abdomen - Adrenals/Urinary Tract: Kidneys are essentially completely replaced with innumerable cystic lesions of varying degrees of complexity. The majority of these are T1 hypointense, T2 hyperintense and do not enhance, compatible with simple cysts. Many of these lesions demonstrate some thin internal in septations (Bosniak class 2). Some small lesions demonstrate T1 hyperintensity and mild T2 hypointensity, without definite internal enhancement, compatible with proteinaceous/hemorrhagic cysts. No definite suspicious renal lesions are confidently identified on today's examination. No hydroureteronephrosis in the visualized portions of the abdomen.      UA 9/08  - clear , trace protein, 0-5 wbc /rbc     Na 139  K 4.3  CO2 25  BN B15   cr 1.27   Alb 2.2  Mg 1.4  Ca 8.5  LFT's ok     WBC 13 K  Hb 10.1  plt wnl   Assessment/ Plan: RUQ pain / fever/ SIRS - work up in progress, GI/ pmd, on IV zosyn. Cyst infection or cyst bleed/ hemorrhage may cause flank pain in polycystic patients , but imaging/ UA do not support this  diagnosis. Getting empiric IV abx now. No other suggestions. Will sign off, call w/ any questions.  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 On long-term tolvaptan for ADPKD. Has been holding for last 2 wks while ill.  Volume - looks euvolemic, possibly slightly dry. Got 3 L bolus in ED. Agree w/ IVF"s at 125 /hr.       Rob Chenelle Benning  MD 04/09/2021, 2:44 PM  Recent Labs  Lab 04/08/21 1211 04/08/21 1214 04/09/21 0532  WBC 13.6*  --  13.6*  HGB 10.6* 11.2* 10.1*   Recent Labs  Lab 04/08/21 1211 04/08/21 1214 04/09/21 0532  K 3.1* 3.1* 4.3  BUN 13  --  15  CREATININE 1.28*  --  1.27*  CALCIUM 9.0  --  8.5*

## 2021-04-10 ENCOUNTER — Inpatient Hospital Stay (HOSPITAL_COMMUNITY): Payer: 59

## 2021-04-10 LAB — BASIC METABOLIC PANEL
Anion gap: 8 (ref 5–15)
BUN: 11 mg/dL (ref 6–20)
CO2: 28 mmol/L (ref 22–32)
Calcium: 8.1 mg/dL — ABNORMAL LOW (ref 8.9–10.3)
Chloride: 100 mmol/L (ref 98–111)
Creatinine, Ser: 1.26 mg/dL — ABNORMAL HIGH (ref 0.44–1.00)
GFR, Estimated: 51 mL/min — ABNORMAL LOW (ref >60)
Glucose, Bld: 173 mg/dL — ABNORMAL HIGH (ref 70–99)
Potassium: 3.8 mmol/L (ref 3.5–5.1)
Sodium: 136 mmol/L (ref 135–145)

## 2021-04-10 LAB — HEPATIC FUNCTION PANEL
ALT: 56 U/L — ABNORMAL HIGH (ref 0–44)
AST: 73 U/L — ABNORMAL HIGH (ref 15–41)
Albumin: 2.2 g/dL — ABNORMAL LOW (ref 3.5–5.0)
Alkaline Phosphatase: 98 U/L (ref 38–126)
Bilirubin, Direct: 0.2 mg/dL (ref 0.0–0.2)
Indirect Bilirubin: 0.4 mg/dL (ref 0.3–0.9)
Total Bilirubin: 0.6 mg/dL (ref 0.3–1.2)
Total Protein: 6 g/dL — ABNORMAL LOW (ref 6.5–8.1)

## 2021-04-10 LAB — CBC WITH DIFFERENTIAL/PLATELET
Abs Immature Granulocytes: 0.09 10*3/uL — ABNORMAL HIGH (ref 0.00–0.07)
Basophils Absolute: 0 10*3/uL (ref 0.0–0.1)
Basophils Relative: 0 %
Eosinophils Absolute: 0.2 10*3/uL (ref 0.0–0.5)
Eosinophils Relative: 1 %
HCT: 28.7 % — ABNORMAL LOW (ref 36.0–46.0)
Hemoglobin: 9.5 g/dL — ABNORMAL LOW (ref 12.0–15.0)
Immature Granulocytes: 1 %
Lymphocytes Relative: 4 %
Lymphs Abs: 0.5 10*3/uL — ABNORMAL LOW (ref 0.7–4.0)
MCH: 27.2 pg (ref 26.0–34.0)
MCHC: 33.1 g/dL (ref 30.0–36.0)
MCV: 82.2 fL (ref 80.0–100.0)
Monocytes Absolute: 0.6 10*3/uL (ref 0.1–1.0)
Monocytes Relative: 5 %
Neutro Abs: 11.2 10*3/uL — ABNORMAL HIGH (ref 1.7–7.7)
Neutrophils Relative %: 89 %
Platelets: 415 10*3/uL — ABNORMAL HIGH (ref 150–400)
RBC: 3.49 MIL/uL — ABNORMAL LOW (ref 3.87–5.11)
RDW: 13.7 % (ref 11.5–15.5)
WBC: 12.6 10*3/uL — ABNORMAL HIGH (ref 4.0–10.5)
nRBC: 0 % (ref 0.0–0.2)

## 2021-04-10 LAB — CULTURE, BLOOD (ROUTINE X 2)
Culture: NO GROWTH
Culture: NO GROWTH
Special Requests: ADEQUATE
Special Requests: ADEQUATE

## 2021-04-10 LAB — MAGNESIUM: Magnesium: 1.5 mg/dL — ABNORMAL LOW (ref 1.7–2.4)

## 2021-04-10 IMAGING — US US FINE NEEDLE ASP 1ST LESION
1 series · 9 of 9 positions shown · non-contrast
Comparison: none

INDICATION: 54-year-old female referred for aspiration of liver cyst

[Series 1: us fine needle asp 1st lesion · 9 of 9 slices shown]
[im 1/9]
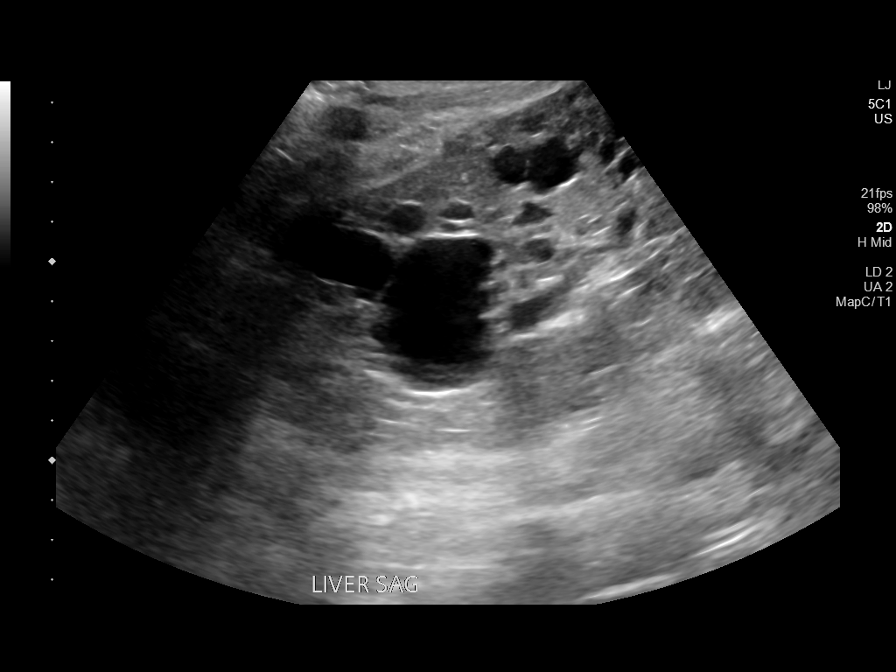
[im 2/9]
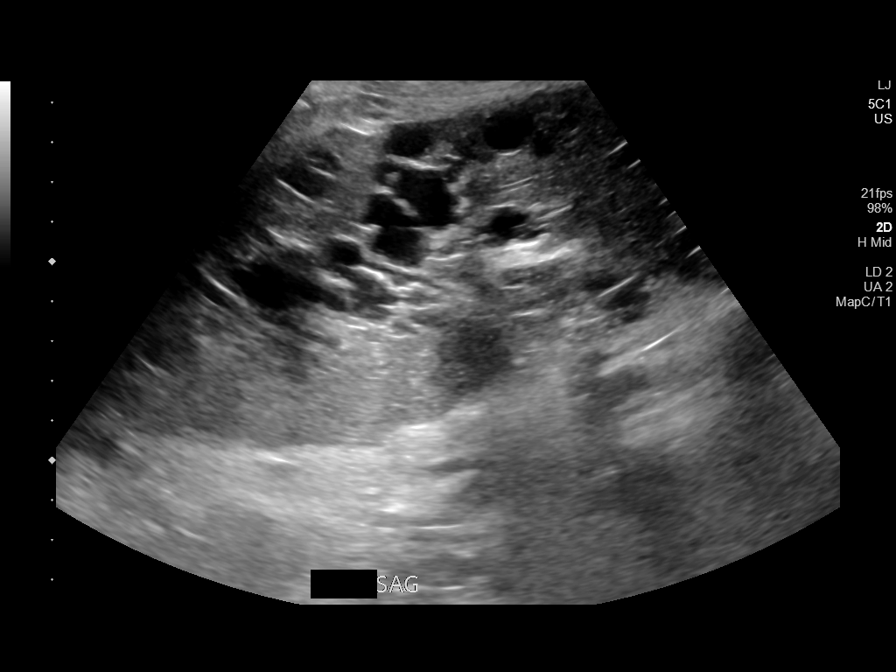
[im 3/9]
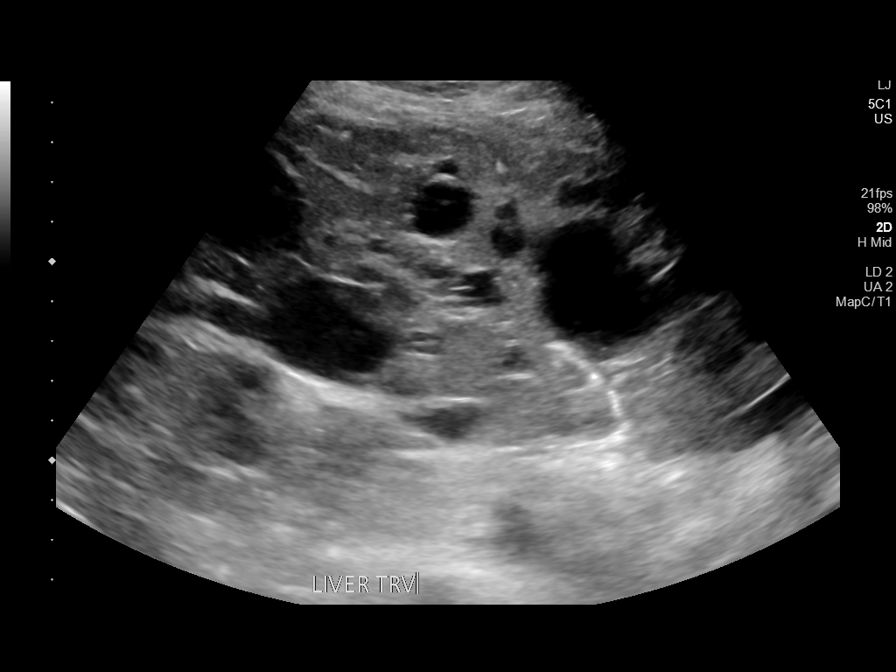
[im 4/9]
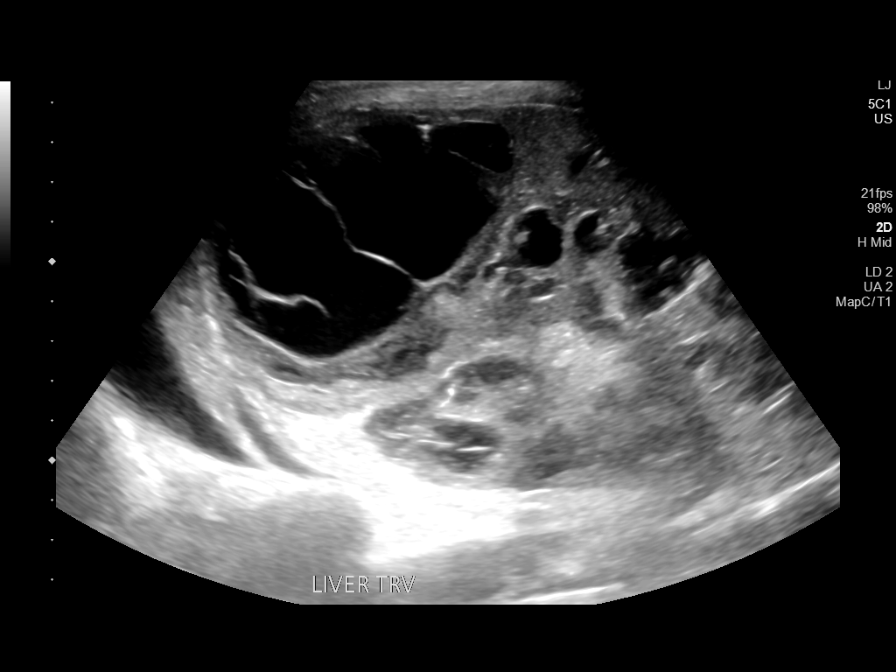
[im 5/9]
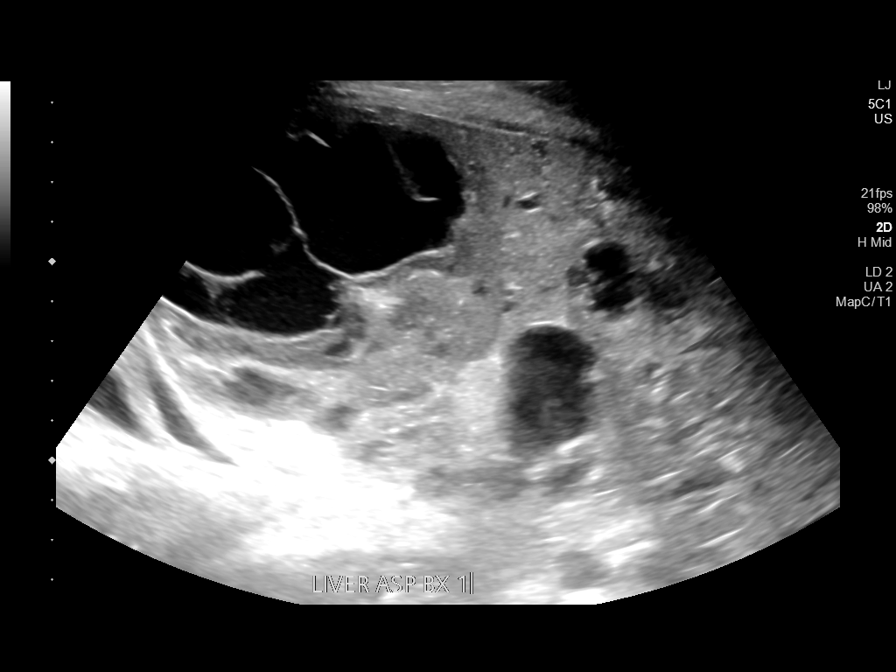
[im 6/9]
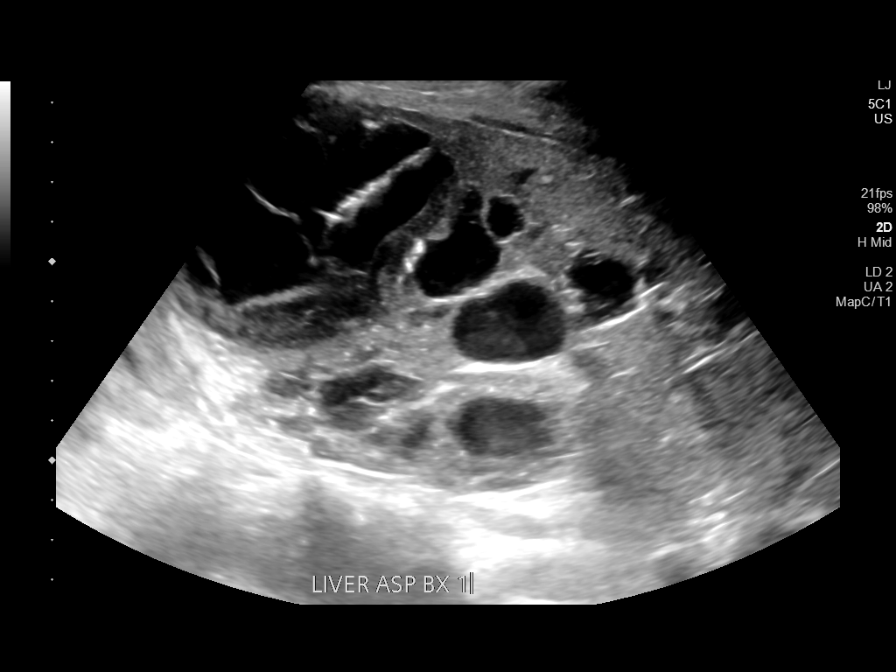
[im 7/9]
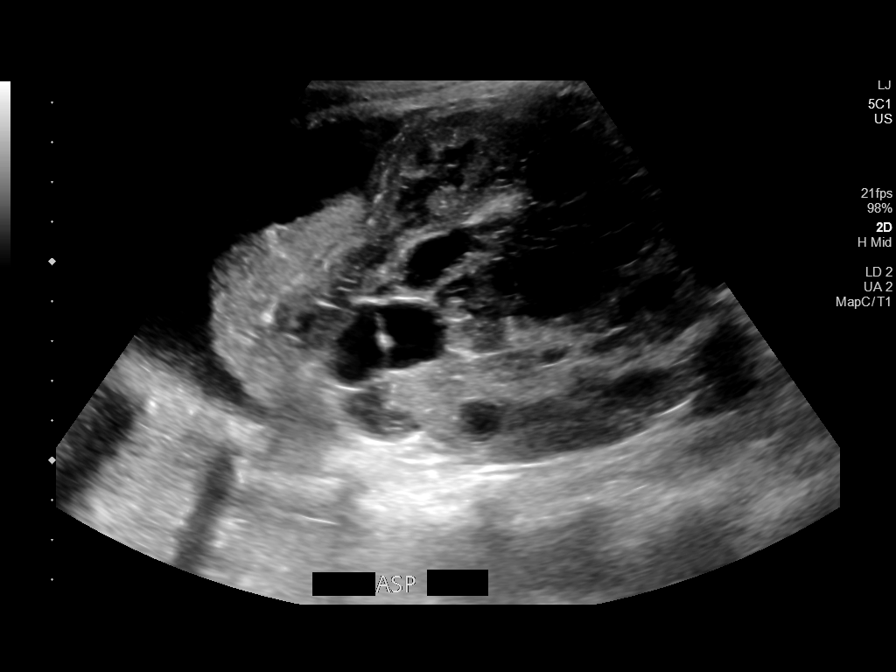
[im 8/9]
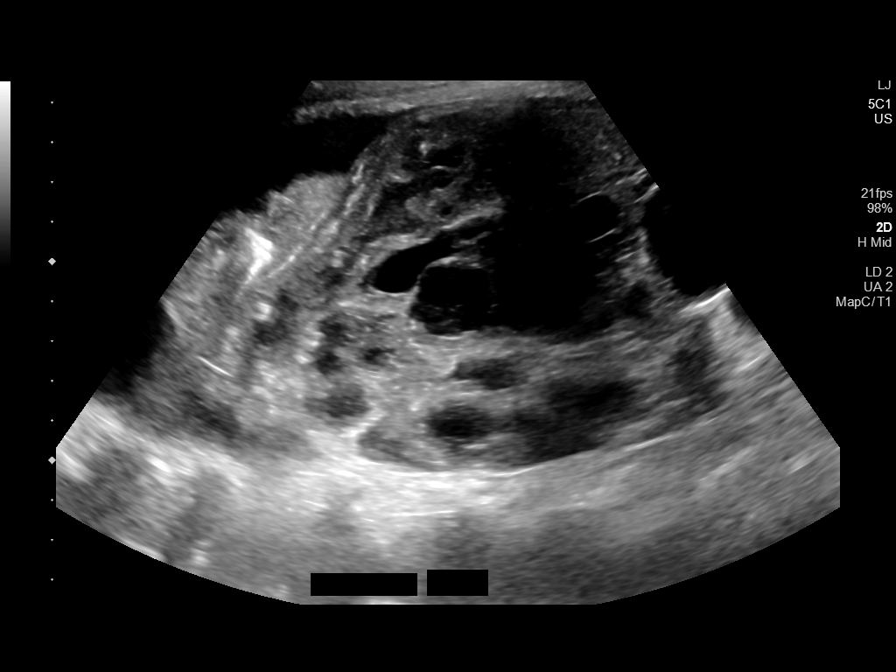
[im 9/9]
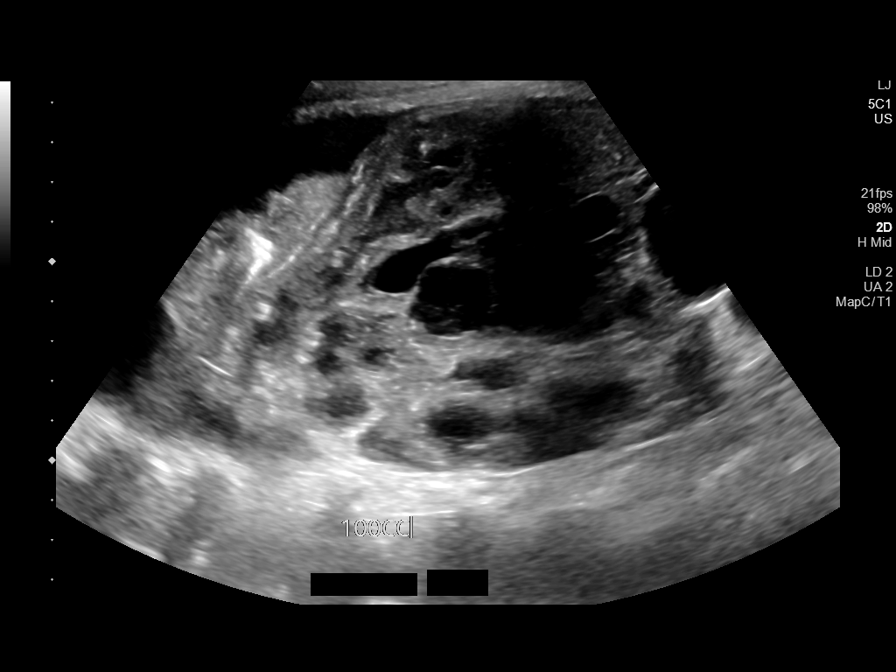

[9 of 9 positions shown; findings below may reference images not displayed]

EXAM:
IMAGE GUIDED ASPIRATION RIGHT LIVER CYST

MEDICATIONS:
None

ANESTHESIA/SEDATION:
Fentanyl 100 mcg IV; Versed 2.0 mg IV

Moderate Sedation Time:  16 minutes

The patient was continuously monitored during the procedure by the
interventional radiology nurse under my direct supervision.

COMPLICATIONS:
None

PROCEDURE:
Informed written consent was obtained from the patient after a
thorough discussion of the procedural risks, benefits and
alternatives. All questions were addressed. Maximal Sterile Barrier
Technique was utilized including caps, mask, sterile gowns, sterile
gloves, sterile drape, hand hygiene and skin antiseptic. A timeout
was performed prior to the initiation of the procedure.

Ultrasound survey of the right liver lobe performed with images
stored and sent to PACs.

The right lower thorax/right upper abdomen was prepped with
chlorhexidine in a sterile fashion, and a sterile drape was applied
covering the operative field. A sterile gown and sterile gloves were
used for the procedure. Local anesthesia was provided with 1%
Lidocaine.

The patient was prepped and draped sterilely and the skin and
subcutaneous tissues were generously infiltrated with 1% lidocaine.

Small stab incision was made with 11 blade scalpel and gentle soft
tissue spreading was performed with a hemostat. A 10 French drain
was then advanced with ultrasound guidance via trocar technique into
the targeted large cyst of the right hepatic dome. Drain was
advanced into position and the needle was removed. We then aspirated
approximally 100 cc of thin yellow fluid with minimal debris. Sample
was sent for culture. After aspiration of the fluid, the drain was
removed.

Final ultrasound image was performed.

The patient tolerated the procedure well and remained
hemodynamically stable throughout.

No complications were encountered and no significant blood loss was
encounter.
IMPRESSION: Status post ultrasound-guided aspiration of right hepatic dome cyst.

## 2021-04-10 MED ORDER — LORAZEPAM 0.5 MG PO TABS
0.5000 mg | ORAL_TABLET | Freq: Two times a day (BID) | ORAL | Status: DC | PRN
  Administered 2021-04-13: 0.5 mg via ORAL
  Filled 2021-04-10: qty 1

## 2021-04-10 MED ORDER — TRAMADOL HCL 50 MG PO TABS
100.0000 mg | ORAL_TABLET | Freq: Four times a day (QID) | ORAL | Status: DC | PRN
  Administered 2021-04-11 (×2): 100 mg via ORAL
  Filled 2021-04-10 (×2): qty 2

## 2021-04-10 MED ORDER — SODIUM CHLORIDE 0.9 % IV SOLN
INTRAVENOUS | Status: AC
  Filled 2021-04-10: qty 250

## 2021-04-10 MED ORDER — LIDOCAINE HCL 1 % IJ SOLN
INTRAMUSCULAR | Status: AC
  Filled 2021-04-10: qty 20

## 2021-04-10 MED ORDER — FENTANYL CITRATE (PF) 100 MCG/2ML IJ SOLN
INTRAMUSCULAR | Status: AC
  Filled 2021-04-10: qty 4

## 2021-04-10 MED ORDER — MIDAZOLAM HCL 2 MG/2ML IJ SOLN
INTRAMUSCULAR | Status: DC | PRN
  Administered 2021-04-10: 1 mg via INTRAVENOUS

## 2021-04-10 MED ORDER — MIDAZOLAM HCL 2 MG/2ML IJ SOLN
INTRAMUSCULAR | Status: AC
  Filled 2021-04-10: qty 4

## 2021-04-10 MED ORDER — FENTANYL CITRATE (PF) 100 MCG/2ML IJ SOLN
INTRAMUSCULAR | Status: DC | PRN
  Administered 2021-04-10: 25 ug via INTRAVENOUS

## 2021-04-10 MED ORDER — FENTANYL CITRATE (PF) 100 MCG/2ML IJ SOLN
INTRAMUSCULAR | Status: DC | PRN
  Administered 2021-04-10: 50 ug via INTRAVENOUS

## 2021-04-10 MED ORDER — MORPHINE SULFATE 1 MG/ML IV SOLN PCA
INTRAVENOUS | Status: DC
  Administered 2021-04-10 (×3): 8 mg via INTRAVENOUS
  Administered 2021-04-11: 4.69 mg via INTRAVENOUS
  Administered 2021-04-11: 9 mg via INTRAVENOUS
  Administered 2021-04-11: 11 mg via INTRAVENOUS
  Filled 2021-04-10 (×2): qty 30

## 2021-04-10 MED ORDER — ONDANSETRON HCL 4 MG/2ML IJ SOLN: 4.0000 mg | Freq: Four times a day (QID) | INTRAMUSCULAR | Status: DC | PRN

## 2021-04-10 MED ORDER — MIDAZOLAM HCL 2 MG/2ML IJ SOLN
INTRAMUSCULAR | Status: DC | PRN
  Administered 2021-04-10: .5 mg via INTRAVENOUS

## 2021-04-10 MED ORDER — NALOXONE HCL 0.4 MG/ML IJ SOLN: 0.4000 mg | INTRAMUSCULAR | Status: DC | PRN

## 2021-04-10 MED ORDER — SODIUM CHLORIDE 0.9% FLUSH: 9.0000 mL | INTRAVENOUS | Status: DC | PRN

## 2021-04-10 MED ORDER — DIPHENHYDRAMINE HCL 12.5 MG/5ML PO ELIX: 12.5000 mg | ORAL_SOLUTION | Freq: Four times a day (QID) | ORAL | Status: DC | PRN

## 2021-04-10 MED ORDER — DIPHENHYDRAMINE HCL 50 MG/ML IJ SOLN: 12.5000 mg | Freq: Four times a day (QID) | INTRAMUSCULAR | Status: DC | PRN

## 2021-04-10 NOTE — Progress Notes (Signed)
Subjective: Patient complains of severe pain in her right upper abdomen over her ribs. It is hard for her to take a deep breath. She cannot stay upright. She has not been able to lift her head and swallow pills. She reports being in excruciating pain.  Objective: Vital signs in last 24 hours: Temp:  [98.3 F (36.8 C)-101.6 F (38.7 C)] 100.5 F (38.1 C) (09/10 0728) Pulse Rate:  [93-123] 98 (09/10 0728) Resp:  [18-32] 20 (09/10 0728) BP: (112-132)/(51-98) 127/52 (09/10 0728) SpO2:  [79 %-99 %] 99 % (09/10 0728) Weight change:  Last BM Date: 04/08/21  PE: In obvious distress GENERAL: Tearful, anxious, seems to be in pain  ABDOMEN: Severe right upper quadrant tenderness, distended abdomen, sluggish bowel sounds EXTREMITIES: No deformity  Lab Results: Results for orders placed or performed during the hospital encounter of 04/08/21 (from the past 48 hour(s))  Urinalysis, Routine w reflex microscopic     Status: Abnormal   Collection Time: 04/08/21 11:35 AM  Result Value Ref Range   Color, Urine YELLOW YELLOW   APPearance CLEAR CLEAR   Specific Gravity, Urine 1.009 1.005 - 1.030   pH 6.0 5.0 - 8.0   Glucose, UA NEGATIVE NEGATIVE mg/dL   Hgb urine dipstick MODERATE (A) NEGATIVE   Bilirubin Urine NEGATIVE NEGATIVE   Ketones, ur NEGATIVE NEGATIVE mg/dL   Protein, ur TRACE (A) NEGATIVE mg/dL   Nitrite NEGATIVE NEGATIVE   Leukocytes,Ua SMALL (A) NEGATIVE   RBC / HPF 0-5 0 - 5 RBC/hpf   WBC, UA 0-5 0 - 5 WBC/hpf   Squamous Epithelial / LPF 0-5 0 - 5   Mucus PRESENT     Comment: Performed at [facility], [address], [place], [place] [address]  Blood culture (routine x 2)     Status: None (Preliminary result)   Collection Time: 04/08/21 12:00 PM   Specimen: BLOOD RIGHT FOREARM  Result Value Ref Range   Specimen Description      BLOOD RIGHT FOREARM Performed at [facility], [address], [place], [place] [address]    Special  Requests      BOTTLES DRAWN AEROBIC AND ANAEROBIC Blood Culture adequate volume   Culture      NO GROWTH < 24 HOURS Performed at [facility], [address] [place], [place] [address]    Report Status PENDING   Blood culture (routine x 2)     Status: None (Preliminary result)   Collection Time: 04/08/21 12:09 PM   Specimen: BLOOD  Result Value Ref Range   Specimen Description BLOOD LEFT ANTECUBITAL    Special Requests      BOTTLES DRAWN AEROBIC AND ANAEROBIC Blood Culture adequate volume   Culture      NO GROWTH < 24 HOURS Performed at [facility], [place] [address] [place],  [address]    Report Status PENDING   Comprehensive metabolic panel     Status: Abnormal   Collection Time: 04/08/21 12:11 PM  Result Value Ref Range   Sodium 134 (L) 135 - 145 mmol/L   Potassium 3.1 (L) 3.5 - 5.1 mmol/L   Chloride 94 (L) 98 - 111 mmol/L   CO2 28 22 - 32 mmol/L   Glucose, Bld 102 (H) 70 - 99 mg/dL    Comment: Glucose reference range applies only to samples taken after fasting for at least 8 hours.   BUN 13 6 - 20 mg/dL   Creatinine, Ser 1.28 (H) 0.44 - 1.00  mg/dL   Calcium 9.0 8.9 - 10.3 mg/dL   Total Protein 7.6 6.5 - 8.1 g/dL   Albumin 3.5 3.5 - 5.0 g/dL   AST 45 (H) 15 - 41 U/L   ALT 32 0 - 44 U/L   Alkaline Phosphatase 106 38 - 126 U/L   Total Bilirubin 0.8 0.3 - 1.2 mg/dL   GFR, Estimated 50 (L) >60 mL/min    Comment: (NOTE) Calculated using the CKD-EPI Creatinine Equation (2021)    Anion gap 12 5 - 15    Comment: Performed at [facility], [place], [place] [address]  Lipase, blood     Status: None   Collection Time: 04/08/21 12:11 PM  Result Value Ref Range   Lipase 15 11 - 51 U/L    Comment: Performed at [facility], [address], [place], [place] [address]  CBC with Differential     Status: Abnormal   Collection Time: 04/08/21 12:11 PM  Result Value Ref Range   WBC 13.6 (H) 4.0 - 10.5 K/uL    RBC 4.00 3.87 - 5.11 MIL/uL   Hemoglobin 10.6 (L) 12.0 - 15.0 g/dL   HCT 32.7 (L) 36.0 - 46.0 %   MCV 81.8 80.0 - 100.0 fL   MCH 26.5 26.0 - 34.0 pg   MCHC 32.4 30.0 - 36.0 g/dL   RDW 13.8 11.5 - 15.5 %   Platelets 390 150 - 400 K/uL   nRBC 0.0 0.0 - 0.2 %   Neutrophils Relative % 86 %   Neutro Abs 11.7 (H) 1.7 - 7.7 K/uL   Lymphocytes Relative 6 %   Lymphs Abs 0.8 0.7 - 4.0 K/uL   Monocytes Relative 7 %   Monocytes Absolute 0.9 0.1 - 1.0 K/uL   Eosinophils Relative 0 %   Eosinophils Absolute 0.1 0.0 - 0.5 K/uL   Basophils Relative 0 %   Basophils Absolute 0.0 0.0 - 0.1 K/uL   Immature Granulocytes 1 %   Abs Immature Granulocytes 0.13 (H) 0.00 - 0.07 K/uL    Comment: Performed at [facility], [place], [place] [address]  Lactic acid, plasma     Status: None   Collection Time: 04/08/21 12:11 PM  Result Value Ref Range   Lactic Acid, Venous 0.8 0.5 - 1.9 mmol/L    Comment: Performed at [facility], [address] [place], [place] [address]  I-Stat venous blood gas, [facility] ED)     Status: Abnormal   Collection Time: 04/08/21 12:14 PM  Result Value Ref Range   pH, Ven 7.413 7.250 - 7.430   pCO2, Ven 49.9 44.0 - 60.0 mmHg   pO2, Ven 21.0 (LL) 32.0 - 45.0 mmHg   Bicarbonate 31.8 (H) 20.0 - 28.0 mmol/L   TCO2 33 (H) 22 - 32 mmol/L   O2 Saturation 33.0 %   Acid-Base Excess 6.0 (H) 0.0 - 2.0 mmol/L   Sodium 134 (L) 135 - 145 mmol/L   Potassium 3.1 (L) 3.5 - 5.1 mmol/L   Calcium, Ion 1.18 1.15 - 1.40 mmol/L   HCT 33.0 (L) 36.0 - 46.0 %   Hemoglobin 11.2 (L) 12.0 - 15.0 g/dL   Sample type VENOUS    Comment NOTIFIED PHYSICIAN   Resp Panel by RT-PCR (Flu A&B, Covid) Nasopharyngeal Swab     Status: None   Collection Time: 04/08/21  5:38 PM   Specimen: Nasopharyngeal Swab; Nasopharyngeal(NP) swabs in vial transport medium  Result Value Ref Range  SARS Coronavirus 2 by RT PCR NEGATIVE NEGATIVE    Comment: (NOTE) SARS-CoV-2  target nucleic acids are NOT DETECTED.  The SARS-CoV-2 RNA is generally detectable in upper respiratory specimens during the acute phase of infection. The lowest concentration of SARS-CoV-2 viral copies this assay can detect is 138 copies/mL. A negative result does not preclude SARS-Cov-2 infection and should not be used as the sole basis for treatment or other patient management decisions. A negative result may occur with  improper specimen collection/handling, submission of specimen other than nasopharyngeal swab, presence of viral mutation(s) within the areas targeted by this assay, and inadequate number of viral copies(<138 copies/mL). A negative result must be combined with clinical observations, patient history, and epidemiological information. The expected result is Negative.  Fact Sheet for Patients:  EntrepreneurPulse.com.au  Fact Sheet for Healthcare Providers:  IncredibleEmployment.be  This test is no t yet approved or cleared by the [place] FDA and  has been authorized for detection and/or diagnosis of SARS-CoV-2 by FDA under an Emergency Use Authorization (EUA). This EUA will remain  in effect (meaning this test can be used) for the duration of the COVID-19 declaration under Section 564(b)(1) of the Act, 21 U.S.C.section 360bbb-3(b)(1), unless the authorization is terminated  or revoked sooner.       Influenza A by PCR NEGATIVE NEGATIVE   Influenza B by PCR NEGATIVE NEGATIVE    Comment: (NOTE) The Xpert Xpress SARS-CoV-2/FLU/RSV plus assay is intended as an aid in the diagnosis of influenza from Nasopharyngeal swab specimens and should not be used as a sole basis for treatment. Nasal washings and aspirates are unacceptable for Xpert Xpress SARS-CoV-2/FLU/RSV testing.  Fact Sheet for Patients: EntrepreneurPulse.com.au  Fact Sheet for Healthcare Providers: IncredibleEmployment.be  This  test is not yet approved or cleared by the [place] FDA and has been authorized for detection and/or diagnosis of SARS-CoV-2 by FDA under an Emergency Use Authorization (EUA). This EUA will remain in effect (meaning this test can be used) for the duration of the COVID-19 declaration under Section 564(b)(1) of the Act, 21 U.S.C. section 360bbb-3(b)(1), unless the authorization is terminated or revoked.  Performed at [facility], [address], [place], [place] [address]   C-reactive protein     Status: Abnormal   Collection Time: 04/08/21 10:53 PM  Result Value Ref Range   CRP 23.9 (H) <1.0 mg/dL    Comment: Performed at [facility], [place] [address], [place], [place] [address]  Procalcitonin - Baseline     Status: None   Collection Time: 04/08/21 10:53 PM  Result Value Ref Range   Procalcitonin 4.47 ng/mL    Comment:        Interpretation: PCT > 2 ng/mL: Systemic infection (sepsis) is likely, unless other causes are known. (NOTE)       Sepsis PCT Algorithm           Lower Respiratory Tract                                      Infection PCT Algorithm    ----------------------------     ----------------------------         PCT < 0.25 ng/mL                PCT < 0.10 ng/mL          Strongly encourage  Strongly discourage   discontinuation of antibiotics    initiation of antibiotics    ----------------------------     -----------------------------       PCT 0.25 - 0.50 ng/mL            PCT 0.10 - 0.25 ng/mL               OR       >80% decrease in PCT            Discourage initiation of                                            antibiotics      Encourage discontinuation           of antibiotics    ----------------------------     -----------------------------         PCT >= 0.50 ng/mL              PCT 0.26 - 0.50 ng/mL               AND       <80% decrease in PCT              Encourage initiation of                                              antibiotics       Encourage continuation           of antibiotics    ----------------------------     -----------------------------        PCT >= 0.50 ng/mL                  PCT > 0.50 ng/mL               AND         increase in PCT                  Strongly encourage                                      initiation of antibiotics    Strongly encourage escalation           of antibiotics                                     -----------------------------                                           PCT <= 0.25 ng/mL                                                 OR                                        >  80% decrease in PCT                                      Discontinue / Do not initiate                                             antibiotics  Performed at [facility] [address], [place], [place] [address]   Troponin I (High Sensitivity)     Status: Abnormal   Collection Time: 04/08/21 10:53 PM  Result Value Ref Range   Troponin I (High Sensitivity) 26 (H) <18 ng/L    Comment: (NOTE) Elevated high sensitivity troponin I (hsTnI) values and significant  changes across serial measurements may suggest ACS but many other  chronic and acute conditions are known to elevate hsTnI results.  Refer to the "Links" section for chest pain algorithms and additional  guidance. Performed at [facility], [place] [address] [place], [place] [address]   HIV Antibody (routine testing w rflx)     Status: None   Collection Time: 04/09/21  5:32 AM  Result Value Ref Range   HIV Screen 4th Generation wRfx Non Reactive Non Reactive    Comment: Performed at [facility], [place] [address] [place], [place] [address]  Cortisol-am, blood     Status: None   Collection Time: 04/09/21  5:32 AM  Result Value Ref Range   Cortisol - AM 19.1 6.7 - 22.6 ug/dL    Comment: Performed at [facility], [place] [address] [place], [place] [address]   Procalcitonin     Status: None   Collection Time: 04/09/21  5:32 AM  Result Value Ref Range   Procalcitonin 3.60 ng/mL    Comment:        Interpretation: PCT > 2 ng/mL: Systemic infection (sepsis) is likely, unless other causes are known. (NOTE)       Sepsis PCT Algorithm           Lower Respiratory Tract                                      Infection PCT Algorithm    ----------------------------     ----------------------------         PCT < 0.25 ng/mL                PCT < 0.10 ng/mL          Strongly encourage             Strongly discourage   discontinuation of antibiotics    initiation of antibiotics    ----------------------------     -----------------------------       PCT 0.25 - 0.50 ng/mL            PCT 0.10 - 0.25 ng/mL               OR       >80% decrease in PCT            Discourage initiation of  antibiotics      Encourage discontinuation           of antibiotics    ----------------------------     -----------------------------         PCT >= 0.50 ng/mL              PCT 0.26 - 0.50 ng/mL               AND       <80% decrease in PCT              Encourage initiation of                                             antibiotics       Encourage continuation           of antibiotics    ----------------------------     -----------------------------        PCT >= 0.50 ng/mL                  PCT > 0.50 ng/mL               AND         increase in PCT                  Strongly encourage                                      initiation of antibiotics    Strongly encourage escalation           of antibiotics                                     -----------------------------                                           PCT <= 0.25 ng/mL                                                 OR                                        > 80% decrease in PCT                                      Discontinue / Do not initiate                                              antibiotics  Performed at [facility] [address] [place], [place] [address]   Comprehensive metabolic panel     Status: Abnormal  Collection Time: 04/09/21  5:32 AM  Result Value Ref Range   Sodium 139 135 - 145 mmol/L   Potassium 4.3 3.5 - 5.1 mmol/L   Chloride 102 98 - 111 mmol/L   CO2 25 22 - 32 mmol/L   Glucose, Bld 82 70 - 99 mg/dL    Comment: Glucose reference range applies only to samples taken after fasting for at least 8 hours.   BUN 15 6 - 20 mg/dL   Creatinine, Ser 1.27 (H) 0.44 - 1.00 mg/dL   Calcium 8.5 (L) 8.9 - 10.3 mg/dL   Total Protein 6.2 (L) 6.5 - 8.1 g/dL   Albumin 2.2 (L) 3.5 - 5.0 g/dL   AST 44 (H) 15 - 41 U/L   ALT 34 0 - 44 U/L   Alkaline Phosphatase 92 38 - 126 U/L   Total Bilirubin 0.9 0.3 - 1.2 mg/dL   GFR, Estimated 51 (L) >60 mL/min    Comment: (NOTE) Calculated using the CKD-EPI Creatinine Equation (2021)    Anion gap 12 5 - 15    Comment: Performed at [facility], [place] [address], [place], [place] [address]  CBC WITH DIFFERENTIAL     Status: Abnormal   Collection Time: 04/09/21  5:32 AM  Result Value Ref Range   WBC 13.6 (H) 4.0 - 10.5 K/uL   RBC 3.77 (L) 3.87 - 5.11 MIL/uL   Hemoglobin 10.1 (L) 12.0 - 15.0 g/dL   HCT 31.1 (L) 36.0 - 46.0 %   MCV 82.5 80.0 - 100.0 fL   MCH 26.8 26.0 - 34.0 pg   MCHC 32.5 30.0 - 36.0 g/dL   RDW 13.8 11.5 - 15.5 %   Platelets 409 (H) 150 - 400 K/uL   nRBC 0.0 0.0 - 0.2 %   Neutrophils Relative % 88 %   Neutro Abs 11.9 (H) 1.7 - 7.7 K/uL   Lymphocytes Relative 5 %   Lymphs Abs 0.7 0.7 - 4.0 K/uL   Monocytes Relative 6 %   Monocytes Absolute 0.8 0.1 - 1.0 K/uL   Eosinophils Relative 0 %   Eosinophils Absolute 0.0 0.0 - 0.5 K/uL   Basophils Relative 0 %   Basophils Absolute 0.0 0.0 - 0.1 K/uL   Immature Granulocytes 1 %   Abs Immature Granulocytes 0.13 (H) 0.00 - 0.07 K/uL    Comment: Performed at [facility], [place]  [address], [place], [place] [address]  APTT     Status: None   Collection Time: 04/09/21  5:32 AM  Result Value Ref Range   aPTT 31 24 - 36 seconds    Comment: Performed at [facility], [place] [address], [place], [place] [address]  Protime-INR     Status: Abnormal   Collection Time: 04/09/21  5:32 AM  Result Value Ref Range   Prothrombin Time 17.7 (H) 11.4 - 15.2 seconds   INR 1.5 (H) 0.8 - 1.2    Comment: (NOTE) INR goal varies based on device and disease states. Performed at [facility], [place] [address], [place], [place] [address]   Magnesium     Status: Abnormal   Collection Time: 04/09/21  5:32 AM  Result Value Ref Range   Magnesium 1.4 (L) 1.7 - 2.4 mg/dL    Comment: Performed at [facility], [place] [address] [place], [place] [address]  Troponin I (High Sensitivity)     Status: Abnormal   Collection Time: 04/09/21  5:32 AM  Result Value  Ref Range   Troponin I (High Sensitivity) 73 (H) <18 ng/L    Comment: DELTA CHECK NOTED (NOTE) Elevated high sensitivity troponin I (hsTnI) values and significant  changes across serial measurements may suggest ACS but many other  chronic and acute conditions are known to elevate hsTnI results.  Refer to the Links section for chest pain algorithms and additional  guidance. Performed at [facility], [place] [address], [place], [place] [address]   MRSA Next Gen by PCR, Nasal     Status: None   Collection Time: 04/09/21 12:27 PM   Specimen: Nasal Mucosa; Nasal Swab  Result Value Ref Range   MRSA by PCR Next Gen NOT DETECTED NOT DETECTED    Comment: (NOTE) The GeneXpert MRSA Assay (FDA approved for NASAL specimens only), is one component of a comprehensive MRSA colonization surveillance program. It is not intended to diagnose MRSA infection nor to guide or monitor treatment for MRSA infections. Test performance is not FDA approved in patients less than 80  years old. Performed at [facility], [place] [address] [place], [place] [address]     Studies/Results: CT Angio Chest Pulmonary Embolism (PE) W or WO Contrast  Result Date: 04/09/2021 CLINICAL DATA:  PE suspected, high prob. Right rib and back pain. Fever. EXAM: CT ANGIOGRAPHY CHEST WITH CONTRAST TECHNIQUE: Multidetector CT imaging of the chest was performed using the standard protocol during bolus administration of intravenous contrast. Multiplanar CT image reconstructions and MIPs were obtained to evaluate the vascular anatomy. CONTRAST:  70m OMNIPAQUE IOHEXOL 350 MG/ML SOLN COMPARISON:  None. FINDINGS: Cardiovascular: No filling defects in the pulmonary arteries to suggest pulmonary emboli. Heart is normal size. Aorta is normal caliber. Mediastinum/Nodes: No mediastinal, hilar, or axillary adenopathy. Trachea and esophagus are unremarkable. Thyroid unremarkable. Lungs/Pleura: Moderate right pleural effusion and small left pleural effusion. Dependent atelectasis in the lower lobes bilaterally. Upper Abdomen: Numerous cysts in the visualized liver and kidneys compatible with polycystic kidney disease. Musculoskeletal: Bilateral breast implants. Chest wall soft tissues are unremarkable. No acute bony abnormality. Review of the MIP images confirms the above findings. IMPRESSION: No evidence of pulmonary embolus. Moderate right pleural effusion and small left pleural effusion. Dependent atelectasis. Electronically Signed   By: KRolm BaptiseM.D.   On: 04/09/2021 00:13   UKoreaAbdomen Complete  Result Date: 04/08/2021 CLINICAL DATA:  Right upper quadrant pain for 3 days, history of polycystic kidney disease EXAM: ABDOMEN ULTRASOUND COMPLETE COMPARISON:  CT abdomen pelvis, 04/05/2021 FINDINGS: Gallbladder: No gallstones or wall thickening visualized. No sonographic Murphy sign noted by sonographer. Common bile duct: Diameter: 8 mm. Liver: Numerous liver cysts. Within normal limits in  parenchymal echogenicity. Portal vein is patent on color Doppler imaging with normal direction of blood flow towards the liver. IVC: No abnormality visualized. Pancreas: Visualized portion unremarkable. Spleen: Size and appearance within normal limits. Right Kidney: Length: 16.4 cm. The kidney is grossly enlarged by numerous cysts. Echogenicity within normal limits. No mass or hydronephrosis visualized. Left Kidney: Length: 18.1 cm. The kidney is grossly enlarged by numerous cysts. Echogenicity within normal limits. No mass or hydronephrosis visualized. Abdominal aorta: No aneurysm visualized. Other findings: Small right pleural effusion. IMPRESSION: 1. No definite acute abnormality. 2. Common bile duct measures 8 mm, mildly dilated. Patency may be further evaluated by HIDA or MRCP if indicated by clinical evidence of cholestasis. 3. Polycystic kidney and liver disease. 4. Small right pleural effusion. Electronically Signed   By: AEddie CandleM.D.   On:  04/08/2021 13:57   MR 3D Recon At Scanner  Result Date: 04/09/2021 CLINICAL DATA:  55 year old female with history of right upper quadrant abdominal pain. Possible common bile duct dilatation noted on recent abdominal ultrasound. Follow-up study. EXAM: MRI ABDOMEN WITHOUT AND WITH CONTRAST (INCLUDING MRCP) TECHNIQUE: Multiplanar multisequence MR imaging of the abdomen was performed both before and after the administration of intravenous contrast. Heavily T2-weighted images of the biliary and pancreatic ducts were obtained, and three-dimensional MRCP images were rendered by post processing. CONTRAST:  83m GADAVIST GADOBUTROL 1 MMOL/ML IV SOLN COMPARISON:  No prior abdominal MRI. Abdominal ultrasound 04/08/2021. CT the abdomen and pelvis 04/05/2021. FINDINGS: Lower chest: Small bilateral pleural effusions (right greater than left) lying dependently. Bilateral breast implants are incidentally noted. 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 Some of the larger cyst demonstrate varying degrees of complexity, including the largest lesion involving portions of segments 7 and 8 (axial image 9 of series 15 and coronal image 20 of series 5) which measures 8.4 x 7.6 x 7.3 cm and has several internal septations measuring up to 4 mm in thickness, which demonstrates some low-level areas of enhancement. One of the loculations of the cyst also contains some dependent T1 hyperintense material (axial image 15 of series 17), most compatible with a proteinaceous/hemorrhagic debris. No other definite enhancing hepatic lesions are confidently identified. Gallbladder is normal in appearance. No intrahepatic biliary ductal dilatation noted on MRCP images. However, common bile duct is dilated up to 11 mm in the porta hepatis. There are no filling defects within the common bile duct to suggest choledocholithiasis. Ductal dilatation terminates abruptly at the level of the ampulla. Pancreas: No pancreatic mass. No pancreatic ductal dilatation. No pancreatic or peripancreatic fluid collections or inflammatory changes. Spleen:  Unremarkable. Adrenals/Urinary Tract: Kidneys are essentially completely replaced with innumerable cystic lesions of varying degrees of complexity. The majority of these are T1 hypointense, T2 hyperintense and do not enhance, compatible with simple cysts. Many of these lesions demonstrate some thin internal in septations (Bosniak class 2). Some small lesions demonstrate T1 hyperintensity and mild T2 hypointensity, without definite internal enhancement, compatible with proteinaceous/hemorrhagic cysts. No definite suspicious renal lesions are confidently identified on today's examination. No hydroureteronephrosis in the visualized portions of the abdomen. Bilateral adrenal glands are normal in  appearance. Stomach/Bowel: Visualized portions are unremarkable. Vascular/Lymphatic: No aneurysm identified in the visualized abdominal vasculature. No lymphadenopathy noted in the abdomen. Other: No significant volume of ascites noted in the visualized portions of the peritoneal cavity. Musculoskeletal: No aggressive appearing osseous lesions are noted in the visualized portions of the skeleton. IMPRESSION: 1. 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 2. Dilatation of the common bile duct which measures up to 10 mm in diameter. However, there is no choledocholithiasis, evidence of obstructing mass, pancreatic ductal dilatation, or intrahepatic biliary ductal dilatation to clearly indicate obstruction. This is favored to reflect benign dilatation of the common bile duct, likely related to autosomal dominant polycystic kidney disease, as there is a known association. 3. Small bilateral pleural effusions (right greater than left). 4. Additional incidental findings, as above. Electronically Signed   By: DVinnie LangtonM.D.   On: 04/09/2021 06:31   DG Chest Portable 1 View  Result Date: 04/08/2021 CLINICAL DATA:  Shortness of breath and  fever. EXAM: PORTABLE CHEST 1 VIEW COMPARISON:  04/05/2021 FINDINGS: The cardiac silhouette, mediastinal and hilar contours are within normal limits. A small left effusion is now noted. No infiltrates or edema. No pneumothorax. The bony thorax is intact. IMPRESSION: Small left pleural effusion. Electronically Signed   By: Marijo Sanes M.D.   On: 04/08/2021 12:44   MR ABDOMEN MRCP W WO CONTAST  Result Date: 04/09/2021 CLINICAL DATA:  55 year old female with history of right upper quadrant abdominal pain. Possible common bile duct dilatation noted on recent abdominal ultrasound. Follow-up study. EXAM: MRI ABDOMEN WITHOUT AND WITH CONTRAST (INCLUDING MRCP)  TECHNIQUE: Multiplanar multisequence MR imaging of the abdomen was performed both before and after the administration of intravenous contrast. Heavily T2-weighted images of the biliary and pancreatic ducts were obtained, and three-dimensional MRCP images were rendered by post processing. CONTRAST:  63m GADAVIST GADOBUTROL 1 MMOL/ML IV SOLN COMPARISON:  No prior abdominal MRI. Abdominal ultrasound 04/08/2021. CT the abdomen and pelvis 04/05/2021. FINDINGS: Lower chest: Small bilateral pleural effusions (right greater than left) lying dependently. Bilateral breast implants are incidentally noted. 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 Some of the larger cyst demonstrate varying degrees of complexity, including the largest lesion involving portions of segments 7 and 8 (axial image 9 of series 15 and coronal image 20 of series 5) which measures 8.4 x 7.6 x 7.3 cm and has several internal septations measuring up to 4 mm in thickness, which demonstrates some low-level areas of enhancement. One of the loculations of the cyst also contains some dependent T1 hyperintense material (axial image 15 of series 17), most compatible with a proteinaceous/hemorrhagic debris. No other definite enhancing hepatic lesions are confidently identified. Gallbladder is normal in appearance. No intrahepatic biliary ductal dilatation noted on MRCP images. However, common bile duct is dilated up to 11 mm in the porta hepatis. There are no filling defects within the common bile duct to suggest choledocholithiasis. Ductal dilatation terminates abruptly at the level of the ampulla. Pancreas: No pancreatic mass. No pancreatic ductal dilatation. No pancreatic or peripancreatic fluid collections or inflammatory changes. Spleen:  Unremarkable. Adrenals/Urinary Tract:  Kidneys are essentially completely replaced with innumerable cystic lesions of varying degrees of complexity. The majority of these are T1 hypointense, T2 hyperintense and do not enhance, compatible with simple cysts. Many of these lesions demonstrate some thin internal in septations (Bosniak class 2). Some small lesions demonstrate T1 hyperintensity and mild T2 hypointensity, without definite internal enhancement, compatible with proteinaceous/hemorrhagic cysts. No definite suspicious renal lesions are confidently identified on today's examination. No hydroureteronephrosis in the visualized portions of the abdomen. Bilateral adrenal glands are normal in appearance. Stomach/Bowel: Visualized portions are unremarkable. Vascular/Lymphatic: No aneurysm identified in the visualized abdominal vasculature. No lymphadenopathy noted in the abdomen. Other: No significant volume of ascites noted in the visualized portions of the peritoneal cavity. Musculoskeletal: No aggressive appearing osseous lesions are noted in the visualized portions of the skeleton. IMPRESSION: 1. 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 2. Dilatation of the common bile duct which measures up to 10 mm in diameter. However, there is no choledocholithiasis, evidence of obstructing mass, pancreatic ductal dilatation, or intrahepatic biliary ductal dilatation to clearly indicate obstruction. This is favored to reflect benign dilatation of the common bile duct, likely related to autosomal  dominant polycystic kidney disease, as there is a known association. 3. Small bilateral pleural effusions (right greater than left). 4. Additional incidental findings, as above. Electronically Signed   By: Vinnie Langton M.D.   On: 04/09/2021 06:31    Medications: I have reviewed the patient's current medications.  Assessment: Innumerable cysts  of varying degrees in complexities in the kidney bilaterally and liver compatible with autosomal dominant polycystic kidney disease  Some of the larger cysts demonstrate proteinaceous/hemorrhagic debris and measure 8.4 x 7.6 x 7.3 cm with several internal septations up to 4 mm in thickness  Mild leukocytosis, WBC 13.6 Mild anemia, hemoglobin 10.1, relatively stable Normal T bili of 0.9, AST 44, ALT 34, ALP 92-dilated CBD of 10 mm without choledocholithiasis, no indication for ERCP  On empiric treatment with IV Zosyn, IV fluids Currently on gabapentin 300 mg twice a day, morphine 2 mg every 3 hours for moderate pain and 4 mg every 3 hours for severe pain along with tramadol 100 mg every 6 hours for severe pain   Plan: Recommend pain management consult, patient will likely require PCA pump for adequate pain control. Recommend IR consultation for possible drainage of large hepatic cysts.  Ronnette Juniper, MD 04/10/2021, 9:32 AM

## 2021-04-10 NOTE — Progress Notes (Signed)
When the pt has to be repositioned in bed. She has a difficult time turning and begins to take shallow breaths, she  grabs the side of her rib cage and begins to hypoventilate which causes her 02 to decrease and her HR to increase, once she is settled her breathing and HR decreases and she appears to be in less distress.

## 2021-04-10 NOTE — Consult Note (Signed)
Chief Complaint: Patient was seen in consultation today for hepatic cyst aspiration/drain placement Chief Complaint  Patient presents with   Fever   Pain   at the request of Dr Jonelle Sports  Referring Physician(s): Dr Venia Minks  Supervising Physician: Jacqulynn Cadet  Patient Status: [facility] - In-pt  History of Present Illness: Judy Manning is a 55 y.o. female   RUQ pain Severe x few days' Cant eat; move much Known polycystic kidney disease Imaging revealing hepatic cysts T max yesterday 100.5 Wbc 12.6  MR 04/08/21: IMPRESSION: 1. 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 2. Dilatation of the common bile duct which measures up to 10 mm in diameter. However, there is no choledocholithiasis, evidence of obstructing mass, pancreatic ductal dilatation, or intrahepatic biliary ductal dilatation to clearly indicate obstruction. This is favored to reflect benign dilatation of the common bile duct, likely related to autosomal dominant polycystic kidney disease, as there is a known association.   Request for hepatic cyst aspiration/drain placement Procedure approved with Dr Laurence Ferrari   Past Medical History:  Diagnosis Date   COVID 12/2020   Polycystic kidney disease     Past Surgical History:  Procedure Laterality Date   AUGMENTATION MAMMAPLASTY      Allergies: Imitrex [sumatriptan], Ibuprofen, Nitrofurantoin, and Oseltamivir  Medications: Prior to Admission medications   Medication Sig Start Date End Date Taking? Authorizing Provider  Biotin 10000 MCG TABS Take by mouth.   Yes [provider]  cetirizine (ZYRTEC) 10 MG tablet Take 10 mg by mouth daily as needed for allergies (alternates with claritin).   Yes [provider]  chlorthalidone (HYGROTON) 25 MG tablet TAKE 1 TABLET BY MOUTH  DAILY 03/27/20  Yes Hilts, Michael, MD   Cholecalciferol (VITAMIN D-3) 5000 units TABS Take by mouth.   Yes [provider]  Dapsone 5 % topical gel Apply 1 application topically 2 (two) times daily. 09/18/20  Yes Hilts, Legrand Como, MD  gabapentin (NEURONTIN) 300 MG capsule TAKE 1 CAPSULE BY MOUTH  TWICE DAILY 12/29/20  Yes Hilts, Michael, MD  JYNARQUE 90 & 30 MG TBPK  03/14/18  Yes [provider]  Multiple Vitamin (MULTIVITAMIN) tablet Take 1 tablet by mouth daily.   Yes [provider]  ondansetron (ZOFRAN ODT) 4 MG disintegrating tablet Take 1 tablet (4 mg total) by mouth every 8 (eight) hours as needed for nausea or vomiting. 04/05/21  Yes Rayna Sexton, PA-C  zolpidem (AMBIEN) 10 MG tablet TAKE 1/2 TO 1 TABLET BY  MOUTH AT BEDTIME AS NEEDED  FOR SLEEP 03/23/21  Yes Hilts, Michael, MD  tretinoin (RETIN-A) 0.025 % cream Apply topically at bedtime. 03/23/21   Hilts, Legrand Como, MD     Family History  Problem Relation Age of Onset   Rheum arthritis Mother    Heart disease Mother        Pacemaker   COPD Mother        Smoker   Skin cancer Mother    Cancer Mother    Stroke Father    Polycystic kidney disease Father    Arthritis Brother    Alzheimer's disease Maternal Grandmother 21   Diabetes Paternal Grandfather    Skin cancer Paternal Grandfather    Cancer Paternal Grandfather    Breast cancer Neg Hx    Colon cancer Neg Hx     Social History   Socioeconomic History   Marital status: Married  Spouse name: Not on file   Number of children: Not on file   Years of education: Not on file   Highest education level: Not on file  Occupational History   Not on file  Tobacco Use   Smoking status: Never   Smokeless tobacco: Never  Substance and Sexual Activity   Alcohol use: Not Currently    Comment: rarely ever   Drug use: Never   Sexual activity: Not on file  Other Topics Concern   Not on file  Social History Narrative   Not on file   Social Determinants of Health   Financial Resource  Strain: Not on file  Food Insecurity: Not on file  Transportation Needs: Not on file  Physical Activity: Not on file  Stress: Not on file  Social Connections: Not on file    Review of Systems: A 12 point ROS discussed and pertinent positives are indicated in the HPI above.  All other systems are negative.  Review of Systems  Constitutional:  Positive for activity change, appetite change and fever.  Respiratory:  Positive for shortness of breath. Negative for cough.   Cardiovascular:  Negative for chest pain.  Gastrointestinal:  Positive for abdominal distention and abdominal pain.  Psychiatric/Behavioral:  Negative for behavioral problems and confusion.    Vital Signs: BP 113/88 (BP Location: Left Arm)   Pulse (!) 118   Temp 99.1 F (37.3 C) (Oral)   Resp 20   Ht 5' (1.524 m)   Wt 118 lb (53.5 kg)   LMP 03/16/2021 Comment: period has lasted for 2 weeks  SpO2 94%   BMI 23.05 kg/m   Physical Exam HENT:     Mouth/Throat:     Mouth: Mucous membranes are moist.  Cardiovascular:     Rate and Rhythm: Normal rate and regular rhythm.  Pulmonary:     Effort: Pulmonary effort is normal.     Breath sounds: Normal breath sounds.  Abdominal:     Tenderness: There is abdominal tenderness.  Musculoskeletal:        General: Normal range of motion.  Skin:    General: Skin is warm.  Neurological:     Mental Status: She is alert and oriented to person, place, and time.  Psychiatric:        Behavior: Behavior normal.    Imaging: CT Angio Chest Pulmonary Embolism (PE) W or WO Contrast  Result Date: 04/09/2021 CLINICAL DATA:  PE suspected, high prob. Right rib and back pain. Fever. EXAM: CT ANGIOGRAPHY CHEST WITH CONTRAST TECHNIQUE: Multidetector CT imaging of the chest was performed using the standard protocol during bolus administration of intravenous contrast. Multiplanar CT image reconstructions and MIPs were obtained to evaluate the vascular anatomy. CONTRAST:  42m OMNIPAQUE  IOHEXOL 350 MG/ML SOLN COMPARISON:  None. FINDINGS: Cardiovascular: No filling defects in the pulmonary arteries to suggest pulmonary emboli. Heart is normal size. Aorta is normal caliber. Mediastinum/Nodes: No mediastinal, hilar, or axillary adenopathy. Trachea and esophagus are unremarkable. Thyroid unremarkable. Lungs/Pleura: Moderate right pleural effusion and small left pleural effusion. Dependent atelectasis in the lower lobes bilaterally. Upper Abdomen: Numerous cysts in the visualized liver and kidneys compatible with polycystic kidney disease. Musculoskeletal: Bilateral breast implants. Chest wall soft tissues are unremarkable. No acute bony abnormality. Review of the MIP images confirms the above findings. IMPRESSION: No evidence of pulmonary embolus. Moderate right pleural effusion and small left pleural effusion. Dependent atelectasis. Electronically Signed   By: KRolm BaptiseM.D.   On: 04/09/2021 00:13  US Abdomen Complete  Result Date: 04/08/2021 CLINICAL DATA:  Right upper quadrant pain for 3 days, history of polycystic kidney disease EXAM: ABDOMEN ULTRASOUND COMPLETE COMPARISON:  CT abdomen pelvis, 04/05/2021 FINDINGS: Gallbladder: No gallstones or wall thickening visualized. No sonographic Murphy sign noted by sonographer. Common bile duct: Diameter: 8 mm. Liver: Numerous liver cysts. Within normal limits in parenchymal echogenicity. Portal vein is patent on color Doppler imaging with normal direction of blood flow towards the liver. IVC: No abnormality visualized. Pancreas: Visualized portion unremarkable. Spleen: Size and appearance within normal limits. Right Kidney: Length: 16.4 cm. The kidney is grossly enlarged by numerous cysts. Echogenicity within normal limits. No mass or hydronephrosis visualized. Left Kidney: Length: 18.1 cm. The kidney is grossly enlarged by numerous cysts. Echogenicity within normal limits. No mass or hydronephrosis visualized. Abdominal aorta: No aneurysm  visualized. Other findings: Small right pleural effusion. IMPRESSION: 1. No definite acute abnormality. 2. Common bile duct measures 8 mm, mildly dilated. Patency may be further evaluated by HIDA or MRCP if indicated by clinical evidence of cholestasis. 3. Polycystic kidney and liver disease. 4. Small right pleural effusion. Electronically Signed   By: Eddie Candle M.D.   On: 04/08/2021 13:57   CT ABDOMEN PELVIS W CONTRAST  Result Date: 04/05/2021 CLINICAL DATA:  Epigastric pain, fever of unknown origin EXAM: CT ABDOMEN AND PELVIS WITH CONTRAST TECHNIQUE: Multidetector CT imaging of the abdomen and pelvis was performed using the standard protocol following bolus administration of intravenous contrast. CONTRAST:  26m OMNIPAQUE IOHEXOL 350 MG/ML SOLN COMPARISON:  None. FINDINGS: Lower chest: Small right pleural effusion. Right base atelectasis. Heart is normal size. Hepatobiliary: Innumerable hepatic cysts. Gallbladder unremarkable. Common bile duct mildly dilated at 9 mm. Pancreas: No focal abnormality or ductal dilatation. Spleen: No focal abnormality.  Normal size. Adrenals/Urinary Tract: Innumerable bilateral renal cysts. No hydronephrosis. Adrenal glands unremarkable as is urinary bladder. Stomach/Bowel: Normal appendix. Stomach, large and small bowel grossly unremarkable. Vascular/Lymphatic: No evidence of aneurysm or adenopathy. Reproductive: Uterus and adnexa unremarkable.  No mass. Other: Small amount of free fluid in the pelvis.  No free air. Musculoskeletal: No acute bony abnormality. IMPRESSION: Innumerable hepatic and bilateral renal cysts most compatible with polycystic kidney disease. Small right pleural effusion. Small amount of free fluid in the pelvis, likely physiologic free fluid. No acute findings in the abdomen or pelvis. Electronically Signed   By: KRolm BaptiseM.D.   On: 04/05/2021 20:32   MR 3D Recon At Scanner  Result Date: 04/09/2021 CLINICAL DATA:  55year old female with history of  right upper quadrant abdominal pain. Possible common bile duct dilatation noted on recent abdominal ultrasound. Follow-up study. EXAM: MRI ABDOMEN WITHOUT AND WITH CONTRAST (INCLUDING MRCP) TECHNIQUE: Multiplanar multisequence MR imaging of the abdomen was performed both before and after the administration of intravenous contrast. Heavily T2-weighted images of the biliary and pancreatic ducts were obtained, and three-dimensional MRCP images were rendered by post processing. CONTRAST:  552mGADAVIST GADOBUTROL 1 MMOL/ML IV SOLN COMPARISON:  No prior abdominal MRI. Abdominal ultrasound 04/08/2021. CT the abdomen and pelvis 04/05/2021. FINDINGS: Lower chest: Small bilateral pleural effusions (right greater than left) lying dependently. Bilateral breast implants are incidentally noted. 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 Some of the larger cyst demonstrate varying degrees of complexity, including the largest lesion involving portions of segments 7 and 8 (axial image 9 of series 15  and coronal image 20 of series 5) which measures 8.4 x 7.6 x 7.3 cm and has several internal septations measuring up to 4 mm in thickness, which demonstrates some low-level areas of enhancement. One of the loculations of the cyst also contains some dependent T1 hyperintense material (axial image 15 of series 17), most compatible with a proteinaceous/hemorrhagic debris. No other definite enhancing hepatic lesions are confidently identified. Gallbladder is normal in appearance. No intrahepatic biliary ductal dilatation noted on MRCP images. However, common bile duct is dilated up to 11 mm in the porta hepatis. There are no filling defects within the common bile duct to suggest choledocholithiasis. Ductal dilatation terminates abruptly at the level of the ampulla.  Pancreas: No pancreatic mass. No pancreatic ductal dilatation. No pancreatic or peripancreatic fluid collections or inflammatory changes. Spleen:  Unremarkable. Adrenals/Urinary Tract: Kidneys are essentially completely replaced with innumerable cystic lesions of varying degrees of complexity. The majority of these are T1 hypointense, T2 hyperintense and do not enhance, compatible with simple cysts. Many of these lesions demonstrate some thin internal in septations (Bosniak class 2). Some small lesions demonstrate T1 hyperintensity and mild T2 hypointensity, without definite internal enhancement, compatible with proteinaceous/hemorrhagic cysts. No definite suspicious renal lesions are confidently identified on today's examination. No hydroureteronephrosis in the visualized portions of the abdomen. Bilateral adrenal glands are normal in appearance. Stomach/Bowel: Visualized portions are unremarkable. Vascular/Lymphatic: No aneurysm identified in the visualized abdominal vasculature. No lymphadenopathy noted in the abdomen. Other: No significant volume of ascites noted in the visualized portions of the peritoneal cavity. Musculoskeletal: No aggressive appearing osseous lesions are noted in the visualized portions of the skeleton. IMPRESSION: 1. 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 2. Dilatation of the common bile duct which measures up to 10 mm in diameter. However, there is no choledocholithiasis, evidence of obstructing mass, pancreatic ductal dilatation, or intrahepatic biliary ductal dilatation to clearly indicate obstruction. This is favored to reflect benign dilatation of the common bile duct, likely related to autosomal dominant polycystic kidney disease, as there is a known association. 3. Small bilateral pleural effusions (right greater than left). 4. Additional incidental findings, as  above. Electronically Signed   By: Vinnie Langton M.D.   On: 04/09/2021 06:31   DG Chest Portable 1 View  Result Date: 04/08/2021 CLINICAL DATA:  Shortness of breath and fever. EXAM: PORTABLE CHEST 1 VIEW COMPARISON:  04/05/2021 FINDINGS: The cardiac silhouette, mediastinal and hilar contours are within normal limits. A small left effusion is now noted. No infiltrates or edema. No pneumothorax. The bony thorax is intact. IMPRESSION: Small left pleural effusion. Electronically Signed   By: Marijo Sanes M.D.   On: 04/08/2021 12:44   DG Chest Portable 1 View  Result Date: 04/05/2021 CLINICAL DATA:  Fever of unknown origin EXAM: PORTABLE CHEST 1 VIEW COMPARISON:  None. FINDINGS: The heart size and mediastinal contours are within normal limits. Both lungs are clear. The visualized skeletal structures are unremarkable. IMPRESSION: No active disease. Electronically Signed   By: Donavan Foil M.D.   On: 04/05/2021 19:01   MR ABDOMEN MRCP W WO CONTAST  Result Date: 04/09/2021 CLINICAL DATA:  55 year old female with history of right upper quadrant abdominal pain. Possible common bile duct dilatation noted on recent abdominal ultrasound. Follow-up study. EXAM: MRI ABDOMEN WITHOUT AND WITH CONTRAST (INCLUDING MRCP) TECHNIQUE: Multiplanar multisequence MR imaging of the abdomen was performed both before and after the  administration of intravenous contrast. Heavily T2-weighted images of the biliary and pancreatic ducts were obtained, and three-dimensional MRCP images were rendered by post processing. CONTRAST:  72m GADAVIST GADOBUTROL 1 MMOL/ML IV SOLN COMPARISON:  No prior abdominal MRI. Abdominal ultrasound 04/08/2021. CT the abdomen and pelvis 04/05/2021. FINDINGS: Lower chest: Small bilateral pleural effusions (right greater than left) lying dependently. Bilateral breast implants are incidentally noted. 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 Some of the larger cyst demonstrate varying degrees of complexity, including the largest lesion involving portions of segments 7 and 8 (axial image 9 of series 15 and coronal image 20 of series 5) which measures 8.4 x 7.6 x 7.3 cm and has several internal septations measuring up to 4 mm in thickness, which demonstrates some low-level areas of enhancement. One of the loculations of the cyst also contains some dependent T1 hyperintense material (axial image 15 of series 17), most compatible with a proteinaceous/hemorrhagic debris. No other definite enhancing hepatic lesions are confidently identified. Gallbladder is normal in appearance. No intrahepatic biliary ductal dilatation noted on MRCP images. However, common bile duct is dilated up to 11 mm in the porta hepatis. There are no filling defects within the common bile duct to suggest choledocholithiasis. Ductal dilatation terminates abruptly at the level of the ampulla. Pancreas: No pancreatic mass. No pancreatic ductal dilatation. No pancreatic or peripancreatic fluid collections or inflammatory changes. Spleen:  Unremarkable. Adrenals/Urinary Tract: Kidneys are essentially completely replaced with innumerable cystic lesions of varying degrees of complexity. The majority of these are T1 hypointense, T2 hyperintense and do not enhance, compatible with simple cysts. Many of these lesions demonstrate some thin internal in septations (Bosniak class 2). Some small lesions demonstrate T1 hyperintensity and mild T2 hypointensity, without definite internal enhancement, compatible with proteinaceous/hemorrhagic cysts. No definite suspicious renal lesions are confidently identified on today's examination. No hydroureteronephrosis in the visualized portions of the abdomen. Bilateral adrenal glands are normal in appearance. Stomach/Bowel: Visualized portions are  unremarkable. Vascular/Lymphatic: No aneurysm identified in the visualized abdominal vasculature. No lymphadenopathy noted in the abdomen. Other: No significant volume of ascites noted in the visualized portions of the peritoneal cavity. Musculoskeletal: No aggressive appearing osseous lesions are noted in the visualized portions of the skeleton. IMPRESSION: 1. 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 2. Dilatation of the common bile duct which measures up to 10 mm in diameter. However, there is no choledocholithiasis, evidence of obstructing mass, pancreatic ductal dilatation, or intrahepatic biliary ductal dilatation to clearly indicate obstruction. This is favored to reflect benign dilatation of the common bile duct, likely related to autosomal dominant polycystic kidney disease, as there is a known association. 3. Small bilateral pleural effusions (right greater than left). 4. Additional incidental findings, as above. Electronically Signed   By: DVinnie LangtonM.D.   On: 04/09/2021 06:31    Labs:  CBC: Recent Labs    04/05/21 1906 04/08/21 1211 04/08/21 1214 04/09/21 0532 04/10/21 0925  WBC 10.5 13.6*  --  13.6* 12.6*  HGB 11.3* 10.6* 11.2* 10.1* 9.5*  HCT 33.9* 32.7* 33.0* 31.1* 28.7*  PLT 295 390  --  409* 415*    COAGS: Recent Labs    04/09/21 0532  INR 1.5*  APTT 31    BMP: Recent Labs    04/05/21  1906 04/08/21 1211 04/08/21 1214 04/09/21 0532 04/10/21 0925  NA 134* 134* 134* 139 136  K 3.1* 3.1* 3.1* 4.3 3.8  CL 93* 94*  --  102 100  CO2 27 28  --  25 28  GLUCOSE 101* 102*  --  82 173*  BUN 22* 13  --  15 11  CALCIUM 9.2 9.0  --  8.5* 8.1*  CREATININE 1.30* 1.28*  --  1.27* 1.26*  GFRNONAA 49* 50*  --  51* 51*    LIVER FUNCTION TESTS: Recent Labs    04/05/21 1906 04/08/21 1211 04/09/21 0532 04/10/21 0925  BILITOT 0.7 0.8 0.9 0.6  AST  18 45* 44* 73*  ALT 14 32 34 56*  ALKPHOS 105 106 92 98  PROT 7.3 7.6 6.2* 6.0*  ALBUMIN 3.6 3.5 2.2* 2.2*    TUMOR MARKERS: No results for input(s): AFPTM, CEA, CA199, CHROMGRNA in the last 8760 hours.  Assessment and Plan:  Hx Polycystic kidney disease Abd pain for days Worsening MRI revealing large hepatic cysts Now scheduled for largest hepatic cyst aspiration vs drain placement Risks and benefits discussed with the patient including bleeding, infection, damage to adjacent structures, and sepsis.  All of the patient's questions were answered, patient is agreeable to proceed. Consent signed and in chart.   Thank you for this interesting consult.  I greatly enjoyed meeting Ernestene Nucci and look forward to participating in their care.  A copy of this report was sent to the requesting provider on this date.  Electronically Signed: Lavonia Drafts, PA-C 04/10/2021, 12:43 PM   I spent a total of 40 Minutes    in face to face in clinical consultation, greater than 50% of which was counseling/coordinating care for hepatic cyst aspiration/drain

## 2021-04-10 NOTE — Procedures (Signed)
Interventional Radiology Procedure Note  Procedure:  US guided aspiration of right hepatic dome cyst.  ~100cc of thin yellow fluid, minimal debris. Sample sent .  Complications: None  Recommendations:  - Routine wound care - Do not submerge for 7 days - Follow up culture. - ok to advance diet per primary order  Signed,  Dulcy Fanny. Earleen Newport, DO

## 2021-04-10 NOTE — Progress Notes (Signed)
PROGRESS NOTE    Judy Manning  [id number] DOB: 01-Feb-1966 DOA: 04/08/2021 PCP: Eunice Blase, MD   Chief Complaint  Patient presents with   Fever   Pain    Brief Narrative:  55 year old lady with prior history of polycystic kidney disease, hypertension, migraine headaches presents with right flank pain right-sided abdominal pain started about 2 weeks ago associated with fevers and some nausea and vomiting.  She was recently seen in urgent care on 03/30/2021 was given a prescription for azithromycin and sent home.  Patient reports her symptoms did not improve.  She underwent an MRI of the abdomen which showed mildly dilated common bile duct of 8 mm remains febrile with leukocytosis.    Assessment & Plan:   Principal Problem:   Right upper quadrant pain Active Problems:   Polycystic kidney disease   SIRS (systemic inflammatory response syndrome) (HCC)   Common bile duct dilatation   History of migraine headaches   Hypokalemia, inadequate intake   Hyponatremia   Right upper quadrant pain/ right flank pain.  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 Some of the larger cyst demonstrate varying degrees of complexity, including the largest lesion involving portions of segments 7 and 8 (axial image 9 of series 15 and coronal image 20 of series 5) which measures 8.4 x 7.6 x 7.3 cm and has several internal septations measuring up to 4 mm in thickness, which demonstrates some low-level areas of enhancement. One of the loculations of the cyst also contains some dependent T1 hyperintense material  most compatible with a proteinaceous/hemorrhagic debris. CBD dilated at 11 mm at porta hepatis.  PE ruled out.  Empirically on IV antibiotics, pain control, IV fluids.  GI Consulted to see if this is cholangitis vs from the liver cysts. . She is  febrile, has leukocytosis, elevated pro calcitonin. Near normal liver enzymes.  GI recommends IR consultation for possible aspiration of the large cysts in the liver.  Blood cultures are pending and negative.  UA is not sig for UTI.     SIRS: Improving . Monitor blood cultures    PKD:  Pt reports she has been off Jynarque/ Tolvapton since 10 days, since the onset of fevers. She follows up with Dr Justin Mend outpatient.  Nephrology consulted.    Hypomagnesemia and hypokalemia Replaced.    Elevated troponin  Suspect from demand ischemia from SIRS.  Repeat EKG shows sinus tachycardia without any ischemic changes. She denies any chest pain.  Will get echocardiogram for further eval.    Stage 2 CKD Renal parameters have been stable at 1.2    Normocytic anemia:  ? Blood loss in to the cytic lesions, baseline hemoglobin between 11 to 12. Currently at 9.5 Continue to monitor.      DVT prophylaxis: (lovenox. ) Code Status: (Full Code) Family Communication: None at bedside.  Disposition:   Status is: Inpatient  Remains inpatient appropriate because:Ongoing diagnostic testing needed not appropriate for outpatient work up, Unsafe d/c plan, and IV treatments appropriate due to intensity of illness or inability to take PO  Dispo: The patient is from: Home              Anticipated d/c is to:  pending.              Patient currently is not medically stable to d/c.   Difficult to place patient No  Consultants:  Gastroenterology Nephrology.  IR  Procedures: none.   Antimicrobials:   Antibiotics Given (last 72 hours)     Date/Time Action Medication Dose Rate   04/08/21 1750 New Bag/Given   cefTRIAXone (ROCEPHIN) 1 g in sodium chloride 0.9 % 100 mL IVPB 1 g 200 mL/hr   04/09/21 0030 New Bag/Given   piperacillin-tazobactam (ZOSYN) IVPB 3.375 g 3.375 g 12.5 mL/hr   04/09/21 0957 New Bag/Given   piperacillin-tazobactam (ZOSYN) IVPB 3.375 g 3.375 g 12.5 mL/hr    04/09/21 1705 New Bag/Given   piperacillin-tazobactam (ZOSYN) IVPB 3.375 g 3.375 g 12.5 mL/hr   04/09/21 2347 New Bag/Given   piperacillin-tazobactam (ZOSYN) IVPB 3.375 g 3.375 g 12.5 mL/hr   04/10/21 0816 New Bag/Given   piperacillin-tazobactam (ZOSYN) IVPB 3.375 g 3.375 g 12.5 mL/hr         Subjective: Persistent abdominal pain, associated with nausea.   Objective: Vitals:   04/10/21 0613 04/10/21 0728 04/10/21 1005 04/10/21 1146  BP: 125/66 (!) 127/52 113/88   Pulse: (!) 123 98 (!) 118   Resp:  '20 20 20  '$ Temp:  (!) 100.5 F (38.1 C) 99.1 F (37.3 C)   TempSrc: Oral Oral Oral   SpO2: (!) 79% 99% (!) 85% 94%  Weight:      Height:       No intake or output data in the 24 hours ending 04/10/21 1257  Filed Weights   04/08/21 1120  Weight: 53.5 kg    Examination:  Physical Exam General: Alert and oriented x 3, in moderate distress from  abd pain.  Cardiovascular: S1 S2 clear, tachycardic.  No pedal edema b/l Respiratory: CTAB, no wheezing, rales or rhonchi Gastrointestinal: abdomen is soft very tender in the RUQ. Bowel movements minimal.  Ext: no pedal edema bilaterally Neuro: no new deficits Skin: No rashes Psych: anxious.     Data Reviewed: I have personally reviewed following labs and imaging studies  CBC: Recent Labs  Lab 04/05/21 1906 04/08/21 1211 04/08/21 1214 04/09/21 0532 04/10/21 0925  WBC 10.5 13.6*  --  13.6* 12.6*  NEUTROABS 8.7* 11.7*  --  11.9* 11.2*  HGB 11.3* 10.6* 11.2* 10.1* 9.5*  HCT 33.9* 32.7* 33.0* 31.1* 28.7*  MCV 80.3 81.8  --  82.5 82.2  PLT 295 390  --  409* 415*     Basic Metabolic Panel: Recent Labs  Lab 04/05/21 1906 04/08/21 1211 04/08/21 1214 04/09/21 0532 04/10/21 0925  NA 134* 134* 134* 139 136  K 3.1* 3.1* 3.1* 4.3 3.8  CL 93* 94*  --  102 100  CO2 27 28  --  25 28  GLUCOSE 101* 102*  --  82 173*  BUN 22* 13  --  15 11  CREATININE 1.30* 1.28*  --  1.27* 1.26*  CALCIUM 9.2 9.0  --  8.5* 8.1*  MG  --    --   --  1.4*  --      GFR: Estimated Creatinine Clearance: 37.5 mL/min (A) (by C-G formula based on SCr of 1.26 mg/dL (H)).  Liver Function Tests: Recent Labs  Lab 04/05/21 1906 04/08/21 1211 04/09/21 0532 04/10/21 0925  AST 18 45* 44* 73*  ALT 14 32 34 56*  ALKPHOS 105 106 92 98  BILITOT 0.7 0.8 0.9 0.6  PROT 7.3 7.6 6.2* 6.0*  ALBUMIN 3.6 3.5 2.2* 2.2*     CBG: No results for input(s): GLUCAP in the last 168 hours.   Recent Results (from the past 240 hour(s))  Culture, blood (routine x 2)     Status: None (Preliminary result)   Collection Time: 04/05/21  6:40 PM   Specimen: BLOOD  Result Value Ref Range Status   Specimen Description   Final    BLOOD RIGHT ANTECUBITAL Performed at [facility], [address] [place], [place] [address]    Special Requests   Final    Blood Culture adequate volume BOTTLES DRAWN AEROBIC AND ANAEROBIC Performed at [facility], [address] [place], [place] [address]    Culture   Final    NO GROWTH 4 DAYS Performed at [facility], [place] [address] [place], [place] [address]    Report Status PENDING  Incomplete  Culture, blood (routine x 2)     Status: None (Preliminary result)   Collection Time: 04/05/21  6:55 PM   Specimen: BLOOD  Result Value Ref Range Status   Specimen Description   Final    BLOOD LEFT ANTECUBITAL Performed at [facility], [address] [place], [place] [address]    Special Requests   Final    Blood Culture adequate volume BOTTLES DRAWN AEROBIC AND ANAEROBIC Performed at [facility], [address] [place], [place] [address]    Culture   Final    NO GROWTH 4 DAYS Performed at [facility], [place] [address], [place], [place] [address]    Report Status PENDING  Incomplete  Blood culture (routine x 2)     Status: None (Preliminary result)   Collection Time: 04/08/21 12:00 PM   Specimen: BLOOD RIGHT FOREARM   Result Value Ref Range Status   Specimen Description   Final    BLOOD RIGHT FOREARM Performed at [facility], [address], [place], [place] [address]    Special Requests   Final    BOTTLES DRAWN AEROBIC AND ANAEROBIC Blood Culture adequate volume   Culture   Final    NO GROWTH < 24 HOURS Performed at [facility], [place] [address], [place], [place] [address]    Report Status PENDING  Incomplete  Blood culture (routine x 2)     Status: None (Preliminary result)   Collection Time: 04/08/21 12:09 PM   Specimen: BLOOD  Result Value Ref Range Status   Specimen Description BLOOD LEFT ANTECUBITAL  Final   Special Requests   Final    BOTTLES DRAWN AEROBIC AND ANAEROBIC Blood Culture adequate volume   Culture   Final    NO GROWTH < 24 HOURS Performed at [facility], [place] [address], [place], [place] [address]    Report Status PENDING  Incomplete  Resp Panel by RT-PCR (Flu A&B, Covid) Nasopharyngeal Swab     Status: None   Collection Time: 04/08/21  5:38 PM   Specimen: Nasopharyngeal Swab; Nasopharyngeal(NP) swabs in vial transport medium  Result Value Ref Range Status   SARS Coronavirus 2 by RT PCR NEGATIVE NEGATIVE Final    Comment: (NOTE) SARS-CoV-2 target nucleic acids are NOT DETECTED.  The SARS-CoV-2 RNA is generally detectable in upper respiratory specimens during the acute phase of infection. The lowest concentration of SARS-CoV-2 viral copies this assay can detect is 138 copies/mL. A negative result does not preclude SARS-Cov-2 infection and should not be used as the sole basis for treatment or other patient management decisions. A negative result may occur with  improper specimen collection/handling, submission of specimen other than nasopharyngeal swab, presence of viral mutation(s) within the areas targeted by  this assay, and inadequate number of viral copies(<138 copies/mL). A negative result must be combined with clinical observations,  patient history, and epidemiological information. The expected result is Negative.  Fact Sheet for Patients:  EntrepreneurPulse.com.au  Fact Sheet for Healthcare Providers:  IncredibleEmployment.be  This test is no t yet approved or cleared by the [place] FDA and  has been authorized for detection and/or diagnosis of SARS-CoV-2 by FDA under an Emergency Use Authorization (EUA). This EUA will remain  in effect (meaning this test can be used) for the duration of the COVID-19 declaration under Section 564(b)(1) of the Act, 21 U.S.C.section 360bbb-3(b)(1), unless the authorization is terminated  or revoked sooner.       Influenza A by PCR NEGATIVE NEGATIVE Final   Influenza B by PCR NEGATIVE NEGATIVE Final    Comment: (NOTE) The Xpert Xpress SARS-CoV-2/FLU/RSV plus assay is intended as an aid in the diagnosis of influenza from Nasopharyngeal swab specimens and should not be used as a sole basis for treatment. Nasal washings and aspirates are unacceptable for Xpert Xpress SARS-CoV-2/FLU/RSV testing.  Fact Sheet for Patients: EntrepreneurPulse.com.au  Fact Sheet for Healthcare Providers: IncredibleEmployment.be  This test is not yet approved or cleared by the [place] FDA and has been authorized for detection and/or diagnosis of SARS-CoV-2 by FDA under an Emergency Use Authorization (EUA). This EUA will remain in effect (meaning this test can be used) for the duration of the COVID-19 declaration under Section 564(b)(1) of the Act, 21 U.S.C. section 360bbb-3(b)(1), unless the authorization is terminated or revoked.  Performed at [facility], [address], [place], [place] [address]   MRSA Next Gen by PCR, Nasal     Status: None   Collection Time: 04/09/21 12:27 PM   Specimen: Nasal Mucosa; Nasal Swab  Result Value Ref Range Status   MRSA by PCR Next Gen NOT DETECTED NOT  DETECTED Final    Comment: (NOTE) The GeneXpert MRSA Assay (FDA approved for NASAL specimens only), is one component of a comprehensive MRSA colonization surveillance program. It is not intended to diagnose MRSA infection nor to guide or monitor treatment for MRSA infections. Test performance is not FDA approved in patients less than 15 years old. Performed at [facility], [place] [address] [place], [place] [address]           Radiology Studies: CT Angio Chest Pulmonary Embolism (PE) W or WO Contrast  Result Date: 04/09/2021 CLINICAL DATA:  PE suspected, high prob. Right rib and back pain. Fever. EXAM: CT ANGIOGRAPHY CHEST WITH CONTRAST TECHNIQUE: Multidetector CT imaging of the chest was performed using the standard protocol during bolus administration of intravenous contrast. Multiplanar CT image reconstructions and MIPs were obtained to evaluate the vascular anatomy. CONTRAST:  38m OMNIPAQUE IOHEXOL 350 MG/ML SOLN COMPARISON:  None. FINDINGS: Cardiovascular: No filling defects in the pulmonary arteries to suggest pulmonary emboli. Heart is normal size. Aorta is normal caliber. Mediastinum/Nodes: No mediastinal, hilar, or axillary adenopathy. Trachea and esophagus are unremarkable. Thyroid unremarkable. Lungs/Pleura: Moderate right pleural effusion and small left pleural effusion. Dependent atelectasis in the lower lobes bilaterally. Upper Abdomen: Numerous cysts in the visualized liver and kidneys compatible with polycystic kidney disease. Musculoskeletal: Bilateral breast implants. Chest wall soft tissues are unremarkable. No acute bony abnormality. Review of the MIP images confirms the above findings. IMPRESSION: No evidence of pulmonary embolus. Moderate right pleural effusion and small left pleural effusion. Dependent atelectasis. Electronically Signed   By: KRolm BaptiseM.D.  On: 04/09/2021 00:13   US Abdomen Complete  Result Date: 04/08/2021 CLINICAL DATA:  Right  upper quadrant pain for 3 days, history of polycystic kidney disease EXAM: ABDOMEN ULTRASOUND COMPLETE COMPARISON:  CT abdomen pelvis, 04/05/2021 FINDINGS: Gallbladder: No gallstones or wall thickening visualized. No sonographic Murphy sign noted by sonographer. Common bile duct: Diameter: 8 mm. Liver: Numerous liver cysts. Within normal limits in parenchymal echogenicity. Portal vein is patent on color Doppler imaging with normal direction of blood flow towards the liver. IVC: No abnormality visualized. Pancreas: Visualized portion unremarkable. Spleen: Size and appearance within normal limits. Right Kidney: Length: 16.4 cm. The kidney is grossly enlarged by numerous cysts. Echogenicity within normal limits. No mass or hydronephrosis visualized. Left Kidney: Length: 18.1 cm. The kidney is grossly enlarged by numerous cysts. Echogenicity within normal limits. No mass or hydronephrosis visualized. Abdominal aorta: No aneurysm visualized. Other findings: Small right pleural effusion. IMPRESSION: 1. No definite acute abnormality. 2. Common bile duct measures 8 mm, mildly dilated. Patency may be further evaluated by HIDA or MRCP if indicated by clinical evidence of cholestasis. 3. Polycystic kidney and liver disease. 4. Small right pleural effusion. Electronically Signed   By: Eddie Candle M.D.   On: 04/08/2021 13:57   MR 3D Recon At Scanner  Result Date: 04/09/2021 CLINICAL DATA:  55 year old female with history of right upper quadrant abdominal pain. Possible common bile duct dilatation noted on recent abdominal ultrasound. Follow-up study. EXAM: MRI ABDOMEN WITHOUT AND WITH CONTRAST (INCLUDING MRCP) TECHNIQUE: Multiplanar multisequence MR imaging of the abdomen was performed both before and after the administration of intravenous contrast. Heavily T2-weighted images of the biliary and pancreatic ducts were obtained, and three-dimensional MRCP images were rendered by post processing. CONTRAST:  18m GADAVIST  GADOBUTROL 1 MMOL/ML IV SOLN COMPARISON:  No prior abdominal MRI. Abdominal ultrasound 04/08/2021. CT the abdomen and pelvis 04/05/2021. FINDINGS: Lower chest: Small bilateral pleural effusions (right greater than left) lying dependently. Bilateral breast implants are incidentally noted. 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 Some of the larger cyst demonstrate varying degrees of complexity, including the largest lesion involving portions of segments 7 and 8 (axial image 9 of series 15 and coronal image 20 of series 5) which measures 8.4 x 7.6 x 7.3 cm and has several internal septations measuring up to 4 mm in thickness, which demonstrates some low-level areas of enhancement. One of the loculations of the cyst also contains some dependent T1 hyperintense material (axial image 15 of series 17), most compatible with a proteinaceous/hemorrhagic debris. No other definite enhancing hepatic lesions are confidently identified. Gallbladder is normal in appearance. No intrahepatic biliary ductal dilatation noted on MRCP images. However, common bile duct is dilated up to 11 mm in the porta hepatis. There are no filling defects within the common bile duct to suggest choledocholithiasis. Ductal dilatation terminates abruptly at the level of the ampulla. Pancreas: No pancreatic mass. No pancreatic ductal dilatation. No pancreatic or peripancreatic fluid collections or inflammatory changes. Spleen:  Unremarkable. Adrenals/Urinary Tract: Kidneys are essentially completely replaced with innumerable cystic lesions of varying degrees of complexity. The majority of these are T1 hypointense, T2 hyperintense and do not enhance, compatible with simple cysts. Many of these lesions demonstrate some thin internal in septations (Bosniak class 2). Some small  lesions demonstrate T1 hyperintensity and mild T2 hypointensity, without definite internal enhancement, compatible with proteinaceous/hemorrhagic cysts.  No definite suspicious renal lesions are confidently identified on today's examination. No hydroureteronephrosis in the visualized portions of the abdomen. Bilateral adrenal glands are normal in appearance. Stomach/Bowel: Visualized portions are unremarkable. Vascular/Lymphatic: No aneurysm identified in the visualized abdominal vasculature. No lymphadenopathy noted in the abdomen. Other: No significant volume of ascites noted in the visualized portions of the peritoneal cavity. Musculoskeletal: No aggressive appearing osseous lesions are noted in the visualized portions of the skeleton. IMPRESSION: 1. 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 2. Dilatation of the common bile duct which measures up to 10 mm in diameter. However, there is no choledocholithiasis, evidence of obstructing mass, pancreatic ductal dilatation, or intrahepatic biliary ductal dilatation to clearly indicate obstruction. This is favored to reflect benign dilatation of the common bile duct, likely related to autosomal dominant polycystic kidney disease, as there is a known association. 3. Small bilateral pleural effusions (right greater than left). 4. Additional incidental findings, as above. Electronically Signed   By: Vinnie Langton M.D.   On: 04/09/2021 06:31   MR ABDOMEN MRCP W WO CONTAST  Result Date: 04/09/2021 CLINICAL DATA:  55 year old female with history of right upper quadrant abdominal pain. Possible common bile duct dilatation noted on recent abdominal ultrasound. Follow-up study. EXAM: MRI ABDOMEN WITHOUT AND WITH CONTRAST (INCLUDING MRCP) TECHNIQUE: Multiplanar multisequence MR imaging of the abdomen was performed both before and after the administration  of intravenous contrast. Heavily T2-weighted images of the biliary and pancreatic ducts were obtained, and three-dimensional MRCP images were rendered by post processing. CONTRAST:  4m GADAVIST GADOBUTROL 1 MMOL/ML IV SOLN COMPARISON:  No prior abdominal MRI. Abdominal ultrasound 04/08/2021. CT the abdomen and pelvis 04/05/2021. FINDINGS: Lower chest: Small bilateral pleural effusions (right greater than left) lying dependently. Bilateral breast implants are incidentally noted. 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 Some of the larger cyst demonstrate varying degrees of complexity, including the largest lesion involving portions of segments 7 and 8 (axial image 9 of series 15 and coronal image 20 of series 5) which measures 8.4 x 7.6 x 7.3 cm and has several internal septations measuring up to 4 mm in thickness, which demonstrates some low-level areas of enhancement. One of the loculations of the cyst also contains some dependent T1 hyperintense material (axial image 15 of series 17), most compatible with a proteinaceous/hemorrhagic debris. No other definite enhancing hepatic lesions are confidently identified. Gallbladder is normal in appearance. No intrahepatic biliary ductal dilatation noted on MRCP images. However, common bile duct is dilated up to 11 mm in the porta hepatis. There are no filling defects within the common bile duct to suggest choledocholithiasis. Ductal dilatation terminates abruptly at the level of the ampulla. Pancreas: No pancreatic mass. No pancreatic ductal dilatation. No pancreatic or peripancreatic fluid collections or inflammatory changes. Spleen:  Unremarkable. Adrenals/Urinary Tract: Kidneys are essentially completely replaced with innumerable cystic lesions of varying degrees of complexity. The majority  of these are T1 hypointense, T2 hyperintense and do not enhance, compatible with simple cysts. Many of these lesions demonstrate some thin internal in septations (Bosniak class 2). Some small lesions demonstrate T1 hyperintensity and mild T2 hypointensity, without definite internal enhancement, compatible with proteinaceous/hemorrhagic cysts. No definite suspicious renal lesions are confidently identified on today's examination. No hydroureteronephrosis in the visualized portions of the  abdomen. Bilateral adrenal glands are normal in appearance. Stomach/Bowel: Visualized portions are unremarkable. Vascular/Lymphatic: No aneurysm identified in the visualized abdominal vasculature. No lymphadenopathy noted in the abdomen. Other: No significant volume of ascites noted in the visualized portions of the peritoneal cavity. Musculoskeletal: No aggressive appearing osseous lesions are noted in the visualized portions of the skeleton. IMPRESSION: 1. 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 2. Dilatation of the common bile duct which measures up to 10 mm in diameter. However, there is no choledocholithiasis, evidence of obstructing mass, pancreatic ductal dilatation, or intrahepatic biliary ductal dilatation to clearly indicate obstruction. This is favored to reflect benign dilatation of the common bile duct, likely related to autosomal dominant polycystic kidney disease, as there is a known association. 3. Small bilateral pleural effusions (right greater than left). 4. Additional incidental findings, as above. Electronically Signed   By: Vinnie Langton M.D.   On: 04/09/2021 06:31        Scheduled Meds:  enoxaparin (LOVENOX) injection  40 mg Subcutaneous Q24H   gabapentin  300 mg Oral BID   morphine   Intravenous Q4H   Continuous Infusions:  lactated ringers with kcl 125 mL/hr at 04/09/21  2346   piperacillin-tazobactam (ZOSYN)  IV 3.375 g (04/10/21 0816)     LOS: 2 days        Hosie Poisson, MD [facility]   To contact the attending provider between 7A-7P or the covering provider during after hours 7P-7A, please log into the web site [email or web address] and access using universal [place] password for that web site. If you do not have the password, please call the hospital operator.  04/10/2021, 12:57 PM

## 2021-04-11 ENCOUNTER — Inpatient Hospital Stay (HOSPITAL_COMMUNITY): Payer: 59

## 2021-04-11 DIAGNOSIS — R778 Other specified abnormalities of plasma proteins: Secondary | ICD-10-CM | POA: Diagnosis not present

## 2021-04-11 DIAGNOSIS — R079 Chest pain, unspecified: Secondary | ICD-10-CM

## 2021-04-11 LAB — ECHOCARDIOGRAM COMPLETE
Area-P 1/2: 4.42 cm2
Height: 60 in
S' Lateral: 2.6 cm
Weight: 1888 oz

## 2021-04-11 LAB — COMPREHENSIVE METABOLIC PANEL
ALT: 49 U/L — ABNORMAL HIGH (ref 0–44)
AST: 52 U/L — ABNORMAL HIGH (ref 15–41)
Albumin: 2 g/dL — ABNORMAL LOW (ref 3.5–5.0)
Alkaline Phosphatase: 80 U/L (ref 38–126)
Anion gap: 9 (ref 5–15)
BUN: 9 mg/dL (ref 6–20)
CO2: 27 mmol/L (ref 22–32)
Calcium: 8.1 mg/dL — ABNORMAL LOW (ref 8.9–10.3)
Chloride: 98 mmol/L (ref 98–111)
Creatinine, Ser: 1.15 mg/dL — ABNORMAL HIGH (ref 0.44–1.00)
GFR, Estimated: 57 mL/min — ABNORMAL LOW (ref >60)
Glucose, Bld: 100 mg/dL — ABNORMAL HIGH (ref 70–99)
Potassium: 3.7 mmol/L (ref 3.5–5.1)
Sodium: 134 mmol/L — ABNORMAL LOW (ref 135–145)
Total Bilirubin: 0.5 mg/dL (ref 0.3–1.2)
Total Protein: 5.6 g/dL — ABNORMAL LOW (ref 6.5–8.1)

## 2021-04-11 LAB — CBC
HCT: 27.4 % — ABNORMAL LOW (ref 36.0–46.0)
Hemoglobin: 9 g/dL — ABNORMAL LOW (ref 12.0–15.0)
MCH: 27.2 pg (ref 26.0–34.0)
MCHC: 32.8 g/dL (ref 30.0–36.0)
MCV: 82.8 fL (ref 80.0–100.0)
Platelets: 403 10*3/uL — ABNORMAL HIGH (ref 150–400)
RBC: 3.31 MIL/uL — ABNORMAL LOW (ref 3.87–5.11)
RDW: 13.5 % (ref 11.5–15.5)
WBC: 8.8 10*3/uL (ref 4.0–10.5)
nRBC: 0 % (ref 0.0–0.2)

## 2021-04-11 MED ORDER — NALOXONE HCL 0.4 MG/ML IJ SOLN: 0.4000 mg | INTRAMUSCULAR | Status: DC | PRN

## 2021-04-11 MED ORDER — HYDROMORPHONE 1 MG/ML IV SOLN
INTRAVENOUS | Status: DC
  Administered 2021-04-11: 0.64 mg via INTRAVENOUS
  Administered 2021-04-11: 0.7 mg via INTRAVENOUS
  Administered 2021-04-11 – 2021-04-12 (×2): 0.8 mg via INTRAVENOUS
  Administered 2021-04-12: 0.6 mg via INTRAVENOUS
  Filled 2021-04-11: qty 30

## 2021-04-11 MED ORDER — DIPHENHYDRAMINE HCL 50 MG/ML IJ SOLN: 12.5000 mg | Freq: Four times a day (QID) | INTRAMUSCULAR | Status: DC | PRN

## 2021-04-11 MED ORDER — BISACODYL 10 MG RE SUPP
10.0000 mg | Freq: Two times a day (BID) | RECTAL | Status: AC
  Administered 2021-04-11: 10 mg via RECTAL
  Filled 2021-04-11: qty 1

## 2021-04-11 MED ORDER — DIPHENHYDRAMINE HCL 12.5 MG/5ML PO ELIX: 12.5000 mg | ORAL_SOLUTION | Freq: Four times a day (QID) | ORAL | Status: DC | PRN

## 2021-04-11 MED ORDER — MAGNESIUM SULFATE 4 GM/100ML IV SOLN
4.0000 g | Freq: Once | INTRAVENOUS | Status: AC
  Administered 2021-04-11: 4 g via INTRAVENOUS
  Filled 2021-04-11: qty 100

## 2021-04-11 MED ORDER — ONDANSETRON HCL 4 MG/2ML IJ SOLN
4.0000 mg | Freq: Four times a day (QID) | INTRAMUSCULAR | Status: DC | PRN
  Administered 2021-04-11 – 2021-04-12 (×3): 4 mg via INTRAVENOUS
  Filled 2021-04-11 (×3): qty 2

## 2021-04-11 MED ORDER — SODIUM CHLORIDE 0.9% FLUSH: 9.0000 mL | INTRAVENOUS | Status: DC | PRN

## 2021-04-11 NOTE — Progress Notes (Signed)
PROGRESS NOTE    Judy Manning  [id number] DOB: 1966/01/13 DOA: 04/08/2021 PCP: Eunice Blase, MD   Chief Complaint  Patient presents with   Fever   Pain    Brief Narrative:  55 year old lady with prior history of polycystic kidney disease, hypertension, migraine headaches presents with right flank pain right-sided abdominal pain started about 2 weeks ago associated with fevers and some nausea and vomiting.  She was recently seen in urgent care on 03/30/2021 was given a prescription for azithromycin and sent home.  Patient reports her symptoms did not improve.  She underwent an MRI of the abdomen which showed mildly dilated common bile duct of 8 mm remains febrile with leukocytosis.    Assessment & Plan:   Principal Problem:   Right upper quadrant pain Active Problems:   Polycystic kidney disease   SIRS (systemic inflammatory response syndrome) (HCC)   Common bile duct dilatation   History of migraine headaches   Hypokalemia, inadequate intake   Hyponatremia   Right upper quadrant pain/ right flank pain.  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 Some of the larger cyst demonstrate varying degrees of complexity, including the largest lesion involving portions of segments 7 and 8 (axial image 9 of series 15 and coronal image 20 of series 5) which measures 8.4 x 7.6 x 7.3 cm and has several internal septations measuring up to 4 mm in thickness, which demonstrates some low-level areas of enhancement. One of the loculations of the cyst also contains some dependent T1 hyperintense material  most compatible with a proteinaceous/hemorrhagic debris. CBD dilated at 11 mm at porta hepatis.   IR Consulted and she underwent aspiration of one liver cyst and along with PCA morphine, she has some relief from the abdominal pain. We had to change  morphine PCA to Dilaudid PCA as she reports pain persistently greater than 5 out of 10.  She was encouraged to use tramadol in addition to PCA.  Patient adamantly refused any oral narcotic medication at this time as she reports nausea to codeine containing pain medications. PE ruled out.  She was empirically started on broad-spectrum IV antibiotics in view of her persistent fevers, IV fluids. GI on board and appreciate input Blood cultures are pending and negative.  UA is not sig for UTI.  Labs pending for today.     SIRS: Improving, blood cultures have been negative so far.   PKD:  Pt reports she has been off Jynarque/ Tolvapton since 10 days, since the onset of fevers. She follows up with Dr Justin Mend outpatient.  Nephrology consulted.    Hypomagnesemia and hypokalemia Replaced.    Elevated troponin  Suspect from demand ischemia from SIRS.  Repeat EKG shows sinus tachycardia without any ischemic changes. She denies any chest pain.  Will get echocardiogram for further eval.    Stage 2 CKD Renal parameters have been stable at 1.2    Normocytic anemia:  ? Blood loss in to the cytic lesions, baseline hemoglobin between 11 to 12.  Dropped to 10 to 9.5 and to 9 today.  Continue to monitor.      DVT prophylaxis: (lovenox. ) Code Status: (Full Code) Family Communication: family at bedside.  Disposition:   Status is: Inpatient  Remains inpatient appropriate because:Ongoing diagnostic testing needed not appropriate for outpatient work up, Unsafe d/c plan, and IV treatments appropriate due to intensity of  illness or inability to take PO  Dispo: The patient is from: Home              Anticipated d/c is to:  pending.              Patient currently is not medically stable to d/c.   Difficult to place patient No       Consultants:  Gastroenterology Nephrology.  IR  Procedures: none.   Antimicrobials:   Antibiotics Given (last 72 hours)     Date/Time Action  Medication Dose Rate   04/08/21 1750 New Bag/Given   cefTRIAXone (ROCEPHIN) 1 g in sodium chloride 0.9 % 100 mL IVPB 1 g 200 mL/hr   04/09/21 0030 New Bag/Given   piperacillin-tazobactam (ZOSYN) IVPB 3.375 g 3.375 g 12.5 mL/hr   04/09/21 0957 New Bag/Given   piperacillin-tazobactam (ZOSYN) IVPB 3.375 g 3.375 g 12.5 mL/hr   04/09/21 1705 New Bag/Given   piperacillin-tazobactam (ZOSYN) IVPB 3.375 g 3.375 g 12.5 mL/hr   04/09/21 2347 New Bag/Given   piperacillin-tazobactam (ZOSYN) IVPB 3.375 g 3.375 g 12.5 mL/hr   04/10/21 0816 New Bag/Given   piperacillin-tazobactam (ZOSYN) IVPB 3.375 g 3.375 g 12.5 mL/hr   04/10/21 1615 New Bag/Given   piperacillin-tazobactam (ZOSYN) IVPB 3.375 g 3.375 g 12.5 mL/hr   04/10/21 2355 New Bag/Given   piperacillin-tazobactam (ZOSYN) IVPB 3.375 g 3.375 g 12.5 mL/hr   04/11/21 0743 New Bag/Given   piperacillin-tazobactam (ZOSYN) IVPB 3.375 g 3.375 g 12.5 mL/hr         Subjective: Persistent abdominal pain associated with some nausea No vomiting, no diarrhea no bowel movement since 1 week  Objective: Vitals:   04/11/21 0744 04/11/21 0949 04/11/21 1101 04/11/21 1121  BP:  123/67    Pulse:  93    Resp: (!) 23 16 (!) 25 20  Temp:  98 F (36.7 C)    TempSrc:  Oral    SpO2: 98% 95% 97% 96%  Weight:      Height:        Intake/Output Summary (Last 24 hours) at 04/11/2021 1307 Last data filed at 04/11/2021 1020 Gross per 24 hour  Intake 4781.22 ml  Output 2050 ml  Net 2731.22 ml    Filed Weights   04/08/21 1120  Weight: 53.5 kg    Examination:  General exam: Well-developed woman, in moderate distress from abdominal pain Respiratory system: Diminished air entry at bases no wheezing or rhonchi on room air Cardiovascular system: S1 & S2 heard, tachycardic, no JVD no pedal edema Gastrointestinal system: Abdomen is soft, moderately tender right upper quadrant, tender right flank, very minimal bowel sounds Central nervous system: Alert and  oriented, grossly nonfocal Extremities: No pedal edema Skin: No rashes seen  psychiatry: Mood & affect appropriate.     Data Reviewed: I have personally reviewed following labs and imaging studies  CBC: Recent Labs  Lab 04/05/21 1906 04/08/21 1211 04/08/21 1214 04/09/21 0532 04/10/21 0925 04/11/21 0609  WBC 10.5 13.6*  --  13.6* 12.6* 8.8  NEUTROABS 8.7* 11.7*  --  11.9* 11.2*  --   HGB 11.3* 10.6* 11.2* 10.1* 9.5* 9.0*  HCT 33.9* 32.7* 33.0* 31.1* 28.7* 27.4*  MCV 80.3 81.8  --  82.5 82.2 82.8  PLT 295 390  --  409* 415* 403*     Basic Metabolic Panel: Recent Labs  Lab 04/05/21 1906 04/08/21 1211 04/08/21 1214 04/09/21 0532 04/10/21 0925 04/10/21 1510  NA 134* 134* 134* 139 136  --   K  3.1* 3.1* 3.1* 4.3 3.8  --   CL 93* 94*  --  102 100  --   CO2 27 28  --  25 28  --   GLUCOSE 101* 102*  --  82 173*  --   BUN 22* 13  --  15 11  --   CREATININE 1.30* 1.28*  --  1.27* 1.26*  --   CALCIUM 9.2 9.0  --  8.5* 8.1*  --   MG  --   --   --  1.4*  --  1.5*     GFR: Estimated Creatinine Clearance: 37.5 mL/min (A) (by C-G formula based on SCr of 1.26 mg/dL (H)).  Liver Function Tests: Recent Labs  Lab 04/05/21 1906 04/08/21 1211 04/09/21 0532 04/10/21 0925  AST 18 45* 44* 73*  ALT 14 32 34 56*  ALKPHOS 105 106 92 98  BILITOT 0.7 0.8 0.9 0.6  PROT 7.3 7.6 6.2* 6.0*  ALBUMIN 3.6 3.5 2.2* 2.2*     CBG: No results for input(s): GLUCAP in the last 168 hours.   Recent Results (from the past 240 hour(s))  Culture, blood (routine x 2)     Status: None   Collection Time: 04/05/21  6:40 PM   Specimen: BLOOD  Result Value Ref Range Status   Specimen Description   Final    BLOOD RIGHT ANTECUBITAL Performed at [facility], [address] [place], [place] [address]    Special Requests   Final    Blood Culture adequate volume BOTTLES DRAWN AEROBIC AND ANAEROBIC Performed at [facility], [address],  [place], [place] [address]    Culture   Final    NO GROWTH 5 DAYS Performed at [facility], [place] [address] [place], [place] [address]    Report Status 04/10/2021 FINAL  Final  Culture, blood (routine x 2)     Status: None   Collection Time: 04/05/21  6:55 PM   Specimen: BLOOD  Result Value Ref Range Status   Specimen Description   Final    BLOOD LEFT ANTECUBITAL Performed at [facility], [address], [place], [place] [address]    Special Requests   Final    Blood Culture adequate volume BOTTLES DRAWN AEROBIC AND ANAEROBIC Performed at [facility], [address] [place], [place] [address]    Culture   Final    NO GROWTH 5 DAYS Performed at [facility], [place] [address], [place], [place] [address]    Report Status 04/10/2021 FINAL  Final  Blood culture (routine x 2)     Status: None (Preliminary result)   Collection Time: 04/08/21 12:00 PM   Specimen: BLOOD RIGHT FOREARM  Result Value Ref Range Status   Specimen Description   Final    BLOOD RIGHT FOREARM Performed at [facility], [address] [place], [place] [address]    Special Requests   Final    BOTTLES DRAWN AEROBIC AND ANAEROBIC Blood Culture adequate volume   Culture   Final    NO GROWTH 2 DAYS Performed at [facility], [place] [address] [place], [place] [address]    Report Status PENDING  Incomplete  Blood culture (routine x 2)     Status: None (Preliminary result)   Collection Time: 04/08/21 12:09 PM   Specimen: BLOOD  Result Value Ref [place] Status   Specimen Description BLOOD LEFT ANTECUBITAL  Final   Special Requests   Final  BOTTLES DRAWN AEROBIC AND ANAEROBIC Blood Culture adequate volume   Culture   Final    NO GROWTH 2 DAYS Performed at [facility], [place] [address] [place], [place] [address]    Report Status PENDING  Incomplete  Resp Panel by RT-PCR (Flu A&B, Covid) Nasopharyngeal Swab     Status: None    Collection Time: 04/08/21  5:38 PM   Specimen: Nasopharyngeal Swab; Nasopharyngeal(NP) swabs in vial transport medium  Result Value Ref Range Status   SARS Coronavirus 2 by RT PCR NEGATIVE NEGATIVE Final    Comment: (NOTE) SARS-CoV-2 target nucleic acids are NOT DETECTED.  The SARS-CoV-2 RNA is generally detectable in upper respiratory specimens during the acute phase of infection. The lowest concentration of SARS-CoV-2 viral copies this assay can detect is 138 copies/mL. A negative result does not preclude SARS-Cov-2 infection and should not be used as the sole basis for treatment or other patient management decisions. A negative result may occur with  improper specimen collection/handling, submission of specimen other than nasopharyngeal swab, presence of viral mutation(s) within the areas targeted by this assay, and inadequate number of viral copies(<138 copies/mL). A negative result must be combined with clinical observations, patient history, and epidemiological information. The expected result is Negative.  Fact Sheet for Patients:  EntrepreneurPulse.com.au  Fact Sheet for Healthcare Providers:  IncredibleEmployment.be  This test is no t yet approved or cleared by the [place] FDA and  has been authorized for detection and/or diagnosis of SARS-CoV-2 by FDA under an Emergency Use Authorization (EUA). This EUA will remain  in effect (meaning this test can be used) for the duration of the COVID-19 declaration under Section 564(b)(1) of the Act, 21 U.S.C.section 360bbb-3(b)(1), unless the authorization is terminated  or revoked sooner.       Influenza A by PCR NEGATIVE NEGATIVE Final   Influenza B by PCR NEGATIVE NEGATIVE Final    Comment: (NOTE) The Xpert Xpress SARS-CoV-2/FLU/RSV plus assay is intended as an aid in the diagnosis of influenza from Nasopharyngeal swab specimens and should not be used as a sole basis for treatment.  Nasal washings and aspirates are unacceptable for Xpert Xpress SARS-CoV-2/FLU/RSV testing.  Fact Sheet for Patients: EntrepreneurPulse.com.au  Fact Sheet for Healthcare Providers: IncredibleEmployment.be  This test is not yet approved or cleared by the [place] FDA and has been authorized for detection and/or diagnosis of SARS-CoV-2 by FDA under an Emergency Use Authorization (EUA). This EUA will remain in effect (meaning this test can be used) for the duration of the COVID-19 declaration under Section 564(b)(1) of the Act, 21 U.S.C. section 360bbb-3(b)(1), unless the authorization is terminated or revoked.  Performed at [facility], [address] [place], [place] [address]   MRSA Next Gen by PCR, Nasal     Status: None   Collection Time: 04/09/21 12:27 PM   Specimen: Nasal Mucosa; Nasal Swab  Result Value Ref Range Status   MRSA by PCR Next Gen NOT DETECTED NOT DETECTED Final    Comment: (NOTE) The GeneXpert MRSA Assay (FDA approved for NASAL specimens only), is one component of a comprehensive MRSA colonization surveillance program. It is not intended to diagnose MRSA infection nor to guide or monitor treatment for MRSA infections. Test performance is not FDA approved in patients less than 38 years old. Performed at [facility], [place] [address], [place],  [address]   Aerobic/Anaerobic Culture w Gram Stain (surgical/deep wound)     Status: None (Preliminary result)  Collection Time: 04/10/21  2:18 PM   Specimen: Liver; Abscess  Result Value Ref Range Status   Specimen Description   Final    LIVER CYSTS FLUID Performed at [place] [address], [place], [place] [address]    Special Requests   Final    NONE Performed at [facility], [place] [address], [place], [place] [address]    Gram Stain   Final    FEW SQUAMOUS EPITHELIAL CELLS  PRESENT FEW WBC SEEN NO ORGANISMS SEEN    Culture   Final    NO GROWTH < 24 HOURS Performed at [facility], [address] [place], [place] [address]    Report Status PENDING  Incomplete          Radiology Studies: [place] FINE NEEDLE ASP 1ST LESION  Result Date: 04/10/2021 INDICATION: 55 year old female referred for aspiration of liver cyst EXAM: IMAGE GUIDED ASPIRATION RIGHT LIVER CYST MEDICATIONS: None ANESTHESIA/SEDATION: Fentanyl 100 mcg IV; Versed 2.0 mg IV Moderate Sedation Time:  16 minutes The patient was continuously monitored during the procedure by the interventional radiology nurse under my direct supervision. COMPLICATIONS: None PROCEDURE: Informed written consent was obtained from the patient after a thorough discussion of the procedural risks, benefits and alternatives. All questions were addressed. Maximal Sterile Barrier Technique was utilized including caps, mask, sterile gowns, sterile gloves, sterile drape, hand hygiene and skin antiseptic. A timeout was performed prior to the initiation of the procedure. Ultrasound survey of the right liver lobe performed with images stored and sent to PACs. The right lower thorax/right upper abdomen was prepped with chlorhexidine in a sterile fashion, and a sterile drape was applied covering the operative field. A sterile gown and sterile gloves were used for the procedure. Local anesthesia was provided with 1% Lidocaine. The patient was prepped and draped sterilely and the skin and subcutaneous tissues were generously infiltrated with 1% lidocaine. Small stab incision was made with 11 blade scalpel and gentle soft tissue spreading was performed with a hemostat. A 10 French drain was then advanced with ultrasound guidance via trocar technique into the targeted large cyst of the right hepatic dome. Drain was advanced into position and the needle was removed. We then aspirated approximally 100 cc of thin yellow fluid with minimal debris.  Sample was sent for culture. After aspiration of the fluid, the drain was removed. Final ultrasound image was performed. The patient tolerated the procedure well and remained hemodynamically stable throughout. No complications were encountered and no significant blood loss was encounter. IMPRESSION: Status post ultrasound-guided aspiration of right hepatic dome cyst. Signed, Dulcy Fanny. Dellia Nims, RPVI [facility] Radiology Electronically Signed   By: Corrie Mckusick D.O.   On: 04/10/2021 15:04        Scheduled Meds:  bisacodyl  10 mg Rectal BID   enoxaparin (LOVENOX) injection  40 mg Subcutaneous Q24H   gabapentin  300 mg Oral BID   HYDROmorphone   Intravenous Q4H   Continuous Infusions:  lactated ringers with kcl 125 mL/hr at 04/11/21 0351   piperacillin-tazobactam (ZOSYN)  IV 3.375 g (04/11/21 0743)     LOS: 3 days        Hosie Poisson, MD [facility]   To contact the attending provider between 7A-7P or the covering provider during after hours 7P-7A, please log into the web site [email or web address] and access using universal [place] password for that web site. If you do not have the  password, please call the hospital operator.  04/11/2021, 1:07 PM

## 2021-04-11 NOTE — Progress Notes (Signed)
Subjective: Patient states her pain is slightly better controlled on the PCA pump, although she still hurts 6 out of 10 in intensity, cannot move much, can only drink fluids with a straw as it is difficult for her to eat, can hardly lift her head.  She has not had a bowel movement in several days, complains of abdominal distention, generalized, however is afraid to have a bowel movement as any form of movement causes severe pain.  Objective: Vital signs in last 24 hours: Temp:  [98 F (36.7 C)-98.6 F (37 C)] 98 F (36.7 C) (09/11 0949) Pulse Rate:  [85-112] 93 (09/11 0949) Resp:  [14-25] 16 (09/11 0949) BP: (112-137)/(54-85) 123/67 (09/11 0949) SpO2:  [85 %-99 %] 95 % (09/11 0949) Weight change:  Last BM Date: 04/08/21  PE: Continues to be in distress due to pain GENERAL: Anxious, tearful  ABDOMEN: Distended, mild generalized tenderness, normoactive bowel sounds otherwise EXTREMITIES: No deformity, no edema  Lab Results: Results for orders placed or performed during the hospital encounter of 04/08/21 (from the past 48 hour(s))  MRSA Next Gen by PCR, Nasal     Status: None   Collection Time: 04/09/21 12:27 PM   Specimen: Nasal Mucosa; Nasal Swab  Result Value Ref Range   MRSA by PCR Next Gen NOT DETECTED NOT DETECTED    Comment: (NOTE) The GeneXpert MRSA Assay (FDA approved for NASAL specimens only), is one component of a comprehensive MRSA colonization surveillance program. It is not intended to diagnose MRSA infection nor to guide or monitor treatment for MRSA infections. Test performance is not FDA approved in patients less than 72 years old. Performed at [facility], [place] [address], [place], [place] [address]   CBC with Differential/Platelet     Status: Abnormal   Collection Time: 04/10/21  9:25 AM  Result Value Ref Range   WBC 12.6 (H) 4.0 - 10.5 K/uL   RBC 3.49 (L) 3.87 - 5.11 MIL/uL   Hemoglobin 9.5 (L) 12.0 - 15.0 g/dL   HCT 28.7 (L) 36.0 - 46.0  %   MCV 82.2 80.0 - 100.0 fL   MCH 27.2 26.0 - 34.0 pg   MCHC 33.1 30.0 - 36.0 g/dL   RDW 13.7 11.5 - 15.5 %   Platelets 415 (H) 150 - 400 K/uL   nRBC 0.0 0.0 - 0.2 %   Neutrophils Relative % 89 %   Neutro Abs 11.2 (H) 1.7 - 7.7 K/uL   Lymphocytes Relative 4 %   Lymphs Abs 0.5 (L) 0.7 - 4.0 K/uL   Monocytes Relative 5 %   Monocytes Absolute 0.6 0.1 - 1.0 K/uL   Eosinophils Relative 1 %   Eosinophils Absolute 0.2 0.0 - 0.5 K/uL   Basophils Relative 0 %   Basophils Absolute 0.0 0.0 - 0.1 K/uL   Immature Granulocytes 1 %   Abs Immature Granulocytes 0.09 (H) 0.00 - 0.07 K/uL    Comment: Performed at [facility], [place] [address] [place], [place] [id number]  Basic metabolic panel     Status: Abnormal   Collection Time: 04/10/21  9:25 AM  Result Value Ref Range   Sodium 136 135 - 145 mmol/L   Potassium 3.8 3.5 - 5.1 mmol/L   Chloride 100 98 - 111 mmol/L   CO2 28 22 - 32 mmol/L   Glucose, Bld 173 (H) 70 - 99 mg/dL    Comment: Glucose reference range applies only to samples taken after fasting for at least 8 hours.  BUN 11 6 - 20 mg/dL   Creatinine, Ser 1.26 (H) 0.44 - 1.00 mg/dL   Calcium 8.1 (L) 8.9 - 10.3 mg/dL   GFR, Estimated 51 (L) >60 mL/min    Comment: (NOTE) Calculated using the CKD-EPI Creatinine Equation (2021)    Anion gap 8 5 - 15    Comment: Performed at [facility], [place] [address] [place], [place] [address]  Hepatic function panel     Status: Abnormal   Collection Time: 04/10/21  9:25 AM  Result Value Ref Range   Total Protein 6.0 (L) 6.5 - 8.1 g/dL   Albumin 2.2 (L) 3.5 - 5.0 g/dL   AST 73 (H) 15 - 41 U/L   ALT 56 (H) 0 - 44 U/L   Alkaline Phosphatase 98 38 - 126 U/L   Total Bilirubin 0.6 0.3 - 1.2 mg/dL   Bilirubin, Direct 0.2 0.0 - 0.2 mg/dL   Indirect Bilirubin 0.4 0.3 - 0.9 mg/dL    Comment: Performed at [facility], [place] [address], [place], [place] [address]  Aerobic/Anaerobic Culture w Gram  Stain (surgical/deep wound)     Status: None (Preliminary result)   Collection Time: 04/10/21  2:18 PM   Specimen: Liver; Abscess  Result Value Ref Range   Specimen Description      LIVER CYSTS FLUID Performed at [place] [address] [place], [place] [address]    Special Requests      NONE Performed at [facility], [place] [address] [place], [place] [address]    Gram Stain      FEW SQUAMOUS EPITHELIAL CELLS PRESENT FEW WBC SEEN NO ORGANISMS SEEN Performed at [facility], [place] [address] [place], [place] [address]    Culture PENDING    Report Status PENDING   Magnesium     Status: Abnormal   Collection Time: 04/10/21  3:10 PM  Result Value Ref Range   Magnesium 1.5 (L) 1.7 - 2.4 mg/dL    Comment: Performed at [facility], [place] [address] [place], [place] [address]  CBC     Status: Abnormal   Collection Time: 04/11/21  6:09 AM  Result Value Ref Range   WBC 8.8 4.0 - 10.5 K/uL   RBC 3.31 (L) 3.87 - 5.11 MIL/uL   Hemoglobin 9.0 (L) 12.0 - 15.0 g/dL   HCT 27.4 (L) 36.0 - 46.0 %   MCV 82.8 80.0 - 100.0 fL   MCH 27.2 26.0 - 34.0 pg   MCHC 32.8 30.0 - 36.0 g/dL   RDW 13.5 11.5 - 15.5 %   Platelets 403 (H) 150 - 400 K/uL   nRBC 0.0 0.0 - 0.2 %    Comment: Performed at [facility], [place] [address] [place], [place] [address]    Studies/Results: [place] FINE NEEDLE ASP 1ST LESION  Result Date: 04/10/2021 INDICATION: 55 year old female referred for aspiration of liver cyst EXAM: IMAGE GUIDED ASPIRATION RIGHT LIVER CYST MEDICATIONS: None ANESTHESIA/SEDATION: Fentanyl 100 mcg IV; Versed 2.0 mg IV Moderate Sedation Time:  16 minutes The patient was continuously monitored during the procedure by the interventional radiology nurse under my direct supervision. COMPLICATIONS: None PROCEDURE: Informed written consent was obtained from the patient after a thorough discussion of the procedural risks, benefits  and alternatives. All questions were addressed. Maximal Sterile Barrier Technique was utilized including caps, mask, sterile gowns, sterile gloves, sterile drape, hand hygiene and skin antiseptic. A timeout was performed prior to  the initiation of the procedure. Ultrasound survey of the right liver lobe performed with images stored and sent to PACs. The right lower thorax/right upper abdomen was prepped with chlorhexidine in a sterile fashion, and a sterile drape was applied covering the operative field. A sterile gown and sterile gloves were used for the procedure. Local anesthesia was provided with 1% Lidocaine. The patient was prepped and draped sterilely and the skin and subcutaneous tissues were generously infiltrated with 1% lidocaine. Small stab incision was made with 11 blade scalpel and gentle soft tissue spreading was performed with a hemostat. A 10 French drain was then advanced with ultrasound guidance via trocar technique into the targeted large cyst of the right hepatic dome. Drain was advanced into position and the needle was removed. We then aspirated approximally 100 cc of thin yellow fluid with minimal debris. Sample was sent for culture. After aspiration of the fluid, the drain was removed. Final ultrasound image was performed. The patient tolerated the procedure well and remained hemodynamically stable throughout. No complications were encountered and no significant blood loss was encounter. IMPRESSION: Status post ultrasound-guided aspiration of right hepatic dome cyst. Signed, Dulcy Fanny. Dellia Nims, RPVI [facility] Radiology Electronically Signed   By: Corrie Mckusick D.O.   On: 04/10/2021 15:04    Medications: I have reviewed the patient's current medications.  Assessment: Polycystic liver disease, large cysts with internal debris Status post right hepatic dome cyst drainage by IR with aspiration of 100 cc of thin yellow fluid performed  yesterday  On Dilaudid PCA pump for pain control along with gabapentin 300 mg twice a day and tramadol 100 mg every 6 hours as needed for moderate pain  On IV Zosyn, empiric treatment for possible infected cyst Improvement in WBC, now normal at 8.8, mild anemia hemoglobin 9 with elevated platelet of 403  Polycystic kidney disease, creatinine 1.26 and GFR 51 yesterday Patient continues to be in pain, although there is some improvement since drainage of 1 cyst and starting PCA pump for pain control. Patient unable to get out of bed to chair or ambulate, abdominal distended and complains of constipation  Plan: Advance diet to regular if tolerated. Dulcolax suppository 10 mg twice a day today. Recommend continued pain management for better pain control, eventually patient will need early and aggressive ambulation to prevent other complications. Patient may require drainage of other liver cysts if she continues to remain symptomatic. As an outpatient,once pain is adequately controlled, patient will need to be referred to outpatient tertiary transplant center for evaluation for liver and kidney transplant.   Ronnette Juniper, MD 04/11/2021, 10:20 AM

## 2021-04-11 NOTE — Progress Notes (Signed)
  Echocardiogram 2D Echocardiogram has been performed.  Darlina Sicilian M 04/11/2021, 1:19 PM

## 2021-04-12 ENCOUNTER — Ambulatory Visit: Payer: 59 | Admitting: Infectious Diseases

## 2021-04-12 LAB — CBC WITH DIFFERENTIAL/PLATELET
Abs Immature Granulocytes: 0.06 10*3/uL (ref 0.00–0.07)
Basophils Absolute: 0 10*3/uL (ref 0.0–0.1)
Basophils Relative: 0 %
Eosinophils Absolute: 0.3 10*3/uL (ref 0.0–0.5)
Eosinophils Relative: 3 %
HCT: 28.2 % — ABNORMAL LOW (ref 36.0–46.0)
Hemoglobin: 9.3 g/dL — ABNORMAL LOW (ref 12.0–15.0)
Immature Granulocytes: 1 %
Lymphocytes Relative: 5 %
Lymphs Abs: 0.5 10*3/uL — ABNORMAL LOW (ref 0.7–4.0)
MCH: 27.3 pg (ref 26.0–34.0)
MCHC: 33 g/dL (ref 30.0–36.0)
MCV: 82.7 fL (ref 80.0–100.0)
Monocytes Absolute: 0.5 10*3/uL (ref 0.1–1.0)
Monocytes Relative: 5 %
Neutro Abs: 8.7 10*3/uL — ABNORMAL HIGH (ref 1.7–7.7)
Neutrophils Relative %: 86 %
Platelets: 488 10*3/uL — ABNORMAL HIGH (ref 150–400)
RBC: 3.41 MIL/uL — ABNORMAL LOW (ref 3.87–5.11)
RDW: 13.1 % (ref 11.5–15.5)
WBC: 10.1 10*3/uL (ref 4.0–10.5)
nRBC: 0 % (ref 0.0–0.2)

## 2021-04-12 LAB — COMPREHENSIVE METABOLIC PANEL
ALT: 47 U/L — ABNORMAL HIGH (ref 0–44)
AST: 40 U/L (ref 15–41)
Albumin: 2.2 g/dL — ABNORMAL LOW (ref 3.5–5.0)
Alkaline Phosphatase: 94 U/L (ref 38–126)
Anion gap: 12 (ref 5–15)
BUN: 7 mg/dL (ref 6–20)
CO2: 28 mmol/L (ref 22–32)
Calcium: 8.1 mg/dL — ABNORMAL LOW (ref 8.9–10.3)
Chloride: 92 mmol/L — ABNORMAL LOW (ref 98–111)
Creatinine, Ser: 1.08 mg/dL — ABNORMAL HIGH (ref 0.44–1.00)
GFR, Estimated: >60 mL/min (ref >60)
Glucose, Bld: 98 mg/dL (ref 70–99)
Potassium: 4.1 mmol/L (ref 3.5–5.1)
Sodium: 132 mmol/L — ABNORMAL LOW (ref 135–145)
Total Bilirubin: 0.5 mg/dL (ref 0.3–1.2)
Total Protein: 6.1 g/dL — ABNORMAL LOW (ref 6.5–8.1)

## 2021-04-12 LAB — MAGNESIUM: Magnesium: 1.6 mg/dL — ABNORMAL LOW (ref 1.7–2.4)

## 2021-04-12 MED ORDER — MAGNESIUM SULFATE 4 GM/100ML IV SOLN
4.0000 g | Freq: Once | INTRAVENOUS | Status: AC
  Administered 2021-04-12: 4 g via INTRAVENOUS
  Filled 2021-04-12: qty 100

## 2021-04-12 MED ORDER — NALOXONE HCL 0.4 MG/ML IJ SOLN: 0.4000 mg | INTRAMUSCULAR | Status: DC | PRN

## 2021-04-12 MED ORDER — SODIUM CHLORIDE 0.9 % IV SOLN
12.5000 mg | Freq: Four times a day (QID) | INTRAVENOUS | Status: DC | PRN
  Administered 2021-04-12: 12.5 mg via INTRAVENOUS
  Filled 2021-04-12: qty 12.5

## 2021-04-12 MED ORDER — SODIUM CHLORIDE 0.9% FLUSH: 9.0000 mL | INTRAVENOUS | Status: DC | PRN

## 2021-04-12 MED ORDER — HYDROMORPHONE 1 MG/ML IV SOLN
INTRAVENOUS | Status: DC
  Administered 2021-04-12: 0 mg via INTRAVENOUS
  Administered 2021-04-12: 0.9 mg via INTRAVENOUS
  Administered 2021-04-12: 1.2 mg via INTRAVENOUS
  Administered 2021-04-13: 0.6 mg via INTRAVENOUS
  Administered 2021-04-13: 0.9 mg via INTRAVENOUS
  Administered 2021-04-13: 0.3 mg via INTRAVENOUS
  Administered 2021-04-13: 0.9 mg via INTRAVENOUS
  Administered 2021-04-13: 0.3 mg via INTRAVENOUS
  Administered 2021-04-14: 0.6 mg via INTRAVENOUS
  Administered 2021-04-14 (×2): 0.3 mg via INTRAVENOUS

## 2021-04-12 NOTE — Progress Notes (Signed)
PROGRESS NOTE    Judy Manning  [id number] DOB: September 25, 1965 DOA: 04/08/2021 PCP: Eunice Blase, MD   Chief Complaint  Patient presents with   Fever   Pain    Brief Narrative:  55 year old lady with prior history of polycystic kidney disease, hypertension, migraine headaches presents with right flank pain right-sided abdominal pain started about 2 weeks ago associated with fevers and some nausea and vomiting.  She was recently seen in urgent care on 03/30/2021 was given a prescription for azithromycin and sent home.  Patient reports her symptoms did not improve.  She underwent an MRI of the abdomen which showed mildly dilated common bile duct of 8 mm remains febrile with leukocytosis.   Pt has been in severe pain in the RUQ and right flank pain without much improvement on morphine PCA, changed to dilaudid PCA, associated with nausea possibly from the PCA.   Assessment & Plan:   Principal Problem:   Right upper quadrant pain Active Problems:   Polycystic kidney disease   SIRS (systemic inflammatory response syndrome) (HCC)   Common bile duct dilatation   History of migraine headaches   Hypokalemia, inadequate intake   Hyponatremia   Right upper quadrant pain/ right flank pain.  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 Some of the larger cyst demonstrate varying degrees of complexity, including the largest lesion involving portions of segments 7 and 8 (axial image 9 of series 15 and coronal image 20 of series 5) which measures 8.4 x 7.6 x 7.3 cm and has several internal septations measuring up to 4 mm in thickness, which demonstrates some low-level areas of enhancement. One of the loculations of the cyst also contains some dependent T1 hyperintense material  most compatible with a proteinaceous/hemorrhagic debris. CBD dilated at 11 mm  at porta hepatis.   IR Consulted and she underwent aspiration of one liver cyst and along with PCA morphine, she has some relief from the abdominal pain. We had to change morphine PCA to Dilaudid PCA as she reports pain persistently greater than 5 out of 10.  She was encouraged to use tramadol in addition to PCA.  Patient adamantly refused any oral narcotic medication at this time as she reports nausea to codeine containing pain medications.  PT seen and examined this am, pt able to sit up and eat a little today. GI recommends surgical consultation for cyst fenestration. Discussed with Dr Redmond Pulling who will see the patient .  She was empirically started on broad-spectrum IV antibiotics in view of her persistent fevers which will be continues as she improves with normalization of wbc count, remained afebrile since initiating antibiotics. Cultures from the cyst aspiration are negative so far.  GI on board and appreciate input Blood cultures are negative.  UA is not sig for UTI.      SIRS: Improving, blood cultures have been negative so far.   PKD:  Pt reports she has been off Jynarque/ Tolvapton since 10 days, since the onset of fevers. She follows up with Dr Justin Mend outpatient.  Nephrology signed off.    Hypomagnesemia and hypokalemia Replaced.    Elevated troponin  Suspect from demand ischemia from SIRS.  Repeat EKG shows sinus tachycardia without any ischemic changes. She denies any chest pain.  Echocardiogram unremarkable.    Stage 2 CKD Renal parameters have improved.    Normocytic anemia:  ? Blood loss  in to the cytic lesions, baseline hemoglobin between 11 to 12.  Dropped to 10 to 9.5 and to 9 today.  Continue to monitor.      DVT prophylaxis: (lovenox. ) Code Status: (Full Code) Family Communication: family at bedside.  Disposition:   Status is: Inpatient  Remains inpatient appropriate because:Ongoing diagnostic testing needed not appropriate for outpatient work up,  Unsafe d/c plan, and IV treatments appropriate due to intensity of illness or inability to take PO  Dispo: The patient is from: Home              Anticipated d/c is to:  pending.              Patient currently is not medically stable to d/c.   Difficult to place patient No       Consultants:  Gastroenterology Nephrology.  IR  Procedures: none.   Antimicrobials:   Antibiotics Given (last 72 hours)     Date/Time Action Medication Dose Rate   04/09/21 1705 New Bag/Given   piperacillin-tazobactam (ZOSYN) IVPB 3.375 g 3.375 g 12.5 mL/hr   04/09/21 2347 New Bag/Given   piperacillin-tazobactam (ZOSYN) IVPB 3.375 g 3.375 g 12.5 mL/hr   04/10/21 0816 New Bag/Given   piperacillin-tazobactam (ZOSYN) IVPB 3.375 g 3.375 g 12.5 mL/hr   04/10/21 1615 New Bag/Given   piperacillin-tazobactam (ZOSYN) IVPB 3.375 g 3.375 g 12.5 mL/hr   04/10/21 2355 New Bag/Given   piperacillin-tazobactam (ZOSYN) IVPB 3.375 g 3.375 g 12.5 mL/hr   04/11/21 0743 New Bag/Given   piperacillin-tazobactam (ZOSYN) IVPB 3.375 g 3.375 g 12.5 mL/hr   04/11/21 1600 New Bag/Given   piperacillin-tazobactam (ZOSYN) IVPB 3.375 g 3.375 g 12.5 mL/hr   04/11/21 2351 New Bag/Given   piperacillin-tazobactam (ZOSYN) IVPB 3.375 g 3.375 g 12.5 mL/hr   04/12/21 0801 New Bag/Given   piperacillin-tazobactam (ZOSYN) IVPB 3.375 g 3.375 g 12.5 mL/hr         Subjective: Abd pain improving, some nausea earlier this am, not improving with zofran, changed to phenergan.  2 BM in the last 24 hours.   Objective: Vitals:   04/12/21 0355 04/12/21 0620 04/12/21 0632 04/12/21 0947  BP:  (!) 147/83    Pulse:  (!) 117 (!) 104   Resp: 19 19    Temp:  98 F (36.7 C)    TempSrc:  Oral    SpO2: 93% 92%  97%  Weight:      Height:        Intake/Output Summary (Last 24 hours) at 04/12/2021 1500 Last data filed at 04/12/2021 [id number] Gross per 24 hour  Intake 240 ml  Output 1200 ml  Net -960 ml    Filed Weights   04/08/21 1120   Weight: 53.5 kg    Examination:  General exam: well developed lady not in distress this am.  Respiratory system: Clear to auscultation. Respiratory effort normal. Cardiovascular system: S1 & S2 heard, RRR. No JVD,  No pedal edema. Gastrointestinal system: Abdomen is soft, tenderness generalized. Distended, bowel sounds minimal.  Central nervous system: Alert and oriented. No focal neurological deficits. Extremities: Symmetric 5 x 5 power. Skin: No rashes, lesions or ulcers Psychiatry: very anxious..   Data Reviewed: I have personally reviewed following labs and imaging studies  CBC: Recent Labs  Lab 04/05/21 1906 04/08/21 1211 04/08/21 1214 04/09/21 0532 04/10/21 0925 04/11/21 0609 04/12/21 1032  WBC 10.5 13.6*  --  13.6* 12.6* 8.8 10.1  NEUTROABS 8.7* 11.7*  --  11.9* 11.2*  --  8.7*  HGB 11.3* 10.6* 11.2* 10.1* 9.5* 9.0* 9.3*  HCT 33.9* 32.7* 33.0* 31.1* 28.7* 27.4* 28.2*  MCV 80.3 81.8  --  82.5 82.2 82.8 82.7  PLT 295 390  --  409* 415* 403* 488*     Basic Metabolic Panel: Recent Labs  Lab 04/08/21 1211 04/08/21 1214 04/09/21 0532 04/10/21 0925 04/10/21 1510 04/11/21 0609 04/12/21 1032  NA 134* 134* 139 136  --  134* 132*  K 3.1* 3.1* 4.3 3.8  --  3.7 4.1  CL 94*  --  102 100  --  98 92*  CO2 28  --  25 28  --  27 28  GLUCOSE 102*  --  82 173*  --  100* 98  BUN 13  --  15 11  --  9 7  CREATININE 1.28*  --  1.27* 1.26*  --  1.15* 1.08*  CALCIUM 9.0  --  8.5* 8.1*  --  8.1* 8.1*  MG  --   --  1.4*  --  1.5*  --  1.6*     GFR: Estimated Creatinine Clearance: 43.8 mL/min (A) (by C-G formula based on SCr of 1.08 mg/dL (H)).  Liver Function Tests: Recent Labs  Lab 04/08/21 1211 04/09/21 0532 04/10/21 0925 04/11/21 0609 04/12/21 1032  AST 45* 44* 73* 52* 40  ALT 32 34 56* 49* 47*  ALKPHOS 106 92 98 80 94  BILITOT 0.8 0.9 0.6 0.5 0.5  PROT 7.6 6.2* 6.0* 5.6* 6.1*  ALBUMIN 3.5 2.2* 2.2* 2.0* 2.2*     CBG: No results for input(s): GLUCAP in  the last 168 hours.   Recent Results (from the past 240 hour(s))  Culture, blood (routine x 2)     Status: None   Collection Time: 04/05/21  6:40 PM   Specimen: BLOOD  Result Value Ref Range Status   Specimen Description   Final    BLOOD RIGHT ANTECUBITAL Performed at [facility], [address] [place], [place] [address]    Special Requests   Final    Blood Culture adequate volume BOTTLES DRAWN AEROBIC AND ANAEROBIC Performed at [facility], [address] [place], [place] [address]    Culture   Final    NO GROWTH 5 DAYS Performed at [facility], [place] [address], [place], [place] [address]    Report Status 04/10/2021 FINAL  Final  Culture, blood (routine x 2)     Status: None   Collection Time: 04/05/21  6:55 PM   Specimen: BLOOD  Result Value Ref Range Status   Specimen Description   Final    BLOOD LEFT ANTECUBITAL Performed at [facility], [address] [place], [place] [address]    Special Requests   Final    Blood Culture adequate volume BOTTLES DRAWN AEROBIC AND ANAEROBIC Performed at [facility], [address], [place], [place] [address]    Culture   Final    NO GROWTH 5 DAYS Performed at [facility], [place] [address], [place], [place] [address]    Report Status 04/10/2021 FINAL  Final  Blood culture (routine x 2)     Status: None (Preliminary result)   Collection Time: 04/08/21 12:00 PM   Specimen: BLOOD RIGHT FOREARM  Result Value Ref Range Status   Specimen Description   Final    BLOOD RIGHT FOREARM Performed at [facility], [address] [place], [place] [address]    Special Requests  Final    BOTTLES DRAWN AEROBIC AND ANAEROBIC Blood Culture adequate volume   Culture   Final    NO GROWTH 4 DAYS Performed at [facility], [place] [address] [place], [place] [address]    Report Status PENDING  Incomplete  Blood culture (routine x  2)     Status: None (Preliminary result)   Collection Time: 04/08/21 12:09 PM   Specimen: BLOOD  Result Value Ref Range Status   Specimen Description BLOOD LEFT ANTECUBITAL  Final   Special Requests   Final    BOTTLES DRAWN AEROBIC AND ANAEROBIC Blood Culture adequate volume   Culture   Final    NO GROWTH 4 DAYS Performed at [facility], [place] [address], [place], [place] [address]    Report Status PENDING  Incomplete  Resp Panel by RT-PCR (Flu A&B, Covid) Nasopharyngeal Swab     Status: None   Collection Time: 04/08/21  5:38 PM   Specimen: Nasopharyngeal Swab; Nasopharyngeal(NP) swabs in vial transport medium  Result Value Ref Range Status   SARS Coronavirus 2 by RT PCR NEGATIVE NEGATIVE Final    Comment: (NOTE) SARS-CoV-2 target nucleic acids are NOT DETECTED.  The SARS-CoV-2 RNA is generally detectable in upper respiratory specimens during the acute phase of infection. The lowest concentration of SARS-CoV-2 viral copies this assay can detect is 138 copies/mL. A negative result does not preclude SARS-Cov-2 infection and should not be used as the sole basis for treatment or other patient management decisions. A negative result may occur with  improper specimen collection/handling, submission of specimen other than nasopharyngeal swab, presence of viral mutation(s) within the areas targeted by this assay, and inadequate number of viral copies(<138 copies/mL). A negative result must be combined with clinical observations, patient history, and epidemiological information. The expected result is Negative.  Fact Sheet for Patients:  EntrepreneurPulse.com.au  Fact Sheet for Healthcare Providers:  IncredibleEmployment.be  This test is no t yet approved or cleared by the [place] FDA and  has been authorized for detection and/or diagnosis of SARS-CoV-2 by FDA under an Emergency Use Authorization (EUA). This EUA will remain  in effect  (meaning this test can be used) for the duration of the COVID-19 declaration under Section 564(b)(1) of the Act, 21 U.S.C.section 360bbb-3(b)(1), unless the authorization is terminated  or revoked sooner.       Influenza A by PCR NEGATIVE NEGATIVE Final   Influenza B by PCR NEGATIVE NEGATIVE Final    Comment: (NOTE) The Xpert Xpress SARS-CoV-2/FLU/RSV plus assay is intended as an aid in the diagnosis of influenza from Nasopharyngeal swab specimens and should not be used as a sole basis for treatment. Nasal washings and aspirates are unacceptable for Xpert Xpress SARS-CoV-2/FLU/RSV testing.  Fact Sheet for Patients: EntrepreneurPulse.com.au  Fact Sheet for Healthcare Providers: IncredibleEmployment.be  This test is not yet approved or cleared by the [place] FDA and has been authorized for detection and/or diagnosis of SARS-CoV-2 by FDA under an Emergency Use Authorization (EUA). This EUA will remain in effect (meaning this test can be used) for the duration of the COVID-19 declaration under Section 564(b)(1) of the Act, 21 U.S.C. section 360bbb-3(b)(1), unless the authorization is terminated or revoked.  Performed at [facility], [address], [place], [place] [address]   MRSA Next Gen by PCR, Nasal     Status: None   Collection Time: 04/09/21 12:27 PM   Specimen: Nasal Mucosa; Nasal Swab  Result Value Ref Range Status  MRSA by PCR Next Gen NOT DETECTED NOT DETECTED Final    Comment: (NOTE) The GeneXpert MRSA Assay (FDA approved for NASAL specimens only), is one component of a comprehensive MRSA colonization surveillance program. It is not intended to diagnose MRSA infection nor to guide or monitor treatment for MRSA infections. Test performance is not FDA approved in patients less than 53 years old. Performed at [facility], [place] [address], [place], [place] [address]   Aerobic/Anaerobic  Culture w Gram Stain (surgical/deep wound)     Status: None (Preliminary result)   Collection Time: 04/10/21  2:18 PM   Specimen: Liver; Abscess  Result Value Ref Range Status   Specimen Description   Final    LIVER CYSTS FLUID Performed at [facility] [address] [place], [place] [address]    Special Requests   Final    NONE Performed at [facility], [place] [address], [place], [place] [address]    Gram Stain   Final    FEW SQUAMOUS EPITHELIAL CELLS PRESENT FEW WBC SEEN NO ORGANISMS SEEN    Culture   Final    NO GROWTH 2 DAYS Performed at [facility], [address], [place], [place] [address]    Report Status PENDING  Incomplete          Radiology Studies: ECHOCARDIOGRAM COMPLETE  Result Date: 04/11/2021    ECHOCARDIOGRAM REPORT   Patient Name:   EMELINA BENNICK Date of Exam: 04/11/2021 Medical Rec #:  [id number]        Height:       60.0 in Accession #:    [id number]       Weight:       118.0 lb Date of Birth:  09/04/1965        BSA:          1.492 m Patient Age:    67 years         BP:           123/67 mmHg Patient Gender: F                HR:           106 bpm. Exam Location:  Inpatient Procedure: 2D Echo, Cardiac Doppler and Color Doppler Indications:    Elevated Troponin  History:        Patient has no prior history of Echocardiogram examinations.                 Polycystic kidney disease, hypertension, chronic kidney disease.                 Fevers fatigue and right chest/flank pain.  Sonographer:    Darlina Sicilian RDCS Referring Phys: Theola Sequin  Sonographer Comments: Augemntation Mammaplasty. IMPRESSIONS  1. Left ventricular ejection fraction, by estimation, is 60 to 65%. The left ventricle has normal function. The left ventricle has no regional wall motion abnormalities. Left ventricular diastolic parameters were normal.  2. Right ventricular systolic function is normal. The right ventricular size is normal.  3. The  mitral valve is normal in structure. No evidence of mitral valve regurgitation. No evidence of mitral stenosis.  4. The aortic valve is tricuspid. Aortic valve regurgitation is not visualized. No aortic stenosis is present.  5. The inferior vena cava is normal in size with greater than 50% respiratory variability, suggesting right atrial pressure of 3 mmHg. FINDINGS  Left Ventricle: Left ventricular ejection fraction, by estimation,  is 60 to 65%. The left ventricle has normal function. The left ventricle has no regional wall motion abnormalities. The left ventricular internal cavity size was normal in size. There is  no left ventricular hypertrophy. Left ventricular diastolic parameters were normal. Right Ventricle: The right ventricular size is normal. Right ventricular systolic function is normal. Left Atrium: Left atrial size was normal in size. Right Atrium: Right atrial size was normal in size. Pericardium: Trivial pericardial effusion is present. Mitral Valve: The mitral valve is normal in structure. No evidence of mitral valve regurgitation. No evidence of mitral valve stenosis. Tricuspid Valve: The tricuspid valve is normal in structure. Tricuspid valve regurgitation is trivial. No evidence of tricuspid stenosis. Aortic Valve: The aortic valve is tricuspid. Aortic valve regurgitation is not visualized. No aortic stenosis is present. Pulmonic Valve: The pulmonic valve was normal in structure. Pulmonic valve regurgitation is not visualized. No evidence of pulmonic stenosis. Aorta: The aortic root is normal in size and structure. Venous: The inferior vena cava is normal in size with greater than 50% respiratory variability, suggesting right atrial pressure of 3 mmHg. IAS/Shunts: No atrial level shunt detected by color flow Doppler.  LEFT VENTRICLE PLAX 2D LVIDd:         3.80 cm  Diastology LVIDs:         2.60 cm  LV e' medial:    7.15 cm/s LV PW:         0.80 cm  LV E/e' medial:  6.9 LV IVS:        0.90 cm  LV  e' lateral:   8.94 cm/s LVOT diam:     1.70 cm  LV E/e' lateral: 5.5 LV SV:         29 LV SV Index:   19 LVOT Area:     2.27 cm  RIGHT VENTRICLE RV S prime:     15.20 cm/s TAPSE (M-mode): 1.9 cm LEFT ATRIUM           Index LA diam:      3.00 cm 2.01 cm/m LA Vol (A4C): 25.6 ml 17.16 ml/m  AORTIC VALVE LVOT Vmax:   75.30 cm/s LVOT Vmean:  47.300 cm/s LVOT VTI:    0.126 m  AORTA Ao Root diam: 2.60 cm Ao Asc diam:  2.70 cm MITRAL VALVE MV Area (PHT): 4.42 cm    SHUNTS MV Decel Time: 172 msec    Systemic VTI:  0.13 m MV E velocity: 49.30 cm/s  Systemic Diam: 1.70 cm MV A velocity: 51.00 cm/s MV E/A ratio:  0.97 Kirk Ruths MD Electronically signed by Kirk Ruths MD Signature Date/Time: 04/11/2021/1:54:05 PM    Final         Scheduled Meds:  enoxaparin (LOVENOX) injection  40 mg Subcutaneous Q24H   gabapentin  300 mg Oral BID   HYDROmorphone   Intravenous Q4H   Continuous Infusions:  lactated ringers with kcl 100 mL/hr at 04/11/21 1200   piperacillin-tazobactam (ZOSYN)  IV 3.375 g (04/12/21 0801)   promethazine (PHENERGAN) injection (IM or IVPB) 12.5 mg (04/12/21 1034)     LOS: 4 days        Hosie Poisson, MD [facility]   To contact the attending provider between 7A-7P or the covering provider during after hours 7P-7A, please log into the web site [email or web address] and access using universal [place] password for that web site. If you do not have the password, please call the hospital operator.  04/12/2021, 3:00 PM

## 2021-04-12 NOTE — Consult Note (Signed)
Consult Note  Judy Manning Oct 28, 1965  [id number].    Requesting MD: Dr. Hosie Poisson  Chief Complaint/Reason for Consult: hepatic cysts with RUQ pain   HPI:  Patient is a 55 year old female with known autosomal dominant PCKD and hepatic cysts. Patient admitted 9/8 with 2 weeks of severe RUQ pain radiating to epigastrium, right chest and shoulder, that is intermittently sharp and severe. Pain is worsened by inspiration and movement and improved by lying on left side. She reported associated fever (up to 104), nausea, vomiting, diarrhea. Patient was initially seen in urgent care 8/30 and was given supportive care and discharged. Seen again in ED 9/5 and discharged. She was prescribed a course of azithromycin by PCP but completed the course and had no improvement in symptoms. She was seen by GI due to concern for CBD dilation and possible medication induced cholangitis. Her chlorthalidone was discontinued about 2 weeks prior to presentation. She was started on IV abx and has had improvement in leukocytosis. Patient underwent aspiration of largest hepatic cyst 9/10 and got out ~100 cc of yellow fluid with minimal debris, cxs have had no growth to date. Gallbladder appears unremarkable on several studies and patient does not report association between pain and eating. PMH unremarkable other than above listed. No past abdominal surgery. She is not on any blood thinning medications.   ROS: Review of Systems  Constitutional:  Positive for chills and fever.  Respiratory:  Negative for cough, shortness of breath and wheezing.   Cardiovascular:  Negative for chest pain and palpitations.  Gastrointestinal:  Positive for abdominal pain, diarrhea, nausea and vomiting. Negative for blood in stool, constipation and melena.  Genitourinary:  Negative for dysuria and urgency.  Musculoskeletal:  Positive for back pain and neck pain.   Family History  Problem Relation Age of Onset   Rheum arthritis  Mother    Heart disease Mother        Pacemaker   COPD Mother        Smoker   Skin cancer Mother    Cancer Mother    Stroke Father    Polycystic kidney disease Father    Arthritis Brother    Alzheimer's disease Maternal Grandmother 75   Diabetes Paternal Grandfather    Skin cancer Paternal Grandfather    Cancer Paternal Grandfather    Breast cancer Neg Hx    Colon cancer Neg Hx     Past Medical History:  Diagnosis Date   COVID 12/2020   Polycystic kidney disease     Past Surgical History:  Procedure Laterality Date   AUGMENTATION MAMMAPLASTY      Social History:  reports that she has never smoked. She has never used smokeless tobacco. She reports that she does not currently use alcohol. She reports that she does not use drugs.  Allergies:  Allergies  Allergen Reactions   Imitrex [Sumatriptan] Swelling    Throat gets tight   Ibuprofen Other (See Comments)    Polycystic kidney disease   Nitrofurantoin Rash   Oseltamivir Nausea And Vomiting    Medications Prior to Admission  Medication Sig Dispense Refill   Biotin 10000 MCG TABS Take by mouth.     cetirizine (ZYRTEC) 10 MG tablet Take 10 mg by mouth daily as needed for allergies (alternates with claritin).     chlorthalidone (HYGROTON) 25 MG tablet TAKE 1 TABLET BY MOUTH  DAILY 90 tablet 3   Cholecalciferol (VITAMIN D-3) 5000 units TABS Take by mouth.  Dapsone 5 % topical gel Apply 1 application topically 2 (two) times daily. 60 g 11   gabapentin (NEURONTIN) 300 MG capsule TAKE 1 CAPSULE BY MOUTH  TWICE DAILY 180 capsule 3   JYNARQUE 90 & 30 MG TBPK      Multiple Vitamin (MULTIVITAMIN) tablet Take 1 tablet by mouth daily.     ondansetron (ZOFRAN ODT) 4 MG disintegrating tablet Take 1 tablet (4 mg total) by mouth every 8 (eight) hours as needed for nausea or vomiting. 10 tablet 0   zolpidem (AMBIEN) 10 MG tablet TAKE 1/2 TO 1 TABLET BY  MOUTH AT BEDTIME AS NEEDED  FOR SLEEP 90 tablet 1   tretinoin (RETIN-A) 0.025  % cream Apply topically at bedtime. 45 g 11    Blood pressure (!) 147/83, pulse (!) 104, temperature 98 F (36.7 C), temperature source Oral, resp. rate 19, height 5' (1.524 m), weight 53.5 kg, last menstrual period 03/16/2021, SpO2 97 %. Physical Exam:  General: pleasant, WD, WN female who is laying in bed and appears uncomfortable HEENT: head is normocephalic, atraumatic.  Sclera are anicteric. EOMI.  Ears and nose without any masses or lesions.  Mouth is pink and moist Heart: regular, rate, and rhythm.  Normal s1,s2. No obvious murmurs, gallops, or rubs noted.  Lungs: CTAB, no wheezes, rhonchi, or rales noted.  Respiratory effort nonlabored Abd: firm, ttp along the RUQ, moderately distended MS: all 4 extremities are symmetrical with no cyanosis, clubbing, or edema. Skin: warm and dry with no masses, lesions, or rashes Neuro: Cranial nerves 2-12 grossly intact, sensation is normal throughout Psych: A&Ox3 with an appropriate affect.   Results for orders placed or performed during the hospital encounter of 04/08/21 (from the past 48 hour(s))  Comprehensive metabolic panel     Status: Abnormal   Collection Time: 04/11/21  6:09 AM  Result Value Ref Range   Sodium 134 (L) 135 - 145 mmol/L   Potassium 3.7 3.5 - 5.1 mmol/L   Chloride 98 98 - 111 mmol/L   CO2 27 22 - 32 mmol/L   Glucose, Bld 100 (H) 70 - 99 mg/dL    Comment: Glucose reference range applies only to samples taken after fasting for at least 8 hours.   BUN 9 6 - 20 mg/dL   Creatinine, Ser 1.15 (H) 0.44 - 1.00 mg/dL   Calcium 8.1 (L) 8.9 - 10.3 mg/dL   Total Protein 5.6 (L) 6.5 - 8.1 g/dL   Albumin 2.0 (L) 3.5 - 5.0 g/dL   AST 52 (H) 15 - 41 U/L   ALT 49 (H) 0 - 44 U/L   Alkaline Phosphatase 80 38 - 126 U/L   Total Bilirubin 0.5 0.3 - 1.2 mg/dL   GFR, Estimated 57 (L) >60 mL/min    Comment: (NOTE) Calculated using the CKD-EPI Creatinine Equation (2021)    Anion gap 9 5 - 15    Comment: Performed at [facility], [place] [address] [place], [place] [address]  CBC     Status: Abnormal   Collection Time: 04/11/21  6:09 AM  Result Value Ref Range   WBC 8.8 4.0 - 10.5 K/uL   RBC 3.31 (L) 3.87 - 5.11 MIL/uL   Hemoglobin 9.0 (L) 12.0 - 15.0 g/dL   HCT 27.4 (L) 36.0 - 46.0 %   MCV 82.8 80.0 - 100.0 fL   MCH 27.2 26.0 - 34.0 pg   MCHC 32.8 30.0 - 36.0 g/dL   RDW 13.5 11.5 - 15.5 %  Platelets 403 (H) 150 - 400 K/uL   nRBC 0.0 0.0 - 0.2 %    Comment: Performed at [facility], [place] [address] [place], [place] [address]  Comprehensive metabolic panel     Status: Abnormal   Collection Time: 04/12/21 10:32 AM  Result Value Ref Range   Sodium 132 (L) 135 - 145 mmol/L   Potassium 4.1 3.5 - 5.1 mmol/L   Chloride 92 (L) 98 - 111 mmol/L   CO2 28 22 - 32 mmol/L   Glucose, Bld 98 70 - 99 mg/dL    Comment: Glucose reference range applies only to samples taken after fasting for at least 8 hours.   BUN 7 6 - 20 mg/dL   Creatinine, Ser 1.08 (H) 0.44 - 1.00 mg/dL   Calcium 8.1 (L) 8.9 - 10.3 mg/dL   Total Protein 6.1 (L) 6.5 - 8.1 g/dL   Albumin 2.2 (L) 3.5 - 5.0 g/dL   AST 40 15 - 41 U/L   ALT 47 (H) 0 - 44 U/L   Alkaline Phosphatase 94 38 - 126 U/L   Total Bilirubin 0.5 0.3 - 1.2 mg/dL   GFR, Estimated >60 >60 mL/min    Comment: (NOTE) Calculated using the CKD-EPI Creatinine Equation (2021)    Anion gap 12 5 - 15    Comment: Performed at [facility], [place] [address], [place], [place] [address]  CBC with Differential/Platelet     Status: Abnormal   Collection Time: 04/12/21 10:32 AM  Result Value Ref Range   WBC 10.1 4.0 - 10.5 K/uL   RBC 3.41 (L) 3.87 - 5.11 MIL/uL   Hemoglobin 9.3 (L) 12.0 - 15.0 g/dL   HCT 28.2 (L) 36.0 - 46.0 %   MCV 82.7 80.0 - 100.0 fL   MCH 27.3 26.0 - 34.0 pg   MCHC 33.0 30.0 - 36.0 g/dL   RDW 13.1 11.5 - 15.5 %   Platelets 488 (H) 150 - 400 K/uL   nRBC 0.0 0.0 - 0.2 %   Neutrophils Relative % 86 %   Neutro Abs 8.7 (H) 1.7 -  7.7 K/uL   Lymphocytes Relative 5 %   Lymphs Abs 0.5 (L) 0.7 - 4.0 K/uL   Monocytes Relative 5 %   Monocytes Absolute 0.5 0.1 - 1.0 K/uL   Eosinophils Relative 3 %   Eosinophils Absolute 0.3 0.0 - 0.5 K/uL   Basophils Relative 0 %   Basophils Absolute 0.0 0.0 - 0.1 K/uL   Immature Granulocytes 1 %   Abs Immature Granulocytes 0.06 0.00 - 0.07 K/uL    Comment: Performed at [facility], [place] [address], [place], [place] [address]  Magnesium     Status: Abnormal   Collection Time: 04/12/21 10:32 AM  Result Value Ref Range   Magnesium 1.6 (L) 1.7 - 2.4 mg/dL    Comment: Performed at [facility], [place] [address], [place], [place] [address]   ECHOCARDIOGRAM COMPLETE  Result Date: 04/11/2021    ECHOCARDIOGRAM REPORT   Patient Name:   GEORGANA STROM Date of Exam: 04/11/2021 Medical Rec #:  [id number]        Height:       60.0 in Accession #:    [id number]       Weight:       118.0 lb Date of Birth:  1965/12/29        BSA:          1.492 m Patient Age:  54 years         BP:           123/67 mmHg Patient Gender: F                HR:           106 bpm. Exam Location:  Inpatient Procedure: 2D Echo, Cardiac Doppler and Color Doppler Indications:    Elevated Troponin  History:        Patient has no prior history of Echocardiogram examinations.                 Polycystic kidney disease, hypertension, chronic kidney disease.                 Fevers fatigue and right chest/flank pain.  Sonographer:    Darlina Sicilian RDCS Referring Phys: Theola Sequin  Sonographer Comments: Augemntation Mammaplasty. IMPRESSIONS  1. Left ventricular ejection fraction, by estimation, is 60 to 65%. The left ventricle has normal function. The left ventricle has no regional wall motion abnormalities. Left ventricular diastolic parameters were normal.  2. Right ventricular systolic function is normal. The right ventricular size is normal.  3. The mitral valve is normal in structure. No evidence  of mitral valve regurgitation. No evidence of mitral stenosis.  4. The aortic valve is tricuspid. Aortic valve regurgitation is not visualized. No aortic stenosis is present.  5. The inferior vena cava is normal in size with greater than 50% respiratory variability, suggesting right atrial pressure of 3 mmHg. FINDINGS  Left Ventricle: Left ventricular ejection fraction, by estimation, is 60 to 65%. The left ventricle has normal function. The left ventricle has no regional wall motion abnormalities. The left ventricular internal cavity size was normal in size. There is  no left ventricular hypertrophy. Left ventricular diastolic parameters were normal. Right Ventricle: The right ventricular size is normal. Right ventricular systolic function is normal. Left Atrium: Left atrial size was normal in size. Right Atrium: Right atrial size was normal in size. Pericardium: Trivial pericardial effusion is present. Mitral Valve: The mitral valve is normal in structure. No evidence of mitral valve regurgitation. No evidence of mitral valve stenosis. Tricuspid Valve: The tricuspid valve is normal in structure. Tricuspid valve regurgitation is trivial. No evidence of tricuspid stenosis. Aortic Valve: The aortic valve is tricuspid. Aortic valve regurgitation is not visualized. No aortic stenosis is present. Pulmonic Valve: The pulmonic valve was normal in structure. Pulmonic valve regurgitation is not visualized. No evidence of pulmonic stenosis. Aorta: The aortic root is normal in size and structure. Venous: The inferior vena cava is normal in size with greater than 50% respiratory variability, suggesting right atrial pressure of 3 mmHg. IAS/Shunts: No atrial level shunt detected by color flow Doppler.  LEFT VENTRICLE PLAX 2D LVIDd:         3.80 cm  Diastology LVIDs:         2.60 cm  LV e' medial:    7.15 cm/s LV PW:         0.80 cm  LV E/e' medial:  6.9 LV IVS:        0.90 cm  LV e' lateral:   8.94 cm/s LVOT diam:     1.70 cm   LV E/e' lateral: 5.5 LV SV:         29 LV SV Index:   19 LVOT Area:     2.27 cm  RIGHT VENTRICLE RV S prime:     15.20 cm/s TAPSE (M-mode):  1.9 cm LEFT ATRIUM           Index LA diam:      3.00 cm 2.01 cm/m LA Vol (A4C): 25.6 ml 17.16 ml/m  AORTIC VALVE LVOT Vmax:   75.30 cm/s LVOT Vmean:  47.300 cm/s LVOT VTI:    0.126 m  AORTA Ao Root diam: 2.60 cm Ao Asc diam:  2.70 cm MITRAL VALVE MV Area (PHT): 4.42 cm    SHUNTS MV Decel Time: 172 msec    Systemic VTI:  0.13 m MV E velocity: 49.30 cm/s  Systemic Diam: 1.70 cm MV A velocity: 51.00 cm/s MV E/A ratio:  0.97 Kirk Ruths MD Electronically signed by Kirk Ruths MD Signature Date/Time: 04/11/2021/1:54:05 PM    Final       Assessment/Plan RUQ pain with n/v Fever Hepatic cysts vs abscesses - s/p IR aspiration 9/10, cx with NGTD - leukocytosis improved on IV abx - LFTs mildly elevated, Tbili WNL, lipase normal on admit - no mention of gallstones and not significant appearing for cholecystitis  - GI following as well and considering if sclerosant therapy per IR would be helpful vs repeat aspiration of cysts possibly tomorrow  - no indication for emergent surgical intervention but will ask [facility] surgeons to review and see if fenestration of hepatic cysts indicated or if they have any other recommendations. Will follow up tomorrow.   FEN: reg diet  VTE: LMWH ID: [id number]>>   PCKD  Norm Parcel, [facility] Surgery 04/12/2021, 4:41 PM Please see Amion for pager number during day hours 7:00am-4:30pm

## 2021-04-12 NOTE — Progress Notes (Signed)
Subjective: Patient states pain is worse today than yesterday, still has PCA pump. Pain is worse RUQ. Patient was given dulcolax suppository last night, since that time she had 4-5 loose bowel movements, urgent without notice.  Tried to sit on toilet but had dizziness, currently using bed pan.  She still has ab distention, mild lower AB cramping.  She has more nausea today, has not had any food today.    Objective: Vital signs in last 24 hours: Temp:  [98 F (36.7 C)-98.2 F (36.8 C)] 98 F (36.7 C) (09/12 0620) Pulse Rate:  [95-117] 104 (09/12 0632) Resp:  [17-25] 19 (09/12 0620) BP: (125-147)/(63-83) 147/83 (09/12 0620) SpO2:  [92 %-98 %] 97 % (09/12 0947) Weight change:  Last BM Date: 04/08/21  PE: Continues to be in distress due to pain GENERAL: Anxious ABDOMEN: Distended, mild generalized tenderness worse RUQ/epigastirc, normoactive bowel sounds EXTREMITIES: No deformity, no edema  Lab Results: Results for orders placed or performed during the hospital encounter of 04/08/21 (from the past 48 hour(s))  Aerobic/Anaerobic Culture w Gram Stain (surgical/deep wound)     Status: None (Preliminary result)   Collection Time: 04/10/21  2:18 PM   Specimen: Liver; Abscess  Result Value Ref Range   Specimen Description      LIVER CYSTS FLUID Performed at [facility] [address] [place], [place] [address]    Special Requests      NONE Performed at [facility], [place] [address], [place], [place] [address]    Gram Stain      FEW SQUAMOUS EPITHELIAL CELLS PRESENT FEW WBC SEEN NO ORGANISMS SEEN    Culture      NO GROWTH < 24 HOURS Performed at [facility], [place] [address], [place], [place] [address]    Report Status PENDING   Magnesium     Status: Abnormal   Collection Time: 04/10/21  3:10 PM  Result Value Ref Range   Magnesium 1.5 (L) 1.7 - 2.4 mg/dL    Comment: Performed at [facility], [place] [address] [place], [place] [address]  Comprehensive metabolic panel     Status: Abnormal   Collection Time: 04/11/21  6:09 AM  Result Value Ref Range   Sodium 134 (L) 135 - 145 mmol/L   Potassium 3.7 3.5 - 5.1 mmol/L   Chloride 98 98 - 111 mmol/L   CO2 27 22 - 32 mmol/L   Glucose, Bld 100 (H) 70 - 99 mg/dL    Comment: Glucose reference range applies only to samples taken after fasting for at least 8 hours.   BUN 9 6 - 20 mg/dL   Creatinine, Ser 1.15 (H) 0.44 - 1.00 mg/dL   Calcium 8.1 (L) 8.9 - 10.3 mg/dL   Total Protein 5.6 (L) 6.5 - 8.1 g/dL   Albumin 2.0 (L) 3.5 - 5.0 g/dL   AST 52 (H) 15 - 41 U/L   ALT 49 (H) 0 - 44 U/L   Alkaline Phosphatase 80 38 - 126 U/L   Total Bilirubin 0.5 0.3 - 1.2 mg/dL   GFR, Estimated 57 (L) >60 mL/min    Comment: (NOTE) Calculated using the CKD-EPI Creatinine Equation (2021)    Anion gap 9 5 - 15    Comment: Performed at [facility], [place] [address], [place], [place] [address]  CBC     Status: Abnormal   Collection Time: 04/11/21  6:09 AM  Result Value Ref Range   WBC  8.8 4.0 - 10.5 K/uL   RBC 3.31 (L) 3.87 - 5.11 MIL/uL   Hemoglobin 9.0 (L) 12.0 - 15.0 g/dL   HCT 27.4 (L) 36.0 - 46.0 %   MCV 82.8 80.0 - 100.0 fL   MCH 27.2 26.0 - 34.0 pg   MCHC 32.8 30.0 - 36.0 g/dL   RDW 13.5 11.5 - 15.5 %   Platelets 403 (H) 150 - 400 K/uL   nRBC 0.0 0.0 - 0.2 %    Comment: Performed at [facility], [place] [address] [place], [place] [address]    Studies/Results: ECHOCARDIOGRAM COMPLETE  Result Date: 04/11/2021    ECHOCARDIOGRAM REPORT   Patient Name:   MARYUM SLAVEY Date of Exam: 04/11/2021 Medical Rec #:  [id number]        Height:       60.0 in Accession #:    [id number]       Weight:       118.0 lb Date of Birth:  1965-08-30        BSA:          1.492 m Patient Age:    55 years         BP:           123/67 mmHg Patient Gender: F                HR:           106 bpm. Exam Location:  Inpatient Procedure: 2D Echo, Cardiac  Doppler and Color Doppler Indications:    Elevated Troponin  History:        Patient has no prior history of Echocardiogram examinations.                 Polycystic kidney disease, hypertension, chronic kidney disease.                 Fevers fatigue and right chest/flank pain.  Sonographer:    Darlina Sicilian RDCS Referring Phys: Theola Sequin  Sonographer Comments: Augemntation Mammaplasty. IMPRESSIONS  1. Left ventricular ejection fraction, by estimation, is 60 to 65%. The left ventricle has normal function. The left ventricle has no regional wall motion abnormalities. Left ventricular diastolic parameters were normal.  2. Right ventricular systolic function is normal. The right ventricular size is normal.  3. The mitral valve is normal in structure. No evidence of mitral valve regurgitation. No evidence of mitral stenosis.  4. The aortic valve is tricuspid. Aortic valve regurgitation is not visualized. No aortic stenosis is present.  5. The inferior vena cava is normal in size with greater than 50% respiratory variability, suggesting right atrial pressure of 3 mmHg. FINDINGS  Left Ventricle: Left ventricular ejection fraction, by estimation, is 60 to 65%. The left ventricle has normal function. The left ventricle has no regional wall motion abnormalities. The left ventricular internal cavity size was normal in size. There is  no left ventricular hypertrophy. Left ventricular diastolic parameters were normal. Right Ventricle: The right ventricular size is normal. Right ventricular systolic function is normal. Left Atrium: Left atrial size was normal in size. Right Atrium: Right atrial size was normal in size. Pericardium: Trivial pericardial effusion is present. Mitral Valve: The mitral valve is normal in structure. No evidence of mitral valve regurgitation. No evidence of mitral valve stenosis. Tricuspid Valve: The tricuspid valve is normal in structure. Tricuspid valve regurgitation is trivial. No evidence of  tricuspid stenosis. Aortic Valve: The aortic valve is tricuspid. Aortic valve regurgitation is  not visualized. No aortic stenosis is present. Pulmonic Valve: The pulmonic valve was normal in structure. Pulmonic valve regurgitation is not visualized. No evidence of pulmonic stenosis. Aorta: The aortic root is normal in size and structure. Venous: The inferior vena cava is normal in size with greater than 50% respiratory variability, suggesting right atrial pressure of 3 mmHg. IAS/Shunts: No atrial level shunt detected by color flow Doppler.  LEFT VENTRICLE PLAX 2D LVIDd:         3.80 cm  Diastology LVIDs:         2.60 cm  LV e' medial:    7.15 cm/s LV PW:         0.80 cm  LV E/e' medial:  6.9 LV IVS:        0.90 cm  LV e' lateral:   8.94 cm/s LVOT diam:     1.70 cm  LV E/e' lateral: 5.5 LV SV:         29 LV SV Index:   19 LVOT Area:     2.27 cm  RIGHT VENTRICLE RV S prime:     15.20 cm/s TAPSE (M-mode): 1.9 cm LEFT ATRIUM           Index LA diam:      3.00 cm 2.01 cm/m LA Vol (A4C): 25.6 ml 17.16 ml/m  AORTIC VALVE LVOT Vmax:   75.30 cm/s LVOT Vmean:  47.300 cm/s LVOT VTI:    0.126 m  AORTA Ao Root diam: 2.60 cm Ao Asc diam:  2.70 cm MITRAL VALVE MV Area (PHT): 4.42 cm    SHUNTS MV Decel Time: 172 msec    Systemic VTI:  0.13 m MV E velocity: 49.30 cm/s  Systemic Diam: 1.70 cm MV A velocity: 51.00 cm/s MV E/A ratio:  0.97 Kirk Ruths MD Electronically signed by Kirk Ruths MD Signature Date/Time: 04/11/2021/1:54:05 PM    Final    [place] FINE NEEDLE ASP 1ST LESION  Result Date: 04/10/2021 INDICATION: 55 year old female referred for aspiration of liver cyst EXAM: IMAGE GUIDED ASPIRATION RIGHT LIVER CYST MEDICATIONS: None ANESTHESIA/SEDATION: Fentanyl 100 mcg IV; Versed 2.0 mg IV Moderate Sedation Time:  16 minutes The patient was continuously monitored during the procedure by the interventional radiology nurse under my direct supervision. COMPLICATIONS: None PROCEDURE: Informed written consent was obtained from  the patient after a thorough discussion of the procedural risks, benefits and alternatives. All questions were addressed. Maximal Sterile Barrier Technique was utilized including caps, mask, sterile gowns, sterile gloves, sterile drape, hand hygiene and skin antiseptic. A timeout was performed prior to the initiation of the procedure. Ultrasound survey of the right liver lobe performed with images stored and sent to PACs. The right lower thorax/right upper abdomen was prepped with chlorhexidine in a sterile fashion, and a sterile drape was applied covering the operative field. A sterile gown and sterile gloves were used for the procedure. Local anesthesia was provided with 1% Lidocaine. The patient was prepped and draped sterilely and the skin and subcutaneous tissues were generously infiltrated with 1% lidocaine. Small stab incision was made with 11 blade scalpel and gentle soft tissue spreading was performed with a hemostat. A 10 French drain was then advanced with ultrasound guidance via trocar technique into the targeted large cyst of the right hepatic dome. Drain was advanced into position and the needle was removed. We then aspirated approximally 100 cc of thin yellow fluid with minimal debris. Sample was sent for culture. After aspiration of the fluid, the drain was removed.  Final ultrasound image was performed. The patient tolerated the procedure well and remained hemodynamically stable throughout. No complications were encountered and no significant blood loss was encounter. IMPRESSION: Status post ultrasound-guided aspiration of right hepatic dome cyst. Signed, Dulcy Fanny. Dellia Nims, RPVI [facility] Electronically Signed   By: Corrie Mckusick D.O.   On: 04/10/2021 15:04    Medications: I have reviewed the patient's current medications.  Assessment: Polycystic liver disease, large cysts with internal debris Status post right hepatic dome cyst  drainage by IR with aspiration of 100 cc of thin yellow fluid performed 04/10/21  On Dilaudid PCA pump for pain control along with gabapentin 300 mg twice a day and tramadol 100 mg every 6 hours as needed for moderate pain  On IV Zosyn, empiric treatment for possible infected cyst Improvement in WBC, now normal at 8.8, mild anemia hemoglobin 9 with elevated platelet of 403 pending labs from 04/12/21  Polycystic kidney disease, creatinine 1.15 and GFR 57 yesterday Patient continues to be in pain, worsening today, still on PCA pump for pain control. Patient unable to get out of bed to chair or ambulate due to dizziness and pain.   Constipation and AB distended Has had several BM's, AB still with distention  Plan: Advance diet to regular if tolerated. Zofran as needed for nausea Dulcolax suppository 10 mg once or twice daily Miralax 17 grams twice a day  Continue to monitor, consider repeat imaging. Recommend continued pain management for better pain control, eventually patient will need early and aggressive ambulation to prevent other complications. Patient may require drainage of other liver cysts if she continues to remain symptomatic. As an outpatient,once pain is adequately controlled, patient will need to be referred to outpatient tertiary transplant center for evaluation for liver and kidney transplant.   Vladimir Crofts, PA-C  04/12/2021, 10:01 AM

## 2021-04-13 ENCOUNTER — Inpatient Hospital Stay (HOSPITAL_COMMUNITY): Payer: 59

## 2021-04-13 LAB — CBC
HCT: 27.7 % — ABNORMAL LOW (ref 36.0–46.0)
Hemoglobin: 9 g/dL — ABNORMAL LOW (ref 12.0–15.0)
MCH: 26.9 pg (ref 26.0–34.0)
MCHC: 32.5 g/dL (ref 30.0–36.0)
MCV: 82.7 fL (ref 80.0–100.0)
Platelets: 514 10*3/uL — ABNORMAL HIGH (ref 150–400)
RBC: 3.35 MIL/uL — ABNORMAL LOW (ref 3.87–5.11)
RDW: 13.2 % (ref 11.5–15.5)
WBC: 7.2 10*3/uL (ref 4.0–10.5)
nRBC: 0 % (ref 0.0–0.2)

## 2021-04-13 LAB — BASIC METABOLIC PANEL
Anion gap: 9 (ref 5–15)
BUN: 5 mg/dL — ABNORMAL LOW (ref 6–20)
CO2: 31 mmol/L (ref 22–32)
Calcium: 8.5 mg/dL — ABNORMAL LOW (ref 8.9–10.3)
Chloride: 98 mmol/L (ref 98–111)
Creatinine, Ser: 1.06 mg/dL — ABNORMAL HIGH (ref 0.44–1.00)
GFR, Estimated: >60 mL/min (ref >60)
Glucose, Bld: 87 mg/dL (ref 70–99)
Potassium: 4.4 mmol/L (ref 3.5–5.1)
Sodium: 138 mmol/L (ref 135–145)

## 2021-04-13 LAB — MAGNESIUM: Magnesium: 2.2 mg/dL (ref 1.7–2.4)

## 2021-04-13 LAB — CULTURE, BLOOD (ROUTINE X 2)
Culture: NO GROWTH
Culture: NO GROWTH
Special Requests: ADEQUATE
Special Requests: ADEQUATE

## 2021-04-13 LAB — PROTIME-INR
INR: 1.1 (ref 0.8–1.2)
Prothrombin Time: 14.6 seconds (ref 11.4–15.2)

## 2021-04-13 IMAGING — US US FINE NEEDLE ASP 1ST LESION
1 series · 13 of 13 positions shown · non-contrast
Comparison: CT pelvis, [DATE].

INDICATION: ADPKD, with symptomatic RIGHT renal cyst.

EXAM:
ULTRASOUND GUIDED RIGHT RENAL CYST ASPIRATION
TECHNIQUE: Informed written consent was obtained from the patient after a
discussion of the risks, benefits and alternatives to treatment.
Questions regarding the procedure were encouraged and answered. A
timeout was performed prior to the initiation of the procedure.

[Series 1: us fna 1st lesion mc & wl · 13 of 13 slices shown]
[im 1/13]
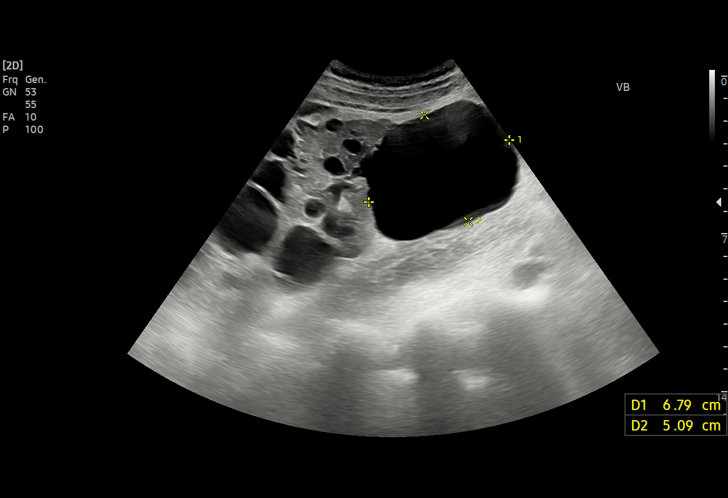
[im 2/13]
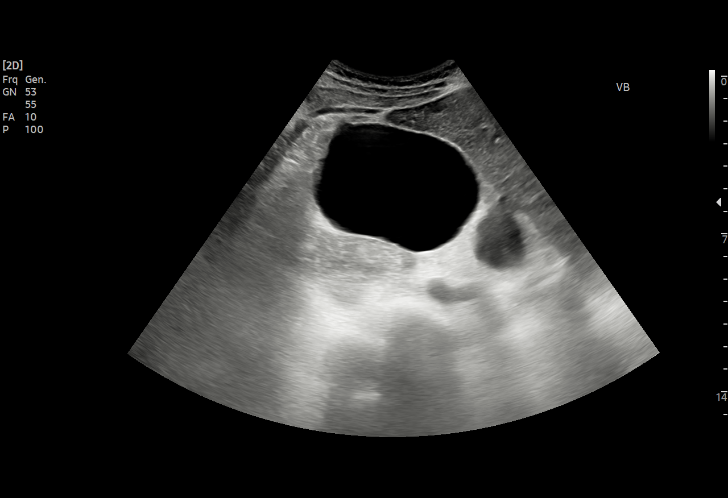
[im 3/13]
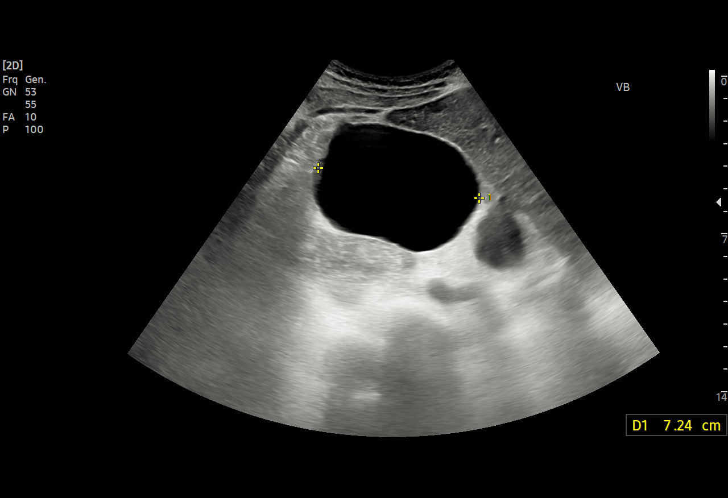
[im 4/13]
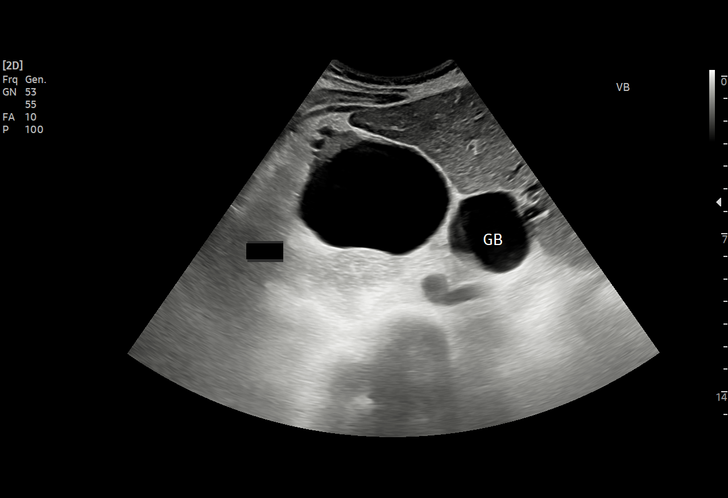
[im 5/13]
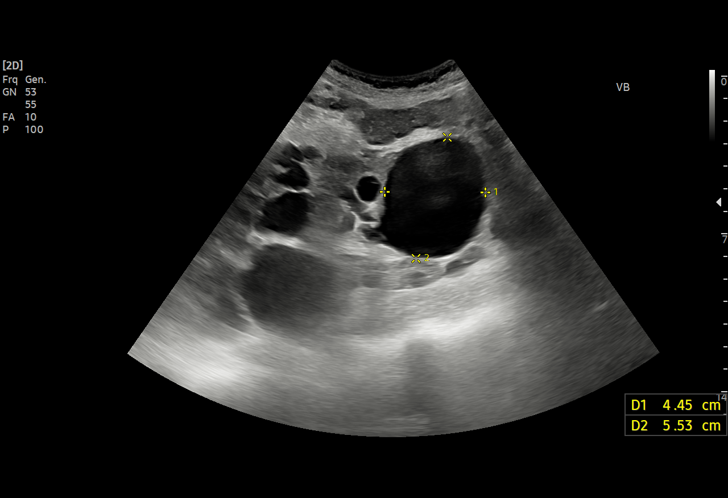
[im 6/13]
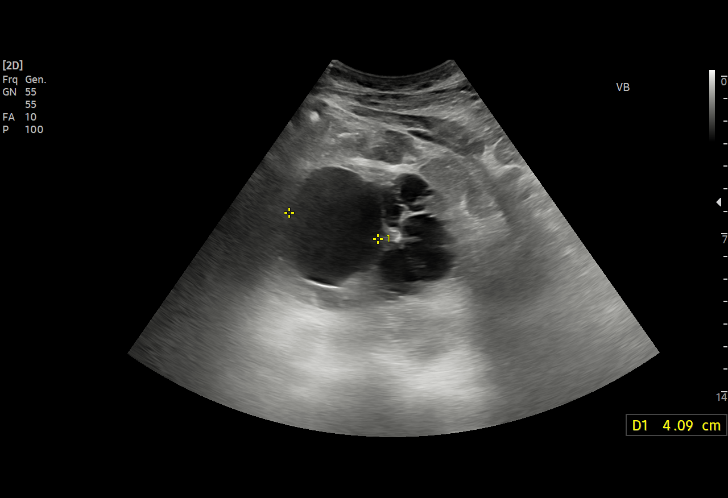
[im 7/13]
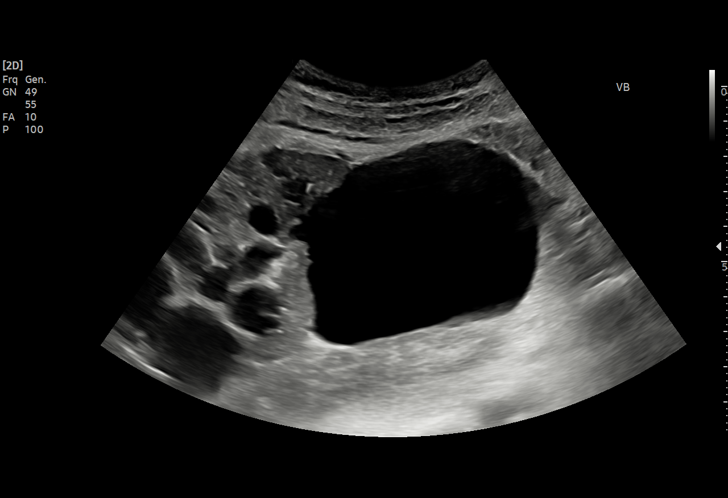
[im 8/13]
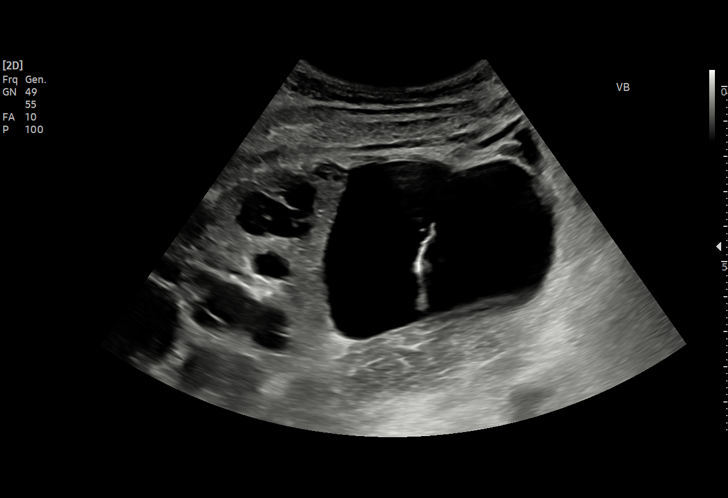
[im 9/13]
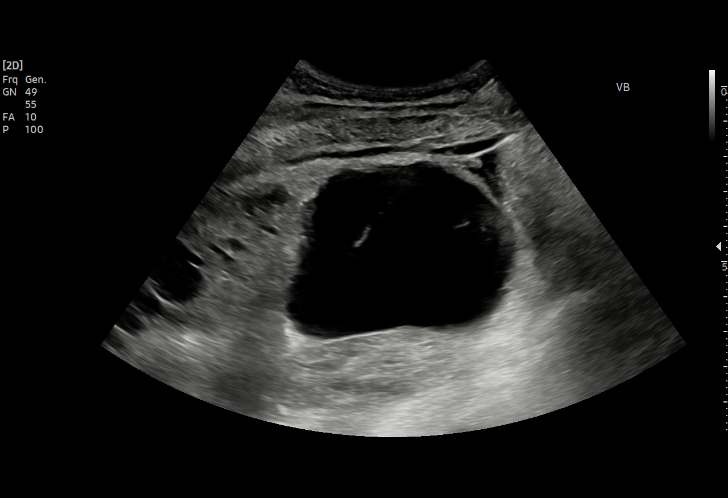
[im 10/13]
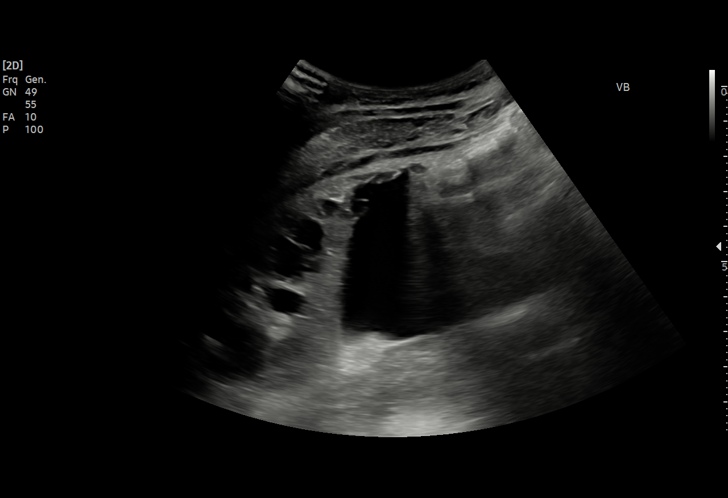
[im 11/13]
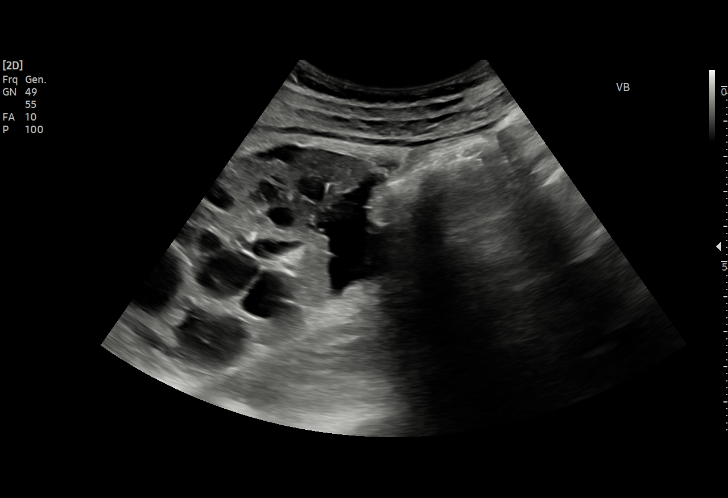
[im 12/13]
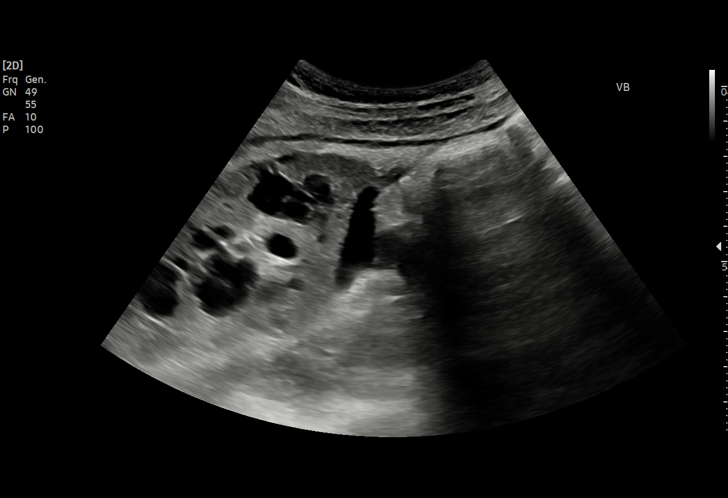
[im 13/13]
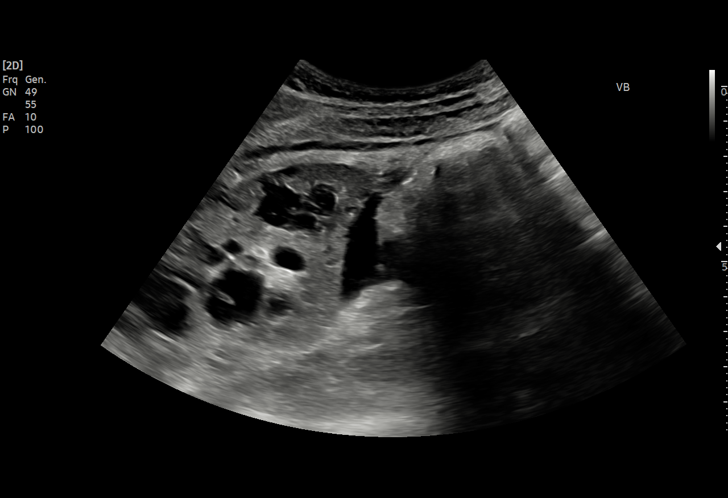

[13 of 13 positions shown; findings below may reference images not displayed]

MR abdomen, [DATE]. Renal
ultrasound, [DATE]

MEDICATIONS:
Versed 2 mg IV; Fentanyl 100 mcg IV

ANESTHESIA/SEDATION:
Total Moderate Sedation Time

13 minutes

COMPLICATIONS:
None immediate
The patient was positioned supine, with her RIGHT side wedged. The
RIGHT flank was prepped and draped in the usual sterile fashion, and
a sterile drape was applied covering the operative field. Maximum
barrier sterile technique with sterile gowns and gloves were used
for the procedure. A timeout was performed prior to the initiation
of the procedure.

Initial ultrasound scanning redemonstrated multi-cystic RIGHT kidney
with dominant RIGHT inferior renal pole septated cyst.

The dominant RIGHT inferior pole renal cyst was aspirated with a
5-French multi JOSAN needle after the overlying soft
tissues was anesthetized with 1% lidocaine with epinephrine. A total
of approximately 120 ml of serous fluid was aspirated.

Postprocedural ultrasound imaging demonstrated decompression of the
dominant inferior pole cyst with innumerable renal cysts remaining
bilaterally. The patient tolerated the procedure well without
immediate postprocedural complication.
FINDINGS: Multi-cystic RIGHT kidney with dominant RIGHT inferior pole cyst,
with appreciable mass effect
IMPRESSION: Technically successful dominant RIGHT renal cyst aspiration under
ultrasound guidance, as above.

## 2021-04-13 MED ORDER — MIDAZOLAM HCL 2 MG/2ML IJ SOLN
INTRAMUSCULAR | Status: AC
  Filled 2021-04-13: qty 2

## 2021-04-13 MED ORDER — FENTANYL CITRATE (PF) 100 MCG/2ML IJ SOLN
INTRAMUSCULAR | Status: DC | PRN
  Administered 2021-04-13: 50 ug via INTRAVENOUS

## 2021-04-13 MED ORDER — LIDOCAINE HCL (PF) 1 % IJ SOLN
INTRAMUSCULAR | Status: DC | PRN
  Administered 2021-04-13: 10 mL via INTRADERMAL

## 2021-04-13 MED ORDER — MIDAZOLAM HCL 2 MG/2ML IJ SOLN
INTRAMUSCULAR | Status: DC | PRN
  Administered 2021-04-13: 1 mg via INTRAVENOUS

## 2021-04-13 MED ORDER — PANTOPRAZOLE SODIUM 40 MG IV SOLR
40.0000 mg | INTRAVENOUS | Status: DC
  Administered 2021-04-13 – 2021-04-14 (×2): 40 mg via INTRAVENOUS
  Filled 2021-04-13 (×2): qty 40

## 2021-04-13 MED ORDER — FENTANYL CITRATE (PF) 100 MCG/2ML IJ SOLN
INTRAMUSCULAR | Status: AC
  Filled 2021-04-13: qty 2

## 2021-04-13 MED ORDER — SIMETHICONE 80 MG PO CHEW
80.0000 mg | CHEWABLE_TABLET | Freq: Four times a day (QID) | ORAL | Status: DC | PRN
  Administered 2021-04-13: 80 mg via ORAL
  Filled 2021-04-13: qty 1

## 2021-04-13 MED ORDER — LIDOCAINE HCL 1 % IJ SOLN
INTRAMUSCULAR | Status: AC
  Filled 2021-04-13: qty 20

## 2021-04-13 NOTE — Progress Notes (Signed)
PROGRESS NOTE    Judy Manning  [id number] DOB: Mar 15, 1966 DOA: 04/08/2021 PCP: Eunice Blase, MD   Chief Complaint  Patient presents with   Fever   Pain    Brief Narrative:  55 year old lady with prior history of polycystic kidney disease, hypertension, migraine headaches presents with right flank pain right-sided abdominal pain started about 2 weeks ago associated with fevers and some nausea and vomiting.  She was recently seen in urgent care on 03/30/2021 was given a prescription for azithromycin and sent home.  Patient reports her symptoms did not improve.  She underwent an MRI of the abdomen which showed mildly dilated common bile duct of 8 mm remains febrile with leukocytosis.   Pt has been in severe pain in the RUQ and right flank pain without much improvement on morphine PCA, changed to dilaudid PCA, associated with nausea possibly from the PCA.   Assessment & Plan:   Principal Problem:   Right upper quadrant pain Active Problems:   Polycystic kidney disease   SIRS (systemic inflammatory response syndrome) (HCC)   Common bile duct dilatation   History of migraine headaches   Hypokalemia, inadequate intake   Hyponatremia   Right upper quadrant pain/ right flank pain.  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 Some of the larger cyst demonstrate varying degrees of complexity, including the largest lesion involving portions of segments 7 and 8 (axial image 9 of series 15 and coronal image 20 of series 5) which measures 8.4 x 7.6 x 7.3 cm and has several internal septations measuring up to 4 mm in thickness, which demonstrates some low-level areas of enhancement. One of the loculations of the cyst also contains some dependent T1 hyperintense material  most compatible with a proteinaceous/hemorrhagic debris. CBD dilated at 11 mm  at porta hepatis.   IR Consulted and she underwent aspiration of one liver cyst and along with PCA morphine, she has some relief from the abdominal pain. We had to change morphine PCA to Dilaudid PCA as she reports pain persistently greater than 5 out of 10.  She was encouraged to use tramadol in addition to PCA.  Patient adamantly refused any oral narcotic medication at this time as she reports nausea to codeine containing pain medications.  In view of persistent abdominal pain, Surgery consulted for cyst fenestration, input given.  IR re consulted by GI to see if she need aspiration of the renal cyst .  She was empirically started on broad-spectrum IV antibiotics in view of her persistent fevers which will be continued as she improves with normalization of wbc count, remained afebrile since initiating antibiotics. Cultures from the cyst aspiration are negative so far.  Blood cultures are negative.  UA is not sig for UTI.      SIRS: Improving, blood cultures have been negative so far.   PKD:  Pt reports she has been off Jynarque/ Tolvapton since 2 weeks, since the onset of fevers. She follows up with Dr Justin Mend outpatient.  Nephrology signed off.    Hypomagnesemia and hypokalemia Replaced.    Elevated troponin  Suspect from demand ischemia from SIRS.  Repeat EKG shows sinus tachycardia without any ischemic changes. She denies any chest pain.  Echocardiogram unremarkable.    Stage 2 CKD Renal parameters have improved.    Normocytic anemia:  ? Blood loss in to the cytic lesions, baseline hemoglobin between 11  to 12.  Dropped to 10 to 9.5 and to 9 today.  Continue to monitor.   Thrombocytosis; - reactive.    DVT prophylaxis: (lovenox. ) Code Status: (Full Code) Family Communication: family at bedside.  Disposition:   Status is: Inpatient  Remains inpatient appropriate because:Ongoing diagnostic testing needed not appropriate for outpatient work up, Unsafe d/c plan, and IV  treatments appropriate due to intensity of illness or inability to take PO  Dispo: The patient is from: Home              Anticipated d/c is to: Home              Patient currently is not medically stable to d/c.   Difficult to place patient No       Consultants:  Gastroenterology Nephrology.  IR  Procedures: none.   Antimicrobials:   Antibiotics Given (last 72 hours)     Date/Time Action Medication Dose Rate   04/10/21 1615 New Bag/Given   piperacillin-tazobactam (ZOSYN) IVPB 3.375 g 3.375 g 12.5 mL/hr   04/10/21 2355 New Bag/Given   piperacillin-tazobactam (ZOSYN) IVPB 3.375 g 3.375 g 12.5 mL/hr   04/11/21 0743 New Bag/Given   piperacillin-tazobactam (ZOSYN) IVPB 3.375 g 3.375 g 12.5 mL/hr   04/11/21 1600 New Bag/Given   piperacillin-tazobactam (ZOSYN) IVPB 3.375 g 3.375 g 12.5 mL/hr   04/11/21 2351 New Bag/Given   piperacillin-tazobactam (ZOSYN) IVPB 3.375 g 3.375 g 12.5 mL/hr   04/12/21 0801 New Bag/Given   piperacillin-tazobactam (ZOSYN) IVPB 3.375 g 3.375 g 12.5 mL/hr   04/12/21 1528 New Bag/Given   piperacillin-tazobactam (ZOSYN) IVPB 3.375 g 3.375 g 12.5 mL/hr   04/12/21 2326 New Bag/Given   piperacillin-tazobactam (ZOSYN) IVPB 3.375 g 3.375 g 12.5 mL/hr   04/13/21 [id number] New Bag/Given   piperacillin-tazobactam (ZOSYN) IVPB 3.375 g 3.375 g 12.5 mL/hr         Subjective: Pt reports she feels same as yesterday. No chest pain or sob.still nauseated.  She is able to sit upright and tolerate pain.  Objective: Vitals:   04/13/21 1141 04/13/21 1316 04/13/21 1325 04/13/21 1330  BP:  130/64 120/69 119/67  Pulse:  81 89 82  Resp: 18 19 (!) 21 17  Temp:      TempSrc:      SpO2: 95% 100% 96% 96%  Weight:      Height:        Intake/Output Summary (Last 24 hours) at 04/13/2021 1410 Last data filed at 04/13/2021 1148 Gross per 24 hour  Intake 1208.97 ml  Output 2200 ml  Net -991.03 ml    Filed Weights   04/08/21 1120  Weight: 53.5 kg     Examination:  General exam: well developed lady, in mild distress from abd pain. On PCA . Respiratory system: Clear to auscultation. Respiratory effort normal. Cardiovascular system: S1 & S2 heard, RRR. No JVD, murmurs,  No pedal edema. Gastrointestinal system: Abdomen is Soft distended, moderate tenderness in the RUQ and right flank pain.  Central nervous system: Alert and oriented. No focal neurological deficits. Extremities: no pedal edema.  Skin: No rashes, lesions or ulcers Psychiatry: Mood & affect appropriate.    Data Reviewed: I have personally reviewed following labs and imaging studies  CBC: Recent Labs  Lab 04/08/21 1211 04/08/21 1214 04/09/21 0532 04/10/21 0925 04/11/21 0609 04/12/21 1032 04/13/21 0441  WBC 13.6*  --  13.6* 12.6* 8.8 10.1 7.2  NEUTROABS 11.7*  --  11.9* 11.2*  --  8.7*  --  HGB 10.6*   < > 10.1* 9.5* 9.0* 9.3* 9.0*  HCT 32.7*   < > 31.1* 28.7* 27.4* 28.2* 27.7*  MCV 81.8  --  82.5 82.2 82.8 82.7 82.7  PLT 390  --  409* 415* 403* 488* 514*   < > = values in this interval not displayed.     Basic Metabolic Panel: Recent Labs  Lab 04/09/21 0532 04/10/21 0925 04/10/21 1510 04/11/21 0609 04/12/21 1032 04/13/21 0441  NA 139 136  --  134* 132* 138  K 4.3 3.8  --  3.7 4.1 4.4  CL 102 100  --  98 92* 98  CO2 25 28  --  '27 28 31  '$ GLUCOSE 82 173*  --  100* 98 87  BUN 15 11  --  9 7 5*  CREATININE 1.27* 1.26*  --  1.15* 1.08* 1.06*  CALCIUM 8.5* 8.1*  --  8.1* 8.1* 8.5*  MG 1.4*  --  1.5*  --  1.6* 2.2     GFR: Estimated Creatinine Clearance: 44.6 mL/min (A) (by C-G formula based on SCr of 1.06 mg/dL (H)).  Liver Function Tests: Recent Labs  Lab 04/08/21 1211 04/09/21 0532 04/10/21 0925 04/11/21 0609 04/12/21 1032  AST 45* 44* 73* 52* 40  ALT 32 34 56* 49* 47*  ALKPHOS 106 92 98 80 94  BILITOT 0.8 0.9 0.6 0.5 0.5  PROT 7.6 6.2* 6.0* 5.6* 6.1*  ALBUMIN 3.5 2.2* 2.2* 2.0* 2.2*     CBG: No results for input(s): GLUCAP in  the last 168 hours.   Recent Results (from the past 240 hour(s))  Culture, blood (routine x 2)     Status: None   Collection Time: 04/05/21  6:40 PM   Specimen: BLOOD  Result Value Ref Range Status   Specimen Description   Final    BLOOD RIGHT ANTECUBITAL Performed at [facility], [address] [place], [place] [address]    Special Requests   Final    Blood Culture adequate volume BOTTLES DRAWN AEROBIC AND ANAEROBIC Performed at [facility], [address] [place], [place] [address]    Culture   Final    NO GROWTH 5 DAYS Performed at [facility], [employer] [address], [place], [place] [address]    Report Status 04/10/2021 FINAL  Final  Culture, blood (routine x 2)     Status: None   Collection Time: 04/05/21  6:55 PM   Specimen: BLOOD  Result Value Ref Range Status   Specimen Description   Final    BLOOD LEFT ANTECUBITAL Performed at [facility], [address], [place], [place] [address]    Special Requests   Final    Blood Culture adequate volume BOTTLES DRAWN AEROBIC AND ANAEROBIC Performed at [facility], [address] [place], [place] [address]    Culture   Final    NO GROWTH 5 DAYS Performed at [facility], [place] [address], [place], [place] [address]    Report Status 04/10/2021 FINAL  Final  Blood culture (routine x 2)     Status: None   Collection Time: 04/08/21 12:00 PM   Specimen: BLOOD RIGHT FOREARM  Result Value Ref Range Status   Specimen Description   Final    BLOOD RIGHT FOREARM Performed at [facility], [address] [place], [place] [address]    Special Requests   Final    BOTTLES DRAWN AEROBIC AND ANAEROBIC Blood Culture adequate volume  Culture   Final    NO GROWTH 5 DAYS Performed at [facility], [place] [address], [place], [place] [address]    Report Status 04/13/2021 FINAL  Final  Blood culture (routine x 2)     Status:  None   Collection Time: 04/08/21 12:09 PM   Specimen: BLOOD  Result Value Ref Range Status   Specimen Description BLOOD LEFT ANTECUBITAL  Final   Special Requests   Final    BOTTLES DRAWN AEROBIC AND ANAEROBIC Blood Culture adequate volume   Culture   Final    NO GROWTH 5 DAYS Performed at [facility], [place] [address], [place], [place] [address]    Report Status 04/13/2021 FINAL  Final  Resp Panel by RT-PCR (Flu A&B, Covid) Nasopharyngeal Swab     Status: None   Collection Time: 04/08/21  5:38 PM   Specimen: Nasopharyngeal Swab; Nasopharyngeal(NP) swabs in vial transport medium  Result Value Ref Range Status   SARS Coronavirus 2 by RT PCR NEGATIVE NEGATIVE Final    Comment: (NOTE) SARS-CoV-2 target nucleic acids are NOT DETECTED.  The SARS-CoV-2 RNA is generally detectable in upper respiratory specimens during the acute phase of infection. The lowest concentration of SARS-CoV-2 viral copies this assay can detect is 138 copies/mL. A negative result does not preclude SARS-Cov-2 infection and should not be used as the sole basis for treatment or other patient management decisions. A negative result may occur with  improper specimen collection/handling, submission of specimen other than nasopharyngeal swab, presence of viral mutation(s) within the areas targeted by this assay, and inadequate number of viral copies(<138 copies/mL). A negative result must be combined with clinical observations, patient history, and epidemiological information. The expected result is Negative.  Fact Sheet for Patients:  EntrepreneurPulse.com.au  Fact Sheet for Healthcare Providers:  IncredibleEmployment.be  This test is no t yet approved or cleared by the [place] FDA and  has been authorized for detection and/or diagnosis of SARS-CoV-2 by FDA under an Emergency Use Authorization (EUA). This EUA will remain  in effect (meaning this test can be used) for  the duration of the COVID-19 declaration under Section 564(b)(1) of the Act, 21 U.S.C.section 360bbb-3(b)(1), unless the authorization is terminated  or revoked sooner.       Influenza A by PCR NEGATIVE NEGATIVE Final   Influenza B by PCR NEGATIVE NEGATIVE Final    Comment: (NOTE) The Xpert Xpress SARS-CoV-2/FLU/RSV plus assay is intended as an aid in the diagnosis of influenza from Nasopharyngeal swab specimens and should not be used as a sole basis for treatment. Nasal washings and aspirates are unacceptable for Xpert Xpress SARS-CoV-2/FLU/RSV testing.  Fact Sheet for Patients: EntrepreneurPulse.com.au  Fact Sheet for Healthcare Providers: IncredibleEmployment.be  This test is not yet approved or cleared by the [place] FDA and has been authorized for detection and/or diagnosis of SARS-CoV-2 by FDA under an Emergency Use Authorization (EUA). This EUA will remain in effect (meaning this test can be used) for the duration of the COVID-19 declaration under Section 564(b)(1) of the Act, 21 U.S.C. section 360bbb-3(b)(1), unless the authorization is terminated or revoked.  Performed at [facility], [address], [place], [place] [address]   MRSA Next Gen by PCR, Nasal     Status: None   Collection Time: 04/09/21 12:27 PM   Specimen: Nasal Mucosa; Nasal Swab  Result Value Ref Range Status   MRSA by PCR Next Gen NOT DETECTED NOT DETECTED Final    Comment: (  NOTE) The GeneXpert MRSA Assay (FDA approved for NASAL specimens only), is one component of a comprehensive MRSA colonization surveillance program. It is not intended to diagnose MRSA infection nor to guide or monitor treatment for MRSA infections. Test performance is not FDA approved in patients less than 73 years old. Performed at [facility], [place] [address], [place], [place] [address]   Aerobic/Anaerobic Culture w Gram Stain (surgical/deep  wound)     Status: None (Preliminary result)   Collection Time: 04/10/21  2:18 PM   Specimen: Liver; Abscess  Result Value Ref Range Status   Specimen Description   Final    LIVER CYSTS FLUID Performed at [facility] [address] [place], [place] [address]    Special Requests   Final    NONE Performed at [facility], [place] [address], [place], [place] [address]    Gram Stain   Final    FEW SQUAMOUS EPITHELIAL CELLS PRESENT FEW WBC SEEN NO ORGANISMS SEEN    Culture   Final    NO GROWTH 3 DAYS Performed at [facility], [address] [place], [place] [address]    Report Status PENDING  Incomplete          Radiology Studies: No results found.      Scheduled Meds:  fentaNYL       midazolam       enoxaparin (LOVENOX) injection  40 mg Subcutaneous Q24H   gabapentin  300 mg Oral BID   HYDROmorphone   Intravenous Q4H   lidocaine       Continuous Infusions:  lactated ringers with kcl 100 mL/hr at 04/13/21 1218   piperacillin-tazobactam (ZOSYN)  IV 3.375 g (04/13/21 0823)   promethazine (PHENERGAN) injection (IM or IVPB) 12.5 mg (04/12/21 1034)     LOS: 5 days        Hosie Poisson, MD [facility]   To contact the attending provider between 7A-7P or the covering provider during after hours 7P-7A, please log into the web site [email or web address] and access using universal  password for that web site. If you do not have the password, please call the hospital operator.  04/13/2021, 2:10 PM

## 2021-04-13 NOTE — Progress Notes (Signed)
Referring Physician(s): Akula,V  Supervising Physician: Michaelle Birks  Patient Status:  [facility] - In-pt  Chief Complaint: Right/left lateral abdominal/flank pain   Subjective: Pt continues to have some rt/left flank discomfort but sl improved from previous ,on diluadid pump; denies fever,HA, CP,dyspnea, cough, N/V or bleeding   Allergies: Imitrex [sumatriptan], Ibuprofen, Nitrofurantoin, and Oseltamivir  Medications: Prior to Admission medications   Medication Sig Start Date End Date Taking? Authorizing Provider  Biotin 10000 MCG TABS Take by mouth.   Yes [provider]  cetirizine (ZYRTEC) 10 MG tablet Take 10 mg by mouth daily as needed for allergies (alternates with claritin).   Yes [provider]  chlorthalidone (HYGROTON) 25 MG tablet TAKE 1 TABLET BY MOUTH  DAILY 03/27/20  Yes Hilts, Michael, MD  Cholecalciferol (VITAMIN D-3) 5000 units TABS Take by mouth.   Yes [provider]  Dapsone 5 % topical gel Apply 1 application topically 2 (two) times daily. 09/18/20  Yes Hilts, Legrand Como, MD  gabapentin (NEURONTIN) 300 MG capsule TAKE 1 CAPSULE BY MOUTH  TWICE DAILY 12/29/20  Yes Hilts, Michael, MD  JYNARQUE 90 & 30 MG TBPK  03/14/18  Yes [provider]  Multiple Vitamin (MULTIVITAMIN) tablet Take 1 tablet by mouth daily.   Yes [provider]  ondansetron (ZOFRAN ODT) 4 MG disintegrating tablet Take 1 tablet (4 mg total) by mouth every 8 (eight) hours as needed for nausea or vomiting. 04/05/21  Yes Rayna Sexton, PA-C  zolpidem (AMBIEN) 10 MG tablet TAKE 1/2 TO 1 TABLET BY  MOUTH AT BEDTIME AS NEEDED  FOR SLEEP 03/23/21  Yes Hilts, Michael, MD  tretinoin (RETIN-A) 0.025 % cream Apply topically at bedtime. 03/23/21   Hilts, Legrand Como, MD     Vital Signs: BP 123/63 (BP Location: Left Arm)   Pulse 86   Temp 98.2 F (36.8 C) (Oral)   Resp 18   Ht 5' (1.524 m)   Wt 118 lb (53.5 kg)   LMP 03/16/2021 Comment: period has lasted for 2 weeks   SpO2 93%   BMI 23.05 kg/m   Physical Exam awake/alert; chest- sl dim BS bases; heart- RRR; abd- soft,+BS, some mild palpable tenderness epigastric/RUQ/LUQ regions, mild distension; no LE edema  Imaging: ECHOCARDIOGRAM COMPLETE  Result Date: 04/11/2021    ECHOCARDIOGRAM REPORT   Patient Name:   Judy Manning Date of Exam: 04/11/2021 Medical Rec #:  [id number]        Height:       60.0 in Accession #:    [id number]       Weight:       118.0 lb Date of Birth:  53/03/1966        BSA:          1.492 m Patient Age:    55 years         BP:           123/67 mmHg Patient Gender: F                HR:           106 bpm. Exam Location:  Inpatient Procedure: 2D Echo, Cardiac Doppler and Color Doppler Indications:    Elevated Troponin  History:        Patient has no prior history of Echocardiogram examinations.                 Polycystic kidney disease, hypertension, chronic kidney disease.  Fevers fatigue and right chest/flank pain.  Sonographer:    Darlina Sicilian RDCS Referring Phys: Theola Sequin  Sonographer Comments: Augemntation Mammaplasty. IMPRESSIONS  1. Left ventricular ejection fraction, by estimation, is 60 to 65%. The left ventricle has normal function. The left ventricle has no regional wall motion abnormalities. Left ventricular diastolic parameters were normal.  2. Right ventricular systolic function is normal. The right ventricular size is normal.  3. The mitral valve is normal in structure. No evidence of mitral valve regurgitation. No evidence of mitral stenosis.  4. The aortic valve is tricuspid. Aortic valve regurgitation is not visualized. No aortic stenosis is present.  5. The inferior vena cava is normal in size with greater than 50% respiratory variability, suggesting right atrial pressure of 3 mmHg. FINDINGS  Left Ventricle: Left ventricular ejection fraction, by estimation, is 60 to 65%. The left ventricle has normal function. The left ventricle has no regional wall motion  abnormalities. The left ventricular internal cavity size was normal in size. There is  no left ventricular hypertrophy. Left ventricular diastolic parameters were normal. Right Ventricle: The right ventricular size is normal. Right ventricular systolic function is normal. Left Atrium: Left atrial size was normal in size. Right Atrium: Right atrial size was normal in size. Pericardium: Trivial pericardial effusion is present. Mitral Valve: The mitral valve is normal in structure. No evidence of mitral valve regurgitation. No evidence of mitral valve stenosis. Tricuspid Valve: The tricuspid valve is normal in structure. Tricuspid valve regurgitation is trivial. No evidence of tricuspid stenosis. Aortic Valve: The aortic valve is tricuspid. Aortic valve regurgitation is not visualized. No aortic stenosis is present. Pulmonic Valve: The pulmonic valve was normal in structure. Pulmonic valve regurgitation is not visualized. No evidence of pulmonic stenosis. Aorta: The aortic root is normal in size and structure. Venous: The inferior vena cava is normal in size with greater than 50% respiratory variability, suggesting right atrial pressure of 3 mmHg. IAS/Shunts: No atrial level shunt detected by color flow Doppler.  LEFT VENTRICLE PLAX 2D LVIDd:         3.80 cm  Diastology LVIDs:         2.60 cm  LV e' medial:    7.15 cm/s LV PW:         0.80 cm  LV E/e' medial:  6.9 LV IVS:        0.90 cm  LV e' lateral:   8.94 cm/s LVOT diam:     1.70 cm  LV E/e' lateral: 5.5 LV SV:         29 LV SV Index:   19 LVOT Area:     2.27 cm  RIGHT VENTRICLE RV S prime:     15.20 cm/s TAPSE (M-mode): 1.9 cm LEFT ATRIUM           Index LA diam:      3.00 cm 2.01 cm/m LA Vol (A4C): 25.6 ml 17.16 ml/m  AORTIC VALVE LVOT Vmax:   75.30 cm/s LVOT Vmean:  47.300 cm/s LVOT VTI:    0.126 m  AORTA Ao Root diam: 2.60 cm Ao Asc diam:  2.70 cm MITRAL VALVE MV Area (PHT): 4.42 cm    SHUNTS MV Decel Time: 172 msec    Systemic VTI:  0.13 m MV E velocity:  49.30 cm/s  Systemic Diam: 1.70 cm MV A velocity: 51.00 cm/s MV E/A ratio:  0.97 Kirk Ruths MD Electronically signed by Kirk Ruths MD Signature Date/Time: 04/11/2021/1:54:05 PM    Final  [place] FINE NEEDLE ASP 1ST LESION  Result Date: 04/10/2021 INDICATION: 55 year old female referred for aspiration of liver cyst EXAM: IMAGE GUIDED ASPIRATION RIGHT LIVER CYST MEDICATIONS: None ANESTHESIA/SEDATION: Fentanyl 100 mcg IV; Versed 2.0 mg IV Moderate Sedation Time:  16 minutes The patient was continuously monitored during the procedure by the interventional radiology nurse under my direct supervision. COMPLICATIONS: None PROCEDURE: Informed written consent was obtained from the patient after a thorough discussion of the procedural risks, benefits and alternatives. All questions were addressed. Maximal Sterile Barrier Technique was utilized including caps, mask, sterile gowns, sterile gloves, sterile drape, hand hygiene and skin antiseptic. A timeout was performed prior to the initiation of the procedure. Ultrasound survey of the right liver lobe performed with images stored and sent to PACs. The right lower thorax/right upper abdomen was prepped with chlorhexidine in a sterile fashion, and a sterile drape was applied covering the operative field. A sterile gown and sterile gloves were used for the procedure. Local anesthesia was provided with 1% Lidocaine. The patient was prepped and draped sterilely and the skin and subcutaneous tissues were generously infiltrated with 1% lidocaine. Small stab incision was made with 11 blade scalpel and gentle soft tissue spreading was performed with a hemostat. A 10 French drain was then advanced with ultrasound guidance via trocar technique into the targeted large cyst of the right hepatic dome. Drain was advanced into position and the needle was removed. We then aspirated approximally 100 cc of thin yellow fluid with minimal debris. Sample was sent for culture. After  aspiration of the fluid, the drain was removed. Final ultrasound image was performed. The patient tolerated the procedure well and remained hemodynamically stable throughout. No complications were encountered and no significant blood loss was encounter. IMPRESSION: Status post ultrasound-guided aspiration of right hepatic dome cyst. Signed, Dulcy Fanny. Dellia Nims, RPVI [facility] Electronically Signed   By: Corrie Mckusick D.O.   On: 04/10/2021 15:04    Labs:  CBC: Recent Labs    04/10/21 0925 04/11/21 0609 04/12/21 1032 04/13/21 0441  WBC 12.6* 8.8 10.1 7.2  HGB 9.5* 9.0* 9.3* 9.0*  HCT 28.7* 27.4* 28.2* 27.7*  PLT 415* 403* 488* 514*    COAGS: Recent Labs    04/09/21 0532 04/13/21 0441  INR 1.5* 1.1  APTT 31  --     BMP: Recent Labs    04/10/21 0925 04/11/21 0609 04/12/21 1032 04/13/21 0441  NA 136 134* 132* 138  K 3.8 3.7 4.1 4.4  CL 100 98 92* 98  CO2 '28 27 28 31  '$ GLUCOSE 173* 100* 98 87  BUN '11 9 7 '$ 5*  CALCIUM 8.1* 8.1* 8.1* 8.5*  CREATININE 1.26* 1.15* 1.08* 1.06*  GFRNONAA 51* 57* >60 >60    LIVER FUNCTION TESTS: Recent Labs    04/09/21 0532 04/10/21 0925 04/11/21 0609 04/12/21 1032  BILITOT 0.9 0.6 0.5 0.5  AST 44* 73* 52* 40  ALT 34 56* 49* 47*  ALKPHOS 92 98 80 94  PROT 6.2* 6.0* 5.6* 6.1*  ALBUMIN 2.2* 2.2* 2.0* 2.2*    Assessment and Plan: Pt with hx PCKD, HTN, migraines, multiple hepatic cysts, abd/flank pain, recent fevers, s/p right hepatic dome cyst aspiration on 9/10-cultures negative to date; afebrile, WBC normal, hemoglobin 9, platelets 514k, PT/INR normal, creatinine 1.06; patient scheduled for image guided aspiration of largest right renal cyst today.  Case has been reviewed by Dr. Maryelizabeth Kaufmann. Details/risks of procedure, including but not limited to, internal bleeding,  infection, injury to adjacent structures discussed with patient with her understanding and  consent.   Electronically Signed: D. Rowe Robert, PA-C 04/13/2021, 10:35 AM   I spent a total of 20 minutes at the the patient's bedside AND on the patient's hospital floor or unit, greater than 50% of which was counseling/coordinating care for image guided aspiration of renal cyst    Patient ID: Judy Manning, female   DOB: 03-02-1966, 55 y.o.   MRN: [id number]

## 2021-04-13 NOTE — Procedures (Signed)
Vascular and Interventional Radiology Procedure Note  Patient: Judy Manning DOB: 06-27-1966 Medical Record Number: [id number] Note Date/Time: 04/13/21 2:16 PM   Performing Physician: Michaelle Birks, MD Assistant(s): None  Diagnosis: ADPKD  Procedure: ASPIRATION OF DOMINANT RIGHT RENAL CYST  Anesthesia: Local Anesthetic Complications: None Estimated Blood Loss:  0 mL Specimens: Sent for None  Findings:  Successful Ultrasound-guided aspiration of dominant R renal cyst, yielding 120 mL of serous fluid.   See detailed procedure note with images in PACS. The patient tolerated the procedure well without incident or complication and was returned to Floor Bed in stable condition.    Michaelle Birks, MD [facility]   Pager. [place]

## 2021-04-13 NOTE — Progress Notes (Signed)
[facility] Progress Note  Judy Manning 55 y.o. Sep 26, 1965  CC:  Polycystic liver disease, large cysts with internal debris   Subjective: Patient states her pain is overall well controlled through medication. She had multiple diarrheal bowel movements yesterday. Still unable to sit up due to "room spinning" when she does so. Has some nausea, no vomiting. Endorses some abdominal discomfort throughout abdomen, but worse in RUQ. Denies melena/hematochezia.   ROS : Review of Systems  Cardiovascular:  Negative for chest pain and palpitations.  Gastrointestinal:  Positive for abdominal pain (RUQ), diarrhea and nausea. Negative for blood in stool, constipation, heartburn, melena and vomiting.     Objective: Vital signs in last 24 hours: Vitals:   04/13/21 0434 04/13/21 0500  BP: 123/63   Pulse: 86   Resp: 18 19  Temp: 98.2 F (36.8 C)   SpO2: 95% 97%    Physical Exam:  General:  Alert, cooperative, no distress  Head:  Normocephalic, without obvious abnormality, atraumatic  Eyes:  Anicteric sclera, EOM's intact  Lungs:   Clear to auscultation bilaterally, respirations unlabored  Heart:  Regular rate and rhythm, S1, S2 normal  Abdomen:   Abdominal distention with high pitched bowel sounds in upper abdomen, decreased sounds in left lower quadrant. Tenderness to RUQ on palpation. Improvement in tenderness to lower quadrants. no masses,     Lab Results: Recent Labs    04/10/21 1510 04/11/21 0609 04/12/21 1032  NA  --  134* 132*  K  --  3.7 4.1  CL  --  98 92*  CO2  --  27 28  GLUCOSE  --  100* 98  BUN  --  9 7  CREATININE  --  1.15* 1.08*  CALCIUM  --  8.1* 8.1*  MG 1.5*  --  1.6*   Recent Labs    04/11/21 0609 04/12/21 1032  AST 52* 40  ALT 49* 47*  ALKPHOS 80 94  BILITOT 0.5 0.5  PROT 5.6* 6.1*  ALBUMIN 2.0* 2.2*   Recent Labs    04/10/21 0925 04/11/21 0609 04/12/21 1032 04/13/21 0441  WBC 12.6*   < > 10.1 7.2  NEUTROABS 11.2*  --  8.7*  --    HGB 9.5*   < > 9.3* 9.0*  HCT 28.7*   < > 28.2* 27.7*  MCV 82.2   < > 82.7 82.7  PLT 415*   < > 488* 514*   < > = values in this interval not displayed.   Recent Labs    04/13/21 0441  LABPROT 14.6  INR 1.1      Assessment Polycystic liver disease, large cysts with internal debris - Status post right hepatic dome cyst drainage by IR with aspiration of 100 cc of thin yellow fluid performed 04/10/21 -LFTs mildly elevated, AST 47 - downtrending, Tbili WNL, lipase normal on admit   On Dilaudid PCA pump for pain control along with gabapentin 300 mg twice a day and tramadol 100 mg every 6 hours as needed for moderate pain   On IV Zosyn, empiric treatment for possible infected cyst Improvement in WBC, now normal at 7.2. mild anemia hemoglobin 9 with elevated platelet of 514    Polycystic kidney disease, creatinine 1.06 and GFR >60, improvement from yesterday  Constipation   Plan:  Miralax 17 grams BID  Currently NPO for IR procedure today. After procedure, can advance diet as tolerated.  Continue pain management, eventually patient will need early and aggressive ambulation to prevent other complications.  IR aspiration of renal vs hepatic cyst today. Due to size of lesion, surgery thought not to be beneficial to do fenestration of cysts.  As an outpatient, once pain is adequately controlled, patient will need to be referred to outpatient tertiary transplant center for evaluation for liver and kidney transplant.  [facility] will follow   Garnette Scheuermann PA-C 04/13/2021, 8:13 AM  Contact #  [phone]

## 2021-04-13 NOTE — Evaluation (Signed)
Physical Therapy Evaluation Patient Details Name: Judy Manning MRN: [id number] DOB: March 03, 1966 Today's Date: 04/13/2021  History of Present Illness  55 year old lady with prior history of polycystic kidney disease, hypertension, migraine headaches presents with right flank pain right-sided abdominal pain started about 2 weeks ago associated with fevers and some nausea and vomiting. CT-Polycystic liver disease, large cysts with internal debris.Status post right hepatic dome cyst drainage by IR performed 04/10/21.  S/P ASPIRATION OF DOMINANT RIGHT RENAL CYST 04/13/21  Clinical Impression  The patient is eager to mobilize, reports having attempted to sit up on bed edge yesterday with much dizziness. Worked on bed/chair position to   slowly acclimate upright/dependency. Patient reports having the feature to sit upright and legs down was very relieving to her pain.  Patient remained in semi bed/chair position at end of session. BP supine 131/75, bed chair position 70*-109/90, after 10 minutes BP 120/68. Patient reports intermittent dizziness, especially turning head. Patient on  morphine PCA.  Patient should progress to  ambulation , return home.  Pt admitted with above diagnosis.  Pt currently with functional limitations due to the deficits listed below (see PT Problem List). Pt will benefit from skilled PT to increase their independence and safety with mobility to allow discharge to the venue listed below.        Recommendations for follow up therapy are one component of a multi-disciplinary discharge planning process, led by the attending physician.  Recommendations may be updated based on patient status, additional functional criteria and insurance authorization.  Follow Up Recommendations Home health PT    Equipment Recommendations   (tba)    Recommendations for Other Services       Precautions / Restrictions Precautions Precaution Comments: hypostensive, monitor orthostatics       Mobility  Bed Mobility Overal bed mobility: Needs Assistance             General bed mobility comments: bed slowly placed in chair position to gradually aclimate dependent posture. Patient tolerated 70* of HOB and legs dependnet , remained in position at end of session as patient felt more comfortabl;e. Mild lightheaded, ? due to PCA morphine contributing. Patient did not  want to try to trun  to sitting bedside as she has a procedure later today.    Transfers                 General transfer comment: TBA- sat on bed edge yesterday and had significant dizziness.  Ambulation/Gait                Stairs            Wheelchair Mobility    Modified Rankin (Stroke Patients Only)       Balance                                             Pertinent Vitals/Pain Pain Assessment: 0-10 Pain Score: 7  Pain Location: back right side Pain Descriptors / Indicators: Discomfort Pain Intervention(s): Limited activity within patient's tolerance;Monitored during session;PCA encouraged    Home Living Family/patient expects to be discharged to:: Private residence Living Arrangements: Spouse/significant other;Parent Available Help at Discharge: Family;Available 24 hours/day Type of Home: House Home Access: Stairs to enter   [employer] of Steps: 2 Home Layout: Two level;Able to live on main level with bedroom/bathroom Home Equipment: None  Prior Function Level of Independence: Independent               Hand Dominance        Extremity/Trunk Assessment   Upper Extremity Assessment Upper Extremity Assessment: Overall WFL for tasks assessed    Lower Extremity Assessment Lower Extremity Assessment: Generalized weakness    Cervical / Trunk Assessment Cervical / Trunk Assessment: Normal  Communication   Communication: No difficulties  Cognition Arousal/Alertness: Awake/alert Behavior During Therapy: WFL for tasks  assessed/performed Overall Cognitive Status: Within Functional Limits for tasks assessed                                        General Comments General comments (skin integrity, edema, etc.): able to to pull forward using bilateral rails  while sitting upright in bed multiple times    Exercises General Exercises - Lower Extremity Ankle Circles/Pumps: AROM;Both;10 reps Short Arc Quad: AROM;Both;20 reps Heel Slides: AROM;20 reps;Both;Supine   Assessment/Plan    PT Assessment Patient needs continued PT services  PT Problem List Decreased strength;Decreased mobility;Decreased safety awareness;Cardiopulmonary status limiting activity;Decreased activity tolerance;Pain       PT Treatment Interventions DME instruction;Therapeutic activities;Gait training;Therapeutic exercise;Patient/family education;Functional mobility training;Stair training    PT Goals (Current goals can be found in the Care Plan section)  Acute Rehab PT Goals Patient Stated Goal: to be able to walk, no pain PT Goal Formulation: With patient/family Time For Goal Achievement: 04/27/21 Potential to Achieve Goals: Good    Frequency Min 3X/week   Barriers to discharge        Co-evaluation               AM-PAC PT "6 Clicks" Mobility  Outcome Measure Help needed turning from your back to your side while in a flat bed without using bedrails?: A Little Help needed moving from lying on your back to sitting on the side of a flat bed without using bedrails?: A Little Help needed moving to and from a bed to a chair (including a wheelchair)?: A Lot Help needed standing up from a chair using your arms (e.g., wheelchair or bedside chair)?: A Lot Help needed to walk in hospital room?: Total Help needed climbing 3-5 steps with a railing? : Total 6 Click Score: 12    End of Session   Activity Tolerance: Patient tolerated treatment well Patient left: in bed;with call bell/phone within reach;with  family/visitor present Nurse Communication: Mobility status PT Visit Diagnosis: Unsteadiness on feet (R26.81);Difficulty in walking, not elsewhere classified (R26.2)    Time: 1120-1200 PT Time Calculation (min) (ACUTE ONLY): 40 min   Charges:   PT Evaluation $PT Eval Low Complexity: 1 Low PT Treatments $Therapeutic Exercise: 8-22 mins $Therapeutic Activity: 8-22 mins        Tresa Endo PT Acute Rehabilitation Services Pager [phone] Office [phone]   Claretha Cooper 04/13/2021, 3:39 PM

## 2021-04-13 NOTE — Progress Notes (Addendum)
Subjective: No new complaints since yesterday.  Pain is much better with pain meds she has now.  Trying to drink some liquids.  Other foods, more solid ones, tend to cause some nausea.  ROS: See above, otherwise other systems negative  Objective: Vital signs in last 24 hours: Temp:  [98 F (36.7 C)-98.4 F (36.9 C)] 98.2 F (36.8 C) (09/13 0434) Pulse Rate:  [86-100] 86 (09/13 0434) Resp:  [16-19] 18 (09/13 0814) BP: (123-131)/(63-86) 123/63 (09/13 0434) SpO2:  [93 %-100 %] 93 % (09/13 0814) Last BM Date: 04/12/21  Intake/Output from previous day: 09/12 0701 - 09/13 0700 In: 1329 [P.O.:220; I.V.:758.1; IV Piggyback:350.9] Out: 600 [Urine:600] Intake/Output this shift: No intake/output data recorded.  PE: Heart: regular Lungs: CTAB Abd: soft, minimally tender currently, +BS, mild distention  Lab Results:  Recent Labs    04/12/21 1032 04/13/21 0441  WBC 10.1 7.2  HGB 9.3* 9.0*  HCT 28.2* 27.7*  PLT 488* 514*   BMET Recent Labs    04/11/21 0609 04/12/21 1032  NA 134* 132*  K 3.7 4.1  CL 98 92*  CO2 27 28  GLUCOSE 100* 98  BUN 9 7  CREATININE 1.15* 1.08*  CALCIUM 8.1* 8.1*   PT/INR Recent Labs    04/13/21 0441  LABPROT 14.6  INR 1.1   CMP     Component Value Date/Time   NA 132 (L) 04/12/2021 1032   K 4.1 04/12/2021 1032   CL 92 (L) 04/12/2021 1032   CO2 28 04/12/2021 1032   GLUCOSE 98 04/12/2021 1032   BUN 7 04/12/2021 1032   CREATININE 1.08 (H) 04/12/2021 1032   CREATININE 1.51 (H) 05/06/2019 0859   CALCIUM 8.1 (L) 04/12/2021 1032   PROT 6.1 (L) 04/12/2021 1032   ALBUMIN 2.2 (L) 04/12/2021 1032   AST 40 04/12/2021 1032   ALT 47 (H) 04/12/2021 1032   ALKPHOS 94 04/12/2021 1032   BILITOT 0.5 04/12/2021 1032   GFRNONAA >60 04/12/2021 1032   GFRAA  08/31/2009 1605    >60        The eGFR has been calculated using the MDRD equation. This calculation has not been validated in all clinical situations. eGFR's persistently <60 mL/min  signify possible Chronic Kidney Disease.   Lipase     Component Value Date/Time   LIPASE 15 04/08/2021 1211       Studies/Results: ECHOCARDIOGRAM COMPLETE  Result Date: 04/11/2021    ECHOCARDIOGRAM REPORT   Patient Name:   CLAIRA JETER Date of Exam: 04/11/2021 Medical Rec #:  [id number]        Height:       60.0 in Accession #:    [id number]       Weight:       118.0 lb Date of Birth:  1966/03/16        BSA:          1.492 m Patient Age:    80 years         BP:           123/67 mmHg Patient Gender: F                HR:           106 bpm. Exam Location:  Inpatient Procedure: 2D Echo, Cardiac Doppler and Color Doppler Indications:    Elevated Troponin  History:        Patient has no prior history of Echocardiogram examinations.  Polycystic kidney disease, hypertension, chronic kidney disease.                 Fevers fatigue and right chest/flank pain.  Sonographer:    Darlina Sicilian RDCS Referring Phys: Theola Sequin  Sonographer Comments: Augemntation Mammaplasty. IMPRESSIONS  1. Left ventricular ejection fraction, by estimation, is 60 to 65%. The left ventricle has normal function. The left ventricle has no regional wall motion abnormalities. Left ventricular diastolic parameters were normal.  2. Right ventricular systolic function is normal. The right ventricular size is normal.  3. The mitral valve is normal in structure. No evidence of mitral valve regurgitation. No evidence of mitral stenosis.  4. The aortic valve is tricuspid. Aortic valve regurgitation is not visualized. No aortic stenosis is present.  5. The inferior vena cava is normal in size with greater than 50% respiratory variability, suggesting right atrial pressure of 3 mmHg. FINDINGS  Left Ventricle: Left ventricular ejection fraction, by estimation, is 60 to 65%. The left ventricle has normal function. The left ventricle has no regional wall motion abnormalities. The left ventricular internal cavity size was normal  in size. There is  no left ventricular hypertrophy. Left ventricular diastolic parameters were normal. Right Ventricle: The right ventricular size is normal. Right ventricular systolic function is normal. Left Atrium: Left atrial size was normal in size. Right Atrium: Right atrial size was normal in size. Pericardium: Trivial pericardial effusion is present. Mitral Valve: The mitral valve is normal in structure. No evidence of mitral valve regurgitation. No evidence of mitral valve stenosis. Tricuspid Valve: The tricuspid valve is normal in structure. Tricuspid valve regurgitation is trivial. No evidence of tricuspid stenosis. Aortic Valve: The aortic valve is tricuspid. Aortic valve regurgitation is not visualized. No aortic stenosis is present. Pulmonic Valve: The pulmonic valve was normal in structure. Pulmonic valve regurgitation is not visualized. No evidence of pulmonic stenosis. Aorta: The aortic root is normal in size and structure. Venous: The inferior vena cava is normal in size with greater than 50% respiratory variability, suggesting right atrial pressure of 3 mmHg. IAS/Shunts: No atrial level shunt detected by color flow Doppler.  LEFT VENTRICLE PLAX 2D LVIDd:         3.80 cm  Diastology LVIDs:         2.60 cm  LV e' medial:    7.15 cm/s LV PW:         0.80 cm  LV E/e' medial:  6.9 LV IVS:        0.90 cm  LV e' lateral:   8.94 cm/s LVOT diam:     1.70 cm  LV E/e' lateral: 5.5 LV SV:         29 LV SV Index:   19 LVOT Area:     2.27 cm  RIGHT VENTRICLE RV S prime:     15.20 cm/s TAPSE (M-mode): 1.9 cm LEFT ATRIUM           Index LA diam:      3.00 cm 2.01 cm/m LA Vol (A4C): 25.6 ml 17.16 ml/m  AORTIC VALVE LVOT Vmax:   75.30 cm/s LVOT Vmean:  47.300 cm/s LVOT VTI:    0.126 m  AORTA Ao Root diam: 2.60 cm Ao Asc diam:  2.70 cm MITRAL VALVE MV Area (PHT): 4.42 cm    SHUNTS MV Decel Time: 172 msec    Systemic VTI:  0.13 m MV E velocity: 49.30 cm/s  Systemic Diam: 1.70 cm MV A velocity: 51.00 cm/s  MV E/A  ratio:  0.97 Kirk Ruths MD Electronically signed by Kirk Ruths MD Signature Date/Time: 04/11/2021/1:54:05 PM    Final     Anti-infectives: Anti-infectives (From admission, onward)    Start     Dose/Rate Route Frequency Ordered Stop   04/09/21 0000  piperacillin-tazobactam (ZOSYN) IVPB 3.375 g        3.375 g 12.5 mL/hr over 240 Minutes Intravenous Every 8 hours 04/08/21 2307     04/08/21 1745  cefTRIAXone (ROCEPHIN) 1 g in sodium chloride 0.9 % 100 mL IVPB        1 g 200 mL/hr over 30 Minutes Intravenous  Once 04/08/21 1743 04/08/21 1830        Assessment/Plan Numerous Hepatic cysts -no further fevers in over 24 hrs -WBC normal, on zosyn -for IR aspiration of renal vs hepatic cyst today -review of her imaging with our [facility] specialist was done.  It was felt that based on the size of these lesion, being small, that fenestration would likely not be beneficial vs if she had several large cysts that could be addressed, that may make a bigger difference in pain vs multiple smaller lesions.  These are taking up a significant portion of her liver parenchyma which makes this a bit more complex.  If she were to continue with pain and issues despite aspiration, etc, the recommendation for transfer to a tertiary care facility for their assessment and evaluation would be recommended. -there are no plans for any acute surgical intervention here. -defer further care at this point to GI and medical service.  FEN - NPO for IR aspiration VTE - lovenox ID - zosyn  PCKD   LOS: 5 days    Henreitta Cea , [facility] 04/13/2021, 8:27 AM Please see Amion for pager number during day hours 7:00am-4:30pm or 7:00am -11:30am on weekends

## 2021-04-14 DIAGNOSIS — Z8669 Personal history of other diseases of the nervous system and sense organs: Secondary | ICD-10-CM

## 2021-04-14 DIAGNOSIS — K750 Abscess of liver: Secondary | ICD-10-CM

## 2021-04-14 LAB — C DIFFICILE (CDIFF) QUICK SCRN (NO PCR REFLEX)
C Diff antigen: NEGATIVE
C Diff interpretation: NOT DETECTED
C Diff toxin: NEGATIVE

## 2021-04-14 MED ORDER — METRONIDAZOLE 500 MG PO TABS
500.0000 mg | ORAL_TABLET | Freq: Two times a day (BID) | ORAL | Status: DC
  Administered 2021-04-14 – 2021-04-17 (×7): 500 mg via ORAL
  Filled 2021-04-14 (×7): qty 1

## 2021-04-14 MED ORDER — AMOXICILLIN-POT CLAVULANATE 875-125 MG PO TABS
1.0000 | ORAL_TABLET | Freq: Two times a day (BID) | ORAL | Status: DC
  Filled 2021-04-14: qty 1

## 2021-04-14 MED ORDER — ENSURE ENLIVE PO LIQD
237.0000 mL | Freq: Two times a day (BID) | ORAL | Status: DC
  Administered 2021-04-14 – 2021-04-16 (×2): 237 mL via ORAL

## 2021-04-14 MED ORDER — CEFDINIR 300 MG PO CAPS
300.0000 mg | ORAL_CAPSULE | Freq: Two times a day (BID) | ORAL | Status: DC
  Administered 2021-04-14 – 2021-04-17 (×7): 300 mg via ORAL
  Filled 2021-04-14 (×7): qty 1

## 2021-04-14 MED ORDER — OXYCODONE HCL 5 MG PO TABS: 10.0000 mg | ORAL_TABLET | ORAL | Status: DC | PRN

## 2021-04-14 MED ORDER — HYDROMORPHONE HCL 1 MG/ML IJ SOLN: 1.0000 mg | INTRAMUSCULAR | Status: DC | PRN

## 2021-04-14 NOTE — Progress Notes (Signed)
Initial Nutrition Assessment  INTERVENTION:   -Ensure Plus PO BID, each provides 350 kcals and 13g protein  NUTRITION DIAGNOSIS:   Increased nutrient needs related to chronic illness as evidenced by estimated needs.  GOAL:   Patient will meet greater than or equal to 90% of their needs  MONITOR:   PO intake, Supplement acceptance, Labs, Weight trends, I & O's  REASON FOR ASSESSMENT:   Consult Assessment of nutrition requirement/status  ASSESSMENT:   55 year old female with history of polycystic kidney disease, hypertension, migraine headaches comes in with a little sudden right flank and right-sided abdominal pain that started about couple of weeks ago and is progressively gotten worse.  She underwent an MRI of the abdomen which showed mildly dilated CBD of 8 mm.  Due to severity of the pain she was placed on Dilaudid PCA.  General surgery as well as IR consulted.  She underwent an aspiration of one of the liver cysts on 9/10 as well as aspiration of the renal cyst on 9/13.  Patient reporting N/V for 2 weeks PTA. Pt was admitted on 9/8. Continues to have some nausea with solid food intake and having loose stools.  Only ordering soups for meals at this time. Will order Ensure supplements for additional kcals and protein.   Per weight records, pt has had no weight changes recently. Report some weight gain d/t fluid.  Medications: Lactated ringers  Labs reviewed.  NUTRITION - FOCUSED PHYSICAL EXAM:  Deferred.  Diet Order:   Diet Order             Diet regular Room service appropriate? Yes; Fluid consistency: Thin  Diet effective now                   EDUCATION NEEDS:   No education needs have been identified at this time  Skin:  Skin Assessment: Reviewed RN Assessment  Last BM:  9/13 -type 7  Height:   Ht Readings from Last 1 Encounters:  04/08/21 5' (1.524 m)    Weight:   Wt Readings from Last 1 Encounters:  04/08/21 53.5 kg    BMI:  Body mass  index is 23.05 kg/m.  Estimated Nutritional Needs:   Kcal:  1500-1700  Protein:  70-85g  Fluid:  1.7L/day   Clayton Bibles, MS, RD, LDN Inpatient Clinical Dietitian Contact information available via Amion

## 2021-04-14 NOTE — Progress Notes (Addendum)
[facility] Progress Note   Danielle Gambino 55 y.o. February 12, 1966  CC:  Polycystic liver disease, large cysts with internal debris  Subjective:  Status post IR right kidney cyst drainage 120 mL.  Patient is advancing diet slowly able to have some solids half a sandwich last night half a bagel this morning without nausea and vomiting. Patient has continued pain currently more pinpoint right thoracic Paraspinous, worse with movement.  Right upper quadrant pain has resolved.  Patient continually on PCA pump with goal to transition to oral pain medications. Patient currently now working with physical therapy as well, had first session yesterday.  [place] continues to endorse vertigo with sitting. She also has nonproductive cough x3 days, no worsening shortness of breath, worse with movement.  No leg swelling or erythema.  Patient is on outpatient claritin has not had this during this hospital stay, not doing incentive spirometer.. Patient's had 3 diarrhea episodes yesterday and 2 today, no abnormal spell, no abdominal discomfort, but will endorse abdominal gurgling with urgency denies melena or hematochezia.  Started after 1 Dulcolax. Continue abdominal distention, has improved some.  Review of Systems  Constitutional:  Positive for malaise/fatigue. Negative for chills and fever.  Respiratory:  Positive for cough. Negative for hemoptysis, sputum production, shortness of breath and wheezing.   Cardiovascular:  Negative for chest pain, palpitations and leg swelling.  Gastrointestinal:  Positive for abdominal pain (RUQ), diarrhea and nausea. Negative for blood in stool, constipation, heartburn, melena and vomiting.  Musculoskeletal:  Negative for falls.  Neurological:  Positive for dizziness.    Objective: Vital signs in last 24 hours: Temp:  [98 F (36.7 C)-98.8 F (37.1 C)] 98.1 F (36.7 C) (09/14 0531) Pulse Rate:  [72-95] 90 (09/14 0531) Resp:  [14-22] 16 (09/14 0753) BP:  (117-140)/(64-77) 140/70 (09/14 0531) SpO2:  [95 %-100 %] 97 % (09/14 0753) Weight change:  Last BM Date: 04/13/21  PE: lying in bed, more comfortable than previously, [place] in place.  GENERAL: Anxious Respiratory: CTAB ABDOMEN: mild Distended high pitched bowel sounds, mild generalized tenderness, no rebound. EXTREMITIES: No deformity, no edema, no erythema Musculoskeletal: tender to palpation right Paraspinous   Lab Results: Results for orders placed or performed during the hospital encounter of 04/08/21 (from the past 48 hour(s))  Protime-INR     Status: None   Collection Time: 04/13/21  4:41 AM  Result Value Ref Range   Prothrombin Time 14.6 11.4 - 15.2 seconds   INR 1.1 0.8 - 1.2    Comment: (NOTE) INR goal varies based on device and disease states. Performed at [facility], [place] [address] [place], [place] [address]   CBC     Status: Abnormal   Collection Time: 04/13/21  4:41 AM  Result Value Ref Range   WBC 7.2 4.0 - 10.5 K/uL   RBC 3.35 (L) 3.87 - 5.11 MIL/uL   Hemoglobin 9.0 (L) 12.0 - 15.0 g/dL   HCT 27.7 (L) 36.0 - 46.0 %   MCV 82.7 80.0 - 100.0 fL   MCH 26.9 26.0 - 34.0 pg   MCHC 32.5 30.0 - 36.0 g/dL   RDW 13.2 11.5 - 15.5 %   Platelets 514 (H) 150 - 400 K/uL   nRBC 0.0 0.0 - 0.2 %    Comment: Performed at [facility], [place] [address], [place], [place] [id number]  Basic metabolic panel     Status: Abnormal   Collection Time: 04/13/21  4:41 AM  Result Value Ref Range  Sodium 138 135 - 145 mmol/L   Potassium 4.4 3.5 - 5.1 mmol/L   Chloride 98 98 - 111 mmol/L   CO2 31 22 - 32 mmol/L   Glucose, Bld 87 70 - 99 mg/dL    Comment: Glucose reference range applies only to samples taken after fasting for at least 8 hours.   BUN 5 (L) 6 - 20 mg/dL   Creatinine, Ser 1.06 (H) 0.44 - 1.00 mg/dL   Calcium 8.5 (L) 8.9 - 10.3 mg/dL   GFR, Estimated >60 >60 mL/min    Comment: (NOTE) Calculated using the CKD-EPI Creatinine Equation  (2021)    Anion gap 9 5 - 15    Comment: Performed at [facility], [place] [address], [place], [place] [address]  Magnesium     Status: None   Collection Time: 04/13/21  4:41 AM  Result Value Ref Range   Magnesium 2.2 1.7 - 2.4 mg/dL    Comment: Performed at [facility], [place] [address], [place], [place] [address]    Studies/Results: [place] FINE NEEDLE ASP 1ST LESION  Result Date: 04/13/2021 INDICATION: ADPKD, with symptomatic RIGHT renal cyst. EXAM: ULTRASOUND GUIDED RIGHT RENAL CYST ASPIRATION COMPARISON:  CT pelvis, 04/05/2021. MR abdomen, 04/09/2021. Renal ultrasound, 10/15/2020 MEDICATIONS: Versed 2 mg IV; Fentanyl 100 mcg IV ANESTHESIA/SEDATION: Total Moderate Sedation Time 13 minutes COMPLICATIONS: None immediate TECHNIQUE: Informed written consent was obtained from the patient after a discussion of the risks, benefits and alternatives to treatment. Questions regarding the procedure were encouraged and answered. A timeout was performed prior to the initiation of the procedure. The patient was positioned supine, with her RIGHT side wedged. The RIGHT flank was prepped and draped in the usual sterile fashion, and a sterile drape was applied covering the operative field. Maximum barrier sterile technique with sterile gowns and gloves were used for the procedure. A timeout was performed prior to the initiation of the procedure. Initial ultrasound scanning redemonstrated multi-cystic RIGHT kidney with dominant RIGHT inferior renal pole septated cyst. The dominant RIGHT inferior pole renal cyst was aspirated with a 5-French multi side-hole Yueh sheath needle after the overlying soft tissues was anesthetized with 1% lidocaine with epinephrine. A total of approximately 120 ml of serous fluid was aspirated. Postprocedural ultrasound imaging demonstrated decompression of the dominant inferior pole cyst with innumerable renal cysts remaining bilaterally. The patient tolerated  the procedure well without immediate postprocedural complication. 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 Michaelle Birks, MD [facility] Electronically Signed   By: Michaelle Birks M.D.   On: 04/13/2021 18:08    Medications: I have reviewed the patient's current medications.  Assessment: Polycystic liver disease, large cysts with internal debris Status post right hepatic dome cyst drainage by IR with aspiration of 100 cc of thin yellow fluid performed 04/10/21  Polycystic kidney disease, creatinine 1.15 and GFR 57 yesterday Patient continues to be in pain, still on PCA pump for pain control, will to transition to p.o. pain medications for control Patient currently working with physical therapy for mobility Patient is advancing diet with solids, has not had any nausea or vomiting.  Diarrhea and AB distended Has had several BM's, AB still with distention  Non productive cough Oxygen stable, CTA negative 04/09/2021, on lovenox  Plan: Continue advance diet Zofran as needed for nausea With antibiotic use get Cdiff stool  to rule out prior to further ABX Recommend continued pain management for better pain control- consider pain management referral Cough continue incentive spirometer, discuss with primary team.  Consider lidocaine patch on right thoracic back for possible muscular component Continue physical therapy with goal of ambulation Pain has improved some status postdrainage of right kidney cyst As an outpatient,once pain is adequately controlled, patient will need to be referred to tertiary transplant center for evaluation for liver and kidney transplant.   Vladimir Crofts, PA-C  04/14/2021, 11:12 AM

## 2021-04-14 NOTE — Progress Notes (Signed)
Chaplain met patient's mother in the elevator and saw that she was distressed about her daughter.  Chaplain met with Judy Manning and her mother together and provided listening presence as they shared about Judy Manning's sudden health changes and the difficulty of not knowing exactly what will happen next.  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  Her husband is also awaiting some test results of his own that could be related to a cancer diagnosis.  Judy Manning is also concerned about how much to share with her daughter who struggles with anxiety and who is newly independent. Chaplain asked questions to facilitate sharing and provided ministry of presence.  [place], Bcc Pager, [phone] 11:14 PM

## 2021-04-14 NOTE — Evaluation (Signed)
Occupational Therapy Evaluation Patient Details Name: Judy Manning MRN: [id number] DOB: 12/02/1965 Today's Date: 04/14/2021   History of Present Illness 55 year old lady with prior history of polycystic kidney disease, hypertension, migraine headaches presents with right flank pain right-sided abdominal pain started about 2 weeks ago associated with fevers and some nausea and vomiting. CT-Polycystic liver disease, large cysts with internal debris.Status post right hepatic dome cyst drainage by IR performed 04/10/21.  S/P ASPIRATION OF DOMINANT RIGHT RENAL CYST 04/13/21   Clinical Impression   Patient is a 55 year old female who was admitted for above. Patient was previously independent at home with husband. Currently, patient is min A for bed mobility and transfers with mod A for LB dressing tasks. Patients HR was noted to increase with movement from 89 bpm at rest with increasing to 100-150 bpm max with activity. Nursing aware. Patient is noted to have  decreased activity tolerance, decreased cardiopulmomary tolerance, and decreased standing balance impacting patients participation in ADLs. Patient would continue to benefit from skilled OT services at this time while admitted to address noted deficits in order to improve overall safety and independence in ADLs.     Patients blood pressure Supine: 157/74 mmhg Sitting 134/77 mmhg Standing 133/87 mmhg     Recommendations for follow up therapy are one component of a multi-disciplinary discharge planning process, led by the attending physician.  Recommendations may be updated based on patient status, additional functional criteria and insurance authorization.   Follow Up Recommendations  No OT follow up    Equipment Recommendations       Recommendations for Other Services       Precautions / Restrictions Precautions Precautions: Fall Precaution Comments: hypostensive, monitor orthostatics Restrictions Weight Bearing Restrictions: No       Mobility Bed Mobility Overal bed mobility: Needs Assistance Bed Mobility: Supine to Sit     Supine to sit: Min guard;HOB elevated          Transfers Overall transfer level: Needs assistance   Transfers: Sit to/from Stand;Stand Pivot Transfers Sit to Stand: Min guard Stand pivot transfers: Min assist            Balance Overall balance assessment: Mild deficits observed, not formally tested                                         ADL either performed or assessed with clinical judgement   ADL Overall ADL's : Needs assistance/impaired Eating/Feeding: Set up;Sitting   Grooming: Wash/dry face;Oral care;Sitting;Set up   Upper Body Bathing: Min guard;Bed level   Lower Body Bathing: Bed level;Minimal assistance   Upper Body Dressing : Minimal assistance;Bed level   Lower Body Dressing: Bed level;Minimal assistance   Toilet Transfer: BSC;Stand-pivot;Minimal assistance Toilet Transfer Details (indicate cue type and reason): with increased time. patient noted to have increased HR with attempting to use bathroom. nursing aware. Toileting- Clothing Manipulation and Hygiene: Minimal assistance;Sit to/from stand               Vision Baseline Vision/History: 0 No visual deficits       Perception     Praxis      Pertinent Vitals/Pain Pain Assessment: Faces Faces Pain Scale: Hurts little more Pain Location: back right side Pain Descriptors / Indicators: Discomfort Pain Intervention(s): Limited activity within patient's tolerance;Monitored during session;Repositioned     Hand Dominance Right   Extremity/Trunk Assessment Upper Extremity Assessment  Upper Extremity Assessment: Overall WFL for tasks assessed   Lower Extremity Assessment Lower Extremity Assessment: Defer to PT evaluation   Cervical / Trunk Assessment Cervical / Trunk Assessment: Normal   Communication Communication Communication: No difficulties   Cognition  Arousal/Alertness: Awake/alert Behavior During Therapy: WFL for tasks assessed/performed Overall Cognitive Status: Within Functional Limits for tasks assessed                                     General Comments       Exercises     Shoulder Instructions      Home Living Family/patient expects to be discharged to:: Private residence Living Arrangements: Spouse/significant other;Parent Available Help at Discharge: Family;Available 24 hours/day Type of Home: House Home Access: Stairs to enter [employer] of Steps: 2   Home Layout: Two level;Able to live on main level with bedroom/bathroom     Bathroom Shower/Tub: Walk-in shower         Home Equipment: None          Prior Functioning/Environment Level of Independence: Independent                 OT Problem List: Cardiopulmonary status limiting activity;Decreased activity tolerance;Impaired balance (sitting and/or standing);Decreased safety awareness;Decreased strength      OT Treatment/Interventions: Self-care/ADL training;Therapeutic exercise;Energy conservation;DME and/or AE instruction;Therapeutic activities;Balance training;Patient/family education    OT Goals(Current goals can be found in the care plan section) Acute Rehab OT Goals Patient Stated Goal: to be able to walk, no pain OT Goal Formulation: With patient Time For Goal Achievement: 04/28/21 Potential to Achieve Goals: Good  OT Frequency: Min 2X/week   Barriers to D/C:    patient was independent prior level       Co-evaluation              AM-PAC OT "6 Clicks" Daily Activity     Outcome Measure Help from another person eating meals?: None Help from another person taking care of personal grooming?: None Help from another person toileting, which includes using toliet, bedpan, or urinal?: A Little Help from another person bathing (including washing, rinsing, drying)?: A Lot Help from another person to put on and  taking off regular upper body clothing?: A Little Help from another person to put on and taking off regular lower body clothing?: A Lot 6 Click Score: 18   End of Session Equipment Utilized During Treatment: Gait belt Nurse Communication: Mobility status;Other (comment) (HR levels during session)  Activity Tolerance: Patient tolerated treatment well Patient left: in bed;with call bell/phone within reach;with family/visitor present  OT Visit Diagnosis: Unsteadiness on feet (R26.81);Muscle weakness (generalized) (M62.81)                Time: [id number] OT Time Calculation (min): 53 min Charges:  OT General Charges $OT Visit: 1 Visit OT Evaluation $OT Eval Moderate Complexity: 1 Mod OT Treatments $Self Care/Home Management : 38-52 mins  Jackelyn Poling OTR/L, MS Acute Rehabilitation Department Office# [phone] Pager# [phone]   [place] 04/14/2021, 4:27 PM

## 2021-04-14 NOTE — Progress Notes (Signed)
PROGRESS NOTE  Judy Manning [id number] DOB: November 11, 1965 DOA: 04/08/2021 PCP: Eunice Blase, MD   LOS: 6 days   Brief Narrative / Interim history: 55 year old female with history of polycystic kidney disease, hypertension, migraine headaches comes in with a little sudden right flank and right-sided abdominal pain that started about couple of weeks ago and is progressively gotten worse.  She underwent an MRI of the abdomen which showed mildly dilated CBD of 8 mm.  Due to severity of the pain she was placed on Dilaudid PCA.  General surgery as well as IR consulted.  She underwent an aspiration of one of the liver cysts on 9/10 as well as aspiration of the renal cyst on 9/13.  Subjective / 24h Interval events: Continues to have significant pain mainly in the right flank.  Does not appreciate much improvement after renal cyst aspiration yesterday  Assessment & Plan: Principal Problem Right upper quadrant pain, right flank pain / SIRS on admission-MRI of the abdomen showed innumerable hepatic lesions, combination of simple cysts and biliary hamartomas, with some of the larger cyst demonstrating varying degrees of complexity.  One of the loculations of the cyst also contain some findings concerning for proteinaceous/hemorrhagic debris.  CBD was dilated to 11 mm at the porta hepatis -IR consulted, underwent aspiration of 1 liver cyst as well as renal cyst, no clear-cut evidence of infected material -She was febrile to 101.6 on admission, had an elevated WBC raising concern whether some of the small cysts can actually be infected.  She was placed on Zosyn with improvement in her WBC as well as fever curve.  Case was discussed with ID, Dr. Drucilla Schmidt, difficult to fully rule out infection, he recommends Augmentin for a month then repeat CT scan as an outpatient. -Attempt to wean off Dilaudid PCA pump today, alternate oxycodone with IV Dilaudid and hopefully will get treated IV pain medications in the next  24 hours  Active Problems Polycystic kidney disease-she has been off Jynarque/ Tolvapton for couple of weeks since the onset of fevers, follows with Dr. Justin Mend as an outpatient  Hypomagnesemia and hypokalemia -continue to monitor and replace   Elevated troponin  -Suspect from demand ischemia from SIRS. Repeat EKG shows sinus tachycardia without any ischemic changes. She denies any chest pain. Echocardiogram unremarkable.    Stage 2 CKD -creatinine at baseline   Normocytic anemia- ? Blood loss in to the cytic lesions, baseline hemoglobin between 11 to 12.  Dropped in the 9 range. Continue to monitor.    Thrombocytosis- reactive.   Scheduled Meds:  enoxaparin (LOVENOX) injection  40 mg Subcutaneous Q24H   gabapentin  300 mg Oral BID   pantoprazole (PROTONIX) IV  40 mg Intravenous Q24H   Continuous Infusions:  lactated ringers with kcl 100 mL/hr at 04/14/21 0651   piperacillin-tazobactam (ZOSYN)  IV 3.375 g (04/14/21 1003)   promethazine (PHENERGAN) injection (IM or IVPB) 12.5 mg (04/12/21 1034)   PRN Meds:.acetaminophen **OR** acetaminophen, fentaNYL, fentaNYL, HYDROmorphone (DILAUDID) injection, lidocaine (PF), LORazepam, midazolam, midazolam, oxyCODONE, polyethylene glycol, promethazine (PHENERGAN) injection (IM or IVPB), simethicone, traMADol, zolpidem  Diet Orders (From admission, onward)     Start     Ordered   04/13/21 1534  Diet regular Room service appropriate? Yes; Fluid consistency: Thin  Diet effective now       Question Answer Comment  Room service appropriate? Yes   Fluid consistency: Thin      04/13/21 1533            DVT  prophylaxis: enoxaparin (LOVENOX) injection 40 mg Start: 04/09/21 1000     Code Status: Full Code  Family Communication: Husband present at the bedside  Status is: Inpatient  Remains inpatient appropriate because:IV treatments appropriate due to intensity of illness or inability to take PO and Inpatient level of care appropriate due to  severity of illness  Dispo: The patient is from: Home              Anticipated d/c is to: Home              Patient currently is not medically stable to d/c.   Difficult to place patient No  Level of care: Telemetry  Consultants:  IR General surgery   Procedures:  IR aspiration liver and kidney cysts  Microbiology  none  Antimicrobials: Zosyn 9/9 >>    Objective: Vitals:   04/14/21 0209 04/14/21 0412 04/14/21 0531 04/14/21 0753  BP: 125/77  140/70   Pulse: 72  90   Resp: 14 (!) '22 16 16  '$ Temp: 98.2 F (36.8 C)  98.1 F (36.7 C)   TempSrc: Oral  Oral   SpO2: 97% 97% 98% 97%  Weight:      Height:        Intake/Output Summary (Last 24 hours) at 04/14/2021 1059 Last data filed at 04/14/2021 0651 Gross per 24 hour  Intake 2536.76 ml  Output 1650 ml  Net 886.76 ml   Filed Weights   04/08/21 1120  Weight: 53.5 kg    Examination:  Constitutional: NAD Eyes: no scleral icterus ENMT: Mucous membranes are moist.  Neck: normal, supple Respiratory: clear to auscultation bilaterally, no wheezing, no crackles. Normal respiratory effort. No accessory muscle use.  Cardiovascular: Regular rate and rhythm, no murmurs / rubs / gallops. No LE edema. Good peripheral pulses Abdomen: Mild tenderness to palpation mainly in the lower quadrant, no guarding or rebound Musculoskeletal: no clubbing / cyanosis.  Skin: no rashes Neurologic: No focal deficits   Data Reviewed: I have independently reviewed following labs and imaging studies  CBC: Recent Labs  Lab 04/08/21 1211 04/08/21 1214 04/09/21 0532 04/10/21 0925 04/11/21 0609 04/12/21 1032 04/13/21 0441  WBC 13.6*  --  13.6* 12.6* 8.8 10.1 7.2  NEUTROABS 11.7*  --  11.9* 11.2*  --  8.7*  --   HGB 10.6*   < > 10.1* 9.5* 9.0* 9.3* 9.0*  HCT 32.7*   < > 31.1* 28.7* 27.4* 28.2* 27.7*  MCV 81.8  --  82.5 82.2 82.8 82.7 82.7  PLT 390  --  409* 415* 403* 488* 514*   < > = values in this interval not displayed.   Basic  Metabolic Panel: Recent Labs  Lab 04/09/21 0532 04/10/21 0925 04/10/21 1510 04/11/21 0609 04/12/21 1032 04/13/21 0441  NA 139 136  --  134* 132* 138  K 4.3 3.8  --  3.7 4.1 4.4  CL 102 100  --  98 92* 98  CO2 25 28  --  '27 28 31  '$ GLUCOSE 82 173*  --  100* 98 87  BUN 15 11  --  9 7 5*  CREATININE 1.27* 1.26*  --  1.15* 1.08* 1.06*  CALCIUM 8.5* 8.1*  --  8.1* 8.1* 8.5*  MG 1.4*  --  1.5*  --  1.6* 2.2   Liver Function Tests: Recent Labs  Lab 04/08/21 1211 04/09/21 0532 04/10/21 0925 04/11/21 0609 04/12/21 1032  AST 45* 44* 73* 52* 40  ALT 32 34 56* 49* 47*  ALKPHOS 106 92 98 80 94  BILITOT 0.8 0.9 0.6 0.5 0.5  PROT 7.6 6.2* 6.0* 5.6* 6.1*  ALBUMIN 3.5 2.2* 2.2* 2.0* 2.2*   Coagulation Profile: Recent Labs  Lab 04/09/21 0532 04/13/21 0441  INR 1.5* 1.1   HbA1C: No results for input(s): HGBA1C in the last 72 hours. CBG: No results for input(s): GLUCAP in the last 168 hours.  Recent Results (from the past 240 hour(s))  Culture, blood (routine x 2)     Status: None   Collection Time: 04/05/21  6:40 PM   Specimen: BLOOD  Result Value Ref Range Status   Specimen Description   Final    BLOOD RIGHT ANTECUBITAL Performed at [facility], [address] [place], [place] [address]    Special Requests   Final    Blood Culture adequate volume BOTTLES DRAWN AEROBIC AND ANAEROBIC Performed at [facility], [address] [place], [place] [address]    Culture   Final    NO GROWTH 5 DAYS Performed at [facility], [place] [address], [place], [place] [address]    Report Status 04/10/2021 FINAL  Final  Culture, blood (routine x 2)     Status: None   Collection Time: 04/05/21  6:55 PM   Specimen: BLOOD  Result Value Ref Range Status   Specimen Description   Final    BLOOD LEFT ANTECUBITAL Performed at [facility], [address] [place], [place] [address]    Special Requests   Final    Blood Culture  adequate volume BOTTLES DRAWN AEROBIC AND ANAEROBIC Performed at [facility], [address] [place], [place] [address]    Culture   Final    NO GROWTH 5 DAYS Performed at [facility], [place] [address] [place], [place] [address]    Report Status 04/10/2021 FINAL  Final  Blood culture (routine x 2)     Status: None   Collection Time: 04/08/21 12:00 PM   Specimen: BLOOD RIGHT FOREARM  Result Value Ref Range Status   Specimen Description   Final    BLOOD RIGHT FOREARM Performed at [facility], [address] [place], [place] [address]    Special Requests   Final    BOTTLES DRAWN AEROBIC AND ANAEROBIC Blood Culture adequate volume   Culture   Final    NO GROWTH 5 DAYS Performed at [facility], [place] [address] [place], [place] [address]    Report Status 04/13/2021 FINAL  Final  Blood culture (routine x 2)     Status: None   Collection Time: 04/08/21 12:09 PM   Specimen: BLOOD  Result Value Ref Range Status   Specimen Description BLOOD LEFT ANTECUBITAL  Final   Special Requests   Final    BOTTLES DRAWN AEROBIC AND ANAEROBIC Blood Culture adequate volume   Culture   Final    NO GROWTH 5 DAYS Performed at [facility], [place] [address] [place],  [address]    Report Status 04/13/2021 FINAL  Final  Resp Panel by RT-PCR (Flu A&B, Covid) Nasopharyngeal Swab     Status: None   Collection Time: 04/08/21  5:38 PM   Specimen: Nasopharyngeal Swab; Nasopharyngeal(NP) swabs in vial transport medium  Result Value Ref Range Status   SARS Coronavirus 2 by RT PCR NEGATIVE NEGATIVE Final    Comment: (NOTE) SARS-CoV-2 target nucleic acids are NOT DETECTED.  The SARS-CoV-2 RNA is generally detectable in upper respiratory specimens  during the acute phase of infection. The lowest concentration of SARS-CoV-2 viral copies this assay can detect is 138 copies/mL. A negative result does not preclude SARS-Cov-2 infection and should not  be used as the sole basis for treatment or other patient management decisions. A negative result may occur with  improper specimen collection/handling, submission of specimen other than nasopharyngeal swab, presence of viral mutation(s) within the areas targeted by this assay, and inadequate number of viral copies(<138 copies/mL). A negative result must be combined with clinical observations, patient history, and epidemiological information. The expected result is Negative.  Fact Sheet for Patients:  EntrepreneurPulse.com.au  Fact Sheet for Healthcare Providers:  IncredibleEmployment.be  This test is no t yet approved or cleared by the [place] FDA and  has been authorized for detection and/or diagnosis of SARS-CoV-2 by FDA under an Emergency Use Authorization (EUA). This EUA will remain  in effect (meaning this test can be used) for the duration of the COVID-19 declaration under Section 564(b)(1) of the Act, 21 U.S.C.section 360bbb-3(b)(1), unless the authorization is terminated  or revoked sooner.       Influenza A by PCR NEGATIVE NEGATIVE Final   Influenza B by PCR NEGATIVE NEGATIVE Final    Comment: (NOTE) The Xpert Xpress SARS-CoV-2/FLU/RSV plus assay is intended as an aid in the diagnosis of influenza from Nasopharyngeal swab specimens and should not be used as a sole basis for treatment. Nasal washings and aspirates are unacceptable for Xpert Xpress SARS-CoV-2/FLU/RSV testing.  Fact Sheet for Patients: EntrepreneurPulse.com.au  Fact Sheet for Healthcare Providers: IncredibleEmployment.be  This test is not yet approved or cleared by the [place] FDA and has been authorized for detection and/or diagnosis of SARS-CoV-2 by FDA under an Emergency Use Authorization (EUA). This EUA will remain in effect (meaning this test can be used) for the duration of the COVID-19 declaration under Section  564(b)(1) of the Act, 21 U.S.C. section 360bbb-3(b)(1), unless the authorization is terminated or revoked.  Performed at [facility], [address], [place], [place] [address]   MRSA Next Gen by PCR, Nasal     Status: None   Collection Time: 04/09/21 12:27 PM   Specimen: Nasal Mucosa; Nasal Swab  Result Value Ref Range Status   MRSA by PCR Next Gen NOT DETECTED NOT DETECTED Final    Comment: (NOTE) The GeneXpert MRSA Assay (FDA approved for NASAL specimens only), is one component of a comprehensive MRSA colonization surveillance program. It is not intended to diagnose MRSA infection nor to guide or monitor treatment for MRSA infections. Test performance is not FDA approved in patients less than 82 years old. Performed at [facility], [place] [address] [place], [place] [address]   Aerobic/Anaerobic Culture w Gram Stain (surgical/deep wound)     Status: None (Preliminary result)   Collection Time: 04/10/21  2:18 PM   Specimen: Liver; Abscess  Result Value Ref Range Status   Specimen Description   Final    LIVER CYSTS FLUID Performed at [place] [address] [place], [place] [address]    Special Requests   Final    NONE Performed at [facility], [place] [address] [place], [place] [address]    Gram Stain   Final    FEW SQUAMOUS EPITHELIAL CELLS PRESENT FEW WBC SEEN NO ORGANISMS SEEN    Culture   Final    NO GROWTH 3 DAYS Performed at [facility], [address], [place], [place] [address]  Report Status PENDING  Incomplete     Radiology Studies: [place] FINE NEEDLE ASP 1ST LESION  Result Date: 04/13/2021 INDICATION: ADPKD, with symptomatic RIGHT renal cyst. EXAM: ULTRASOUND GUIDED RIGHT RENAL CYST ASPIRATION COMPARISON:  CT pelvis, 04/05/2021. MR abdomen, 04/09/2021. Renal ultrasound, 10/15/2020 MEDICATIONS: Versed 2 mg IV; Fentanyl 100 mcg IV ANESTHESIA/SEDATION: Total Moderate  Sedation Time 13 minutes COMPLICATIONS: None immediate TECHNIQUE: Informed written consent was obtained from the patient after a discussion of the risks, benefits and alternatives to treatment. Questions regarding the procedure were encouraged and answered. A timeout was performed prior to the initiation of the procedure. The patient was positioned supine, with her RIGHT side wedged. The RIGHT flank was prepped and draped in the usual sterile fashion, and a sterile drape was applied covering the operative field. Maximum barrier sterile technique with sterile gowns and gloves were used for the procedure. A timeout was performed prior to the initiation of the procedure. Initial ultrasound scanning redemonstrated multi-cystic RIGHT kidney with dominant RIGHT inferior renal pole septated cyst. The dominant RIGHT inferior pole renal cyst was aspirated with a 5-French multi side-hole Yueh sheath needle after the overlying soft tissues was anesthetized with 1% lidocaine with epinephrine. A total of approximately 120 ml of serous fluid was aspirated. Postprocedural ultrasound imaging demonstrated decompression of the dominant inferior pole cyst with innumerable renal cysts remaining bilaterally. The patient tolerated the procedure well without immediate postprocedural complication. 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 Michaelle Birks, MD [facility] Electronically Signed   By: Michaelle Birks M.D.   On: 04/13/2021 18:08    Marzetta Board, MD, PhD [facility]  Between 7 am - 7 pm I am available, please contact me via Amion (for emergencies) or Securechat (non urgent messages)  Between 7 pm - 7 am I am not available, please contact night coverage MD/APP via Amion

## 2021-04-15 LAB — CBC
HCT: 27.9 % — ABNORMAL LOW (ref 36.0–46.0)
Hemoglobin: 8.9 g/dL — ABNORMAL LOW (ref 12.0–15.0)
MCH: 26.8 pg (ref 26.0–34.0)
MCHC: 31.9 g/dL (ref 30.0–36.0)
MCV: 84 fL (ref 80.0–100.0)
Platelets: 468 10*3/uL — ABNORMAL HIGH (ref 150–400)
RBC: 3.32 MIL/uL — ABNORMAL LOW (ref 3.87–5.11)
RDW: 13.7 % (ref 11.5–15.5)
WBC: 6.2 10*3/uL (ref 4.0–10.5)
nRBC: 0 % (ref 0.0–0.2)

## 2021-04-15 LAB — BASIC METABOLIC PANEL
Anion gap: 6 (ref 5–15)
BUN: <5 mg/dL — ABNORMAL LOW (ref 6–20)
CO2: 28 mmol/L (ref 22–32)
Calcium: 8.2 mg/dL — ABNORMAL LOW (ref 8.9–10.3)
Chloride: 105 mmol/L (ref 98–111)
Creatinine, Ser: 1.28 mg/dL — ABNORMAL HIGH (ref 0.44–1.00)
GFR, Estimated: 50 mL/min — ABNORMAL LOW (ref >60)
Glucose, Bld: 120 mg/dL — ABNORMAL HIGH (ref 70–99)
Potassium: 4 mmol/L (ref 3.5–5.1)
Sodium: 139 mmol/L (ref 135–145)

## 2021-04-15 LAB — URINE CULTURE
Culture: NO GROWTH
Special Requests: NORMAL

## 2021-04-15 MED ORDER — OXYCODONE HCL 5 MG PO TABS: 5.0000 mg | ORAL_TABLET | ORAL | Status: DC | PRN

## 2021-04-15 MED ORDER — PANTOPRAZOLE SODIUM 40 MG PO TBEC
40.0000 mg | DELAYED_RELEASE_TABLET | Freq: Every day | ORAL | Status: DC
  Administered 2021-04-15 – 2021-04-16 (×2): 40 mg via ORAL
  Filled 2021-04-15 (×2): qty 1

## 2021-04-15 MED ORDER — SIMETHICONE 80 MG PO CHEW: 80.0000 mg | CHEWABLE_TABLET | Freq: Four times a day (QID) | ORAL | Status: DC | PRN

## 2021-04-15 NOTE — Progress Notes (Signed)
Physical Therapy Treatment Patient Details Name: Judy Manning MRN: [id number] DOB: [birth year] Today's Date: 04/15/2021   History of Present Illness 55 year old lady with prior history of polycystic kidney disease, hypertension, migraine headaches presents with right flank pain right-sided abdominal pain started about 2 weeks ago associated with fevers and some nausea and vomiting. CT-Polycystic liver disease, large cysts with internal debris.Status post right hepatic dome cyst drainage by IR performed 04/10/21.  S/P ASPIRATION OF DOMINANT RIGHT RENAL CYST 04/13/21    PT Comments    The patient reports now ambulating to bathroom, eager to progress into hall.  Patient ambulated x 200' using Rw, gait is slow and steady. Patient will attempt ambulation without use of  RW next opportunity.   Patient hopes to go home tomorrow.  HR max 134 while ambulating. Pain  reported as minimal.  Recommendations for follow up therapy are one component of a multi-disciplinary discharge planning process, led by the attending physician.  Recommendations may be updated based on patient status, additional functional criteria and insurance authorization.  Follow Up Recommendations  No PT follow up     Equipment Recommendations     none  Recommendations for Other Services       Precautions / Restrictions Precautions Precautions: Fall     Mobility  Bed Mobility               General bed mobility comments: in recliner    Transfers Overall transfer level: Needs assistance   Transfers: Sit to/from Stand Sit to Stand: Supervision            Ambulation/Gait Ambulation/Gait assistance: Min guard Gait Distance (Feet): 200 Feet Assistive device: Rolling walker (2 wheeled) Gait Pattern/deviations: Step-through pattern Gait velocity: decr   General Gait Details: gait is steady, no dizziness   Stairs             Wheelchair Mobility    Modified Rankin (Stroke Patients Only)        Balance Overall balance assessment: Mild deficits observed, not formally tested                                          Cognition Arousal/Alertness: Awake/alert Behavior During Therapy: WFL for tasks assessed/performed Overall Cognitive Status: Within Functional Limits for tasks assessed                                        Exercises      General Comments        Pertinent Vitals/Pain Faces Pain Scale: Hurts a little bit Pain Location: back right side Pain Descriptors / Indicators: Discomfort Pain Intervention(s): Monitored during session    Home Living                      Prior Function            PT Goals (current goals can now be found in the care plan section) Progress towards PT goals: Progressing toward goals    Frequency    Min 3X/week      PT Plan Current plan remains appropriate    Co-evaluation              AM-PAC PT "6 Clicks" Mobility   Outcome Measure  Help needed turning from your back to your  side while in a flat bed without using bedrails?: A Little Help needed moving from lying on your back to sitting on the side of a flat bed without using bedrails?: A Little Help needed moving to and from a bed to a chair (including a wheelchair)?: A Little Help needed standing up from a chair using your arms (e.g., wheelchair or bedside chair)?: A Little Help needed to walk in hospital room?: A Little Help needed climbing 3-5 steps with a railing? : A Little 6 Click Score: 18    End of Session   Activity Tolerance: Patient tolerated treatment well Patient left: in chair;with call bell/phone within reach Nurse Communication: Mobility status PT Visit Diagnosis: Unsteadiness on feet (R26.81);Difficulty in walking, not elsewhere classified (R26.2)     Time: 1100-1120 PT Time Calculation (min) (ACUTE ONLY): 20 min  Charges:  $Gait Training: 8-22 mins                     Tresa Endo  PT Acute Rehabilitation Services Pager [phone] Office [phone]    Claretha Cooper 04/15/2021, 1:47 PM

## 2021-04-15 NOTE — Progress Notes (Signed)
Subjective: Patient is ambulating now, was able to walk to the restroom, states the pain is better controlled, has been able to tolerate solids without nausea, continues to have multiple episodes of loose bowel movements.  Objective: Vital signs in last 24 hours: Temp:  [98.2 F (36.8 C)-98.7 F (37.1 C)] 98.7 F (37.1 C) (09/15 0504) Pulse Rate:  [87-101] 87 (09/15 0504) Resp:  [16-20] 16 (09/15 0504) BP: (114-123)/(48-78) 114/66 (09/15 0504) SpO2:  [95 %-100 %] 96 % (09/15 0504) Weight change:  Last BM Date: 04/14/21  PE: More cheerful than any other day, not in distress, does not appear to be in pain GENERAL: Mild pallor, no icterus  ABDOMEN: Distended especially upper abdomen, mild right upper quadrant and right flank tenderness, normoactive bowel sounds EXTREMITIES: No deformity  Lab Results: Results for orders placed or performed during the hospital encounter of 04/08/21 (from the past 48 hour(s))  Urine Culture     Status: None   Collection Time: 04/14/21  4:00 AM   Specimen: Urine, Clean Catch  Result Value Ref Range   Specimen Description      URINE, CLEAN CATCH Performed at [facility], [place] [address], [place], [place] [address]    Special Requests      Normal Performed at [facility], [place] [address], [place], [place] [address]    Culture      NO GROWTH Performed at [facility], [place] [address] [place], [place] [address]    Report Status 04/15/2021 FINAL   C Difficile Quick Screen (NO PCR Reflex)     Status: None   Collection Time: 04/14/21  7:33 PM   Specimen: STOOL  Result Value Ref Range   C Diff antigen NEGATIVE NEGATIVE   C Diff toxin NEGATIVE NEGATIVE   C Diff interpretation No C. difficile detected.     Comment: Performed at [facility], [place] [address], [place], [place] [id number]  Basic metabolic panel     Status: Abnormal   Collection Time: 04/15/21  5:34 AM  Result Value Ref Range    Sodium 139 135 - 145 mmol/L   Potassium 4.0 3.5 - 5.1 mmol/L   Chloride 105 98 - 111 mmol/L   CO2 28 22 - 32 mmol/L   Glucose, Bld 120 (H) 70 - 99 mg/dL    Comment: Glucose reference range applies only to samples taken after fasting for at least 8 hours.   BUN <5 (L) 6 - 20 mg/dL   Creatinine, Ser 1.28 (H) 0.44 - 1.00 mg/dL   Calcium 8.2 (L) 8.9 - 10.3 mg/dL   GFR, Estimated 50 (L) >60 mL/min    Comment: (NOTE) Calculated using the CKD-EPI Creatinine Equation (2021)    Anion gap 6 5 - 15    Comment: Performed at [facility], [place] [address], [place],  [address]  CBC     Status: Abnormal   Collection Time: 04/15/21  5:34 AM  Result Value Ref Range   WBC 6.2 4.0 - 10.5 K/uL   RBC 3.32 (L) 3.87 - 5.11 MIL/uL   Hemoglobin 8.9 (L) 12.0 - 15.0 g/dL   HCT 27.9 (L) 36.0 - 46.0 %   MCV 84.0 80.0 - 100.0 fL   MCH 26.8 26.0 - 34.0 pg   MCHC 31.9 30.0 - 36.0 g/dL   RDW 13.7 11.5 - 15.5 %   Platelets 468 (H) 150 - 400 K/uL   nRBC 0.0 0.0 - 0.2 %  Comment: Performed at [facility], [place] [address], [place], [place] [address]    Studies/Results: [place] FINE NEEDLE ASP 1ST LESION  Result Date: 04/13/2021 INDICATION: ADPKD, with symptomatic RIGHT renal cyst. EXAM: ULTRASOUND GUIDED RIGHT RENAL CYST ASPIRATION COMPARISON:  CT pelvis, 04/05/2021. MR abdomen, 04/09/2021. Renal ultrasound, 10/15/2020 MEDICATIONS: Versed 2 mg IV; Fentanyl 100 mcg IV ANESTHESIA/SEDATION: Total Moderate Sedation Time 13 minutes COMPLICATIONS: None immediate TECHNIQUE: Informed written consent was obtained from the patient after a discussion of the risks, benefits and alternatives to treatment. Questions regarding the procedure were encouraged and answered. A timeout was performed prior to the initiation of the procedure. The patient was positioned supine, with her RIGHT side wedged. The RIGHT flank was prepped and draped in the usual sterile fashion, and a sterile drape was  applied covering the operative field. Maximum barrier sterile technique with sterile gowns and gloves were used for the procedure. A timeout was performed prior to the initiation of the procedure. Initial ultrasound scanning redemonstrated multi-cystic RIGHT kidney with dominant RIGHT inferior renal pole septated cyst. The dominant RIGHT inferior pole renal cyst was aspirated with a 5-French multi side-hole Yueh sheath needle after the overlying soft tissues was anesthetized with 1% lidocaine with epinephrine. A total of approximately 120 ml of serous fluid was aspirated. Postprocedural ultrasound imaging demonstrated decompression of the dominant inferior pole cyst with innumerable renal cysts remaining bilaterally. The patient tolerated the procedure well without immediate postprocedural complication. 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 Judy Birks, MD [facility] Radiology Electronically Signed   By: Judy Manning M.D.   On: 04/13/2021 18:08    Medications: I have reviewed the patient's current medications.  Assessment: Polycystic liver and kidney disease, status post drainage of 1 dominant liver cyst and 1 dominant kidney cyst, no growth noted in cyst fluid Currently afebrile, normal WBC count, mild anemia and thrombocytosis, creatinine 1.28 with a GFR of 50 Diarrhea, stool for C. difficile negative Distended abdomen-possible gaseous distention, no associated nausea/vomiting and has been having multiple loose bowel movements, no signs of obstruction  Plan: Empiric treatment on cefdinir and metronidazole as an outpatient for suspected infection of cysts Eventually will need to be seen as an outpatient at a tertiary center such as [facility] or [facility], where there is  availability of liver and kidney transplant Encourage ambulation, adequate pain control Simethicone up to 4 times a day as needed for bloating. Likely discharge in a.m.Marland Kitchen  Ronnette Juniper, MD 04/15/2021, 12:04 PM

## 2021-04-15 NOTE — Progress Notes (Signed)
Mobility Specialist - Cancellation    04/15/21 1650  Mobility  Activity Refused mobility   Cancellation: Pt refused mobility at this time. Prefers to rest and eat dinner. Will check back as schedule permits.   [place] Specialist Acute Rehabilitation Services Phone: [phone] 04/15/21, 4:51 PM

## 2021-04-15 NOTE — Progress Notes (Signed)
PROGRESS NOTE  Judy Manning [id number] DOB: [birth year] DOA: 04/08/2021 PCP: Eunice Blase, MD   LOS: 7 days   Brief Narrative / Interim history: 55 year old female with history of polycystic kidney disease, hypertension, migraine headaches comes in with a little sudden right flank and right-sided abdominal pain that started about couple of weeks ago and is progressively gotten worse.  She underwent an MRI of the abdomen which showed mildly dilated CBD of 8 mm.  Due to severity of the pain she was placed on Dilaudid PCA.  General surgery as well as IR consulted.  She underwent an aspiration of one of the liver cysts on 9/10 as well as aspiration of the renal cyst on 9/13.  Subjective / 24h Interval events: Pain is better today, no nausea, no vomiting.  Assessment & Plan: Principal Problem Right upper quadrant pain, right flank pain / SIRS on admission-MRI of the abdomen showed innumerable hepatic lesions, combination of simple cysts and biliary hamartomas, with some of the larger cyst demonstrating varying degrees of complexity.  One of the loculations of the cyst also contain some findings concerning for proteinaceous/hemorrhagic debris.  CBD was dilated to 11 mm at the porta hepatis -IR consulted, underwent aspiration of 1 liver cyst as well as renal cyst, no clear-cut evidence of infected material -She was febrile to 101.6 on admission, had an elevated WBC raising concern whether some of the small cysts can actually be infected.  She was placed on Zosyn with improvement in her WBC as well as fever curve.  Case was discussed with ID, Dr. Drucilla Schmidt, difficult to fully rule out infection, he recommends Augmentin for a month then repeat CT scan as an outpatient.  She could not tolerate Augmentin and will place on cefdinir as well as metronidazole -She was weaned off Dilaudid PCA pump yesterday, pain much better and really has not required any oxygen overnight.  Mobilize more today  Active  Problems Polycystic kidney disease-she has been off Jynarque/ Tolvapton for couple of weeks since the onset of fevers, follows with Dr. Justin Mend as an outpatient, discussed with him over the phone this morning  Hypomagnesemia and hypokalemia -continue to monitor and replace   Elevated troponin  -Suspect from demand ischemia from SIRS. Repeat EKG shows sinus tachycardia without any ischemic changes. She denies any chest pain. Echocardiogram unremarkable.    Stage 2 CKD -creatinine at baseline   Normocytic anemia- ? Blood loss in to the cytic lesions, baseline hemoglobin between 11 to 12.  Dropped in the 9 range.  Monitor, overall stable  Thrombocytosis- reactive.   Scheduled Meds:  cefdinir  300 mg Oral Q12H   enoxaparin (LOVENOX) injection  40 mg Subcutaneous Q24H   feeding supplement  237 mL Oral BID BM   gabapentin  300 mg Oral BID   metroNIDAZOLE  500 mg Oral Q12H   pantoprazole (PROTONIX) IV  40 mg Intravenous Q24H   Continuous Infusions:  promethazine (PHENERGAN) injection (IM or IVPB) 12.5 mg (04/12/21 1034)   PRN Meds:.acetaminophen **OR** acetaminophen, HYDROmorphone (DILAUDID) injection, lidocaine (PF), LORazepam, midazolam, midazolam, oxyCODONE, promethazine (PHENERGAN) injection (IM or IVPB), simethicone, traMADol, zolpidem  Diet Orders (From admission, onward)     Start     Ordered   04/13/21 1534  Diet regular Room service appropriate? Yes; Fluid consistency: Thin  Diet effective now       Question Answer Comment  Room service appropriate? Yes   Fluid consistency: Thin      04/13/21 1533  DVT prophylaxis: enoxaparin (LOVENOX) injection 40 mg Start: 04/09/21 1000     Code Status: Full Code  Family Communication: Husband present at the bedside  Status is: Inpatient  Remains inpatient appropriate because:IV treatments appropriate due to intensity of illness or inability to take PO and Inpatient level of care appropriate due to severity of  illness  Dispo: The patient is from: Home              Anticipated d/c is to: Home              Patient currently is not medically stable to d/c.   Difficult to place patient No  Level of care: Telemetry  Consultants:  IR General surgery   Procedures:  IR aspiration liver and kidney cysts  Microbiology  none  Antimicrobials: Zosyn 9/9 >>    Objective: Vitals:   04/14/21 1432 04/14/21 2033 04/14/21 2222 04/15/21 0504  BP: 123/78 (!) 121/48 119/63 114/66  Pulse: 93 (!) 101  87  Resp: '20 18  16  '$ Temp: 98.2 F (36.8 C) 98.2 F (36.8 C)  98.7 F (37.1 C)  TempSrc: Oral Oral  Oral  SpO2: 95% 100%  96%  Weight:      Height:        Intake/Output Summary (Last 24 hours) at 04/15/2021 1142 Last data filed at 04/15/2021 0935 Gross per 24 hour  Intake 3260.21 ml  Output 3600 ml  Net -339.79 ml    Filed Weights   04/08/21 1120  Weight: 53.5 kg    Examination:  Constitutional: NAD Eyes: No icterus ENMT: mmm Neck: normal, supple Respiratory: Clear bilaterally, no wheezing or crackles heard. Cardiovascular: Regular rate and rhythm, no murmurs, no edema Abdomen: Mild tenderness in the lower quadrant, no guarding or rebound Musculoskeletal: no clubbing / cyanosis.  Skin: No rashes seen Neurologic: Nonfocal   Data Reviewed: I have independently reviewed following labs and imaging studies  CBC: Recent Labs  Lab 04/08/21 1211 04/08/21 1214 04/09/21 0532 04/10/21 0925 04/11/21 0609 04/12/21 1032 04/13/21 0441 04/15/21 0534  WBC 13.6*  --  13.6* 12.6* 8.8 10.1 7.2 6.2  NEUTROABS 11.7*  --  11.9* 11.2*  --  8.7*  --   --   HGB 10.6*   < > 10.1* 9.5* 9.0* 9.3* 9.0* 8.9*  HCT 32.7*   < > 31.1* 28.7* 27.4* 28.2* 27.7* 27.9*  MCV 81.8  --  82.5 82.2 82.8 82.7 82.7 84.0  PLT 390  --  409* 415* 403* 488* 514* 468*   < > = values in this interval not displayed.    Basic Metabolic Panel: Recent Labs  Lab 04/09/21 0532 04/10/21 0925 04/10/21 1510  04/11/21 0609 04/12/21 1032 04/13/21 0441 04/15/21 0534  NA 139 136  --  134* 132* 138 139  K 4.3 3.8  --  3.7 4.1 4.4 4.0  CL 102 100  --  98 92* 98 105  CO2 25 28  --  '27 28 31 28  '$ GLUCOSE 82 173*  --  100* 98 87 120*  BUN 15 11  --  9 7 5* <5*  CREATININE 1.27* 1.26*  --  1.15* 1.08* 1.06* 1.28*  CALCIUM 8.5* 8.1*  --  8.1* 8.1* 8.5* 8.2*  MG 1.4*  --  1.5*  --  1.6* 2.2  --     Liver Function Tests: Recent Labs  Lab 04/08/21 1211 04/09/21 0532 04/10/21 0925 04/11/21 0609 04/12/21 1032  AST 45* 44* 73* 52* 40  ALT  32 34 56* 49* 47*  ALKPHOS 106 92 98 80 94  BILITOT 0.8 0.9 0.6 0.5 0.5  PROT 7.6 6.2* 6.0* 5.6* 6.1*  ALBUMIN 3.5 2.2* 2.2* 2.0* 2.2*    Coagulation Profile: Recent Labs  Lab 04/09/21 0532 04/13/21 0441  INR 1.5* 1.1    HbA1C: No results for input(s): HGBA1C in the last 72 hours. CBG: No results for input(s): GLUCAP in the last 168 hours.  Recent Results (from the past 240 hour(s))  Culture, blood (routine x 2)     Status: None   Collection Time: 04/05/21  6:40 PM   Specimen: BLOOD  Result Value Ref Range Status   Specimen Description   Final    BLOOD RIGHT ANTECUBITAL Performed at [facility], [address] [place], [place] [address]    Special Requests   Final    Blood Culture adequate volume BOTTLES DRAWN AEROBIC AND ANAEROBIC Performed at [facility], [address] [place], [place] [address]    Culture   Final    NO GROWTH 5 DAYS Performed at [facility], [place] [address], [place], [place] [address]    Report Status 04/10/2021 FINAL  Final  Culture, blood (routine x 2)     Status: None   Collection Time: 04/05/21  6:55 PM   Specimen: BLOOD  Result Value Ref Range Status   Specimen Description   Final    BLOOD LEFT ANTECUBITAL Performed at [facility], [address] [place], [place] [address]    Special Requests   Final    Blood Culture adequate volume  BOTTLES DRAWN AEROBIC AND ANAEROBIC Performed at [facility], [address], [place], [place] [address]    Culture   Final    NO GROWTH 5 DAYS Performed at [facility], [place] [address], [place], [place] [address]    Report Status 04/10/2021 FINAL  Final  Blood culture (routine x 2)     Status: None   Collection Time: 04/08/21 12:00 PM   Specimen: BLOOD RIGHT FOREARM  Result Value Ref Range Status   Specimen Description   Final    BLOOD RIGHT FOREARM Performed at [facility], [address] [place], [place] [address]    Special Requests   Final    BOTTLES DRAWN AEROBIC AND ANAEROBIC Blood Culture adequate volume   Culture   Final    NO GROWTH 5 DAYS Performed at [facility], [place] [address] [place], [place] [address]    Report Status 04/13/2021 FINAL  Final  Blood culture (routine x 2)     Status: None   Collection Time: 04/08/21 12:09 PM   Specimen: BLOOD  Result Value Ref Range Status   Specimen Description BLOOD LEFT ANTECUBITAL  Final   Special Requests   Final    BOTTLES DRAWN AEROBIC AND ANAEROBIC Blood Culture adequate volume   Culture   Final    NO GROWTH 5 DAYS Performed at [facility], [place] [address], [place],  [address]    Report Status 04/13/2021 FINAL  Final  Resp Panel by RT-PCR (Flu A&B, Covid) Nasopharyngeal Swab     Status: None   Collection Time: 04/08/21  5:38 PM   Specimen: Nasopharyngeal Swab; Nasopharyngeal(NP) swabs in vial transport medium  Result Value Ref Range Status   SARS Coronavirus 2 by RT PCR NEGATIVE NEGATIVE Final    Comment: (NOTE) SARS-CoV-2 target nucleic acids are NOT DETECTED.  The SARS-CoV-2  RNA is generally detectable in upper respiratory specimens during the acute phase of infection. The lowest concentration of SARS-CoV-2 viral copies this assay can detect is 138 copies/mL. A negative result does not preclude SARS-Cov-2 infection and should not be used as the  sole basis for treatment or other patient management decisions. A negative result may occur with  improper specimen collection/handling, submission of specimen other than nasopharyngeal swab, presence of viral mutation(s) within the areas targeted by this assay, and inadequate number of viral copies(<138 copies/mL). A negative result must be combined with clinical observations, patient history, and epidemiological information. The expected result is Negative.  Fact Sheet for Patients:  EntrepreneurPulse.com.au  Fact Sheet for Healthcare Providers:  IncredibleEmployment.be  This test is no t yet approved or cleared by the [place] FDA and  has been authorized for detection and/or diagnosis of SARS-CoV-2 by FDA under an Emergency Use Authorization (EUA). This EUA will remain  in effect (meaning this test can be used) for the duration of the COVID-19 declaration under Section 564(b)(1) of the Act, 21 U.S.C.section 360bbb-3(b)(1), unless the authorization is terminated  or revoked sooner.       Influenza A by PCR NEGATIVE NEGATIVE Final   Influenza B by PCR NEGATIVE NEGATIVE Final    Comment: (NOTE) The Xpert Xpress SARS-CoV-2/FLU/RSV plus assay is intended as an aid in the diagnosis of influenza from Nasopharyngeal swab specimens and should not be used as a sole basis for treatment. Nasal washings and aspirates are unacceptable for Xpert Xpress SARS-CoV-2/FLU/RSV testing.  Fact Sheet for Patients: EntrepreneurPulse.com.au  Fact Sheet for Healthcare Providers: IncredibleEmployment.be  This test is not yet approved or cleared by the [place] FDA and has been authorized for detection and/or diagnosis of SARS-CoV-2 by FDA under an Emergency Use Authorization (EUA). This EUA will remain in effect (meaning this test can be used) for the duration of the COVID-19 declaration under Section 564(b)(1) of the  Act, 21 U.S.C. section 360bbb-3(b)(1), unless the authorization is terminated or revoked.  Performed at [facility], [address] [place], [place] [address]   MRSA Next Gen by PCR, Nasal     Status: None   Collection Time: 04/09/21 12:27 PM   Specimen: Nasal Mucosa; Nasal Swab  Result Value Ref Range Status   MRSA by PCR Next Gen NOT DETECTED NOT DETECTED Final    Comment: (NOTE) The GeneXpert MRSA Assay (FDA approved for NASAL specimens only), is one component of a comprehensive MRSA colonization surveillance program. It is not intended to diagnose MRSA infection nor to guide or monitor treatment for MRSA infections. Test performance is not FDA approved in patients less than 32 years old. Performed at [facility], [place] [address], [place], [place] [address]   Aerobic/Anaerobic Culture w Gram Stain (surgical/deep wound)     Status: None (Preliminary result)   Collection Time: 04/10/21  2:18 PM   Specimen: Liver; Abscess  Result Value Ref Range Status   Specimen Description   Final    LIVER CYSTS FLUID Performed at [place] [address], [place], [place] [address]    Special Requests   Final    NONE Performed at [facility], [place] [address], [place], [place] [address]    Gram Stain   Final    FEW SQUAMOUS EPITHELIAL CELLS PRESENT FEW WBC SEEN NO ORGANISMS SEEN    Culture   Final    NO GROWTH 4 DAYS NO ANAEROBES ISOLATED; CULTURE IN  PROGRESS FOR 5 DAYS Performed at [facility], [place] [address], [place], [place] [address]    Report Status PENDING  Incomplete  Urine Culture     Status: None   Collection Time: 04/14/21  4:00 AM   Specimen: Urine, Clean Catch  Result Value Ref Range Status   Specimen Description   Final    URINE, CLEAN CATCH Performed at [facility], [place] [address] [place], [place] [address]    Special Requests   Final    Normal Performed  at [facility], [place] [address], [place], [place] [address]    Culture   Final    NO GROWTH Performed at [facility], [place] [address] [place], [place] [address]    Report Status 04/15/2021 FINAL  Final  C Difficile Quick Screen (NO PCR Reflex)     Status: None   Collection Time: 04/14/21  7:33 PM   Specimen: STOOL  Result Value Ref Range Status   C Diff antigen NEGATIVE NEGATIVE Final   C Diff toxin NEGATIVE NEGATIVE Final   C Diff interpretation No C. difficile detected.  Final    Comment: Performed at [facility], [place] [address], [place], [place] [address]      Radiology Studies: No results found.  Marzetta Board, MD, PhD [facility]  Between 7 am - 7 pm I am available, please contact me via Amion (for emergencies) or Securechat (non urgent messages)  Between 7 pm - 7 am I am not available, please contact night coverage MD/APP via Amion

## 2021-04-16 ENCOUNTER — Inpatient Hospital Stay (HOSPITAL_COMMUNITY): Payer: 59

## 2021-04-16 IMAGING — DX DG ABDOMEN 1V
1 series · 1 of 1 positions shown · non-contrast
Comparison: None.

CLINICAL DATA: Abdominal bloating

EXAM:
ABDOMEN - 1 VIEW

[abdomen kub]
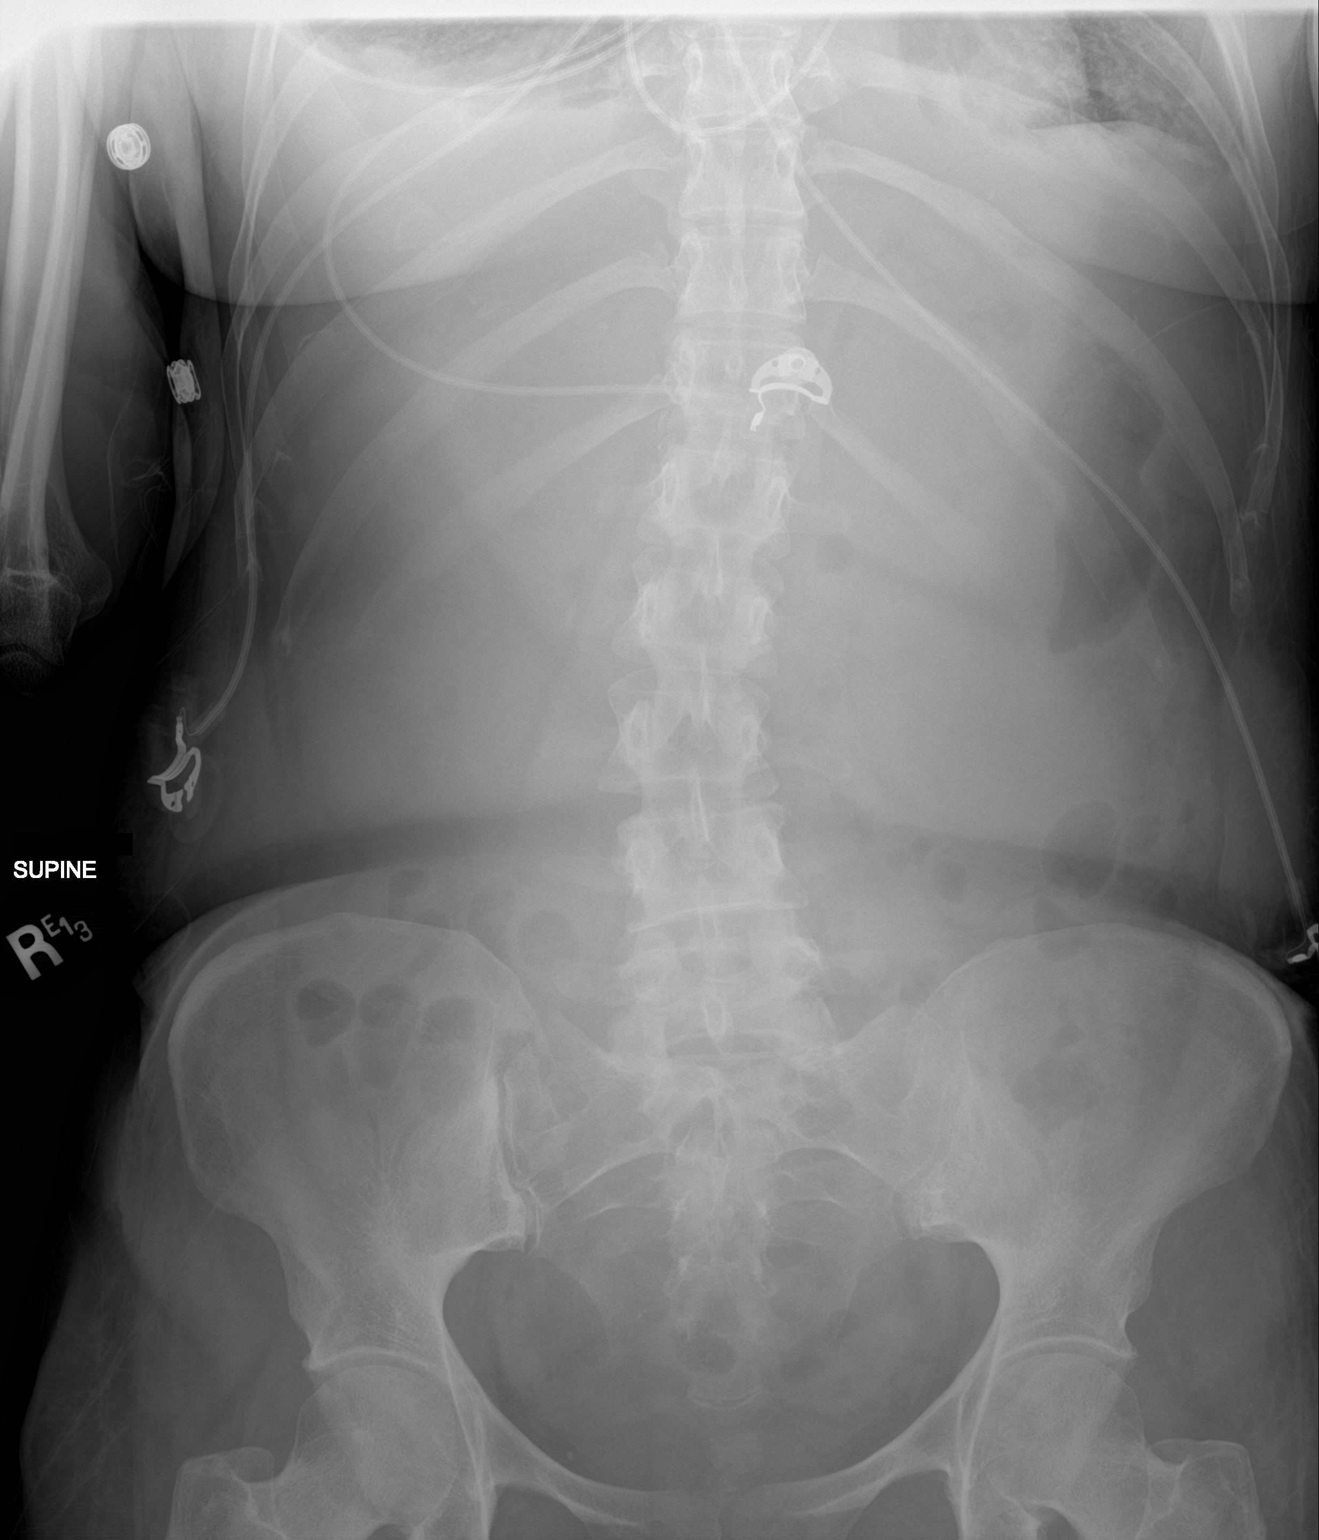

[1 of 1 positions shown; findings below may reference images not displayed]

FINDINGS: Nonobstructed gas pattern without significant stool burden. No
radiopaque calculi. Scoliosis.
IMPRESSION: Nonobstructed gas pattern without significant stool burden

## 2021-04-16 IMAGING — DX DG CHEST 2V
2 series · 2 of 2 positions shown · non-contrast
Comparison: [DATE]

CLINICAL DATA: Pt was admitted [DATE] with a diagnosis of "Right
Upper Quadrant Pain". Patient is now s/p aspiration of renal and
hepatic cysts. CC this AM is a persistent, dry, non-productive
cough, as well as diarrhea.

EXAM:
CHEST - 2 VIEW

[chest pa]
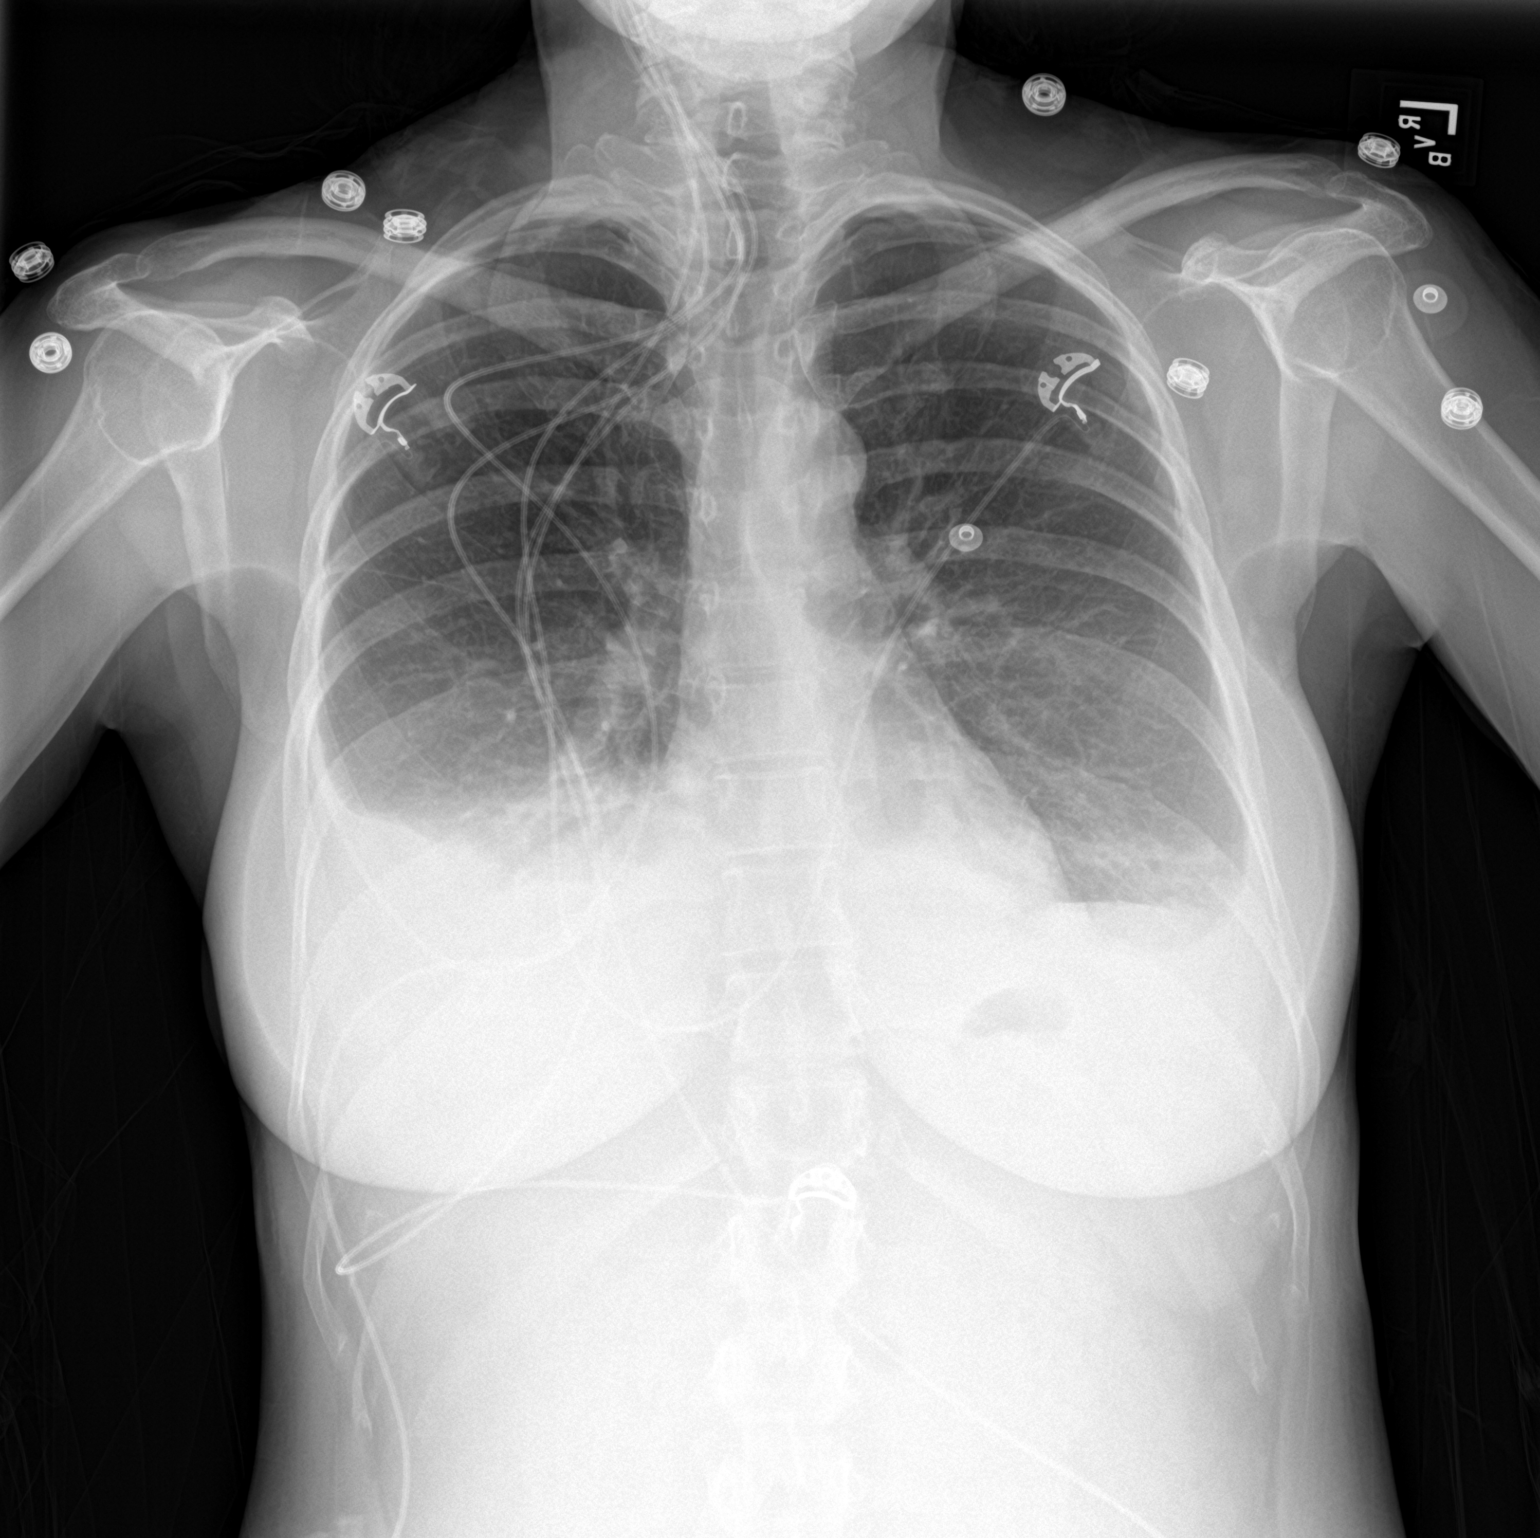

[chest lat]
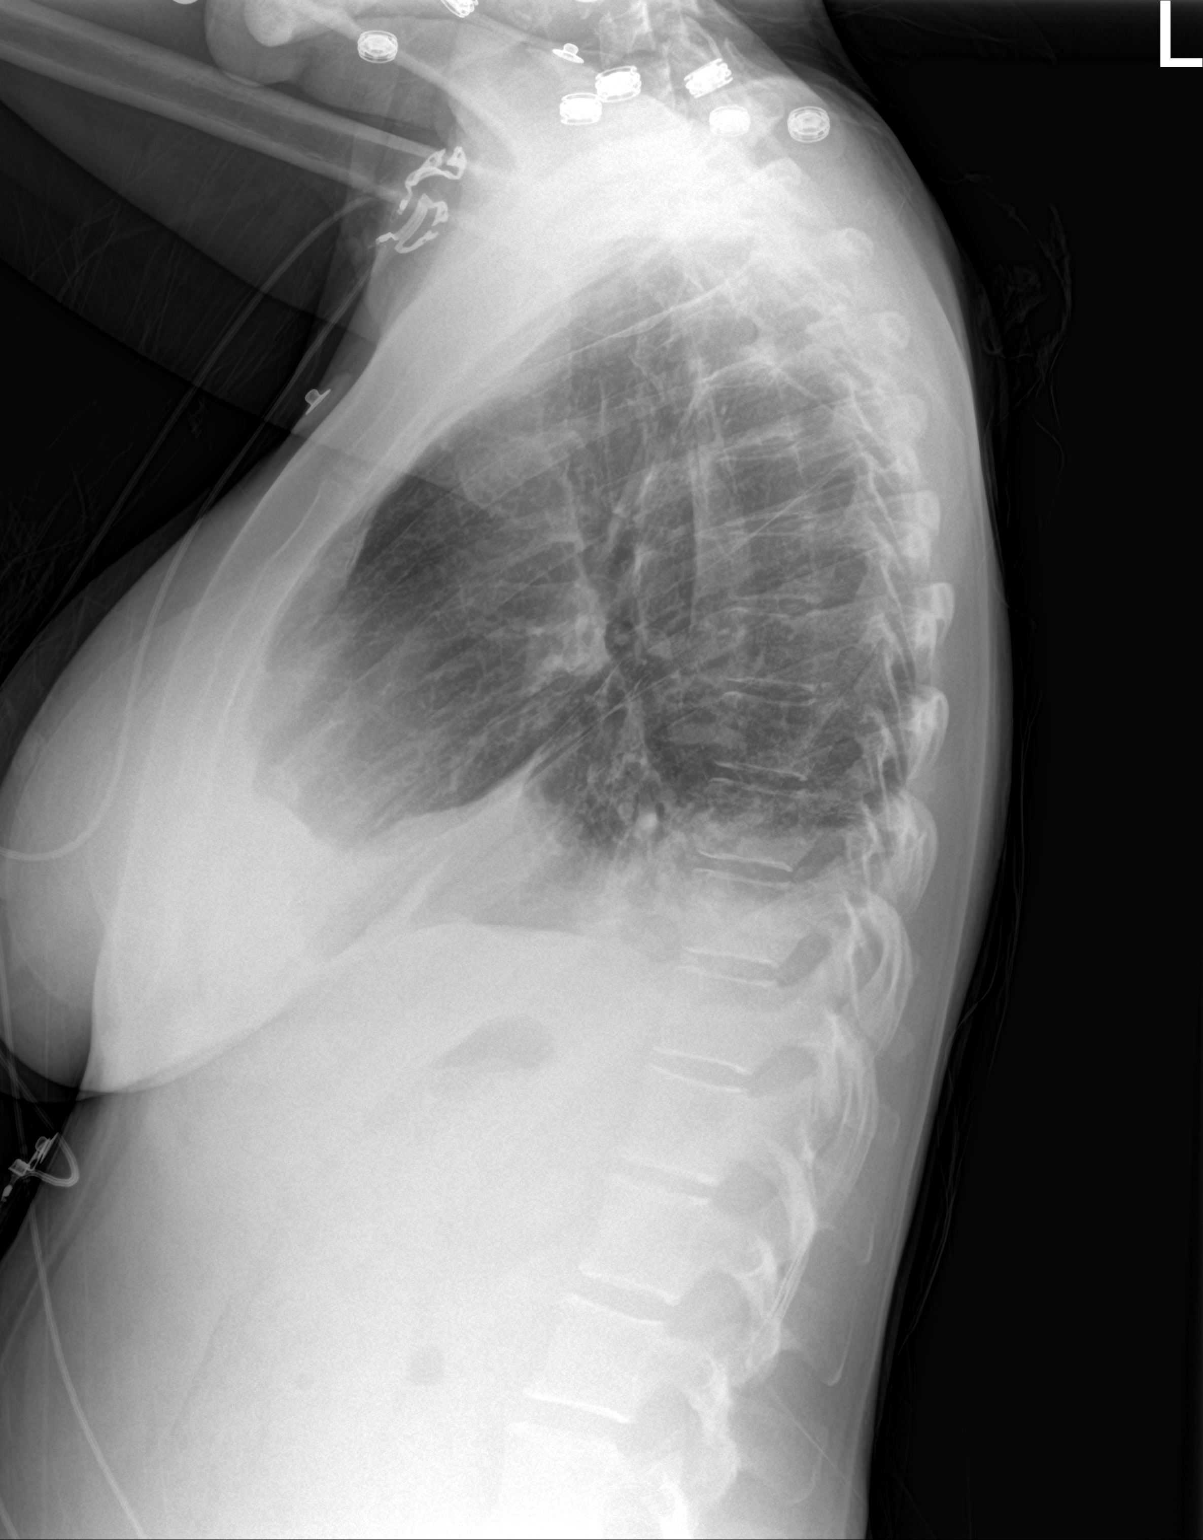

[2 of 2 positions shown; findings below may reference images not displayed]

FINDINGS: New/worsening pleural effusions, right greater than left. Patchy
atelectasis or infiltrate in lung bases.

Heart size remains normal.

No pneumothorax.

Visualized bones unremarkable.
IMPRESSION: Pleural effusions right-greater-than-left with worsening bibasilar
consolidation/atelectasis.

## 2021-04-16 MED ORDER — FUROSEMIDE 10 MG/ML IJ SOLN
40.0000 mg | Freq: Once | INTRAMUSCULAR | Status: AC
  Filled 2021-04-16: qty 4

## 2021-04-16 MED ORDER — GUAIFENESIN-DM 100-10 MG/5ML PO SYRP
5.0000 mL | ORAL_SOLUTION | ORAL | Status: DC | PRN
  Administered 2021-04-16: 5 mL via ORAL
  Filled 2021-04-16: qty 10

## 2021-04-16 MED ORDER — LOPERAMIDE HCL 2 MG PO CAPS
2.0000 mg | ORAL_CAPSULE | ORAL | Status: DC | PRN
  Administered 2021-04-16: 2 mg via ORAL
  Filled 2021-04-16: qty 1

## 2021-04-16 MED ORDER — FUROSEMIDE 10 MG/ML IJ SOLN
INTRAMUSCULAR | Status: AC
  Administered 2021-04-16: 40 mg via INTRAVENOUS
  Filled 2021-04-16: qty 4

## 2021-04-16 NOTE — Plan of Care (Signed)
54/F admitted 04/08/21 with a diagnosis of "Right Upper Quadrant Pain". Patient is now s/p aspiration of renal and hepatic cysts. CC this AM is a persistent, dry, non-productive cough, as well as diarrhea. Patient also reporting profuse night sweats overnight which prevented sleep, and that she is now "tired".   Problem: Education: Goal: Knowledge of General Education information will improve Description: Including pain rating scale, medication(s)/side effects and non-pharmacologic comfort measures Outcome: Progressing   Problem: Health Behavior/Discharge Planning: Goal: Ability to manage health-related needs will improve Outcome: Progressing   Problem: Clinical Measurements: Goal: Ability to maintain clinical measurements within normal limits will improve Outcome: Progressing Goal: Will remain free from infection Outcome: Progressing Goal: Diagnostic test results will improve Outcome: Progressing Goal: Respiratory complications will improve Outcome: Progressing Goal: Cardiovascular complication will be avoided Outcome: Progressing   Problem: Activity: Goal: Risk for activity intolerance will decrease Outcome: Progressing   Problem: Nutrition: Goal: Adequate nutrition will be maintained Outcome: Progressing   Problem: Coping: Goal: Level of anxiety will decrease Outcome: Progressing   Problem: Elimination: Goal: Will not experience complications related to bowel motility Outcome: Progressing Goal: Will not experience complications related to urinary retention Outcome: Progressing   Problem: Pain Managment: Goal: General experience of comfort will improve Outcome: Progressing   Problem: Safety: Goal: Ability to remain free from injury will improve Outcome: Progressing   Problem: Skin Integrity: Goal: Risk for impaired skin integrity will decrease Outcome: Progressing

## 2021-04-16 NOTE — Progress Notes (Signed)
   04/16/21 1400  Mobility  Activity Ambulated in hall  Level of Assistance Modified independent, requires aide device or extra time  Assistive Device None  Distance Ambulated (ft) 380 ft  Mobility Ambulated with assistance in hallway  Mobility Response Tolerated well  Mobility performed by Mobility specialist  $Mobility charge 1 Mobility   Upon entering, pt was eager to mobilize but nervous about voiding since she just received Lasix. Assured pt that she would not need to travel far from room, pt agreed to ambulate. Pt ambulated about 34f in the hallway with no device. She noted that she has independently used the restroom many times during the day today. No complaints. Left pt in chair with mother present and call bell at side.    KModestoSpecialist Acute Rehab Services Office: [phone]

## 2021-04-16 NOTE — Progress Notes (Signed)
Subjective: Patient is ambulating now, was able to walk to the restroom, states the pain is better controlled, has not needed even oral pain medications per patient.  She has been able to tolerate solids without nausea.  Given imodium for diarrhea, has not had BM, states not passing gas. Still has AB distention.  Patient has had cough, had CXR with effusion, getting lasix and PT to walk  Objective: Vital signs in last 24 hours: Temp:  [99.1 F (37.3 C)] 99.1 F (37.3 C) (09/16 0450) Pulse Rate:  [94-103] 103 (09/16 0450) Resp:  [14-21] 18 (09/16 1300) BP: (128-135)/(61-82) 135/82 (09/16 0450) SpO2:  [93 %-94 %] 94 % (09/16 0450) Weight change:  Last BM Date: 04/16/21  PE: More cheerful than any other day, not in distress, does not appear to be in pain GENERAL: Mild pallor, no icterus  ABDOMEN: Distended abdomen, mild upper AB tenderness, bowel sounds decreased EXTREMITIES: No deformity  Lab Results: Results for orders placed or performed during the hospital encounter of 04/08/21 (from the past 48 hour(s))  C Difficile Quick Screen (NO PCR Reflex)     Status: None   Collection Time: 04/14/21  7:33 PM   Specimen: STOOL  Result Value Ref Range   C Diff antigen NEGATIVE NEGATIVE   C Diff toxin NEGATIVE NEGATIVE   C Diff interpretation No C. difficile detected.     Comment: Performed at [facility], [place] [address] [place], [place] [id number]  Basic metabolic panel     Status: Abnormal   Collection Time: 04/15/21  5:34 AM  Result Value Ref Range   Sodium 139 135 - 145 mmol/L   Potassium 4.0 3.5 - 5.1 mmol/L   Chloride 105 98 - 111 mmol/L   CO2 28 22 - 32 mmol/L   Glucose, Bld 120 (H) 70 - 99 mg/dL    Comment: Glucose reference range applies only to samples taken after fasting for at least 8 hours.   BUN <5 (L) 6 - 20 mg/dL   Creatinine, Ser 1.28 (H) 0.44 - 1.00 mg/dL   Calcium 8.2 (L) 8.9 - 10.3 mg/dL   GFR, Estimated 50 (L) >60 mL/min    Comment:  (NOTE) Calculated using the CKD-EPI Creatinine Equation (2021)    Anion gap 6 5 - 15    Comment: Performed at [facility], [place] [address], [place], [place] [address]  CBC     Status: Abnormal   Collection Time: 04/15/21  5:34 AM  Result Value Ref Range   WBC 6.2 4.0 - 10.5 K/uL   RBC 3.32 (L) 3.87 - 5.11 MIL/uL   Hemoglobin 8.9 (L) 12.0 - 15.0 g/dL   HCT 27.9 (L) 36.0 - 46.0 %   MCV 84.0 80.0 - 100.0 fL   MCH 26.8 26.0 - 34.0 pg   MCHC 31.9 30.0 - 36.0 g/dL   RDW 13.7 11.5 - 15.5 %   Platelets 468 (H) 150 - 400 K/uL   nRBC 0.0 0.0 - 0.2 %    Comment: Performed at [facility], [place] [address] [place], [place] [address]    Studies/Results: DG Chest 2 View  Result Date: 04/16/2021 CLINICAL DATA:  Pt was admitted 04/08/21 with a diagnosis of "Right Upper Quadrant Pain". Patient is now s/p aspiration of renal and hepatic cysts. CC this AM is a persistent, dry, non-productive cough, as well as diarrhea. EXAM: CHEST - 2 VIEW COMPARISON:  04/08/2021 FINDINGS: New/worsening pleural effusions, right greater than left. Patchy  atelectasis or infiltrate in lung bases. Heart size remains normal. No pneumothorax. Visualized bones unremarkable. IMPRESSION: Pleural effusions right-greater-than-left with worsening bibasilar consolidation/atelectasis. Electronically Signed   By: Lucrezia Europe M.D.   On: 04/16/2021 12:35    Medications: I have reviewed the patient's current medications.  Assessment: Polycystic liver and kidney disease, status post drainage of 1 dominant liver cyst and 1 dominant kidney cyst, no growth noted in cyst fluid Diarrhea, stool for C. difficile negative Distended abdomen-possible gaseous distention, with decreased fluctuance Cough with effusions  Plan: Empiric treatment on cefdinir and metronidazole as an outpatient for suspected infection of cysts Will get KUB to evaluate AB with decrease bowel sounds and distension.  Continue simethicone  up to 4 times a day as needed for bloating Eventually will need to be seen as an outpatient at a tertiary center such as [facility] or [facility], where there is availability of liver and kidney transplant Continue ambulation, adequate pain control Getting lasix, continue ambulation Hopefully DC tomorrow   Ronnette Juniper, MD 04/16/2021, 1:49 PM

## 2021-04-16 NOTE — Progress Notes (Signed)
Chaplain provided follow up support to Judy Manning and Judy Manning.  Judy Manning is feeling grateful that she is able to be more independent and that she Judy pain is significantly lower now.  Chaplain provided listening support as she shared Judy updates.  [place], Bcc Pager, [phone] 3:29 PM

## 2021-04-16 NOTE — Progress Notes (Signed)
OT Cancellation Note  Patient Details Name: Judy Manning MRN: [id number] DOB: 02-09-1966   Cancelled Treatment:    Reason Eval/Treat Not Completed: Patient at procedure or test/ unavailable. Patient at X-ray. Patient's mother state if all looks good with X-ray patient going home today. Will check back as schedule permits.  Delbert Phenix OT OT pager: [phone]   Rosemary Holms 04/16/2021, 10:01 AM

## 2021-04-16 NOTE — Progress Notes (Signed)
PROGRESS NOTE  Judy Manning [id number] DOB: 17-Jul-1966 DOA: 04/08/2021 PCP: Eunice Blase, MD   LOS: 8 days   Brief Narrative / Interim history: 54 year old female with history of polycystic kidney disease, hypertension, migraine headaches comes in with a little sudden right flank and right-sided abdominal pain that started about couple of weeks ago and is progressively gotten worse.  She underwent an MRI of the abdomen which showed mildly dilated CBD of 8 mm.  Due to severity of the pain she was placed on Dilaudid PCA.  General surgery as well as IR consulted.  She underwent an aspiration of one of the liver cysts on 9/10 as well as aspiration of the renal cyst on 9/13.  Subjective / 24h Interval events: Still has some shortness of breath, has low-grade temps of 99 last night  Assessment & Plan: Principal Problem Right upper quadrant pain, right flank pain / SIRS on admission-MRI of the abdomen showed innumerable hepatic lesions, combination of simple cysts and biliary hamartomas, with some of the larger cyst demonstrating varying degrees of complexity.  One of the loculations of the cyst also contain some findings concerning for proteinaceous/hemorrhagic debris.  CBD was dilated to 11 mm at the porta hepatis -IR consulted, underwent aspiration of 1 liver cyst as well as renal cyst, no clear-cut evidence of infected material -She was febrile to 101.6 on admission, had an elevated WBC raising concern whether some of the small cysts can actually be infected.  She was placed on Zosyn with improvement in her WBC as well as fever curve.  Case was discussed with ID, Dr. Drucilla Schmidt, difficult to fully rule out infection, he recommends Augmentin for a month then repeat CT scan as an outpatient.  She could not tolerate Augmentin and will place on cefdinir as well as metronidazole -Pain remains controlled.  Has persistent shortness of breath, chest x-ray today showed bilateral pleural effusions which are  likely due to significant IV fluids on admission.  Will give Lasix x1  Active Problems Polycystic kidney disease-she has been off Jynarque/ Tolvapton for couple of weeks since the onset of fevers, follows with Dr. Justin Mend as an outpatient, discussed with him over the phone 9/15  Hypomagnesemia and hypokalemia -continue to monitor and replace   Elevated troponin  -Suspect from demand ischemia from SIRS. Repeat EKG shows sinus tachycardia without any ischemic changes. She denies any chest pain. Echocardiogram unremarkable.    Stage 2 CKD -creatinine at baseline   Normocytic anemia- ? Blood loss in to the cytic lesions, baseline hemoglobin between 11 to 12.  Dropped in the 9 range.  Monitor, overall stable   Thrombocytosis- reactive.   Scheduled Meds:  furosemide       cefdinir  300 mg Oral Q12H   enoxaparin (LOVENOX) injection  40 mg Subcutaneous Q24H   feeding supplement  237 mL Oral BID BM   furosemide  40 mg Intravenous Once   gabapentin  300 mg Oral BID   metroNIDAZOLE  500 mg Oral Q12H   pantoprazole  40 mg Oral Q supper   Continuous Infusions:  promethazine (PHENERGAN) injection (IM or IVPB) 12.5 mg (04/12/21 1034)   PRN Meds:.acetaminophen **OR** acetaminophen, guaiFENesin-dextromethorphan, HYDROmorphone (DILAUDID) injection, loperamide, LORazepam, oxyCODONE, promethazine (PHENERGAN) injection (IM or IVPB), simethicone, traMADol, zolpidem  Diet Orders (From admission, onward)     Start     Ordered   04/13/21 1534  Diet regular Room service appropriate? Yes; Fluid consistency: Thin  Diet effective now  Question Answer Comment  Room service appropriate? Yes   Fluid consistency: Thin      04/13/21 1533            DVT prophylaxis: enoxaparin (LOVENOX) injection 40 mg Start: 04/09/21 1000     Code Status: Full Code  Family Communication: No family at bedside  Status is: Inpatient  Remains inpatient appropriate because:IV treatments appropriate due to intensity  of illness or inability to take PO and Inpatient level of care appropriate due to severity of illness  Dispo: The patient is from: Home              Anticipated d/c is to: Home              Patient currently is not medically stable to d/c.   Difficult to place patient No  Level of care: Telemetry  Consultants:  IR General surgery   Procedures:  IR aspiration liver and kidney cysts  Microbiology  none  Antimicrobials: Zosyn 9/9 >>    Objective: Vitals:   04/15/21 2049 04/16/21 0450 04/16/21 0800 04/16/21 0900  BP: 128/61 135/82    Pulse: 94 (!) 103    Resp: 18 18 (!) 21 14  Temp: 99.1 F (37.3 C) 99.1 F (37.3 C)    TempSrc: Oral Oral    SpO2: 93% 94%    Weight:      Height:       No intake or output data in the 24 hours ending 04/16/21 1310  Filed Weights   04/08/21 1120  Weight: 53.5 kg    Examination:  Constitutional: She is in no distress, sitting in the chair Eyes: No scleral icterus ENMT: mmm Neck: normal, supple Respiratory: Diminished at the bases but overall clear without wheezing Cardiovascular: Regular rate and rhythm, no murmurs Abdomen: No distention, bowel sounds positive Musculoskeletal: no clubbing / cyanosis.  Skin: No rashes seen Neurologic: No focal deficits   Data Reviewed: I have independently reviewed following labs and imaging studies  CBC: Recent Labs  Lab 04/10/21 0925 04/11/21 0609 04/12/21 1032 04/13/21 0441 04/15/21 0534  WBC 12.6* 8.8 10.1 7.2 6.2  NEUTROABS 11.2*  --  8.7*  --   --   HGB 9.5* 9.0* 9.3* 9.0* 8.9*  HCT 28.7* 27.4* 28.2* 27.7* 27.9*  MCV 82.2 82.8 82.7 82.7 84.0  PLT 415* 403* 488* 514* 468*    Basic Metabolic Panel: Recent Labs  Lab 04/10/21 0925 04/10/21 1510 04/11/21 0609 04/12/21 1032 04/13/21 0441 04/15/21 0534  NA 136  --  134* 132* 138 139  K 3.8  --  3.7 4.1 4.4 4.0  CL 100  --  98 92* 98 105  CO2 28  --  '27 28 31 28  '$ GLUCOSE 173*  --  100* 98 87 120*  BUN 11  --  9 7 5* <5*   CREATININE 1.26*  --  1.15* 1.08* 1.06* 1.28*  CALCIUM 8.1*  --  8.1* 8.1* 8.5* 8.2*  MG  --  1.5*  --  1.6* 2.2  --     Liver Function Tests: Recent Labs  Lab 04/10/21 0925 04/11/21 0609 04/12/21 1032  AST 73* 52* 40  ALT 56* 49* 47*  ALKPHOS 98 80 94  BILITOT 0.6 0.5 0.5  PROT 6.0* 5.6* 6.1*  ALBUMIN 2.2* 2.0* 2.2*    Coagulation Profile: Recent Labs  Lab 04/13/21 0441  INR 1.1    HbA1C: No results for input(s): HGBA1C in the last 72 hours. CBG: No results  for input(s): GLUCAP in the last 168 hours.  Recent Results (from the past 240 hour(s))  Blood culture (routine x 2)     Status: None   Collection Time: 04/08/21 12:00 PM   Specimen: BLOOD RIGHT FOREARM  Result Value Ref Range Status   Specimen Description   Final    BLOOD RIGHT FOREARM Performed at [facility], [address] [place], [place] [address]    Special Requests   Final    BOTTLES DRAWN AEROBIC AND ANAEROBIC Blood Culture adequate volume   Culture   Final    NO GROWTH 5 DAYS Performed at [facility], [place] [address], [place], [place] [address]    Report Status 04/13/2021 FINAL  Final  Blood culture (routine x 2)     Status: None   Collection Time: 04/08/21 12:09 PM   Specimen: BLOOD  Result Value Ref Range Status   Specimen Description BLOOD LEFT ANTECUBITAL  Final   Special Requests   Final    BOTTLES DRAWN AEROBIC AND ANAEROBIC Blood Culture adequate volume   Culture   Final    NO GROWTH 5 DAYS Performed at [facility], [place] [address] [place], [place] [address]    Report Status 04/13/2021 FINAL  Final  Resp Panel by RT-PCR (Flu A&B, Covid) Nasopharyngeal Swab     Status: None   Collection Time: 04/08/21  5:38 PM   Specimen: Nasopharyngeal Swab; Nasopharyngeal(NP) swabs in vial transport medium  Result Value Ref Range Status   SARS Coronavirus 2 by RT PCR NEGATIVE NEGATIVE Final    Comment: (NOTE) SARS-CoV-2 target nucleic acids are NOT  DETECTED.  The SARS-CoV-2 RNA is generally detectable in upper respiratory specimens during the acute phase of infection. The lowest concentration of SARS-CoV-2 viral copies this assay can detect is 138 copies/mL. A negative result does not preclude SARS-Cov-2 infection and should not be used as the sole basis for treatment or other patient management decisions. A negative result may occur with  improper specimen collection/handling, submission of specimen other than nasopharyngeal swab, presence of viral mutation(s) within the areas targeted by this assay, and inadequate number of viral copies(<138 copies/mL). A negative result must be combined with clinical observations, patient history, and epidemiological information. The expected result is Negative.  Fact Sheet for Patients:  EntrepreneurPulse.com.au  Fact Sheet for Healthcare Providers:  IncredibleEmployment.be  This test is no t yet approved or cleared by the [place] FDA and  has been authorized for detection and/or diagnosis of SARS-CoV-2 by FDA under an Emergency Use Authorization (EUA). This EUA will remain  in effect (meaning this test can be used) for the duration of the COVID-19 declaration under Section 564(b)(1) of the Act, 21 U.S.C.section 360bbb-3(b)(1), unless the authorization is terminated  or revoked sooner.       Influenza A by PCR NEGATIVE NEGATIVE Final   Influenza B by PCR NEGATIVE NEGATIVE Final    Comment: (NOTE) The Xpert Xpress SARS-CoV-2/FLU/RSV plus assay is intended as an aid in the diagnosis of influenza from Nasopharyngeal swab specimens and should not be used as a sole basis for treatment. Nasal washings and aspirates are unacceptable for Xpert Xpress SARS-CoV-2/FLU/RSV testing.  Fact Sheet for Patients: EntrepreneurPulse.com.au  Fact Sheet for Healthcare Providers: IncredibleEmployment.be  This test is not yet  approved or cleared by the [place] FDA and has been authorized for detection and/or diagnosis of SARS-CoV-2 by FDA under an Emergency Use Authorization (EUA). This EUA will remain in  effect (meaning this test can be used) for the duration of the COVID-19 declaration under Section 564(b)(1) of the Act, 21 U.S.C. section 360bbb-3(b)(1), unless the authorization is terminated or revoked.  Performed at [facility], [address] [place], [place] [address]   MRSA Next Gen by PCR, Nasal     Status: None   Collection Time: 04/09/21 12:27 PM   Specimen: Nasal Mucosa; Nasal Swab  Result Value Ref Range Status   MRSA by PCR Next Gen NOT DETECTED NOT DETECTED Final    Comment: (NOTE) The GeneXpert MRSA Assay (FDA approved for NASAL specimens only), is one component of a comprehensive MRSA colonization surveillance program. It is not intended to diagnose MRSA infection nor to guide or monitor treatment for MRSA infections. Test performance is not FDA approved in patients less than 22 years old. Performed at [facility], [place] [address], [place], [place] [address]   Aerobic/Anaerobic Culture w Gram Stain (surgical/deep wound)     Status: None (Preliminary result)   Collection Time: 04/10/21  2:18 PM   Specimen: Liver; Abscess  Result Value Ref Range Status   Specimen Description   Final    LIVER CYSTS FLUID Performed at [place] [address], [place], [place] [address]    Special Requests   Final    NONE Performed at [facility], [place] [address] [place], [place] [address]    Gram Stain   Final    FEW SQUAMOUS EPITHELIAL CELLS PRESENT FEW WBC SEEN NO ORGANISMS SEEN    Culture   Final    CULTURE REINCUBATED FOR BETTER GROWTH Performed at [facility], [address] [place], [place] [address]    Report Status PENDING  Incomplete  Urine Culture     Status: None   Collection Time:  04/14/21  4:00 AM   Specimen: Urine, Clean Catch  Result Value Ref Range Status   Specimen Description   Final    URINE, CLEAN CATCH Performed at [facility], [place] [address] [place], [place] [address]    Special Requests   Final    Normal Performed at [facility], [place] [address], [place], [place] [address]    Culture   Final    NO GROWTH Performed at [facility], [place] [address], [place], [place] [address]    Report Status 04/15/2021 FINAL  Final  C Difficile Quick Screen (NO PCR Reflex)     Status: None   Collection Time: 04/14/21  7:33 PM   Specimen: STOOL  Result Value Ref Range Status   C Diff antigen NEGATIVE NEGATIVE Final   C Diff toxin NEGATIVE NEGATIVE Final   C Diff interpretation No C. difficile detected.  Final    Comment: Performed at [facility], [place] [address] [place], [place] [address]      Radiology Studies: DG Chest 2 View  Result Date: 04/16/2021 CLINICAL DATA:  Pt was admitted 04/08/21 with a diagnosis of "Right Upper Quadrant Pain". Patient is now s/p aspiration of renal and hepatic cysts. CC this AM is a persistent, dry, non-productive cough, as well as diarrhea. EXAM: CHEST - 2 VIEW COMPARISON:  04/08/2021 FINDINGS: New/worsening pleural effusions, right greater than left. Patchy atelectasis or infiltrate in lung bases. Heart size remains normal. No pneumothorax. Visualized bones unremarkable. IMPRESSION: Pleural effusions right-greater-than-left with worsening bibasilar consolidation/atelectasis. Electronically Signed   By: Lucrezia Europe M.D.   On: 04/16/2021  12:35    Marzetta Board, MD, PhD [facility]  Between 7 am - 7 pm I am available, please contact me via Amion (for emergencies) or Securechat (non urgent messages)  Between 7 pm - 7 am I am not available, please contact night coverage MD/APP via Amion

## 2021-04-17 DIAGNOSIS — R140 Abdominal distension (gaseous): Secondary | ICD-10-CM

## 2021-04-17 LAB — AEROBIC/ANAEROBIC CULTURE W GRAM STAIN (SURGICAL/DEEP WOUND)

## 2021-04-17 MED ORDER — CEFDINIR 300 MG PO CAPS: 300.0000 mg | ORAL_CAPSULE | Freq: Two times a day (BID) | ORAL | 0 refills | Status: DC

## 2021-04-17 MED ORDER — OXYCODONE HCL 5 MG PO TABS: 5.0000 mg | ORAL_TABLET | ORAL | 0 refills | Status: DC | PRN

## 2021-04-17 MED ORDER — GUAIFENESIN-DM 100-10 MG/5ML PO SYRP: 5.0000 mL | ORAL_SOLUTION | ORAL | 0 refills | Status: DC | PRN

## 2021-04-17 MED ORDER — METRONIDAZOLE 500 MG PO TABS: 500.0000 mg | ORAL_TABLET | Freq: Two times a day (BID) | ORAL | 0 refills | Status: DC

## 2021-04-17 NOTE — Discharge Summary (Signed)
Physician Discharge Summary  Judy Manning [id number] DOB: 1966/01/29 DOA: 04/08/2021  PCP: Eunice Blase, MD  Admit date: 04/08/2021 Discharge date: 04/17/2021  Admitted From: home Disposition:  home  Recommendations for Outpatient Follow-up:  Follow up with Dr Justin Mend in 1 week Please obtain BMP/CBC in one week Follow up with Dr Tommy Medal in 2-3 weeks  Home Health: none Equipment/Devices: none  Discharge Condition: stable CODE STATUS: Full code Diet recommendation: regular  HPI: Per admitting MD, 55 year old female with past medical history of polycystic kidney disease, hypertension, migraine headaches who presents to med center [facility] emergency department with complaints of right chest and flank pain. Patient began to experience fevers fatigue and right chest/flank pain approximately 2 weeks ago.  Symptoms were initially mild intensity but rapidly became severe.  Patient describes her pain as originating in the right upper quadrant, radiating into the right chest and into the right shoulder blade.  Pain can reach 10 out of 10 in intensity, a sharp in quality and seems to be improved when the patient lies on her left side.  Pain additionally gets worse when patient takes deep inspirations. Alongside this severe pain patient has been experiencing intermittent fevers generalized weakness and poor appetite.  Patient additionally complains of nausea with occasional bouts of vomiting and loose stools. Upon further questioning, patient denies sick contacts, recent travel or contact with confirmed COVID-19 infection. Patient initially presented to urgent care clinic on 8/30 for evaluation.  After an evaluation there patient was felt to be suffering from a viral syndrome and was sent home for supportive care.  Patient continued to experience symptoms for several days and eventually went to a [facility] health emergency department on 9/5 for evaluation.  During that visit patient underwent CT imaging  of the abdomen and pelvis which was essentially unremarkable with exception of chronic findings of polycystic kidney disease.  Due to patient's negative work-up she was eventually discharged home.  Concurrently, the patient had seen her primary care provider who provided her a course of azithromycin which she completed with no improvement in symptoms. Due to progressively worsening right flank, right upper quadrant, right chest pain, shortness of breath, fevers, weakness, nausea, vomiting patient eventually presented to med center [facility] emergency department for evaluation. Upon valuation in the emergency department a right upper quadrant ultrasound did reveal a mildly dilated common bile duct of 8 mm.  Patient was noted to have a leukocytosis of 13.6 with multiple SIRS criteria including fever of 100.5 F.  Emergency department provider initially discussed this with the gastroenterology.  Considering normal LFTs gastroenterology recommended medicine to admit and continue to work patient up and consult gastroenterology on day shift if necessary.  1 L of isotonic fluids was provided.  Chest x-ray performed revealed a small right-sided pleural effusion.  Hospital Course / Discharge diagnoses: Principal Problem Right upper quadrant pain, right flank pain / SIRS on admission-MRI of the abdomen showed innumerable hepatic lesions, combination of simple cysts and biliary hamartomas, with some of the larger cyst demonstrating varying degrees of complexity.  One of the loculations of the cyst also contain some findings concerning for proteinaceous/hemorrhagic debris.  CBD was dilated to 11 mm at the porta hepatis. IR consulted, underwent aspiration of 1 liver cyst as well as renal cyst, no clear-cut evidence of infected material.  However, she was febrile to 101.6 on admission, had an elevated WBC raising concern whether some of the small cysts can actually be infected.  She was placed on Zosyn  with improvement in  her WBC as well as fever curve.  Case was discussed with ID, Dr. Drucilla Schmidt, difficult to fully rule out infection, he recommends antibiotics, preferably Augmentin, for a month then repeat CT scan as an outpatient.  She could not tolerate Augmentin and will place on cefdinir as well as metronidazole after discussing with ID again.  After drainage of the cyst as well as antibiotic treatment her pain has resolved, she has not needed any pain medication in the last 48 hours, has been afebrile, breathing is improved and she is able to ambulate in the hallway on room air and will be discharged home in stable condition and was advised to follow-up as an outpatient with nephrology as well as infectious disease.   Active Problems Polycystic kidney disease-she has been off Jynarque/ Tolvapton for couple of weeks since the onset of fevers, follows with Dr. Justin Mend as an outpatient, discussed with him over the phone 9/15, continue to hold on discharge  Hypomagnesemia and hypokalemia -replaced Elevated troponin  -Suspect from demand ischemia from SIRS. Repeat EKG shows sinus tachycardia without any ischemic changes. She denies any chest pain. Echocardiogram unremarkable.  Stage 2 CKD -creatinine at baseline Normocytic anemia- ? Blood loss in to the cytic lesions, baseline hemoglobin between 11 to 12.  Dropped in the 9 range.  Monitor, overall stable  Thrombocytosis- reactive.  Sepsis ruled out   Discharge Instructions   Allergies as of 04/17/2021       Reactions   Imitrex [sumatriptan] Swelling   Throat gets tight   Ibuprofen Other (See Comments)   Polycystic kidney disease   Nitrofurantoin Rash   Oseltamivir Nausea And Vomiting        Medication List     STOP taking these medications    chlorthalidone 25 MG tablet Commonly known as: HYGROTON   Jynarque 90 & 30 MG Tbpk Generic drug: Tolvaptan       TAKE these medications    Biotin 10000 MCG Tabs Take by mouth.   cefdinir 300 MG  capsule Commonly known as: OMNICEF Take 1 capsule (300 mg total) by mouth every 12 (twelve) hours for 22 days.   cetirizine 10 MG tablet Commonly known as: ZYRTEC Take 10 mg by mouth daily as needed for allergies (alternates with claritin).   Dapsone 5 % topical gel Apply 1 application topically 2 (two) times daily.   gabapentin 300 MG capsule Commonly known as: NEURONTIN TAKE 1 CAPSULE BY MOUTH  TWICE DAILY   guaiFENesin-dextromethorphan 100-10 MG/5ML syrup Commonly known as: ROBITUSSIN DM Take 5 mLs by mouth every 4 (four) hours as needed for cough.   metroNIDAZOLE 500 MG tablet Commonly known as: FLAGYL Take 1 tablet (500 mg total) by mouth every 12 (twelve) hours for 22 days.   multivitamin tablet Take 1 tablet by mouth daily.   ondansetron 4 MG disintegrating tablet Commonly known as: Zofran ODT Take 1 tablet (4 mg total) by mouth every 8 (eight) hours as needed for nausea or vomiting.   oxyCODONE 5 MG immediate release tablet Commonly known as: Oxy IR/ROXICODONE Take 1-2 tablets (5-10 mg total) by mouth every 4 (four) hours as needed for breakthrough pain (not relieved with tramadol).   tretinoin 0.025 % cream Commonly known as: RETIN-A Apply topically at bedtime.   Vitamin D-3 125 MCG (5000 UT) Tabs Take by mouth.   zolpidem 10 MG tablet Commonly known as: AMBIEN TAKE 1/2 TO 1 TABLET BY  MOUTH AT BEDTIME AS NEEDED  FOR SLEEP  Follow-up Information     Tommy Medal, Lavell Islam, MD Follow up in 2 week(s).   Specialty: Infectious Diseases Contact information: [address] [address] [phone]         Edrick Oh, MD Follow up in 1 week(s).   Specialty: Nephrology Contact information: [address] [place] [address] [phone]                 Consultations: Nephrology  IR  Procedures/Studies:  DG Chest 2 View  Result Date: 04/16/2021 CLINICAL DATA:  Pt was admitted 04/08/21 with a diagnosis of "Right  Upper Quadrant Pain". Patient is now s/p aspiration of renal and hepatic cysts. CC this AM is a persistent, dry, non-productive cough, as well as diarrhea. EXAM: CHEST - 2 VIEW COMPARISON:  04/08/2021 FINDINGS: New/worsening pleural effusions, right greater than left. Patchy atelectasis or infiltrate in lung bases. Heart size remains normal. No pneumothorax. Visualized bones unremarkable. IMPRESSION: Pleural effusions right-greater-than-left with worsening bibasilar consolidation/atelectasis. Electronically Signed   By: Lucrezia Europe M.D.   On: 04/16/2021 12:35   DG Abd 1 View  Result Date: 04/16/2021 CLINICAL DATA:  Abdominal bloating EXAM: ABDOMEN - 1 VIEW COMPARISON:  None. FINDINGS: Nonobstructed gas pattern without significant stool burden. No radiopaque calculi. Scoliosis. IMPRESSION: Nonobstructed gas pattern without significant stool burden Electronically Signed   By: Donavan Foil M.D.   On: 04/16/2021 16:11   CT Angio Chest Pulmonary Embolism (PE) W or WO Contrast  Result Date: 04/09/2021 CLINICAL DATA:  PE suspected, high prob. Right rib and back pain. Fever. EXAM: CT ANGIOGRAPHY CHEST WITH CONTRAST TECHNIQUE: Multidetector CT imaging of the chest was performed using the standard protocol during bolus administration of intravenous contrast. Multiplanar CT image reconstructions and MIPs were obtained to evaluate the vascular anatomy. CONTRAST:  39m OMNIPAQUE IOHEXOL 350 MG/ML SOLN COMPARISON:  None. FINDINGS: Cardiovascular: No filling defects in the pulmonary arteries to suggest pulmonary emboli. Heart is normal size. Aorta is normal caliber. Mediastinum/Nodes: No mediastinal, hilar, or axillary adenopathy. Trachea and esophagus are unremarkable. Thyroid unremarkable. Lungs/Pleura: Moderate right pleural effusion and small left pleural effusion. Dependent atelectasis in the lower lobes bilaterally. Upper Abdomen: Numerous cysts in the visualized liver and kidneys compatible with polycystic kidney  disease. Musculoskeletal: Bilateral breast implants. Chest wall soft tissues are unremarkable. No acute bony abnormality. Review of the MIP images confirms the above findings. IMPRESSION: No evidence of pulmonary embolus. Moderate right pleural effusion and small left pleural effusion. Dependent atelectasis. Electronically Signed   By: KRolm BaptiseM.D.   On: 04/09/2021 00:13   UKoreaAbdomen Complete  Result Date: 04/08/2021 CLINICAL DATA:  Right upper quadrant pain for 3 days, history of polycystic kidney disease EXAM: ABDOMEN ULTRASOUND COMPLETE COMPARISON:  CT abdomen pelvis, 04/05/2021 FINDINGS: Gallbladder: No gallstones or wall thickening visualized. No sonographic Murphy sign noted by sonographer. Common bile duct: Diameter: 8 mm. Liver: Numerous liver cysts. Within normal limits in parenchymal echogenicity. Portal vein is patent on color Doppler imaging with normal direction of blood flow towards the liver. IVC: No abnormality visualized. Pancreas: Visualized portion unremarkable. Spleen: Size and appearance within normal limits. Right Kidney: Length: 16.4 cm. The kidney is grossly enlarged by numerous cysts. Echogenicity within normal limits. No mass or hydronephrosis visualized. Left Kidney: Length: 18.1 cm. The kidney is grossly enlarged by numerous cysts. Echogenicity within normal limits. No mass or hydronephrosis visualized. Abdominal aorta: No aneurysm visualized. Other findings: Small right pleural effusion. IMPRESSION: 1. No definite acute abnormality. 2.  Common bile duct measures 8 mm, mildly dilated. Patency may be further evaluated by HIDA or MRCP if indicated by clinical evidence of cholestasis. 3. Polycystic kidney and liver disease. 4. Small right pleural effusion. Electronically Signed   By: Eddie Candle M.D.   On: 04/08/2021 13:57   CT ABDOMEN PELVIS W CONTRAST  Result Date: 04/05/2021 CLINICAL DATA:  Epigastric pain, fever of unknown origin EXAM: CT ABDOMEN AND PELVIS WITH CONTRAST  TECHNIQUE: Multidetector CT imaging of the abdomen and pelvis was performed using the standard protocol following bolus administration of intravenous contrast. CONTRAST:  62m OMNIPAQUE IOHEXOL 350 MG/ML SOLN COMPARISON:  None. FINDINGS: Lower chest: Small right pleural effusion. Right base atelectasis. Heart is normal size. Hepatobiliary: Innumerable hepatic cysts. Gallbladder unremarkable. Common bile duct mildly dilated at 9 mm. Pancreas: No focal abnormality or ductal dilatation. Spleen: No focal abnormality.  Normal size. Adrenals/Urinary Tract: Innumerable bilateral renal cysts. No hydronephrosis. Adrenal glands unremarkable as is urinary bladder. Stomach/Bowel: Normal appendix. Stomach, large and small bowel grossly unremarkable. Vascular/Lymphatic: No evidence of aneurysm or adenopathy. Reproductive: Uterus and adnexa unremarkable.  No mass. Other: Small amount of free fluid in the pelvis.  No free air. Musculoskeletal: No acute bony abnormality. IMPRESSION: Innumerable hepatic and bilateral renal cysts most compatible with polycystic kidney disease. Small right pleural effusion. Small amount of free fluid in the pelvis, likely physiologic free fluid. No acute findings in the abdomen or pelvis. Electronically Signed   By: KRolm BaptiseM.D.   On: 04/05/2021 20:32   MR 3D Recon At Scanner  Result Date: 04/09/2021 CLINICAL DATA:  55year old female with history of right upper quadrant abdominal pain. Possible common bile duct dilatation noted on recent abdominal ultrasound. Follow-up study. EXAM: MRI ABDOMEN WITHOUT AND WITH CONTRAST (INCLUDING MRCP) TECHNIQUE: Multiplanar multisequence MR imaging of the abdomen was performed both before and after the administration of intravenous contrast. Heavily T2-weighted images of the biliary and pancreatic ducts were obtained, and three-dimensional MRCP images were rendered by post processing. CONTRAST:  538mGADAVIST GADOBUTROL 1 MMOL/ML IV SOLN COMPARISON:  No prior  abdominal MRI. Abdominal ultrasound 04/08/2021. CT the abdomen and pelvis 04/05/2021. FINDINGS: Lower chest: Small bilateral pleural effusions (right greater than left) lying dependently. Bilateral breast implants are incidentally noted. 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 Some of the larger cyst demonstrate varying degrees of complexity, including the largest lesion involving portions of segments 7 and 8 (axial image 9 of series 15 and coronal image 20 of series 5) which measures 8.4 x 7.6 x 7.3 cm and has several internal septations measuring up to 4 mm in thickness, which demonstrates some low-level areas of enhancement. One of the loculations of the cyst also contains some dependent T1 hyperintense material (axial image 15 of series 17), most compatible with a proteinaceous/hemorrhagic debris. No other definite enhancing hepatic lesions are confidently identified. Gallbladder is normal in appearance. No intrahepatic biliary ductal dilatation noted on MRCP images. However, common bile duct is dilated up to 11 mm in the porta hepatis. There are no filling defects within the common bile duct to suggest choledocholithiasis. Ductal dilatation terminates abruptly at the level of the ampulla. Pancreas: No pancreatic mass. No pancreatic ductal dilatation. No pancreatic or peripancreatic fluid collections or inflammatory changes. Spleen:  Unremarkable. Adrenals/Urinary Tract: Kidneys are essentially completely replaced with innumerable cystic lesions of varying degrees of complexity. The  majority of these are T1 hypointense, T2 hyperintense and do not enhance, compatible with simple cysts. Many of these lesions demonstrate some thin internal in septations (Bosniak class 2). Some small lesions demonstrate T1 hyperintensity and mild T2  hypointensity, without definite internal enhancement, compatible with proteinaceous/hemorrhagic cysts. No definite suspicious renal lesions are confidently identified on today's examination. No hydroureteronephrosis in the visualized portions of the abdomen. Bilateral adrenal glands are normal in appearance. Stomach/Bowel: Visualized portions are unremarkable. Vascular/Lymphatic: No aneurysm identified in the visualized abdominal vasculature. No lymphadenopathy noted in the abdomen. Other: No significant volume of ascites noted in the visualized portions of the peritoneal cavity. Musculoskeletal: No aggressive appearing osseous lesions are noted in the visualized portions of the skeleton. IMPRESSION: 1. 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 2. Dilatation of the common bile duct which measures up to 10 mm in diameter. However, there is no choledocholithiasis, evidence of obstructing mass, pancreatic ductal dilatation, or intrahepatic biliary ductal dilatation to clearly indicate obstruction. This is favored to reflect benign dilatation of the common bile duct, likely related to autosomal dominant polycystic kidney disease, as there is a known association. 3. Small bilateral pleural effusions (right greater than left). 4. Additional incidental findings, as above. Electronically Signed   By: Vinnie Langton M.D.   On: 04/09/2021 06:31   DG Chest Portable 1 View  Result Date: 04/08/2021 CLINICAL DATA:  Shortness of breath and fever. EXAM: PORTABLE CHEST 1 VIEW COMPARISON:  04/05/2021 FINDINGS: The cardiac silhouette, mediastinal and hilar contours are within normal limits. A small left effusion is now noted. No infiltrates or edema. No pneumothorax. The bony thorax is intact. IMPRESSION: Small left pleural effusion. Electronically Signed   By: Marijo Sanes M.D.   On: 04/08/2021 12:44   DG  Chest Portable 1 View  Result Date: 04/05/2021 CLINICAL DATA:  Fever of unknown origin EXAM: PORTABLE CHEST 1 VIEW COMPARISON:  None. FINDINGS: The heart size and mediastinal contours are within normal limits. Both lungs are clear. The visualized skeletal structures are unremarkable. IMPRESSION: No active disease. Electronically Signed   By: Donavan Foil M.D.   On: 04/05/2021 19:01   MR ABDOMEN MRCP W WO CONTAST  Result Date: 04/09/2021 CLINICAL DATA:  55 year old female with history of right upper quadrant abdominal pain. Possible common bile duct dilatation noted on recent abdominal ultrasound. Follow-up study. EXAM: MRI ABDOMEN WITHOUT AND WITH CONTRAST (INCLUDING MRCP) TECHNIQUE: Multiplanar multisequence MR imaging of the abdomen was performed both before and after the administration of intravenous contrast. Heavily T2-weighted images of the biliary and pancreatic ducts were obtained, and three-dimensional MRCP images were rendered by post processing. CONTRAST:  82m GADAVIST GADOBUTROL 1 MMOL/ML IV SOLN COMPARISON:  No prior abdominal MRI. Abdominal ultrasound 04/08/2021. CT the abdomen and pelvis 04/05/2021. FINDINGS: Lower chest: Small bilateral pleural effusions (right greater than left) lying dependently. Bilateral breast implants are incidentally noted. 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 Some of the larger cyst demonstrate varying degrees of complexity, including the largest lesion involving portions of segments 7 and 8 (axial image 9 of series 15 and coronal image 20 of series 5) which measures 8.4 x 7.6 x 7.3 cm and has several internal septations measuring up to 4 mm in thickness, which demonstrates some low-level areas of enhancement. One  of the loculations of the cyst also contains some dependent T1 hyperintense  material (axial image 15 of series 17), most compatible with a proteinaceous/hemorrhagic debris. No other definite enhancing hepatic lesions are confidently identified. Gallbladder is normal in appearance. No intrahepatic biliary ductal dilatation noted on MRCP images. However, common bile duct is dilated up to 11 mm in the porta hepatis. There are no filling defects within the common bile duct to suggest choledocholithiasis. Ductal dilatation terminates abruptly at the level of the ampulla. Pancreas: No pancreatic mass. No pancreatic ductal dilatation. No pancreatic or peripancreatic fluid collections or inflammatory changes. Spleen:  Unremarkable. Adrenals/Urinary Tract: Kidneys are essentially completely replaced with innumerable cystic lesions of varying degrees of complexity. The majority of these are T1 hypointense, T2 hyperintense and do not enhance, compatible with simple cysts. Many of these lesions demonstrate some thin internal in septations (Bosniak class 2). Some small lesions demonstrate T1 hyperintensity and mild T2 hypointensity, without definite internal enhancement, compatible with proteinaceous/hemorrhagic cysts. No definite suspicious renal lesions are confidently identified on today's examination. No hydroureteronephrosis in the visualized portions of the abdomen. Bilateral adrenal glands are normal in appearance. Stomach/Bowel: Visualized portions are unremarkable. Vascular/Lymphatic: No aneurysm identified in the visualized abdominal vasculature. No lymphadenopathy noted in the abdomen. Other: No significant volume of ascites noted in the visualized portions of the peritoneal cavity. Musculoskeletal: No aggressive appearing osseous lesions are noted in the visualized portions of the skeleton. IMPRESSION: 1. 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 2. Dilatation of the  common bile duct which measures up to 10 mm in diameter. However, there is no choledocholithiasis, evidence of obstructing mass, pancreatic ductal dilatation, or intrahepatic biliary ductal dilatation to clearly indicate obstruction. This is favored to reflect benign dilatation of the common bile duct, likely related to autosomal dominant polycystic kidney disease, as there is a known association. 3. Small bilateral pleural effusions (right greater than left). 4. Additional incidental findings, as above. Electronically Signed   By: Vinnie Langton M.D.   On: 04/09/2021 06:31   ECHOCARDIOGRAM COMPLETE  Result Date: 04/11/2021    ECHOCARDIOGRAM REPORT   Patient Name:   Judy Manning Date of Exam: 04/11/2021 Medical Rec #:  [id number]        Height:       60.0 in Accession #:    [id number]       Weight:       118.0 lb Date of Birth:  10/11/1965        BSA:          1.492 m Patient Age:    55 years         BP:           123/67 mmHg Patient Gender: F                HR:           106 bpm. Exam Location:  Inpatient Procedure: 2D Echo, Cardiac Doppler and Color Doppler Indications:    Elevated Troponin  History:        Patient has no prior history of Echocardiogram examinations.                 Polycystic kidney disease, hypertension, chronic kidney disease.                 Fevers fatigue and right chest/flank pain.  Sonographer:    Darlina Sicilian  RDCS Referring Phys: [id number] VIJAYA AKULA  Sonographer Comments: Augemntation Mammaplasty. IMPRESSIONS  1. Left ventricular ejection fraction, by estimation, is 60 to 65%. The left ventricle has normal function. The left ventricle has no regional wall motion abnormalities. Left ventricular diastolic parameters were normal.  2. Right ventricular systolic function is normal. The right ventricular size is normal.  3. The mitral valve is normal in structure. No evidence of mitral valve regurgitation. No evidence of mitral stenosis.  4. The aortic valve is tricuspid. Aortic valve  regurgitation is not visualized. No aortic stenosis is present.  5. The inferior vena cava is normal in size with greater than 50% respiratory variability, suggesting right atrial pressure of 3 mmHg. FINDINGS  Left Ventricle: Left ventricular ejection fraction, by estimation, is 60 to 65%. The left ventricle has normal function. The left ventricle has no regional wall motion abnormalities. The left ventricular internal cavity size was normal in size. There is  no left ventricular hypertrophy. Left ventricular diastolic parameters were normal. Right Ventricle: The right ventricular size is normal. Right ventricular systolic function is normal. Left Atrium: Left atrial size was normal in size. Right Atrium: Right atrial size was normal in size. Pericardium: Trivial pericardial effusion is present. Mitral Valve: The mitral valve is normal in structure. No evidence of mitral valve regurgitation. No evidence of mitral valve stenosis. Tricuspid Valve: The tricuspid valve is normal in structure. Tricuspid valve regurgitation is trivial. No evidence of tricuspid stenosis. Aortic Valve: The aortic valve is tricuspid. Aortic valve regurgitation is not visualized. No aortic stenosis is present. Pulmonic Valve: The pulmonic valve was normal in structure. Pulmonic valve regurgitation is not visualized. No evidence of pulmonic stenosis. Aorta: The aortic root is normal in size and structure. Venous: The inferior vena cava is normal in size with greater than 50% respiratory variability, suggesting right atrial pressure of 3 mmHg. IAS/Shunts: No atrial level shunt detected by color flow Doppler.  LEFT VENTRICLE PLAX 2D LVIDd:         3.80 cm  Diastology LVIDs:         2.60 cm  LV e' medial:    7.15 cm/s LV PW:         0.80 cm  LV E/e' medial:  6.9 LV IVS:        0.90 cm  LV e' lateral:   8.94 cm/s LVOT diam:     1.70 cm  LV E/e' lateral: 5.5 LV SV:         29 LV SV Index:   19 LVOT Area:     2.27 cm  RIGHT VENTRICLE RV S prime:      15.20 cm/s TAPSE (M-mode): 1.9 cm LEFT ATRIUM           Index LA diam:      3.00 cm 2.01 cm/m LA Vol (A4C): 25.6 ml 17.16 ml/m  AORTIC VALVE LVOT Vmax:   75.30 cm/s LVOT Vmean:  47.300 cm/s LVOT VTI:    0.126 m  AORTA Ao Root diam: 2.60 cm Ao Asc diam:  2.70 cm MITRAL VALVE MV Area (PHT): 4.42 cm    SHUNTS MV Decel Time: 172 msec    Systemic VTI:  0.13 m MV E velocity: 49.30 cm/s  Systemic Diam: 1.70 cm MV A velocity: 51.00 cm/s MV E/A ratio:  0.97 Kirk Ruths MD Electronically signed by Kirk Ruths MD Signature Date/Time: 04/11/2021/1:54:05 PM    Final    [place] FINE NEEDLE ASP 1ST LESION  Result Date: 04/13/2021  INDICATION: ADPKD, with symptomatic RIGHT renal cyst. EXAM: ULTRASOUND GUIDED RIGHT RENAL CYST ASPIRATION COMPARISON:  CT pelvis, 04/05/2021. MR abdomen, 04/09/2021. Renal ultrasound, 10/15/2020 MEDICATIONS: Versed 2 mg IV; Fentanyl 100 mcg IV ANESTHESIA/SEDATION: Total Moderate Sedation Time 13 minutes COMPLICATIONS: None immediate TECHNIQUE: Informed written consent was obtained from the patient after a discussion of the risks, benefits and alternatives to treatment. Questions regarding the procedure were encouraged and answered. A timeout was performed prior to the initiation of the procedure. The patient was positioned supine, with her RIGHT side wedged. The RIGHT flank was prepped and draped in the usual sterile fashion, and a sterile drape was applied covering the operative field. Maximum barrier sterile technique with sterile gowns and gloves were used for the procedure. A timeout was performed prior to the initiation of the procedure. Initial ultrasound scanning redemonstrated multi-cystic RIGHT kidney with dominant RIGHT inferior renal pole septated cyst. The dominant RIGHT inferior pole renal cyst was aspirated with a 5-French multi side-hole Yueh sheath needle after the overlying soft tissues was anesthetized with 1% lidocaine with epinephrine. A total of approximately 120 ml of serous  fluid was aspirated. Postprocedural ultrasound imaging demonstrated decompression of the dominant inferior pole cyst with innumerable renal cysts remaining bilaterally. The patient tolerated the procedure well without immediate postprocedural complication. 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 Michaelle Birks, MD [facility] Electronically Signed   By: Michaelle Birks M.D.   On: 04/13/2021 18:08   [place] FINE NEEDLE ASP 1ST LESION  Result Date: 04/10/2021 INDICATION: 55 year old female referred for aspiration of liver cyst EXAM: IMAGE GUIDED ASPIRATION RIGHT LIVER CYST MEDICATIONS: None ANESTHESIA/SEDATION: Fentanyl 100 mcg IV; Versed 2.0 mg IV Moderate Sedation Time:  16 minutes The patient was continuously monitored during the procedure by the interventional radiology nurse under my direct supervision. COMPLICATIONS: None PROCEDURE: Informed written consent was obtained from the patient after a thorough discussion of the procedural risks, benefits and alternatives. All questions were addressed. Maximal Sterile Barrier Technique was utilized including caps, mask, sterile gowns, sterile gloves, sterile drape, hand hygiene and skin antiseptic. A timeout was performed prior to the initiation of the procedure. Ultrasound survey of the right liver lobe performed with images stored and sent to PACs. The right lower thorax/right upper abdomen was prepped with chlorhexidine in a sterile fashion, and a sterile drape was applied covering the operative field. A sterile gown and sterile gloves were used for the procedure. Local anesthesia was provided with 1% Lidocaine. The patient was prepped and draped sterilely and the skin and subcutaneous tissues were generously infiltrated with 1% lidocaine. Small  stab incision was made with 11 blade scalpel and gentle soft tissue spreading was performed with a hemostat. A 10 French drain was then advanced with ultrasound guidance via trocar technique into the targeted large cyst of the right hepatic dome. Drain was advanced into position and the needle was removed. We then aspirated approximally 100 cc of thin yellow fluid with minimal debris. Sample was sent for culture. After aspiration of the fluid, the drain was removed. Final ultrasound image was performed. The patient tolerated the procedure well and remained hemodynamically stable throughout. No complications were encountered and no significant blood loss was encounter. IMPRESSION: Status post ultrasound-guided aspiration of right hepatic dome cyst. Signed, Dulcy Fanny. Dellia Nims, [place] [facility] Electronically  Signed   By: Corrie Mckusick D.O.   On: 04/10/2021 15:04     Subjective: - no chest pain, shortness of breath, no abdominal pain, nausea or vomiting.   Discharge Exam: BP 126/69 (BP Location: Left Arm)   Pulse (!) 101   Temp 99.5 F (37.5 C) (Oral)   Resp 17   Ht 5' (1.524 m)   Wt 53.5 kg   SpO2 91%   BMI 23.05 kg/m   General: Pt is alert, awake, not in acute distress Cardiovascular: RRR, S1/S2 +, no rubs, no gallops Respiratory: CTA bilaterally, no wheezing, no rhonchi Abdominal: Soft, NT, ND, bowel sounds + Extremities: no edema, no cyanosis    The results of significant diagnostics from this hospitalization (including imaging, microbiology, ancillary and laboratory) are listed below for reference.     Microbiology: Recent Results (from the past 240 hour(s))  Blood culture (routine x 2)     Status: None   Collection Time: 04/08/21 12:00 PM   Specimen: BLOOD RIGHT FOREARM  Result Value Ref Range Status   Specimen Description   Final    BLOOD RIGHT FOREARM Performed at [facility], [address] [place], [place] [address]    Special Requests   Final    BOTTLES DRAWN AEROBIC AND ANAEROBIC Blood Culture adequate volume   Culture   Final    NO GROWTH 5 DAYS Performed at [facility], [place] [address], [place], [place] [address]    Report Status 04/13/2021 FINAL  Final  Blood culture (routine x 2)     Status: None   Collection Time: 04/08/21 12:09 PM   Specimen: BLOOD  Result Value Ref Range Status   Specimen Description BLOOD LEFT ANTECUBITAL  Final   Special Requests   Final    BOTTLES DRAWN AEROBIC AND ANAEROBIC Blood Culture adequate volume   Culture   Final    NO GROWTH 5 DAYS Performed at [facility], [place] [address], [place], [place] [address]    Report Status 04/13/2021 FINAL  Final  Resp Panel by RT-PCR (Flu A&B, Covid) Nasopharyngeal Swab     Status: None   Collection Time: 04/08/21  5:38 PM   Specimen: Nasopharyngeal Swab; Nasopharyngeal(NP) swabs in vial transport medium  Result Value Ref Range Status   SARS Coronavirus 2 by RT PCR NEGATIVE NEGATIVE Final    Comment: (NOTE) SARS-CoV-2 target nucleic acids are NOT DETECTED.  The SARS-CoV-2 RNA is generally detectable in upper respiratory specimens during the acute phase of infection. The lowest concentration of SARS-CoV-2 viral copies this assay can detect is 138 copies/mL. A negative result does not preclude SARS-Cov-2 infection and should not be used as the sole basis for treatment or other patient management decisions. A negative result may occur with  improper specimen collection/handling, submission of specimen other than nasopharyngeal swab, presence of viral mutation(s) within the areas targeted by this assay, and inadequate number of viral copies(<138 copies/mL). A negative result must be combined with clinical observations, patient history, and epidemiological information. The expected result is Negative.  Fact Sheet for Patients:  EntrepreneurPulse.com.au  Fact Sheet  for Healthcare Providers:  IncredibleEmployment.be  This test is no t yet approved or cleared by the [place] FDA and  has been authorized for detection and/or diagnosis of SARS-CoV-2 by FDA under an Emergency Use Authorization (EUA). This EUA will remain  in effect (meaning this test can be used) for the duration of the COVID-19 declaration under Section 564(b)(1)  of the Act, 21 U.S.C.section 360bbb-3(b)(1), unless the authorization is terminated  or revoked sooner.       Influenza A by PCR NEGATIVE NEGATIVE Final   Influenza B by PCR NEGATIVE NEGATIVE Final    Comment: (NOTE) The Xpert Xpress SARS-CoV-2/FLU/RSV plus assay is intended as an aid in the diagnosis of influenza from Nasopharyngeal swab specimens and should not be used as a sole basis for treatment. Nasal washings and aspirates are unacceptable for Xpert Xpress SARS-CoV-2/FLU/RSV testing.  Fact Sheet for Patients: EntrepreneurPulse.com.au  Fact Sheet for Healthcare Providers: IncredibleEmployment.be  This test is not yet approved or cleared by the [place] FDA and has been authorized for detection and/or diagnosis of SARS-CoV-2 by FDA under an Emergency Use Authorization (EUA). This EUA will remain in effect (meaning this test can be used) for the duration of the COVID-19 declaration under Section 564(b)(1) of the Act, 21 U.S.C. section 360bbb-3(b)(1), unless the authorization is terminated or revoked.  Performed at [facility], [address], [place], [place] [address]   MRSA Next Gen by PCR, Nasal     Status: None   Collection Time: 04/09/21 12:27 PM   Specimen: Nasal Mucosa; Nasal Swab  Result Value Ref Range Status   MRSA by PCR Next Gen NOT DETECTED NOT DETECTED Final    Comment: (NOTE) The GeneXpert MRSA Assay (FDA approved for NASAL specimens only), is one component of a comprehensive MRSA colonization  surveillance program. It is not intended to diagnose MRSA infection nor to guide or monitor treatment for MRSA infections. Test performance is not FDA approved in patients less than 38 years old. Performed at [facility], [place] [address] [place], [place] [address]   Aerobic/Anaerobic Culture w Gram Stain (surgical/deep wound)     Status: None (Preliminary result)   Collection Time: 04/10/21  2:18 PM   Specimen: Liver; Abscess  Result Value Ref Range Status   Specimen Description   Final    LIVER CYSTS FLUID Performed at [place] [address] [place], [place] [address]    Special Requests   Final    NONE Performed at [facility], [place] [address] [place], [place] [address]    Gram Stain   Final    FEW SQUAMOUS EPITHELIAL CELLS PRESENT FEW WBC SEEN NO ORGANISMS SEEN    Culture   Final    RARE GRAM NEGATIVE RODS IDENTIFICATION AND SUSCEPTIBILITIES TO FOLLOW Performed at [facility], [place] [address], [place], [place] [address]    Report Status PENDING  Incomplete  Urine Culture     Status: None   Collection Time: 04/14/21  4:00 AM   Specimen: Urine, Clean Catch  Result Value Ref Range Status   Specimen Description   Final    URINE, CLEAN CATCH Performed at [facility], [place] [address] [place], [place] [address]    Special Requests   Final    Normal Performed at [facility], [place] [address], [place], [place] [address]    Culture   Final    NO GROWTH Performed at [facility], [place] [address] [place],  [address]    Report Status 04/15/2021 FINAL  Final  C Difficile Quick Screen (NO PCR Reflex)     Status: None   Collection Time: 04/14/21  7:33 PM   Specimen: STOOL  Result Value Ref Range Status   C Diff antigen NEGATIVE NEGATIVE Final   C Diff  toxin NEGATIVE NEGATIVE Final   C Diff interpretation No C. difficile detected.  Final    Comment:  Performed at [facility], [place] [address] [place], [place] [address]     Labs: Basic Metabolic Panel: Recent Labs  Lab 04/10/21 0925 04/10/21 1510 04/11/21 0609 04/12/21 1032 04/13/21 0441 04/15/21 0534  NA 136  --  134* 132* 138 139  K 3.8  --  3.7 4.1 4.4 4.0  CL 100  --  98 92* 98 105  CO2 28  --  '27 28 31 28  '$ GLUCOSE 173*  --  100* 98 87 120*  BUN 11  --  9 7 5* <5*  CREATININE 1.26*  --  1.15* 1.08* 1.06* 1.28*  CALCIUM 8.1*  --  8.1* 8.1* 8.5* 8.2*  MG  --  1.5*  --  1.6* 2.2  --    Liver Function Tests: Recent Labs  Lab 04/10/21 0925 04/11/21 0609 04/12/21 1032  AST 73* 52* 40  ALT 56* 49* 47*  ALKPHOS 98 80 94  BILITOT 0.6 0.5 0.5  PROT 6.0* 5.6* 6.1*  ALBUMIN 2.2* 2.0* 2.2*   CBC: Recent Labs  Lab 04/10/21 0925 04/11/21 0609 04/12/21 1032 04/13/21 0441 04/15/21 0534  WBC 12.6* 8.8 10.1 7.2 6.2  NEUTROABS 11.2*  --  8.7*  --   --   HGB 9.5* 9.0* 9.3* 9.0* 8.9*  HCT 28.7* 27.4* 28.2* 27.7* 27.9*  MCV 82.2 82.8 82.7 82.7 84.0  PLT 415* 403* 488* 514* 468*   CBG: No results for input(s): GLUCAP in the last 168 hours. Hgb A1c No results for input(s): HGBA1C in the last 72 hours. Lipid Profile No results for input(s): CHOL, HDL, LDLCALC, TRIG, CHOLHDL, LDLDIRECT in the last 72 hours. Thyroid function studies No results for input(s): TSH, T4TOTAL, T3FREE, THYROIDAB in the last 72 hours.  Invalid input(s): FREET3 Urinalysis    Component Value Date/Time   COLORURINE YELLOW 04/08/2021 1135   APPEARANCEUR CLEAR 04/08/2021 1135   LABSPEC 1.009 04/08/2021 1135   PHURINE 6.0 04/08/2021 1135   GLUCOSEU NEGATIVE 04/08/2021 1135   HGBUR MODERATE (A) 04/08/2021 1135   BILIRUBINUR NEGATIVE 04/08/2021 1135   BILIRUBINUR small (A) 03/30/2021 1833   KETONESUR NEGATIVE 04/08/2021 1135   PROTEINUR TRACE (A) 04/08/2021 1135   UROBILINOGEN 1.0 03/30/2021 1833   UROBILINOGEN 0.2 08/31/2009 1605   NITRITE NEGATIVE 04/08/2021 1135    LEUKOCYTESUR SMALL (A) 04/08/2021 1135    FURTHER DISCHARGE INSTRUCTIONS:   Get Medicines reviewed and adjusted: Please take all your medications with you for your next visit with your Primary MD   Laboratory/radiological data: Please request your Primary MD to go over all hospital tests and procedure/radiological results at the follow up, please ask your Primary MD to get all Hospital records sent to his/her office.   In some cases, they will be blood work, cultures and biopsy results pending at the time of your discharge. Please request that your primary care M.D. goes through all the records of your hospital data and follows up on these results.   Also Note the following: If you experience worsening of your admission symptoms, develop shortness of breath, life threatening emergency, suicidal or homicidal thoughts you must seek medical attention immediately by calling 911 or calling your MD immediately  if symptoms less severe.   You must read complete instructions/literature along with all the possible adverse reactions/side effects for all the Medicines you take and that have been prescribed to you. Take any  new Medicines after you have completely understood and accpet all the possible adverse reactions/side effects.    Do not drive when taking Pain medications or sleeping medications (Benzodaizepines)   Do not take more than prescribed Pain, Sleep and Anxiety Medications. It is not advisable to combine anxiety,sleep and pain medications without talking with your primary care practitioner   Special Instructions: If you have smoked or chewed Tobacco  in the last 2 yrs please stop smoking, stop any regular Alcohol  and or any Recreational drug use.   Wear Seat belts while driving.   Please note: You were cared for by a hospitalist during your hospital stay. Once you are discharged, your primary care physician will handle any further medical issues. Please note that NO REFILLS for any  discharge medications will be authorized once you are discharged, as it is imperative that you return to your primary care physician (or establish a relationship with a primary care physician if you do not have one) for your post hospital discharge needs so that they can reassess your need for medications and monitor your lab values.  Time coordinating discharge: 40 minutes  SIGNED:  Marzetta Board, MD, PhD 04/17/2021, 8:15 AM

## 2021-04-17 NOTE — Plan of Care (Signed)
Discharged orders received and reviewed with pt and spouse. Pt allowed to ask questions. No concerns identified. PIV removed without any complications. Follow up appt information provided. Rx send to pharmacy of preference. Pt denies any pain at this time. Discharged to care of husband. No belongings left at bedside.     Problem: Education: Goal: Knowledge of General Education information will improve Description: Including pain rating scale, medication(s)/side effects and non-pharmacologic comfort measures Outcome: Completed/Met   Problem: Health Behavior/Discharge Planning: Goal: Ability to manage health-related needs will improve Outcome: Completed/Met   Problem: Clinical Measurements: Goal: Ability to maintain clinical measurements within normal limits will improve Outcome: Completed/Met Goal: Will remain free from infection Outcome: Completed/Met Goal: Diagnostic test results will improve Outcome: Completed/Met Goal: Respiratory complications will improve Outcome: Completed/Met Goal: Cardiovascular complication will be avoided Outcome: Completed/Met   Problem: Activity: Goal: Risk for activity intolerance will decrease Outcome: Completed/Met   Problem: Nutrition: Goal: Adequate nutrition will be maintained Outcome: Completed/Met   Problem: Coping: Goal: Level of anxiety will decrease Outcome: Completed/Met   Problem: Elimination: Goal: Will not experience complications related to bowel motility Outcome: Completed/Met Goal: Will not experience complications related to urinary retention Outcome: Completed/Met   Problem: Pain Managment: Goal: General experience of comfort will improve Outcome: Completed/Met   Problem: Safety: Goal: Ability to remain free from injury will improve Outcome: Completed/Met   Problem: Skin Integrity: Goal: Risk for impaired skin integrity will decrease Outcome: Completed/Met   

## 2021-04-23 ENCOUNTER — Telehealth (INDEPENDENT_AMBULATORY_CARE_PROVIDER_SITE_OTHER): Payer: Self-pay

## 2021-04-23 NOTE — Telephone Encounter (Signed)
Transition Care Management Unsuccessful Follow-up Telephone Call  Date of discharge and from where:  04/17/21 from [facility]  Attempts:  1st Attempt  Reason for unsuccessful TCM follow-up call:  Left voice message   Thea Silversmith, RN, MSN, BSN, [place] Care Management Coordinator [phone]

## 2021-04-23 NOTE — Telephone Encounter (Signed)
Transition Care Management Follow-up Telephone Call Date of discharge and from where: 04/17/21 from [facility] How have you been since you were released from the hospital? "I am getting a little stronger every day" Any questions or concerns? No  Items Reviewed: Did the pt receive and understand the discharge instructions provided? Yes  Medications obtained and verified? Yes  Other? No  Any new allergies since your discharge? No  Dietary orders reviewed? Yes Do you have support at home? Yes   Home Care and Equipment/Supplies: Were home health services ordered? no If so, what is the name of the agency? N/a  Has the agency set up a time to come to the patient's home? not applicable Were any new equipment or medical supplies ordered?  No What is the name of the medical supply agency? N/a Were you able to get the supplies/equipment? not applicable Do you have any questions related to the use of the equipment or supplies? No  Functional Questionnaire: (I = Independent and D = Dependent) ADLs: I  Bathing/Dressing- I  Meal Prep- I  Eating- I  Maintaining continence- I  Transferring/Ambulation- I  Managing Meds- I  Follow up appointments reviewed:  PCP Hospital f/u appt confirmed? Yes  Scheduled to see Dr. Junius Roads on 04/30/21 @ 11. [facility] f/u appt confirmed? Yes  Scheduled to see Dr. Tommy Medal ID on 05/03/21 @ 11:00 am. Nephrologist Dr. Justin Mend awaiting on appointment from the Office, but has spoken with him. Are transportation arrangements needed? No  If their condition worsens, is the pt aware to call PCP or go to the Emergency Dept.? Yes Was the patient provided with contact information for the PCP's office or ED? Yes Was to pt encouraged to call back with questions or concerns? Yes   Thea Silversmith, RN, MSN, BSN, [place] Care Management Coordinator [phone]

## 2021-04-29 ENCOUNTER — Telehealth: Payer: Self-pay | Admitting: Family Medicine

## 2021-04-29 NOTE — Telephone Encounter (Signed)
Records sent to Dr. Junius Roads at [facility]

## 2021-05-03 ENCOUNTER — Other Ambulatory Visit: Payer: Self-pay

## 2021-05-03 ENCOUNTER — Ambulatory Visit (HOSPITAL_COMMUNITY): Payer: 59 | Admitting: Infectious Disease

## 2021-05-03 ENCOUNTER — Encounter: Payer: Self-pay | Admitting: Infectious Disease

## 2021-05-03 VITALS — BP 116/73 | HR 98 | Temp 97.5°F | Wt 112.8 lb

## 2021-05-03 DIAGNOSIS — K750 Abscess of liver: Secondary | ICD-10-CM | POA: Diagnosis not present

## 2021-05-03 DIAGNOSIS — K7689 Other specified diseases of liver: Secondary | ICD-10-CM

## 2021-05-03 DIAGNOSIS — Q446 Cystic disease of liver: Secondary | ICD-10-CM

## 2021-05-03 DIAGNOSIS — M0580 Other rheumatoid arthritis with rheumatoid factor of unspecified site: Secondary | ICD-10-CM

## 2021-05-03 DIAGNOSIS — Q613 Polycystic kidney, unspecified: Secondary | ICD-10-CM | POA: Diagnosis not present

## 2021-05-03 HISTORY — DX: Other specified diseases of liver: K76.89

## 2021-05-03 HISTORY — DX: Cystic disease of liver: Q44.6

## 2021-05-03 MED ORDER — CEFDINIR 300 MG PO CAPS: 300.0000 mg | ORAL_CAPSULE | Freq: Two times a day (BID) | ORAL | 2 refills | Status: AC

## 2021-05-03 MED ORDER — METRONIDAZOLE 500 MG PO TABS: 500.0000 mg | ORAL_TABLET | Freq: Two times a day (BID) | ORAL | 2 refills | Status: DC

## 2021-05-03 NOTE — Progress Notes (Signed)
Subjective:  Reason for Infectious Disease Consultation: infected liver cyst  Requesting Physician: Marzetta Board, MD  Patient ID: Judy Manning, female    DOB: 05/15/1966, 55 y.o.   MRN: [id number]  HPI  55 year old Caucasian lady with history of polycystic kidney and liver disease, followed by Dr. Edrick Oh from nephrology, presented with several weeks of unexplained fevers up to 104 degrees at 1 point in time rigors and then right shoulder pain that she thought was due to the vomiting that she had had going on as well.  She was seen in the ER at least twice occluding on September 5 where she had a CT of the abdomen pelvis performed which showed chronic findings of polycystic kidney disease.  She was discharged home with supportive care.  with negative work-ups ultimately prescribed azithromycin by her primary care physician.  Unfortunately pain worsened as did fevers.  The pain in her right side as mentioned radiated up to her shoulder and she rated 10 out of 10 in intensity with a sharp quality that got better when she lay on her left side.  Pain was also worse with deep breathing.  Patient returned to the ER on September 8.  At that point in time called her ultrasound showed a dilated common bile duct of 8 mm.  The patient was febrile in the ER and had a leukocytosis with 13,600 white blood cells.   Underwent MRI of the abdomen showed innumerable hepatic lesions which largely seem to be a combination of simple cyst as well as biliary hamartomas some of the larger cyst showing very degrees of complexity.  One of the cysts had some loculations and some concerns for proteinaceous debris and potential infection.  Common bile duct was seen to be dilated to 11 mm at the porta hepatis.  IR was consulted and aspirated one of the liver cysts as well as a renal cyst.  She had been continued on Zosyn on admission.  Dr. Cruzita Lederer discussed the case with me and the difficult time  distinguishing infection being potentially superimposed on her polycystic kidney and liver findings.  She did say that with aspiration of her renal cyst she had dramatic improvement with her pain along with aspiration of the liver cyst and antibiotics in time.  She did state that in the hospital when she was changed from Zosyn to cefdinir and Flagyl that she began noticing that she was having some drenching sweats.  These persisted in the weeks afterwards but have improved since then.  She has not had recurrence of fevers and pain has improved.  She was counseled to seek care at from a tertiary care center with regards to her severe liver and kidney polycystic disease.  She is upcoming appoint with Dr. Justin Mend this next week.         Past Medical History:  Diagnosis Date   COVID 12/2020   Polycystic kidney disease     Past Surgical History:  Procedure Laterality Date   AUGMENTATION MAMMAPLASTY      Family History  Problem Relation Age of Onset   Rheum arthritis Mother    Heart disease Mother        Pacemaker   COPD Mother        Smoker   Skin cancer Mother    Cancer Mother    Stroke Father    Polycystic kidney disease Father    Arthritis Brother    Alzheimer's disease Maternal Grandmother 63   Diabetes Paternal  Grandfather    Skin cancer Paternal Grandfather    Cancer Paternal Grandfather    Breast cancer Neg Hx    Colon cancer Neg Hx       Social History   Socioeconomic History   Marital status: Married    Spouse name: Not on file   Number of children: Not on file   Years of education: Not on file   Highest education level: Not on file  Occupational History   Not on file  Tobacco Use   Smoking status: Never   Smokeless tobacco: Never  Substance and Sexual Activity   Alcohol use: Not Currently    Comment: rarely ever   Drug use: Never   Sexual activity: Not on file  Other Topics Concern   Not on file  Social History Narrative   Not on file    Social Determinants of Health   Financial Resource Strain: Not on file  Food Insecurity: Not on file  Transportation Needs: Not on file  Physical Activity: Not on file  Stress: Not on file  Social Connections: Not on file    Allergies  Allergen Reactions   Imitrex [Sumatriptan] Swelling    Throat gets tight   Ibuprofen Other (See Comments)    Polycystic kidney disease   Nitrofurantoin Rash   Oseltamivir Nausea And Vomiting     Current Outpatient Medications:    Biotin 10000 MCG TABS, Take by mouth., Disp: , Rfl:    cefdinir (OMNICEF) 300 MG capsule, Take 1 capsule (300 mg total) by mouth every 12 (twelve) hours for 22 days., Disp: 44 capsule, Rfl: 0   cetirizine (ZYRTEC) 10 MG tablet, Take 10 mg by mouth daily as needed for allergies (alternates with claritin)., Disp: , Rfl:    Cholecalciferol (VITAMIN D-3) 5000 units TABS, Take by mouth., Disp: , Rfl:    Dapsone 5 % topical gel, Apply 1 application topically 2 (two) times daily., Disp: 60 g, Rfl: 11   gabapentin (NEURONTIN) 300 MG capsule, TAKE 1 CAPSULE BY MOUTH  TWICE DAILY, Disp: 180 capsule, Rfl: 3   guaiFENesin-dextromethorphan (ROBITUSSIN DM) 100-10 MG/5ML syrup, Take 5 mLs by mouth every 4 (four) hours as needed for cough., Disp: 118 mL, Rfl: 0   metroNIDAZOLE (FLAGYL) 500 MG tablet, Take 1 tablet (500 mg total) by mouth every 12 (twelve) hours for 22 days., Disp: 44 tablet, Rfl: 0   Multiple Vitamin (MULTIVITAMIN) tablet, Take 1 tablet by mouth daily., Disp: , Rfl:    ondansetron (ZOFRAN ODT) 4 MG disintegrating tablet, Take 1 tablet (4 mg total) by mouth every 8 (eight) hours as needed for nausea or vomiting., Disp: 10 tablet, Rfl: 0   oxyCODONE (OXY IR/ROXICODONE) 5 MG immediate release tablet, Take 1-2 tablets (5-10 mg total) by mouth every 4 (four) hours as needed for breakthrough pain (not relieved with tramadol)., Disp: 20 tablet, Rfl: 0   tretinoin (RETIN-A) 0.025 % cream, Apply topically at bedtime., Disp: 45 g,  Rfl: 11   zolpidem (AMBIEN) 10 MG tablet, TAKE 1/2 TO 1 TABLET BY  MOUTH AT BEDTIME AS NEEDED  FOR SLEEP, Disp: 90 tablet, Rfl: 1   Review of Systems  Constitutional:  Positive for diaphoresis. Negative for activity change, appetite change, chills, fatigue, fever and unexpected weight change.  HENT:  Negative for congestion, rhinorrhea, sinus pressure, sneezing, sore throat and trouble swallowing.   Eyes:  Negative for photophobia and visual disturbance.  Respiratory:  Negative for cough, chest tightness, shortness of breath, wheezing and stridor.  Cardiovascular:  Negative for chest pain, palpitations and leg swelling.  Gastrointestinal:  Positive for abdominal pain. Negative for abdominal distention, anal bleeding, blood in stool, constipation, diarrhea, nausea and vomiting.  Genitourinary:  Negative for difficulty urinating, dysuria, flank pain and hematuria.  Musculoskeletal:  Negative for arthralgias, back pain, gait problem, joint swelling and myalgias.  Skin:  Negative for color change, pallor, rash and wound.  Neurological:  Negative for dizziness, tremors, weakness and light-headedness.  Hematological:  Negative for adenopathy. Does not bruise/bleed easily.  Psychiatric/Behavioral:  Negative for agitation, behavioral problems, confusion, decreased concentration, dysphoric mood and sleep disturbance.       Objective:   Physical Exam Constitutional:      General: She is not in acute distress.    Appearance: Normal appearance. She is well-developed. She is not ill-appearing or diaphoretic.  HENT:     Head: Normocephalic and atraumatic.     Right Ear: Hearing and external ear normal.     Left Ear: Hearing and external ear normal.     Nose: No nasal deformity or rhinorrhea.  Eyes:     General: No scleral icterus.       Right eye: No discharge.        Left eye: No discharge.     Extraocular Movements: Extraocular movements intact.     Conjunctiva/sclera: Conjunctivae normal.      Right eye: Right conjunctiva is not injected.     Left eye: Left conjunctiva is not injected.     Pupils: Pupils are equal, round, and reactive to light.  Neck:     Vascular: No JVD.  Cardiovascular:     Rate and Rhythm: Normal rate and regular rhythm.     Heart sounds: S1 normal and S2 normal.  Pulmonary:     Effort: Pulmonary effort is normal. No respiratory distress.     Breath sounds: No wheezing.  Abdominal:     General: There is no distension.  Musculoskeletal:        General: Normal range of motion.     Right shoulder: Normal.     Left shoulder: Normal.     Cervical back: Normal range of motion and neck supple.     Right hip: Normal.     Left hip: Normal.     Right knee: Normal.     Left knee: Normal.  Lymphadenopathy:     Head:     Right side of head: No submandibular, preauricular or posterior auricular adenopathy.     Left side of head: No submandibular, preauricular or posterior auricular adenopathy.     Cervical: No cervical adenopathy.     Right cervical: No superficial or deep cervical adenopathy.    Left cervical: No superficial or deep cervical adenopathy.  Skin:    General: Skin is warm and dry.     Coloration: Skin is not pale.     Findings: No abrasion, bruising, ecchymosis, erythema, lesion or rash.     Nails: There is no clubbing.  Neurological:     General: No focal deficit present.     Mental Status: She is alert and oriented to person, place, and time.     Sensory: No sensory deficit.     Coordination: Coordination normal.     Gait: Gait normal.  Psychiatric:        Attention and Perception: She is attentive.        Mood and Affect: Mood normal.        Speech: Speech normal.  Behavior: Behavior normal. Behavior is cooperative.        Thought Content: Thought content normal.        Judgment: Judgment normal.          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  Given  that she had several weeks of drenching sweats spite being on antibiotics have concerned that the infection in her liver has not resolved yet.  Therefore I am going to extend her cefdinir and Flagyl for an additional month and see her back prior to stopping the antibiotics.  I will repeat a BMP today to check her kidney function.  She is going to see Dr. Justin Mend next week.  I had considered getting a CT of the abdomen pelvis again although it may be difficult to distinguish infection amongst the innumerable cysts that were seen in the liver and kidney.  Polycystic kidney and liver disease: Last serum creatinine was 1.28 on April 15, 2021 transaminases and alkaline phosphatase were normal in September 12 and coagulation parameters were normal as well.  She will follow-up with Dr. Justin Mend next week and will consider getting further opinions with regards to management at a tertiary care center.  I assume the main reason for being seen a tertiary care center would be if she needs ultimately organ transplantation, specifically liver transplantation and/or kidney transplantation.  She was on medications to slow her polycystic disease but they were apparently hepatotoxic and also cost $17,000 per month she states.  I spent 82 minutes with the patient including than 50% of the time in face to face counseling of the patient re her infected hepatic cyst, personally reviewing CT abdomen pelvis on September 5 September 9 as well as her ultrasound on September 8 her MRI of the abdomen on April 09, 2021 her cultures from April 10, 2021 along with review of medical records in preparation for the visit and during the visit and in coordination of her care.

## 2021-05-04 LAB — BASIC METABOLIC PANEL WITH GFR
BUN/Creatinine Ratio: 11 (calc) (ref 6–22)
BUN: 14 mg/dL (ref 7–25)
CO2: 27 mmol/L (ref 20–32)
Calcium: 9.3 mg/dL (ref 8.6–10.4)
Chloride: 104 mmol/L (ref 98–110)
Creat: 1.31 mg/dL — ABNORMAL HIGH (ref 0.50–1.03)
Glucose, Bld: 102 mg/dL — ABNORMAL HIGH (ref 65–99)
Potassium: 4.3 mmol/L (ref 3.5–5.3)
Sodium: 140 mmol/L (ref 135–146)
eGFR: 49 mL/min/{1.73_m2} — ABNORMAL LOW (ref >=60)

## 2021-05-06 ENCOUNTER — Telehealth: Payer: Self-pay

## 2021-05-06 NOTE — Telephone Encounter (Signed)
VOB has been submitted for Durolane, left knee. Pending BV

## 2021-05-07 ENCOUNTER — Telehealth: Payer: Self-pay

## 2021-05-07 NOTE — Telephone Encounter (Signed)
Talked with patient about gel injection approval and patient would like to hold off on getting injection at this time.  Will CB when she is ready to proceed.  Approved for Durolane, left knee. [place] has been met Covered at 100% of the allowable No Co-pay No PA required

## 2021-06-08 ENCOUNTER — Ambulatory Visit (INDEPENDENT_AMBULATORY_CARE_PROVIDER_SITE_OTHER): Payer: 59 | Admitting: Infectious Disease

## 2021-06-08 ENCOUNTER — Encounter: Payer: Self-pay | Admitting: Infectious Disease

## 2021-06-08 ENCOUNTER — Other Ambulatory Visit: Payer: Self-pay

## 2021-06-08 VITALS — BP 128/74 | HR 75 | Temp 97.7°F | Wt 115.0 lb

## 2021-06-08 DIAGNOSIS — K7689 Other specified diseases of liver: Secondary | ICD-10-CM

## 2021-06-08 DIAGNOSIS — Z7185 Encounter for immunization safety counseling: Secondary | ICD-10-CM

## 2021-06-08 DIAGNOSIS — Q446 Cystic disease of liver: Secondary | ICD-10-CM | POA: Diagnosis not present

## 2021-06-08 DIAGNOSIS — Q613 Polycystic kidney, unspecified: Secondary | ICD-10-CM | POA: Diagnosis not present

## 2021-06-08 MED ORDER — CEFDINIR 300 MG PO CAPS: 300.0000 mg | ORAL_CAPSULE | Freq: Two times a day (BID) | ORAL | 1 refills | Status: AC

## 2021-06-08 MED ORDER — METRONIDAZOLE 500 MG PO TABS: 500.0000 mg | ORAL_TABLET | Freq: Two times a day (BID) | ORAL | 1 refills | Status: AC

## 2021-06-08 NOTE — Progress Notes (Signed)
Subjective:   Chief complaint: Still having some loose stools twice a day.   Patient ID: Judy Manning, female    DOB: 1965-10-21, 55 y.o.   MRN: [id number]  HPI  55 year old Caucasian lady with history of polycystic kidney and liver disease, followed by Dr. Edrick Oh from nephrology, presented with several weeks of unexplained fevers up to 104 degrees at 1 point in time rigors and then right shoulder pain that she thought was due to the vomiting that she had had going on as well.  She was seen in the ER at least twice occluding on September 5 where she had a CT of the abdomen pelvis performed which showed chronic findings of polycystic kidney disease.  She was discharged home with supportive care.  with negative work-ups ultimately prescribed azithromycin by her primary care physician.  Unfortunately pain worsened as did fevers.  The pain in her right side as mentioned radiated up to her shoulder and she rated 10 out of 10 in intensity with a sharp quality that got better when she lay on her left side.  Pain was also worse with deep breathing.  Patient returned to the ER on September 8.  At that point in time called her ultrasound showed a dilated common bile duct of 8 mm.  The patient was febrile in the ER and had a leukocytosis with 13,600 white blood cells.   Underwent MRI of the abdomen showed innumerable hepatic lesions which largely seem to be a combination of simple cyst as well as biliary hamartomas some of the larger cyst showing very degrees of complexity.  One of the cysts had some loculations and some concerns for proteinaceous debris and potential infection.  Common bile duct was seen to be dilated to 11 mm at the porta hepatis.  IR was consulted and aspirated one of the liver cysts as well as a renal cyst.  She had been continued on Zosyn on admission.  Dr. Cruzita Lederer discussed the case with me and the difficult time distinguishing infection being potentially superimposed  on her polycystic kidney and liver findings.  She did say that with aspiration of her renal cyst she had dramatic improvement with her pain along with aspiration of the liver cyst and antibiotics in time.  She did state that in the hospital when she was changed from Zosyn to cefdinir and Flagyl that she began noticing that she was having some drenching sweats.  These persisted in the weeks afterwards but have improved but were still present when I saw her in clinic.  Since then she has continued on cefdinir and metronidazole and these have completely resolved.  She finished her antibiotics a few days ago and not experience any relapse of fevers or any other systemic symptoms or pain.  He has continued to have loose stools that bother her.  Though they are only twice a day rather than more frequently she was tested for C. difficile in the hospital and I do not expect that she has C. difficile at this point in time.             Past Medical History:  Diagnosis Date   COVID 12/2020   Liver cyst 05/03/2021   Liver, polycystic with compressive symptoms 05/03/2021   Polycystic kidney disease     Past Surgical History:  Procedure Laterality Date   AUGMENTATION MAMMAPLASTY      Family History  Problem Relation Age of Onset   Rheum arthritis Mother  Heart disease Mother        Pacemaker   COPD Mother        Smoker   Skin cancer Mother    Cancer Mother    Stroke Father    Polycystic kidney disease Father    Arthritis Brother    Alzheimer's disease Maternal Grandmother 71   Diabetes Paternal Grandfather    Skin cancer Paternal Grandfather    Cancer Paternal Grandfather    Breast cancer Neg Hx    Colon cancer Neg Hx       Social History   Socioeconomic History   Marital status: Married    Spouse name: Not on file   Number of children: Not on file   Years of education: Not on file   Highest education level: Not on file  Occupational History   Not on file   Tobacco Use   Smoking status: Never   Smokeless tobacco: Never  Substance and Sexual Activity   Alcohol use: Not Currently    Comment: rarely ever   Drug use: Never   Sexual activity: Not on file  Other Topics Concern   Not on file  Social History Narrative   Not on file   Social Determinants of Health   Financial Resource Strain: Not on file  Food Insecurity: Not on file  Transportation Needs: Not on file  Physical Activity: Not on file  Stress: Not on file  Social Connections: Not on file    Allergies  Allergen Reactions   Imitrex [Sumatriptan] Swelling    Throat gets tight   Ibuprofen Other (See Comments)    Polycystic kidney disease   Nitrofurantoin Rash   Oseltamivir Nausea And Vomiting     Current Outpatient Medications:    Biotin 10000 MCG TABS, Take by mouth., Disp: , Rfl:    cetirizine (ZYRTEC) 10 MG tablet, Take 10 mg by mouth daily as needed for allergies (alternates with claritin)., Disp: , Rfl:    Cholecalciferol (VITAMIN D-3) 5000 units TABS, Take by mouth., Disp: , Rfl:    Dapsone 5 % topical gel, Apply 1 application topically 2 (two) times daily., Disp: 60 g, Rfl: 11   gabapentin (NEURONTIN) 300 MG capsule, TAKE 1 CAPSULE BY MOUTH  TWICE DAILY, Disp: 180 capsule, Rfl: 3   guaiFENesin-dextromethorphan (ROBITUSSIN DM) 100-10 MG/5ML syrup, Take 5 mLs by mouth every 4 (four) hours as needed for cough., Disp: 118 mL, Rfl: 0   Multiple Vitamin (MULTIVITAMIN) tablet, Take 1 tablet by mouth daily., Disp: , Rfl:    ondansetron (ZOFRAN ODT) 4 MG disintegrating tablet, Take 1 tablet (4 mg total) by mouth every 8 (eight) hours as needed for nausea or vomiting., Disp: 10 tablet, Rfl: 0   oxyCODONE (OXY IR/ROXICODONE) 5 MG immediate release tablet, Take 1-2 tablets (5-10 mg total) by mouth every 4 (four) hours as needed for breakthrough pain (not relieved with tramadol)., Disp: 20 tablet, Rfl: 0   predniSONE (DELTASONE) 5 MG tablet, Take 10 mg by mouth 2 (two) times  daily., Disp: , Rfl:    tretinoin (RETIN-A) 0.025 % cream, Apply topically at bedtime., Disp: 45 g, Rfl: 11   zolpidem (AMBIEN) 10 MG tablet, TAKE 1/2 TO 1 TABLET BY  MOUTH AT BEDTIME AS NEEDED  FOR SLEEP, Disp: 90 tablet, Rfl: 1   Review of Systems  Constitutional:  Negative for chills and fever.  HENT:  Negative for congestion and sore throat.   Eyes:  Negative for photophobia.  Respiratory:  Negative for cough,  shortness of breath and wheezing.   Cardiovascular:  Negative for chest pain, palpitations and leg swelling.  Gastrointestinal:  Positive for diarrhea. Negative for abdominal pain, blood in stool, constipation, nausea and vomiting.  Genitourinary:  Negative for dysuria, flank pain and hematuria.  Musculoskeletal:  Negative for back pain and myalgias.  Skin:  Negative for rash.  Neurological:  Negative for dizziness, weakness and headaches.  Hematological:  Does not bruise/bleed easily.  Psychiatric/Behavioral:  Negative for suicidal ideas.       Objective:   Physical Exam Constitutional:      General: She is not in acute distress.    Appearance: Normal appearance. She is well-developed. She is not ill-appearing or diaphoretic.  HENT:     Head: Normocephalic and atraumatic.     Right Ear: Hearing and external ear normal.     Left Ear: Hearing and external ear normal.     Nose: No nasal deformity or rhinorrhea.  Eyes:     General: No scleral icterus.    Conjunctiva/sclera: Conjunctivae normal.     Right eye: Right conjunctiva is not injected.     Left eye: Left conjunctiva is not injected.     Pupils: Pupils are equal, round, and reactive to light.  Neck:     Vascular: No JVD.  Cardiovascular:     Rate and Rhythm: Normal rate and regular rhythm.     Heart sounds: Normal heart sounds, S1 normal and S2 normal. No murmur heard.   No friction rub.  Abdominal:     General: Bowel sounds are normal. There is no distension.     Palpations: Abdomen is soft.     Tenderness:  There is no abdominal tenderness.  Musculoskeletal:        General: Normal range of motion.     Right shoulder: Normal.     Left shoulder: Normal.     Cervical back: Normal range of motion and neck supple.     Right hip: Normal.     Left hip: Normal.     Right knee: Normal.     Left knee: Normal.  Lymphadenopathy:     Head:     Right side of head: No submandibular, preauricular or posterior auricular adenopathy.     Left side of head: No submandibular, preauricular or posterior auricular adenopathy.     Cervical: No cervical adenopathy.     Right cervical: No superficial or deep cervical adenopathy.    Left cervical: No superficial or deep cervical adenopathy.  Skin:    General: Skin is warm and dry.     Coloration: Skin is not pale.     Findings: No abrasion, bruising, ecchymosis, erythema, lesion or rash.     Nails: There is no clubbing.  Neurological:     Mental Status: She is alert and oriented to person, place, and time.     Sensory: No sensory deficit.     Coordination: Coordination normal.     Gait: Gait normal.  Psychiatric:        Attention and Perception: She is attentive.        Mood and Affect: Mood normal.        Speech: Speech normal.        Behavior: Behavior normal. Behavior is cooperative.        Thought Content: Thought content normal.        Judgment: Judgment normal.          Assessment & Plan:  Infected liver cyst:  Clinically she seems to have had resolution of this infection.  She is opted to come back as needed to our clinic if she has symptoms of fever or abdominal pain or other things to suggest recurrence of her infected liver cyst.  Polycystic kidney and liver disease: Creatinine is 1.28 on recent check.  She is followed closely with Dr. Justin Mend.  Vaccine counseling I recommended she get vaccinated for flu and for COVID-19 with updated booster but she declined both today.  She said that Dr. Justin Mend another physician had recommended against it  at this point in time.

## 2021-06-30 ENCOUNTER — Encounter: Payer: Self-pay | Admitting: Physical Medicine and Rehabilitation

## 2021-06-30 ENCOUNTER — Other Ambulatory Visit: Payer: Self-pay

## 2021-06-30 ENCOUNTER — Ambulatory Visit (INDEPENDENT_AMBULATORY_CARE_PROVIDER_SITE_OTHER): Payer: 59 | Admitting: Physical Medicine and Rehabilitation

## 2021-06-30 DIAGNOSIS — R202 Paresthesia of skin: Secondary | ICD-10-CM

## 2021-06-30 NOTE — Progress Notes (Signed)
Pt state pin and needles pain in both feet. Pt state she feels pain from her arch to her big toe. Pt state standing makes the pain worse and PT didn't help it gotten worse. Pt state she use heat and ice to help ease her pain.  Numeric Pain Rating Scale and Functional Assessment Average Pain 4   In the last MONTH (on 0-10 scale) has pain interfered with the following?  1. General activity like being  able to carry out your everyday physical activities such as walking, climbing stairs, carrying groceries, or moving a chair?  Rating(10)   -BT, -Dye Allergies.

## 2021-06-30 NOTE — Progress Notes (Signed)
Judy Manning - 55 y.o. female MRN [id number]  Date of birth: 1966-04-28  Office Visit Note: Visit Date: 06/30/2021 PCP: Eunice Blase, MD Referred by: Eunice Blase, MD  Subjective: Chief Complaint  Patient presents with   Right Foot - Pain, Numbness   Left Foot - Pain, Numbness   HPI:  Judy Manning is a 55 y.o. female who comes in today at the request of Dr. Eunice Blase for electrodiagnostic study of the Bilateral Lower extremities.  She reports numbness tingling and pain in both feet that started on the right and now have progressed to the left.  She reports symptoms from the balls of the feet to the great toe.  A lot of numbness and tingling in general.  She has had MRI of the right foot without really any findings.  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  She has had a prior history of carpal tunnel syndrome.  I believe she had a release done on the right but not on the left.  We do not have those electrodiagnostic studies to review.  ROS Otherwise per HPI.  Assessment & Plan: Visit Diagnoses:    ICD-10-CM   1. Paresthesia of skin  R20.2 NCV with EMG (electromyography)      Plan: Impression: Essentially NORMAL electrodiagnostic study of both lower limbs.  There is no significant electrodiagnostic evidence of nerve entrapment, lumbosacral plexopathy, lumbar radiculopathy or generalized peripheral neuropathy.  As you know, purely sensory or demyelinating radiculopathies and chemical radiculitis may not be detected with this particular electrodiagnostic study.  Recommendations: 1.  Follow-up with referring physician. 2.  Continue current management of symptoms.   Meds & Orders: No orders of the defined types were placed in this encounter.   Orders Placed This Encounter  Procedures   NCV with EMG (electromyography)    Follow-up:  Return in about 2 weeks (around 07/14/2021) for Eunice Blase, MD.   Procedures: No procedures performed  EMG & NCV Findings: Evaluation of the left saphenous sensory nerve showed reduced amplitude (1.7 V).  All remaining nerves (as indicated in the following tables) were within normal limits.  Left vs. Right side comparison data for the tibial motor nerve indicates abnormal L-R velocity difference (Knee-Ankle, 11 m/s).  All remaining left vs. right side differences were within normal limits.    All examined muscles (as indicated in the following table) showed no evidence of electrical instability.    Impression: Essentially NORMAL electrodiagnostic study of both lower limbs.  There is no significant electrodiagnostic evidence of nerve entrapment, lumbosacral plexopathy, lumbar radiculopathy or generalized peripheral neuropathy.  As you know, purely sensory or demyelinating radiculopathies and chemical radiculitis may not be detected with this particular electrodiagnostic study.  Recommendations: 1.  Follow-up with referring physician. 2.  Continue current management of symptoms.  ___________________________ Laurence Spates FAAPMR Board Certified, American Board of Physical Medicine and Rehabilitation    Nerve Conduction Studies Anti Sensory Summary Table   Stim Site NR Peak (ms) Norm Peak (ms) P-T Amp (V) Norm P-T Amp Site1 Site2 Delta-P (ms) Dist (cm) Vel (m/s) Norm Vel (m/s)  Left Saphenous Anti Sensory (Ant Med Mall)  30.9C  14cm    3.2 <4.4 *1.7 >2 14cm Ant Med Mall 3.2 0.0  >32  Right Saphenous Anti Sensory (Ant Med Mall)  30.9C  14cm    3.4 <4.4 3.8 >  2 14cm Ant Med Mall 3.4 0.0  >32  Left Sup Fibular Anti Sensory (Ant Lat Mall)  31C  14 cm    4.2 <4.4 5.4 >5.0 14 cm Ant Lat Mall 4.2 14.0 33 >32  Right Sup Fibular Anti Sensory (Ant Lat Mall)  31C  14 cm    3.4 <4.4 9.4 >5.0 14 cm Ant Lat Mall 3.4 14.0 41 >32  Site 2    3.5  16.6         Left Sural Anti Sensory (Lat Mall)   31C  Calf    3.6 <4.0 15.6 >5.0 Calf Lat Mall 3.6 14.0 39 >35  Right Sural Anti Sensory (Lat Mall)  30.7C  Calf    3.9 <4.0 12.0 >5.0 Calf Lat Mall 3.9 14.0 36 >35   Motor Summary Table   Stim Site NR Onset (ms) Norm Onset (ms) O-P Amp (mV) Norm O-P Amp Site1 Site2 Delta-0 (ms) Dist (cm) Vel (m/s) Norm Vel (m/s)  Left Fibular Motor (Ext Dig Brev)  30.9C  Ankle    4.1 <6.1 5.7 >2.5 B Fib Ankle 5.5 27.0 49 >38  B Fib    9.6  5.3  Poplt B Fib 2.4 10.0 42 >40  Poplt    12.0  5.0         Right Fibular Motor (Ext Dig Brev)  30.5C  Ankle    3.6 <6.1 5.7 >2.5 B Fib Ankle 5.5 27.5 50 >38  B Fib    9.1  5.1  Poplt B Fib 1.5 9.0 60 >40  Poplt    10.6  5.1         Left Tibial Motor (Abd Hall Brev)  30.9C  Ankle    4.4 <6.1 9.0 >3.0 Knee Ankle 7.3 31.0 42 >35  Knee    11.7  9.4         Right Tibial Motor (Abd Hall Brev)  31.1C  Ankle    3.5 <6.1 11.8 >3.0 Knee Ankle 5.8 31.0 53 >35  Knee    9.3  4.1          EMG   Side Muscle Nerve Root Ins Act Fibs Psw Amp Dur Poly Recrt Int Fraser Din Comment  Right AntTibialis Dp Br Peron L4-5 Nml Nml Nml Nml Nml 0 Nml Nml   Right Fibularis Longus  Sup Br Peron L5-S1 Nml Nml Nml Nml Nml 0 Nml Nml   Right MedGastroc Tibial S1-2 Nml Nml Nml Nml Nml 0 Nml Nml   Right VastusMed Femoral L2-4 Nml Nml Nml Nml Nml 0 Nml Nml   Right BicepsFemS Sciatic L5-S1 Nml Nml Nml Nml Nml 0 Nml Nml     Nerve Conduction Studies Anti Sensory Left/Right Comparison   Stim Site L Lat (ms) R Lat (ms) L-R Lat (ms) L Amp (V) R Amp (V) L-R Amp (%) Site1 Site2 L Vel (m/s) R Vel (m/s) L-R Vel (m/s)  Saphenous Anti Sensory (Ant Med Mall)  30.9C  14cm 3.2 3.4 0.2 *1.7 3.8 55.3 14cm Ant Med Mall     Sup Fibular Anti Sensory (Ant Lat Mall)  31C  14 cm 4.2 3.4 0.8 5.4 9.4 42.6 14 cm Ant Lat Mall 33 41 8  Sural Anti Sensory (Lat Mall)  31C  Calf 3.6 3.9 0.3 15.6 12.0 23.1 Calf Lat Mall 39 36 3   Motor Left/Right Comparison   Stim Site L Lat (ms) R Lat (ms) L-R Lat (ms) L Amp (mV)  R Amp (mV) L-R Amp (%)  Site1 Site2 L Vel (m/s) R Vel (m/s) L-R Vel (m/s)  Fibular Motor (Ext Dig Brev)  30.9C  Ankle 4.1 3.6 0.5 5.7 5.7 0.0 B Fib Ankle 49 50 1  B Fib 9.6 9.1 0.5 5.3 5.1 3.8 Poplt B Fib 42 60 18  Poplt 12.0 10.6 1.4 5.0 5.1 2.0       Tibial Motor (Abd Hall Brev)  30.9C  Ankle 4.4 3.5 0.9 9.0 11.8 23.7 Knee Ankle 42 53 *11  Knee 11.7 9.3 2.4 9.4 4.1 56.4          Waveforms:                     Clinical History: MR OF THE RIGHT HEEL WITHOUT CONTRAST   TECHNIQUE: Multiplanar, multisequence MR imaging of the right ankle was performed. No intravenous contrast was administered.   COMPARISON:  None.   FINDINGS: TENDONS   Peroneal: Peroneal longus tendon intact. Peroneal brevis intact.   Posteromedial: Posterior tibial tendon intact. Flexor digitorum longus tendon intact. Flexor hallucis longus tendon intact.   Anterior: Tibialis anterior tendon intact. Extensor hallucis longus tendon intact Extensor digitorum longus tendon intact.   Achilles:  Intact.   Plantar Fascia: Intact.   LIGAMENTS   Lateral: Anterior talofibular ligament intact. Calcaneofibular ligament intact. Posterior talofibular ligament intact. Anterior and posterior tibiofibular ligaments intact.   Medial: Deltoid ligament intact. Spring ligament intact.   CARTILAGE   Ankle Joint: Small joint effusion. Normal ankle mortise. No chondral defect.   Subtalar Joints/Sinus Tarsi: Edema in the posterior talus with small posterior subtalar marginal osteophytes. No subtalar joint effusion. Normal sinus tarsi.   Bones: No acute fracture or dislocation.  No suspicious bone lesion.   Soft Tissue: No soft tissue mass or fluid collection.   IMPRESSION: 1. Normal Achilles tendon and plantar fascia. 2. Mild posterior subtalar osteoarthritis.     Electronically Signed   By: Titus Dubin M.D.   On: 03/08/2021 15:39     Objective:  VS:  HT:    WT:   BMI:     BP:   HR: bpm   TEMP: ( )  RESP:  Physical Exam Vitals and nursing note reviewed.  Constitutional:      General: She is not in acute distress.    Appearance: Normal appearance. She is not ill-appearing.  HENT:     Head: Normocephalic and atraumatic.     Right Ear: External ear normal.     Left Ear: External ear normal.  Eyes:     Extraocular Movements: Extraocular movements intact.  Cardiovascular:     Rate and Rhythm: Normal rate.     Pulses: Normal pulses.  Pulmonary:     Effort: Pulmonary effort is normal. No respiratory distress.  Abdominal:     General: There is no distension.     Palpations: Abdomen is soft.  Musculoskeletal:        General: Tenderness present.     Cervical back: Neck supple.     Right lower leg: No edema.     Left lower leg: No edema.     Comments: Patient has good distal strength with no clonus or focal weakness.  Sensation grossly intact.  There is no allodynia or swelling or color change.  There is no intrinsic foot atrophy or lower limb atrophy.  No rashes.  Skin:    Findings: No erythema, lesion or rash.  Neurological:     General: No focal deficit present.  Mental Status: She is alert and oriented to person, place, and time.     Sensory: No sensory deficit.     Motor: No weakness or abnormal muscle tone.     Coordination: Coordination normal.  Psychiatric:        Mood and Affect: Mood normal.        Behavior: Behavior normal.     Imaging: No results found.

## 2021-06-30 NOTE — Procedures (Signed)
EMG & NCV Findings: Evaluation of the left saphenous sensory nerve showed reduced amplitude (1.7 V).  All remaining nerves (as indicated in the following tables) were within normal limits.  Left vs. Right side comparison data for the tibial motor nerve indicates abnormal L-R velocity difference (Knee-Ankle, 11 m/s).  All remaining left vs. right side differences were within normal limits.    All examined muscles (as indicated in the following table) showed no evidence of electrical instability.    Impression: Essentially NORMAL electrodiagnostic study of both lower limbs.  There is no significant electrodiagnostic evidence of nerve entrapment, lumbosacral plexopathy, lumbar radiculopathy or generalized peripheral neuropathy.  As you know, purely sensory or demyelinating radiculopathies and chemical radiculitis may not be detected with this particular electrodiagnostic study.  Recommendations: 1.  Follow-up with referring physician. 2.  Continue current management of symptoms.  ___________________________ Laurence Spates FAAPMR Board Certified, American Board of Physical Medicine and Rehabilitation    Nerve Conduction Studies Anti Sensory Summary Table   Stim Site NR Peak (ms) Norm Peak (ms) P-T Amp (V) Norm P-T Amp Site1 Site2 Delta-P (ms) Dist (cm) Vel (m/s) Norm Vel (m/s)  Left Saphenous Anti Sensory (Ant Med Mall)  30.9C  14cm    3.2 <4.4 *1.7 >2 14cm Ant Med Mall 3.2 0.0  >32  Right Saphenous Anti Sensory (Ant Med Mall)  30.9C  14cm    3.4 <4.4 3.8 >2 14cm Ant Med Mall 3.4 0.0  >32  Left Sup Fibular Anti Sensory (Ant Lat Mall)  31C  14 cm    4.2 <4.4 5.4 >5.0 14 cm Ant Lat Mall 4.2 14.0 33 >32  Right Sup Fibular Anti Sensory (Ant Lat Mall)  31C  14 cm    3.4 <4.4 9.4 >5.0 14 cm Ant Lat Mall 3.4 14.0 41 >32  Site 2    3.5  16.6         Left Sural Anti Sensory (Lat Mall)  31C  Calf    3.6 <4.0 15.6 >5.0 Calf Lat Mall 3.6 14.0 39 >35  Right Sural Anti Sensory (Lat Mall)  30.7C   Calf    3.9 <4.0 12.0 >5.0 Calf Lat Mall 3.9 14.0 36 >35   Motor Summary Table   Stim Site NR Onset (ms) Norm Onset (ms) O-P Amp (mV) Norm O-P Amp Site1 Site2 Delta-0 (ms) Dist (cm) Vel (m/s) Norm Vel (m/s)  Left Fibular Motor (Ext Dig Brev)  30.9C  Ankle    4.1 <6.1 5.7 >2.5 B Fib Ankle 5.5 27.0 49 >38  B Fib    9.6  5.3  Poplt B Fib 2.4 10.0 42 >40  Poplt    12.0  5.0         Right Fibular Motor (Ext Dig Brev)  30.5C  Ankle    3.6 <6.1 5.7 >2.5 B Fib Ankle 5.5 27.5 50 >38  B Fib    9.1  5.1  Poplt B Fib 1.5 9.0 60 >40  Poplt    10.6  5.1         Left Tibial Motor (Abd Hall Brev)  30.9C  Ankle    4.4 <6.1 9.0 >3.0 Knee Ankle 7.3 31.0 42 >35  Knee    11.7  9.4         Right Tibial Motor (Abd Hall Brev)  31.1C  Ankle    3.5 <6.1 11.8 >3.0 Knee Ankle 5.8 31.0 53 >35  Knee    9.3  4.1  EMG   Side Muscle Nerve Root Ins Act Fibs Psw Amp Dur Poly Recrt Int Fraser Din Comment  Right AntTibialis Dp Br Peron L4-5 Nml Nml Nml Nml Nml 0 Nml Nml   Right Fibularis Longus  Sup Br Peron L5-S1 Nml Nml Nml Nml Nml 0 Nml Nml   Right MedGastroc Tibial S1-2 Nml Nml Nml Nml Nml 0 Nml Nml   Right VastusMed Femoral L2-4 Nml Nml Nml Nml Nml 0 Nml Nml   Right BicepsFemS Sciatic L5-S1 Nml Nml Nml Nml Nml 0 Nml Nml     Nerve Conduction Studies Anti Sensory Left/Right Comparison   Stim Site L Lat (ms) R Lat (ms) L-R Lat (ms) L Amp (V) R Amp (V) L-R Amp (%) Site1 Site2 L Vel (m/s) R Vel (m/s) L-R Vel (m/s)  Saphenous Anti Sensory (Ant Med Mall)  30.9C  14cm 3.2 3.4 0.2 *1.7 3.8 55.3 14cm Ant Med Mall     Sup Fibular Anti Sensory (Ant Lat Mall)  31C  14 cm 4.2 3.4 0.8 5.4 9.4 42.6 14 cm Ant Lat Mall 33 41 8  Sural Anti Sensory (Lat Mall)  31C  Calf 3.6 3.9 0.3 15.6 12.0 23.1 Calf Lat Mall 39 36 3   Motor Left/Right Comparison   Stim Site L Lat (ms) R Lat (ms) L-R Lat (ms) L Amp (mV) R Amp (mV) L-R Amp (%) Site1 Site2 L Vel (m/s) R Vel (m/s) L-R Vel (m/s)  Fibular Motor (Ext Dig Brev)   30.9C  Ankle 4.1 3.6 0.5 5.7 5.7 0.0 B Fib Ankle 49 50 1  B Fib 9.6 9.1 0.5 5.3 5.1 3.8 Poplt B Fib 42 60 18  Poplt 12.0 10.6 1.4 5.0 5.1 2.0       Tibial Motor (Abd Hall Brev)  30.9C  Ankle 4.4 3.5 0.9 9.0 11.8 23.7 Knee Ankle 42 53 *11  Knee 11.7 9.3 2.4 9.4 4.1 56.4          Waveforms:

## 2021-07-08 ENCOUNTER — Ambulatory Visit: Payer: Self-pay

## 2021-07-08 ENCOUNTER — Other Ambulatory Visit: Payer: Self-pay

## 2021-07-08 ENCOUNTER — Ambulatory Visit (INDEPENDENT_AMBULATORY_CARE_PROVIDER_SITE_OTHER): Payer: 59 | Admitting: Orthopedic Surgery

## 2021-07-08 ENCOUNTER — Ambulatory Visit (INDEPENDENT_AMBULATORY_CARE_PROVIDER_SITE_OTHER): Payer: 59

## 2021-07-08 DIAGNOSIS — M79672 Pain in left foot: Secondary | ICD-10-CM

## 2021-07-08 DIAGNOSIS — R200 Anesthesia of skin: Secondary | ICD-10-CM

## 2021-07-08 DIAGNOSIS — M79671 Pain in right foot: Secondary | ICD-10-CM

## 2021-07-08 DIAGNOSIS — M545 Low back pain, unspecified: Secondary | ICD-10-CM

## 2021-07-08 DIAGNOSIS — R202 Paresthesia of skin: Secondary | ICD-10-CM

## 2021-07-09 ENCOUNTER — Encounter: Payer: Self-pay | Admitting: Orthopedic Surgery

## 2021-07-16 ENCOUNTER — Ambulatory Visit
Admission: RE | Admit: 2021-07-16 | Discharge: 2021-07-16 | Disposition: A | Discharge status: Home / Self Care | Payer: 59 | Source: Ambulatory Visit | Attending: Orthopedic Surgery | Admitting: Orthopedic Surgery

## 2021-07-16 ENCOUNTER — Other Ambulatory Visit: Payer: Self-pay

## 2021-07-16 DIAGNOSIS — R200 Anesthesia of skin: Secondary | ICD-10-CM

## 2021-07-16 DIAGNOSIS — R202 Paresthesia of skin: Secondary | ICD-10-CM

## 2021-07-16 IMAGING — MR MR LUMBAR SPINE W/O CM
4 of 5 series · 27 of 48 positions shown · non-contrast
Comparison: No prior MRI, correlation is made with radiographs
[DATE].

CLINICAL DATA: Bilateral foot pain and numbness

EXAM:
MRI LUMBAR SPINE WITHOUT CONTRAST
TECHNIQUE: Multiplanar, multisequence MR imaging of the lumbar spine was
performed. No intravenous contrast was administered.

[Series 3: T1 · sagittal · 4.0mm · 1.09mm/px · 6 of 15 slices shown (1 of 2)]
[im 1/15]
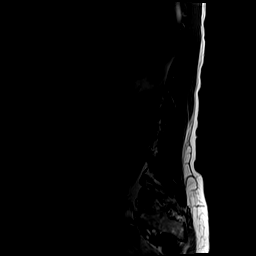
[im 3/15]
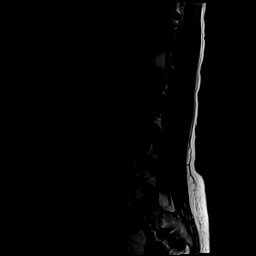
[im 6/15]
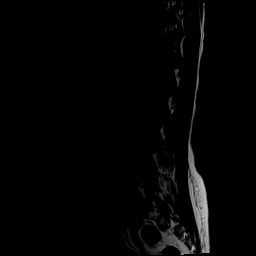
[im 9/15]
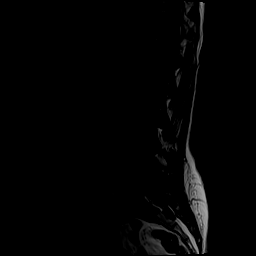
[im 12/15]
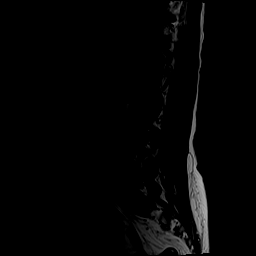
[im 15/15]
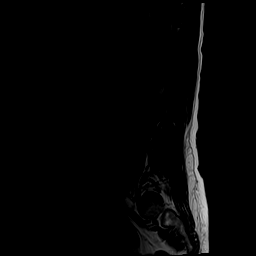

[Series 4: T2 · sagittal · 4.0mm · 1.09mm/px · 6 of 15 slices shown (1 of 2)]
[im 1/15]
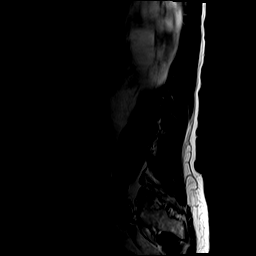
[im 3/15]
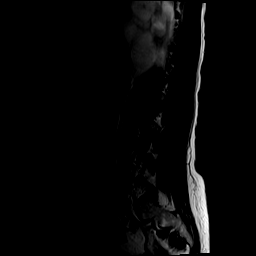
[im 6/15]
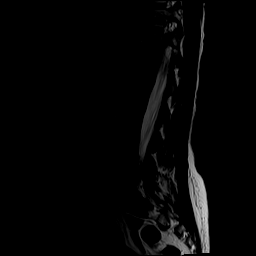
[im 9/15]
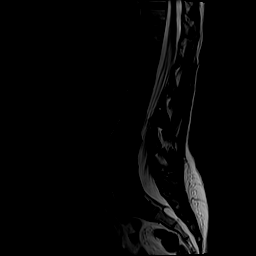
[im 12/15]
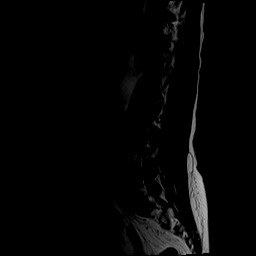
[im 15/15]
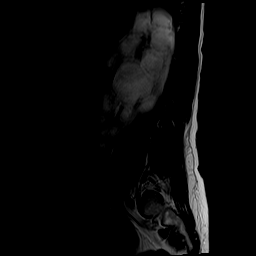

[Series 6: T2 · axial · 4.0mm · 0.39mm/px · z∈[-50,+151]mm · 9 of 37 slices shown (2 of 2)]
[im 1/37]
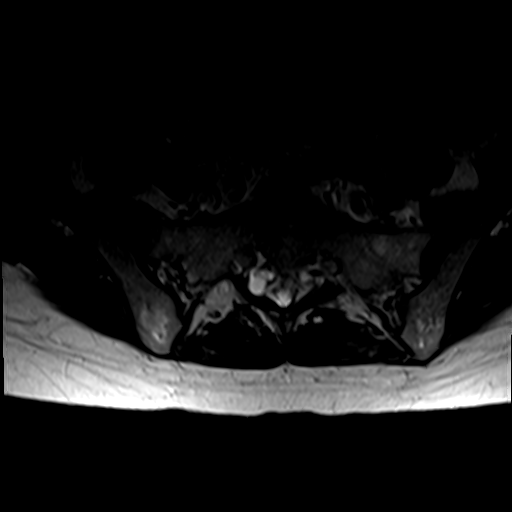
[im 6/37]
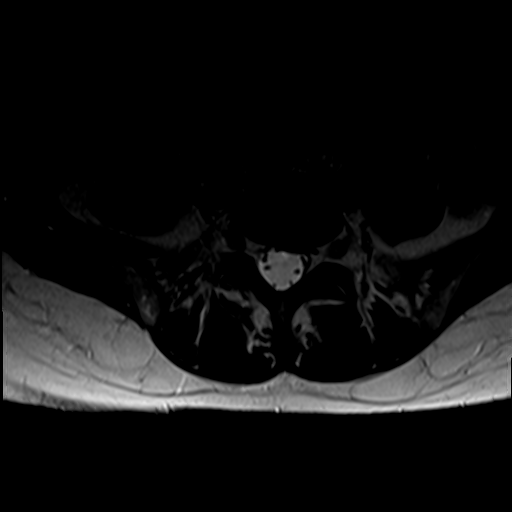
[im 11/37]
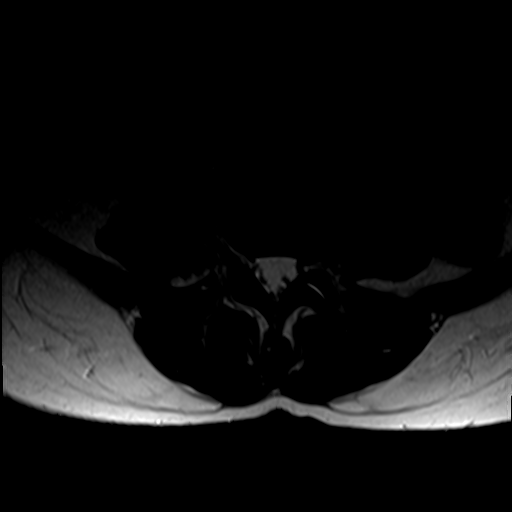
[im 16/37]
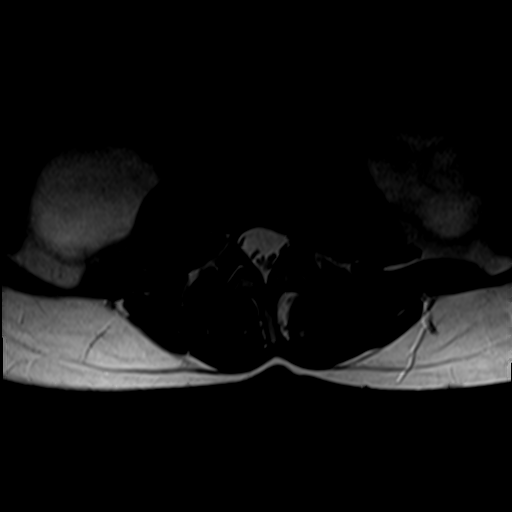
[im 19/37]
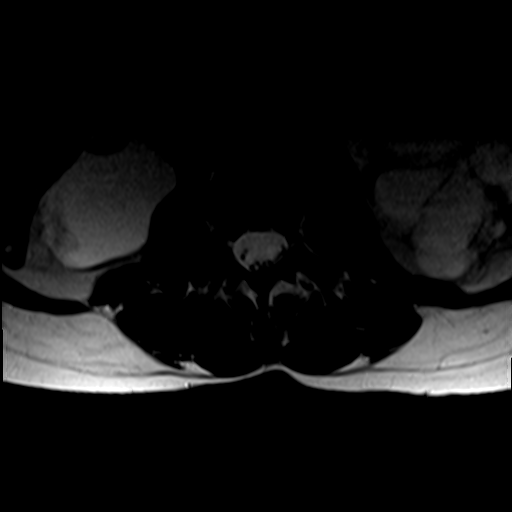
[im 21/37]
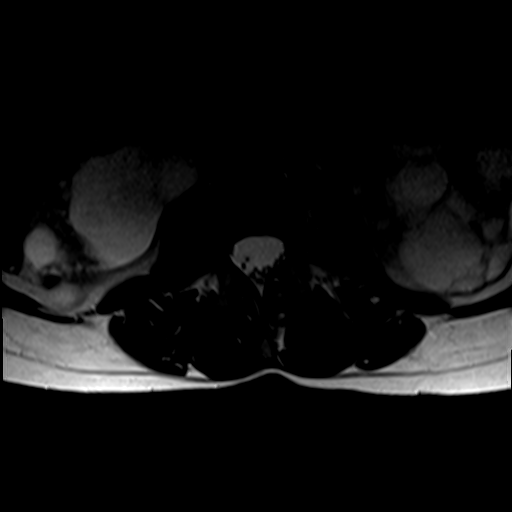
[im 26/37]
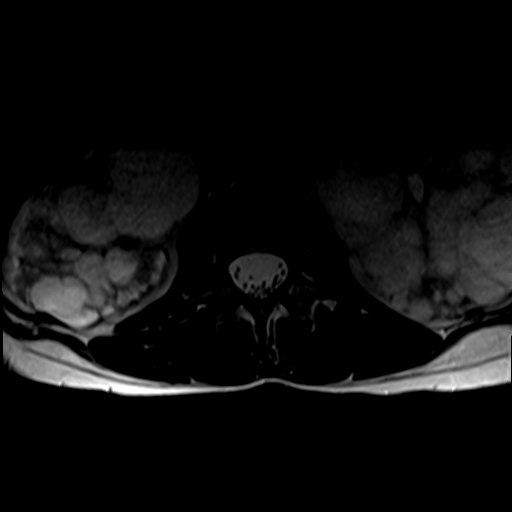
[im 31/37]
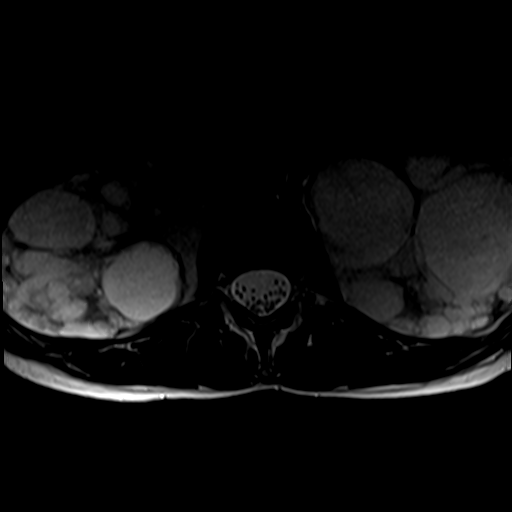
[im 37/37]
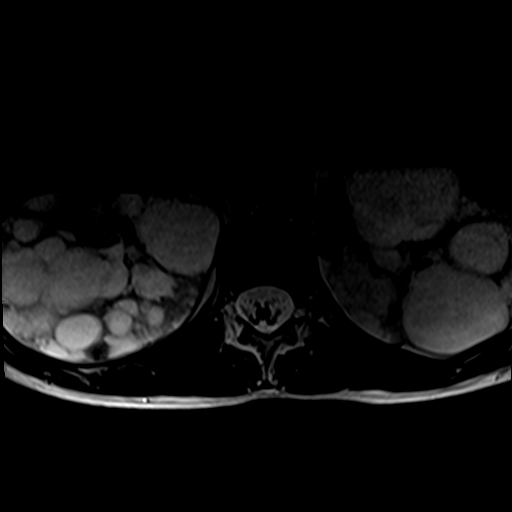

[Series 7: T1 · axial · 4.0mm · 0.39mm/px · z∈[-50,+122]mm · 6 of 37 slices shown (2 of 2)]
[im 1/37]
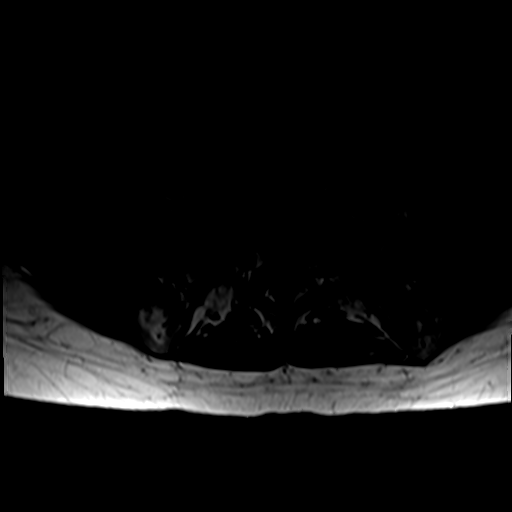
[im 6/37]
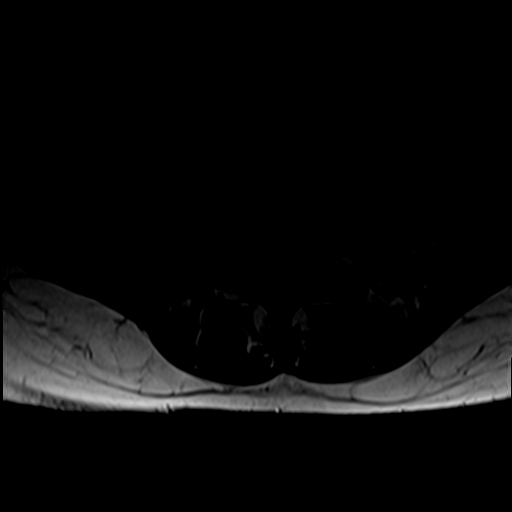
[im 11/37]
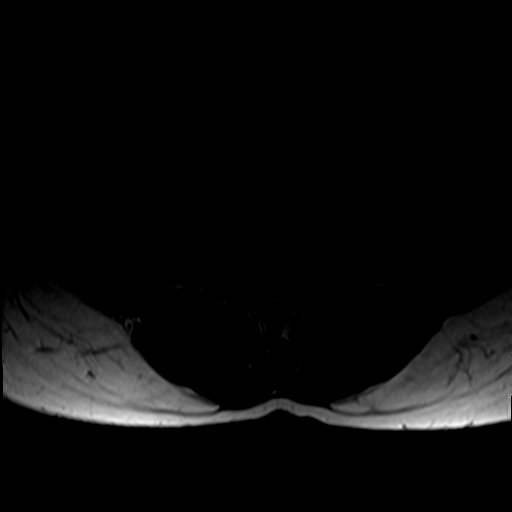
[im 16/37]
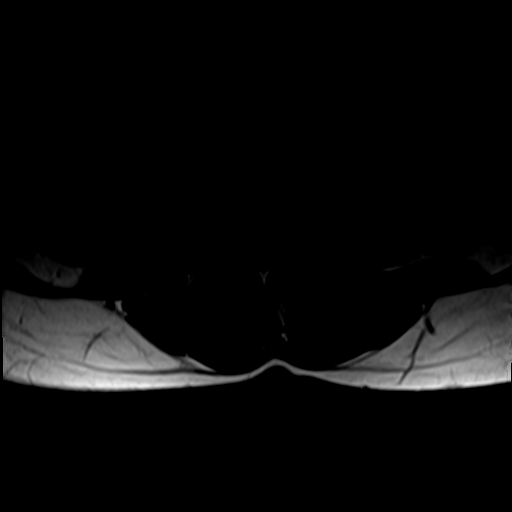
[im 19/37]
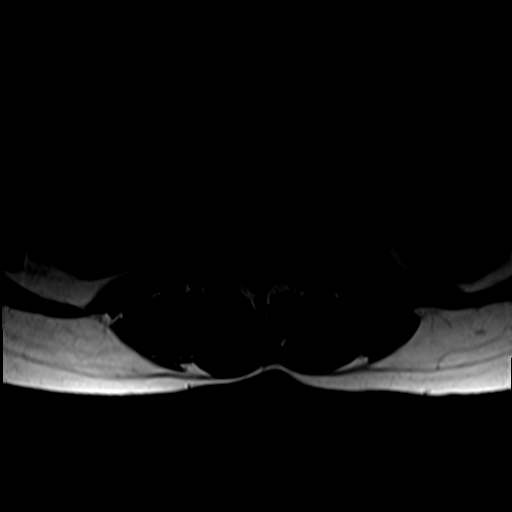
[im 31/37]
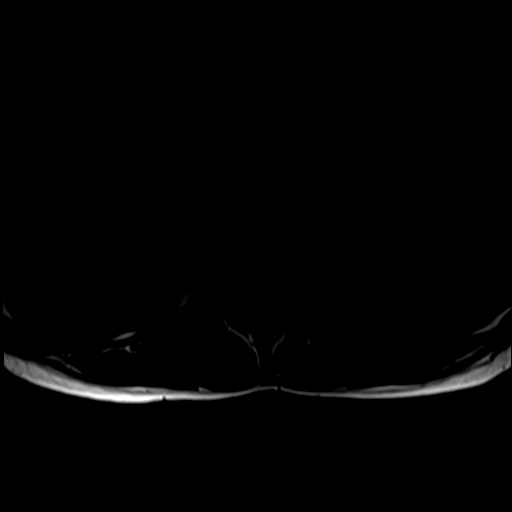

[27 of 48 positions shown; findings below may reference images not displayed]

FINDINGS: Segmentation:  Standard.

Alignment: S shaped curvature of the thoracolumbar spine. No
listhesis.

Vertebrae: No acute fracture or suspicious osseous lesion. Right S2
Tarlov cysts.

Conus medullaris and cauda equina: Conus extends to the T12-L1
level. Conus and cauda equina appear normal.

Paraspinal and other soft tissues: Innumerable cysts in the
bilateral kidneys. Multiple fall cysts in the liver. Otherwise
negative.

Disc levels:

T12-L1: No significant disc bulge. No spinal canal stenosis or
neural foraminal narrowing.

L1-L2: No significant disc bulge. No spinal canal stenosis or neural
foraminal narrowing.

L2-L3: No significant disc bulge. No spinal canal stenosis or neural
foraminal narrowing.

L3-L4: Mild disc bulge with superimposed left foraminal protrusion.
Mild narrowing of the left lateral recess. No spinal canal stenosis
or neural foraminal narrowing.

L4-L5: No significant disc bulge. Mild facet arthropathy. No spinal
canal stenosis or neural foraminal narrowing.

L5-S1: No significant disc bulge. No spinal canal stenosis or neural
foraminal narrowing.
IMPRESSION: 1. Mild degenerative changes with a mild disc bulge that causes mild
narrowing of the left lateral recess, which could affect the
descending left L4 nerve.
2. Mild facet arthropathy at L4-L5.
3. No spinal canal stenosis or neural foraminal narrowing.

## 2021-07-19 ENCOUNTER — Encounter: Payer: Self-pay | Admitting: Orthopedic Surgery

## 2021-07-22 ENCOUNTER — Encounter: Payer: Self-pay | Admitting: Orthopedic Surgery

## 2021-07-22 ENCOUNTER — Encounter: Payer: Self-pay | Admitting: Neurology

## 2021-07-22 ENCOUNTER — Other Ambulatory Visit: Payer: Self-pay

## 2021-07-22 DIAGNOSIS — R200 Anesthesia of skin: Secondary | ICD-10-CM

## 2021-07-22 NOTE — Telephone Encounter (Signed)
Tried to call her, I'll try again later

## 2021-07-22 NOTE — Telephone Encounter (Signed)
thx

## 2021-07-22 NOTE — Progress Notes (Signed)
Office Visit Note   Patient: Judy Manning           Date of Birth: [birth year]           MRN: [id number] Visit Date: 07/08/2021 Requested by: Eunice Blase, MD [address] [place],  [place] [address] PCP: Eunice Blase, MD  Subjective: Chief Complaint  Patient presents with   Left Foot - Pain   Right Foot - Pain    HPI: Judy Manning is a 55 year old patient with bilateral foot pain numbness and tingling.  Since she was last seen she has had an EMG nerve study done by Dr. Ernestina Patches on the 30th which was normal.  Specifically no neuropathy or nerve compression identified.  She has had an MRI scan of the right heel which only showed normal Achilles tendon and mild posterior subtalar arthritis.She also had an MRI scan of the left knee at the same time in August which shows early medial and patellofemoral compartment arthritis.  These problems started after a long hospital stay for dealing with a liver cyst and polycystic liver and kidney disease.  Denies much in the way of other orthopedic complaints.              ROS: All systems reviewed are negative as they relate to the chief complaint within the history of present illness.  Patient denies  fevers or chills.   Assessment & Plan: Visit Diagnoses:  1. Bilateral foot pain   2. Low back pain, unspecified back pain laterality, unspecified chronicity, unspecified whether sciatica present     Plan: Impression is bilateral foot numbness and tingling with unclear etiology.  May be related to some of the systemic issues she has going on.  Although the nerve conduction was unremarkable I think it still possible she could have back pathology contributing to nerve compression and her symptoms.  Does not look like superficial peroneal nerve entrapment or compression due to the relatively global nature of her numbness.  Plan is MRI scan lumbar spine with possible referral to neurology if normal.At the time of this dictation she does have  only mild degenerative changes which may affect the descending left L4 nerve root.  No other spinal canal stenosis or neuroforaminal narrowing.  We will plan to refer her to neurology for further evaluation and management.  Does not look like there is a surgical problem in the lower extremities at this time.  Follow-Up Instructions: No follow-ups on file.   Orders:  Orders Placed This Encounter  Procedures   XR Foot Complete Right   XR Foot Complete Left   XR Lumbar Spine 2-3 Views   No orders of the defined types were placed in this encounter.     Procedures: No procedures performed   Clinical Data: No additional findings.  Objective: Vital Signs: There were no vitals taken for this visit.  Physical Exam:   Constitutional: Patient appears well-developed HEENT:  Head: Normocephalic Eyes:EOM are normal Neck: Normal range of motion Cardiovascular: Normal rate Pulmonary/chest: Effort normal Neurologic: Patient is alert Skin: Skin is warm Psychiatric: Patient has normal mood and affect   Ortho Exam: Ortho exam demonstrates normal gait alignment.  No effusion in either knee.  No groin pain with internal ex rotation of the leg.  Patient has bilateral palpable intact nontender anterior to posterior to peroneal and Achilles tendons.  Symmetric tibiotalar subtalar transverse tarsal range of motion without excessive tightness of the heel cord.  Does have paresthesias on the dorsal plantar aspect  of the foot.  Pedal pulses palpable.  No skin changes color differences or temperature differences between the feet.  Specialty Comments:  No specialty comments available.  Imaging: No results found.   PMFS History: Patient Active Problem List   Diagnosis Date Noted   Liver cyst 05/03/2021   Liver, polycystic with compressive symptoms 05/03/2021   SIRS (systemic inflammatory response syndrome) (HCC) 04/08/2021   Right upper quadrant pain 04/08/2021   Common bile duct dilatation  04/08/2021   History of migraine headaches 04/08/2021   Hypokalemia, inadequate intake 04/08/2021   Hyponatremia 04/08/2021   Other hyperlipidemia 06/17/2020   History of vitamin D deficiency 06/17/2020   Rheumatoid factor positive 06/17/2020   Endometrial cells on cervical Pap smear inconsistent w/LMP 09/18/2018   Fibroid 09/18/2018   Menorrhagia with irregular cycle 09/18/2018   History of basal cell carcinoma (BCC) 03/23/2018   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   Other fatigue 03/23/2018   Ganglion cyst 10/31/2017   Bilateral carpal tunnel syndrome 06/18/2017   Recurrent UTI 07/30/2013   Migraine 05/06/2013   Polycystic kidney disease 05/06/2013   Past Medical History:  Diagnosis Date   COVID 12/2020   Liver cyst 05/03/2021   Liver, polycystic with compressive symptoms 05/03/2021   Polycystic kidney disease     Family History  Problem Relation Age of Onset   Rheum arthritis Mother    Heart disease Mother        Pacemaker   COPD Mother        Smoker   Skin cancer Mother    Cancer Mother    Stroke Father    Polycystic kidney disease Father    Arthritis Brother    Alzheimer's disease Maternal Grandmother 33   Diabetes Paternal Grandfather    Skin cancer Paternal Grandfather    Cancer Paternal Grandfather    Breast cancer Neg Hx    Colon cancer Neg Hx     Past Surgical History:  Procedure Laterality Date   AUGMENTATION MAMMAPLASTY     Social History   Occupational History   Not on file  Tobacco Use   Smoking status: Never   Smokeless tobacco: Never  Substance and Sexual Activity   Alcohol use: Not Currently    Comment: rarely ever   Drug use: Never   Sexual activity: Not on file

## 2021-08-06 NOTE — Progress Notes (Signed)
[facility] Clinic Note - Initial Visit   Date: 08/09/21  Judy Manning MRN: [id number] DOB: 1966/07/23   Dear Dr.Dean:  Thank you for your kind referral of Judy Manning for consultation of bilateral feet pain. Although her history is well known to you, please allow Judy Manning to reiterate it for the purpose of our medical record. The patient was accompanied to the clinic by daughter who also provides collateral information.     History of Present Illness: Judy Manning is a 56 y.o. right-handed female with polycystic kidney and liver disease and history of bilateral CTS s/p release on the right presenting for evaluation of bilateral feet pain.   Starting in October 2022, she has numbness from the balls of her feet., which is constant. She also has sharp and shooting pain involving the random locations of the feet.  Pain is worse with walking, standing, or crossing the legs. Pain is alleviated by rest. She denies leg weakness or imbalance. No falls.  Her pain is not as severe as previously over the past two weeks. The only change is that she started taking B-complex vitamin in November.  Prior to symptoms onset, she was hospitalized in September for polycystic kidney and liver disease. She takes gabapentin 38m BID for many years for migraines.  She has visual side effects on higher dose.   She lives at home with her husband. She does not work. Nonsmoker. Does not drink alcohol.  [facility]  Out-side paper records, electronic medical record, and images have been reviewed where available and summarized as: MRI lumbar spine 07/26/2021: 1. Mild degenerative changes with a mild disc bulge that causes mild narrowing of the left lateral recess, which could affect the descending left L4 nerve. 2. Mild facet arthropathy at L4-L5. 3. No spinal canal stenosis or neural foraminal narrowing.  NCS/EMG of the legs 06/30/2021: Normal  No results found  for: HGBA1C No results found for: VITAMINB12 Lab Results  Component Value Date   TSH 1.76 06/17/2020   Lab Results  Component Value Date   ESRSEDRATE 2 10/15/2020    Past Medical History:  Diagnosis Date   COVID 12/2020   Liver cyst 05/03/2021   Liver, polycystic with compressive symptoms 05/03/2021   Polycystic kidney disease     Past Surgical History:  Procedure Laterality Date   AUGMENTATION MAMMAPLASTY       Medications:  Outpatient Encounter Medications as of 08/09/2021  Medication Sig Note   B Complex Vitamins (B COMPLEX-B12) TABS Take by mouth.    Biotin 10000 MCG TABS Take by mouth.    cetirizine (ZYRTEC) 10 MG tablet Take 10 mg by mouth daily as needed for allergies (alternates with claritin).    Cholecalciferol (VITAMIN D-3) 5000 units TABS Take by mouth.    gabapentin (NEURONTIN) 300 MG capsule TAKE 1 CAPSULE BY MOUTH  TWICE DAILY    Multiple Vitamin (MULTIVITAMIN) tablet Take 1 tablet by mouth daily.    zolpidem (AMBIEN) 10 MG tablet TAKE 1/2 TO 1 TABLET BY  MOUTH AT BEDTIME AS NEEDED  FOR SLEEP    [DISCONTINUED] predniSONE (DELTASONE) 5 MG tablet Take 10 mg by mouth 2 (two) times daily.    [DISCONTINUED] Dapsone 5 % topical gel Apply 1 application topically 2 (two) times daily. (Patient not taking: Reported on 08/09/2021)    [DISCONTINUED] guaiFENesin-dextromethorphan (ROBITUSSIN DM) 100-10 MG/5ML syrup Take 5 mLs by mouth every 4 (four) hours as needed for cough. (Patient not taking: Reported on  08/09/2021)    [DISCONTINUED] ondansetron (ZOFRAN ODT) 4 MG disintegrating tablet Take 1 tablet (4 mg total) by mouth every 8 (eight) hours as needed for nausea or vomiting. (Patient not taking: Reported on 08/09/2021)    [DISCONTINUED] oxyCODONE (OXY IR/ROXICODONE) 5 MG immediate release tablet Take 1-2 tablets (5-10 mg total) by mouth every 4 (four) hours as needed for breakthrough pain (not relieved with tramadol). (Patient not taking: Reported on 08/09/2021)    [DISCONTINUED]  tretinoin (RETIN-A) 0.025 % cream Apply topically at bedtime. (Patient not taking: Reported on 08/09/2021) 04/08/2021: Got filled but hasn't used yet   No facility-administered encounter medications on file as of 08/09/2021.    Allergies:  Allergies  Allergen Reactions   Imitrex [Sumatriptan] Swelling    Throat gets tight   Ibuprofen Other (See Comments)    Polycystic kidney disease   Nitrofurantoin Rash   Oseltamivir Nausea And Vomiting    Family History: Family History  Problem Relation Age of Onset   Rheum arthritis Mother    Heart disease Mother        Pacemaker   COPD Mother        Smoker   Skin cancer Mother    Cancer Mother    Stroke Father    Polycystic kidney disease Father    Arthritis Brother    Alzheimer's disease Maternal Grandmother 63   Diabetes Paternal Grandfather    Skin cancer Paternal Grandfather    Cancer Paternal Grandfather    Breast cancer Neg Hx    Colon cancer Neg Hx     Social History: Social History   Tobacco Use   Smoking status: Never   Smokeless tobacco: Never  Substance Use Topics   Alcohol use: Not Currently    Comment: rarely ever   Drug use: Never   Social History   Social History Narrative   Right Handed    Lives in a two story home     Vital Signs:  BP 134/73    Pulse 85    Ht 5' (1.524 m)    Wt 113 lb (51.3 kg)    SpO2 98%    BMI 22.07 kg/m   Neurological Exam: MENTAL STATUS including orientation to time, place, person, recent and remote memory, attention span and concentration, language, and fund of knowledge is normal.  Speech is not dysarthric.  CRANIAL NERVES: II:  No visual field defects. III-IV-VI: Pupils equal round and reactive to light.  Normal conjugate, extra-ocular eye movements in all directions of gaze.  No nystagmus.  No ptosis.   V:  Normal facial sensation.    VII:  Normal facial symmetry and movements.   VIII:  Normal hearing and vestibular function.   IX-X:  Normal palatal movement.   XI:  Normal  shoulder shrug and head rotation.   XII:  Normal tongue strength and range of motion, no deviation or fasciculation.  MOTOR:  No atrophy, fasciculations or abnormal movements.  No pronator drift.   Upper Extremity:  Right  Left  Deltoid  5/5   5/5   Biceps  5/5   5/5   Triceps  5/5   5/5   Infraspinatus 5/5  5/5  Medial pectoralis 5/5  5/5  Wrist extensors  5/5   5/5   Wrist flexors  5/5   5/5   Finger extensors  5/5   5/5   Finger flexors  5/5   5/5   Dorsal interossei  5/5   5/5   Abductor pollicis  5/5   5/5   Tone (Ashworth scale)  0  0   Lower Extremity:  Right  Left  Hip flexors  5/5   5/5   Hip extensors  5/5   5/5   Adductor 5/5  5/5  Abductor 5/5  5/5  Knee flexors  5/5   5/5   Knee extensors  5/5   5/5   Dorsiflexors  5/5   5/5   Plantarflexors  5/5   5/5   Toe extensors  5/5   5/5   Toe flexors  5/5   5/5   Tone (Ashworth scale)  0  0   MSRs:  Right        Left                  brachioradialis 2+  2+  biceps 2+  2+  triceps 2+  2+  patellar 2+  2+  ankle jerk 2+  2+  Hoffman no  no  plantar response down  down   SENSORY:  Vibration reducedto 70%at the ankles and 50% at the great toe. Temperature and pin prick reduced over the feet. Romberg's sign absent.   COORDINATION/GAIT: Normal finger-to- nose-finger.  Intact rapid alternating movements bilaterally.  Gait appears slow, mildly antalgic, stable,unassisted. Tandem gait intact.   IMPRESSION: Bilateral feet paresthesias and pain ?small fiber neuropathy. Prior testing includes MRI lumbar spine, which does not show nerve impingement at the level to cause feet pain. NCS/EMG of the legs is also normal, excluding large fiber neuropathy.  Additional labs and skin biopsy will be ordered to look for small fiber neuropathy.  PLAN/RECOMMENDATIONS:  Check ESR, CRP, vitamin B12, folate, TSH, SPEP with IFE, ANA Refer to podiatry for skin biopsy to evaluate for small fiber neuropathy Start duloxetine 19m at  bedtime  Further recommendations pending results.  Thank you for allowing me to participate in patient's care.  If I can answer any additional questions, I would be pleased to do so.    Sincerely,    Blaize Nipper K. PPosey Pronto DO

## 2021-08-09 ENCOUNTER — Encounter: Payer: Self-pay | Admitting: Neurology

## 2021-08-09 ENCOUNTER — Other Ambulatory Visit (INDEPENDENT_AMBULATORY_CARE_PROVIDER_SITE_OTHER): Payer: 59

## 2021-08-09 ENCOUNTER — Other Ambulatory Visit: Payer: Self-pay

## 2021-08-09 ENCOUNTER — Ambulatory Visit: Payer: 59 | Admitting: Neurology

## 2021-08-09 VITALS — BP 134/73 | HR 85 | Ht 60.0 in | Wt 113.0 lb

## 2021-08-09 DIAGNOSIS — R202 Paresthesia of skin: Secondary | ICD-10-CM

## 2021-08-09 LAB — C-REACTIVE PROTEIN: CRP: <1 mg/dL (ref 0.5–20.0)

## 2021-08-09 LAB — SEDIMENTATION RATE: Sed Rate: 19 mm/hr (ref 0–30)

## 2021-08-09 LAB — TSH: TSH: 0.93 u[IU]/mL (ref 0.35–5.50)

## 2021-08-09 LAB — B12 AND FOLATE PANEL
Folate: >24.2 ng/mL (ref >5.9)
Vitamin B-12: 376 pg/mL (ref 211–911)

## 2021-08-09 MED ORDER — DULOXETINE HCL 30 MG PO CPEP: 30.0000 mg | ORAL_CAPSULE | Freq: Every day | ORAL | 3 refills | Status: AC

## 2021-08-09 NOTE — Patient Instructions (Addendum)
Start duloxetine 30mg  at bedtime  Refer to podiatry for skin biopsy  Check labs

## 2021-08-11 ENCOUNTER — Other Ambulatory Visit: Payer: Self-pay

## 2021-08-11 ENCOUNTER — Ambulatory Visit: Payer: 59 | Admitting: Orthopedic Surgery

## 2021-08-11 DIAGNOSIS — R202 Paresthesia of skin: Secondary | ICD-10-CM | POA: Diagnosis not present

## 2021-08-11 DIAGNOSIS — R200 Anesthesia of skin: Secondary | ICD-10-CM | POA: Diagnosis not present

## 2021-08-12 ENCOUNTER — Ambulatory Visit: Payer: 59 | Admitting: Podiatry

## 2021-08-12 ENCOUNTER — Encounter: Payer: Self-pay | Admitting: Podiatry

## 2021-08-12 DIAGNOSIS — G609 Hereditary and idiopathic neuropathy, unspecified: Secondary | ICD-10-CM | POA: Diagnosis not present

## 2021-08-12 NOTE — Progress Notes (Signed)
Subjective:   Patient ID: Judy Manning, female   DOB: 56 y.o.   MRN: [id number]   HPI Patient states she was in the hospital in October and developed significant pain in her feet followed by severe numbness of both feet with patient found to have polycystic kidney and liver disease.  Has had neurological studies EMGs nerve conductions which were negative and she is very worried and is referred over for neuro biopsies.  Patient does not smoke likes to be active   Review of Systems  All other systems reviewed and are negative.      Objective:  Physical Exam Vitals and nursing note reviewed.  Constitutional:      Appearance: She is well-developed.  Pulmonary:     Effort: Pulmonary effort is normal.  Musculoskeletal:        General: Normal range of motion.  Skin:    General: Skin is warm.  Neurological:     Mental Status: She is alert.    Neurovascular status was found to be intact as far as circulatory status with moderate diminishment sharp dull vibratory.  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  I went ahead and I did a block approximate 15 cm proximal to the lateral malleolus bilateral blocked with 30 mg Xylocaine epinephrine then did nerve biopsies of these areas placing in solution to be sent for evaluation and we will see patient back 5 weeks.  Sterile dressings applied instructed that we may consider other treatments depending on whether or not we find fiber disease

## 2021-08-13 LAB — PROTEIN ELECTROPHORESIS, SERUM
Albumin ELP: 4.4 g/dL (ref 3.8–4.8)
Alpha 1: 0.3 g/dL (ref 0.2–0.3)
Alpha 2: 0.6 g/dL (ref 0.5–0.9)
Beta 2: 0.3 g/dL (ref 0.2–0.5)
Beta Globulin: 0.4 g/dL (ref 0.4–0.6)
Gamma Globulin: 1.2 g/dL (ref 0.8–1.7)
Total Protein: 7.3 g/dL (ref 6.1–8.1)

## 2021-08-13 LAB — ANA: Anti Nuclear Antibody (ANA): NEGATIVE

## 2021-08-13 LAB — IMMUNOFIXATION ELECTROPHORESIS
IgG (Immunoglobin G), Serum: 1391 mg/dL (ref 600–1640)
IgM, Serum: 97 mg/dL (ref 50–300)
Immunofix Electr Int: NOT DETECTED
Immunoglobulin A: 179 mg/dL (ref 47–310)

## 2021-08-15 ENCOUNTER — Encounter: Payer: Self-pay | Admitting: Orthopedic Surgery

## 2021-08-15 NOTE — Progress Notes (Signed)
Office Visit Note   Patient: Judy Manning           Date of Birth: [birth year]           MRN: [id number] Visit Date: 08/11/2021 Requested by: Eunice Blase, MD [address],  [place] [id number] PCP: Eunice Blase, MD  Subjective: Chief Complaint  Patient presents with   Other     Scan review    HPI: Judy Manning is a patient follows up for bilateral foot numbness and tingling.  Since she was last seen she had an MRI scan which shows mild facet arthropathy at L4-5 but no spinal canal stenosis or neuroforaminal narrowing.  Since she was last seen she has seen neurology who have done some type of open nerve biopsy.              ROS: All systems reviewed are negative as they relate to the chief complaint within the history of present illness.  Patient denies  fevers or chills.   Assessment & Plan: Visit Diagnoses:  1. Numbness and tingling of foot     Plan: Impression is normal nerve conduction study and normal MRI scan of the lumbar spine.  No immediate orthopedic or surgical problem to explain the compression which could be causing her numbness and tingling.  This looks like its more of a systemic medical type issue.  Follow-up as needed.  Follow-Up Instructions: Return if symptoms worsen or fail to improve.   Orders:  No orders of the defined types were placed in this encounter.  No orders of the defined types were placed in this encounter.     Procedures: No procedures performed   Clinical Data: No additional findings.  Objective: Vital Signs: There were no vitals taken for this visit.  Physical Exam:   Constitutional: Patient appears well-developed HEENT:  Head: Normocephalic Eyes:EOM are normal Neck: Normal range of motion Cardiovascular: Normal rate Pulmonary/chest: Effort normal Neurologic: Patient is alert Skin: Skin is warm Psychiatric: Patient has normal mood and affect   Ortho Exam: Ortho exam remains unchanged.  No nerve  root tension signs.  Good ankle dorsiflexion plantarflexion strength.  Patient has paresthesias on the dorsal and plantar aspect of the foot.  Both feet are perfused.  Specialty Comments:  No specialty comments available.  Imaging: No results found.   PMFS History: Patient Active Problem List   Diagnosis Date Noted   Liver cyst 05/03/2021   Liver, polycystic with compressive symptoms 05/03/2021   SIRS (systemic inflammatory response syndrome) (HCC) 04/08/2021   Right upper quadrant pain 04/08/2021   Common bile duct dilatation 04/08/2021   History of migraine headaches 04/08/2021   Hypokalemia, inadequate intake 04/08/2021   Hyponatremia 04/08/2021   Other hyperlipidemia 06/17/2020   History of vitamin D deficiency 06/17/2020   Rheumatoid factor positive 06/17/2020   Endometrial cells on cervical Pap smear inconsistent w/LMP 09/18/2018   Fibroid 09/18/2018   Menorrhagia with irregular cycle 09/18/2018   History of basal cell carcinoma (BCC) 03/23/2018   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   Other fatigue 03/23/2018   Ganglion cyst 10/31/2017   Bilateral carpal tunnel syndrome 06/18/2017   Recurrent UTI 07/30/2013   Migraine 05/06/2013   Polycystic kidney disease 05/06/2013   Past Medical History:  Diagnosis Date   COVID 12/2020   Liver cyst 05/03/2021   Liver, polycystic with compressive symptoms 05/03/2021   Polycystic kidney disease     Family History  Problem Relation Age of Onset  Rheum arthritis Mother    Heart disease Mother        Pacemaker   COPD Mother        Smoker   Skin cancer Mother    Cancer Mother    Stroke Father    Polycystic kidney disease Father    Arthritis Brother    Alzheimer's disease Maternal Grandmother 15   Diabetes Paternal Grandfather    Skin cancer Paternal Grandfather    Cancer Paternal Grandfather    Breast cancer Neg Hx    Colon cancer Neg Hx     Past Surgical History:  Procedure Laterality Date    AUGMENTATION MAMMAPLASTY     Social History   Occupational History   Not on file  Tobacco Use   Smoking status: Never   Smokeless tobacco: Never  Substance and Sexual Activity   Alcohol use: Not Currently    Comment: rarely ever   Drug use: Never   Sexual activity: Not on file

## 2021-08-19 ENCOUNTER — Encounter: Payer: Self-pay | Admitting: Podiatry

## 2021-08-20 ENCOUNTER — Encounter: Payer: Self-pay | Admitting: Podiatry

## 2021-08-20 NOTE — Telephone Encounter (Signed)
Sooner appointment for biopsy results.

## 2021-08-23 ENCOUNTER — Other Ambulatory Visit: Payer: Self-pay | Admitting: Family Medicine

## 2021-08-23 DIAGNOSIS — R202 Paresthesia of skin: Secondary | ICD-10-CM

## 2021-09-01 ENCOUNTER — Ambulatory Visit: Payer: 59 | Admitting: Podiatry

## 2021-09-01 ENCOUNTER — Other Ambulatory Visit: Payer: Self-pay

## 2021-09-01 DIAGNOSIS — G609 Hereditary and idiopathic neuropathy, unspecified: Secondary | ICD-10-CM | POA: Diagnosis not present

## 2021-09-01 NOTE — Progress Notes (Signed)
Subjective:   Patient ID: Judy Manning, female   DOB: 56 y.o.   MRN: [id number]   HPI Patient states that she still getting a lot of burning in both her feet and also is getting numbness like sensations.  She is taking her Cymbalta and she is doing other work-ups but she is interested in anything we can do to help her foot discomfort and numbness.  Patient does not smoke likes to be active   Review of Systems  All other systems reviewed and are negative.      Objective:  Physical Exam Vitals and nursing note reviewed.  Constitutional:      Appearance: She is well-developed.  Pulmonary:     Effort: Pulmonary effort is normal.  Musculoskeletal:        General: Normal range of motion.  Skin:    General: Skin is warm.  Neurological:     Mental Status: She is alert.    Neurovascular status is unchanged with patient found to have numbness plantar aspect both feet and also burning and shooting pain with a high arch foot structure     Assessment:  Idiopathic neuropathy that testing that we did does not indicate a small fiber disease condition     Plan:  H&P all conditions discussed at great length and the frustration she experiences.  She is seeing other physicians and I do think this is good to try to ascertain her problem and is going to see a rheumatologist also.  I have recommended Qutenza treatment to try to help with her foot condition and we will go ahead and get this approved as I do think this can make a difference for her.  Patient is encouraged to call questions concerns prior to beginning and initiation of treatment

## 2021-09-16 ENCOUNTER — Ambulatory Visit: Payer: 59 | Admitting: Podiatry

## 2021-10-01 ENCOUNTER — Ambulatory Visit: Payer: 59 | Admitting: Neurology

## 2021-10-04 ENCOUNTER — Other Ambulatory Visit: Payer: Self-pay

## 2021-10-04 ENCOUNTER — Ambulatory Visit
Admission: RE | Admit: 2021-10-04 | Discharge: 2021-10-04 | Disposition: A | Discharge status: Home / Self Care | Payer: 59 | Source: Ambulatory Visit | Attending: Family Medicine | Admitting: Family Medicine

## 2021-10-04 DIAGNOSIS — R202 Paresthesia of skin: Secondary | ICD-10-CM

## 2021-10-04 IMAGING — MR MR HEAD W/O CM
10 series · 48 of 48 positions shown · non-contrast
Comparison: None.

CLINICAL DATA: Paresthesias.  Pain and numbness in the feet.

EXAM:
MRI HEAD WITHOUT CONTRAST
TECHNIQUE: Multiplanar, multiecho pulse sequences of the brain and surrounding
structures were obtained without intravenous contrast.

[Series 2: T1 · sagittal · 5.0mm · 0.45mm/px · 2 of 22 slices shown]
[im 1/22]
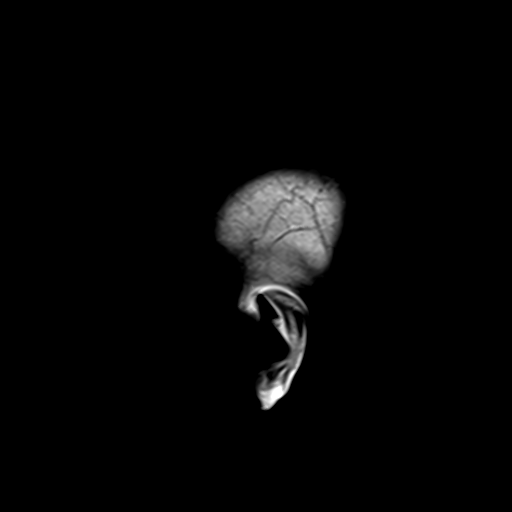
[im 22/22]
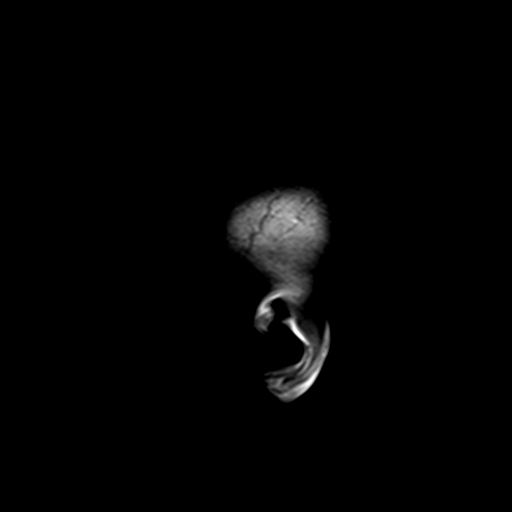

[Series 3: DWI · axial · 3.0mm · 1.80mm/px · z∈[-68,+82]mm · 9 of 102 slices shown (1 of 4)]
[im 1/102]
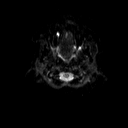
[im 13/102]
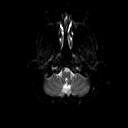
[im 26/102]
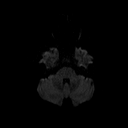
[im 38/102]
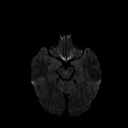
[im 51/102]
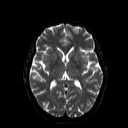
[im 64/102]
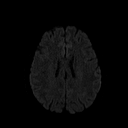
[im 76/102]
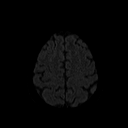
[im 89/102]
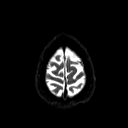
[im 102/102]
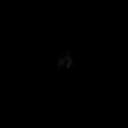

[Series 4: DWI · axial · 3.0mm · 1.80mm/px · z∈[-68,+82]mm · 4 of 50 slices shown (2 of 4)]
[im 1/50]
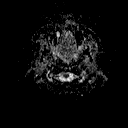
[im 17/50]
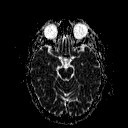
[im 33/50]
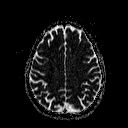
[im 50/50]
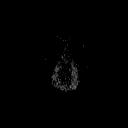

[Series 5: DWI · coronal · 5.0mm · 1.80mm/px · 6 of 71 slices shown (3 of 4)]
[im 1/71]
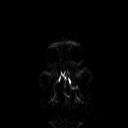
[im 15/71]
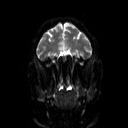
[im 29/71]
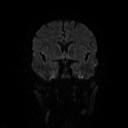
[im 43/71]
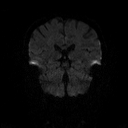
[im 57/71]
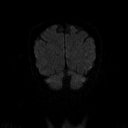
[im 71/71]
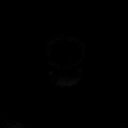

[Series 6: DWI · coronal · 5.0mm · 1.80mm/px · 3 of 36 slices shown (4 of 4)]
[im 1/36]
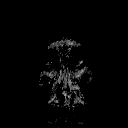
[im 18/36]
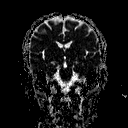
[im 36/36]
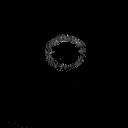

[Series 7: T2 · axial · 5.0mm · 0.72mm/px · z∈[-70,+84]mm · 2 of 23 slices shown (1 of 2)]
[im 1/23]
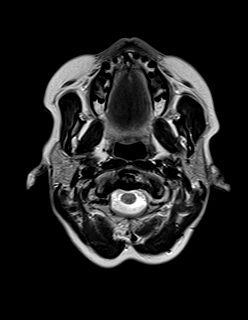
[im 23/23]
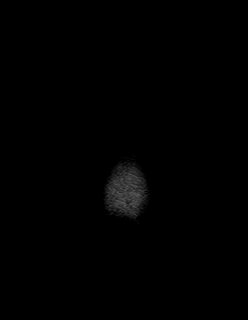

[Series 8: FLAIR · axial · 3.0mm · 0.45mm/px · z∈[-67,+81]mm · 3 of 33 slices shown]
[im 1/33]
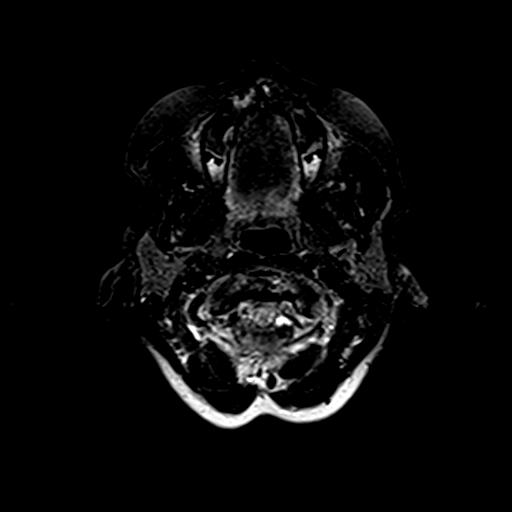
[im 17/33]
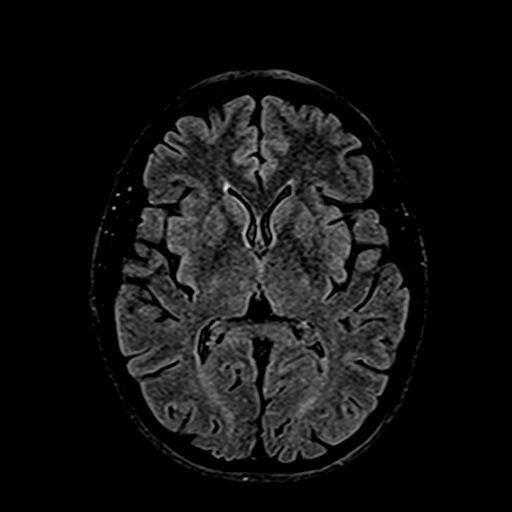
[im 33/33]
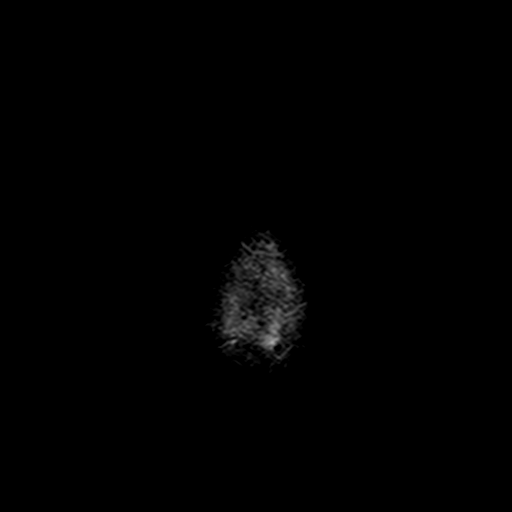

[Series 10: swi_images · axial · 4.0mm · 0.90mm/px · z∈[-71,+85]mm · 3 of 40 slices shown]
[im 1/40]
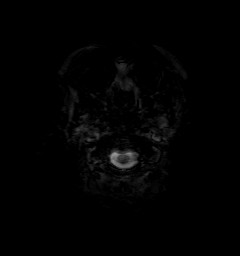
[im 20/40]
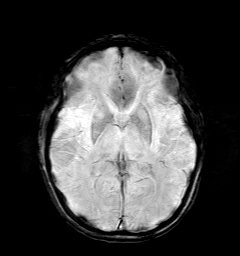
[im 40/40]
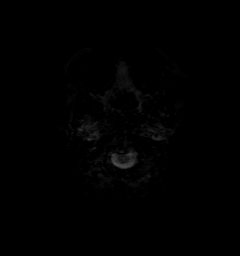

[Series 11: t1_mpr_tra · axial · 1.0mm · 0.71mm/px · z∈[-72,+87]mm · 14 of 160 slices shown]
[im 1/160]
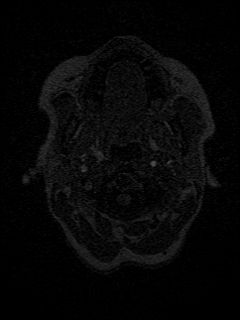
[im 13/160]
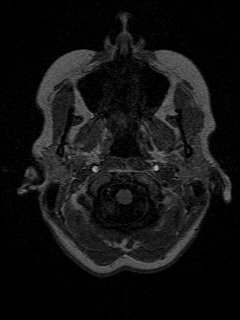
[im 25/160]
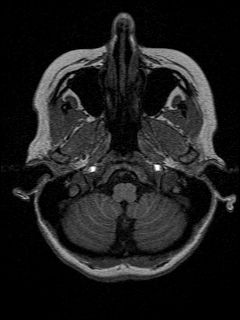
[im 37/160]
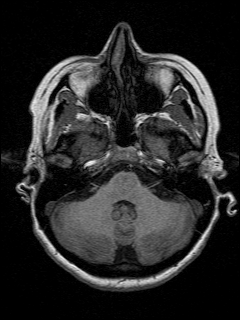
[im 49/160]
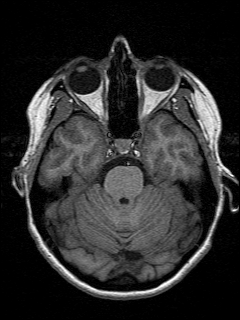
[im 62/160]
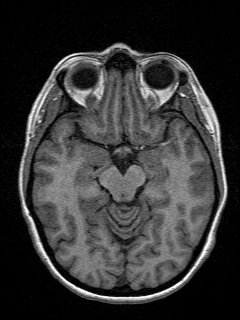
[im 74/160]
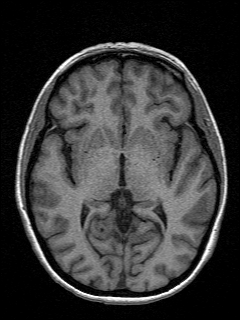
[im 86/160]
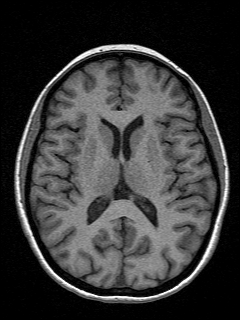
[im 98/160]
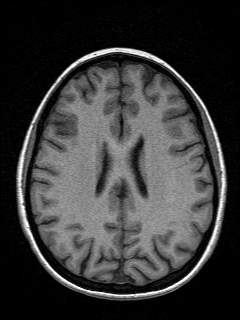
[im 111/160]
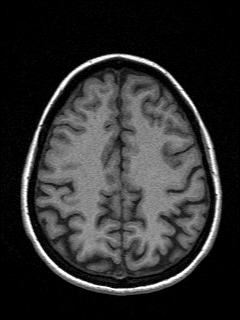
[im 123/160]
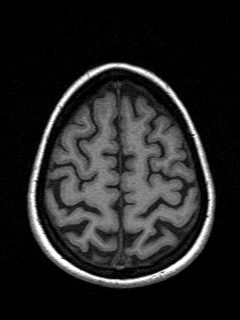
[im 135/160]
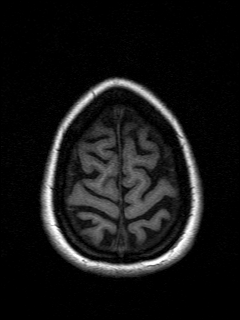
[im 147/160]
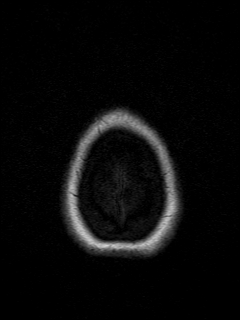
[im 160/160]
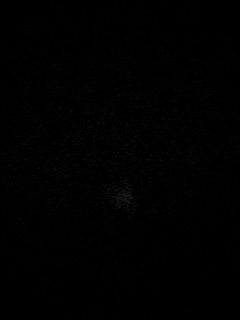

[Series 12: T2 · coronal · 5.0mm · 0.45mm/px · 2 of 28 slices shown (2 of 2)]
[im 1/28]
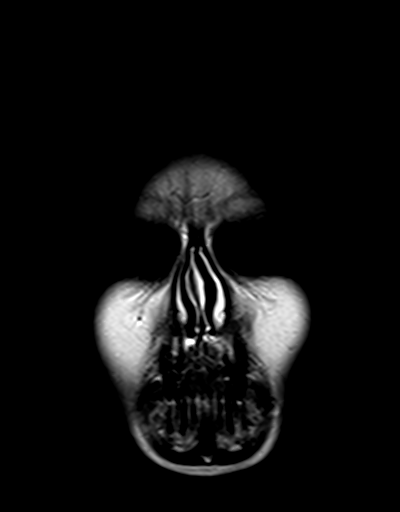
[im 28/28]
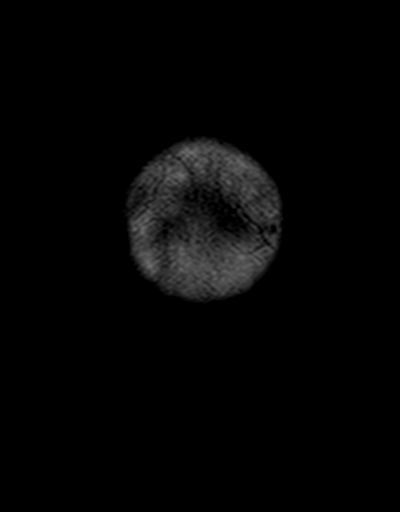

[48 of 48 positions shown; findings below may reference images not displayed]

FINDINGS: Brain: There is no evidence of an acute infarct, intracranial
hemorrhage, mass, midline shift, or extra-axial fluid collection.
The ventricles and sulci are normal. Scattered punctate T2
hyperintensities in the subcortical cerebral white matter
bilaterally are minimally advanced for age.

Vascular: Major intracranial vascular flow voids are preserved.

Skull and upper cervical spine: Unremarkable bone marrow signal.

Sinuses/Orbits: Unremarkable orbits. Clear paranasal sinuses. Trace
left mastoid fluid.

Other: None.
IMPRESSION: 1. No acute intracranial abnormality.
2. Cerebral white matter T2 hyperintensities, nonspecific though may
reflect minimal chronic small vessel ischemia, migraines, or prior
infection/inflammation.

## 2022-03-30 ENCOUNTER — Telehealth: Payer: Self-pay | Admitting: Podiatry

## 2022-03-30 NOTE — Telephone Encounter (Signed)
Called patient and she said that was a long time ago and she does not think she needs this anymore.

## 2022-04-21 NOTE — Progress Notes (Unsigned)
Office Visit Note  Patient: Judy Manning             Date of Birth: [birth year]           MRN: [id number]             PCP: Eunice Blase, MD Referring: Eunice Blase, MD Visit Date: 04/22/2022  Subjective:  New Patient (Initial Visit) (Stiff, sore hands with swelling. Prednisone tapers not working as well as before. Pins and needles in feet.)   History of Present Illness: Judy Manning is a 56 y.o. female here for evaluation for rheumatoid arthritis with chronic joint pain at multiple sites and positive rheumatoid factor. She also has a history significant for polycystic kidney disease and earlier this year was hospitalized with SIRS and findings of multiple liver cysts with one ruptured with abscess. She completed antibiotics course with resolution of flank pain and fevers and total of 45 days. She was also evaluated for lyme disease with negative serology. ***   About 4 yrs Hands and wrists Gets kne epains mostly with stairs, no swelling, medial side Initially prednisone tapers, recently on 5 5 mg steady  Sees Dr. Laban Emperor for retinal tears [place] ***  Swelling on dorsum of hands just past wrists Left 2nd finger dorsal cyst ***  Activities of Daily Living:  Patient reports morning stiffness for 30-45 minutes.   Patient Reports nocturnal pain.  Difficulty dressing/grooming: Denies Difficulty climbing stairs: Denies Difficulty getting out of chair: Denies Difficulty using hands for taps, buttons, cutlery, and/or writing: Reports  Review of Systems  Constitutional:  Positive for fatigue.  HENT:  Negative for mouth sores and mouth dryness.   Eyes:  Positive for dryness.  Respiratory:  Negative for shortness of breath.   Cardiovascular:  Negative for chest pain and palpitations.  Gastrointestinal:  Negative for blood in stool, constipation and diarrhea.  Endocrine: Positive for increased urination.  Genitourinary:  Negative for involuntary urination.   Musculoskeletal:  Positive for joint pain, joint pain, joint swelling and morning stiffness. Negative for gait problem, myalgias, muscle weakness, muscle tenderness and myalgias.  Skin:  Negative for color change, rash, hair loss and sensitivity to sunlight.  Allergic/Immunologic: Negative for susceptible to infections.  Neurological:  Negative for dizziness and headaches.  Hematological:  Negative for swollen glands.  Psychiatric/Behavioral:  Positive for sleep disturbance. Negative for depressed mood. The patient is not nervous/anxious.     PMFS History:  Patient Active Problem List   Diagnosis Date Noted   Liver cyst 05/03/2021   Liver, polycystic with compressive symptoms 05/03/2021   SIRS (systemic inflammatory response syndrome) (HCC) 04/08/2021   Right upper quadrant pain 04/08/2021   Common bile duct dilatation 04/08/2021   History of migraine headaches 04/08/2021   Hypokalemia, inadequate intake 04/08/2021   Hyponatremia 04/08/2021   Other hyperlipidemia 06/17/2020   History of vitamin D deficiency 06/17/2020   Rheumatoid factor positive 06/17/2020   Endometrial cells on cervical Pap smear inconsistent w/LMP 09/18/2018   Fibroid 09/18/2018   Menorrhagia with irregular cycle 09/18/2018   History of basal cell carcinoma (BCC) 03/23/2018   Polyarthritis with positive rheumatoid factor ([place]) 03/23/2018   Other fatigue 03/23/2018   Ganglion cyst 10/31/2017   Bilateral carpal tunnel syndrome 06/18/2017   Recurrent UTI 07/30/2013   Migraine 05/06/2013   Polycystic kidney disease 05/06/2013    Past Medical History:  Diagnosis Date   COVID 12/2020   Liver cyst 05/03/2021   Liver, polycystic with compressive symptoms 05/03/2021  Polycystic kidney disease     Family History  Problem Relation Age of Onset   Rheum arthritis Mother    Heart disease Mother        Pacemaker   COPD Mother        Smoker   Skin cancer Mother    Emphysema Mother    Heart Problems Mother     Stroke Father    Polycystic kidney disease Father    Healthy Sister    Arthritis Brother    Healthy Brother    Alzheimer's disease Maternal Grandmother 45   Diabetes Paternal Grandfather    Skin cancer Paternal Grandfather    Cancer Paternal Grandfather    Healthy Daughter    Breast cancer Neg Hx    Colon cancer Neg Hx    Past Surgical History:  Procedure Laterality Date   ABDOMINAL HERNIA REPAIR     AUGMENTATION MAMMAPLASTY     CARPAL TUNNEL RELEASE Right    GANGLION CYST EXCISION Right    RETINAL TEAR REPAIR CRYOTHERAPY Bilateral    tendonitis surgery elbow     Social History   Social History Narrative   Right Handed    Lives in a two story home    Immunization History  Administered Date(s) Administered   Influenza Inj Mdck Quad Pf 04/14/2018, 04/29/2019, 05/16/2019   Influenza, High Dose Seasonal PF 04/20/2017   Influenza, Seasonal, Injecte, Preservative Fre 09/26/2014   Influenza-Unspecified 04/29/2019   Pneumococcal Polysaccharide-23 09/26/2014   Tdap 06/16/2017     Objective: Vital Signs: BP 136/81 (BP Location: Right Arm, Patient Position: Sitting, Cuff Size: Normal)   Pulse 76   Resp 14   Ht 5' (1.524 m)   Wt 120 lb 6.4 oz (54.6 kg)   BMI 23.51 kg/m    Physical Exam   Musculoskeletal Exam: ***  CDAI Exam: CDAI Score: -- Patient Global: --; Provider Global: -- Swollen: --; Tender: -- Joint Exam 04/22/2022   No joint exam has been documented for this visit   There is currently no information documented on the homunculus. Go to the Rheumatology activity and complete the homunculus joint exam.  Investigation: No additional findings.  Imaging: No results found.  Recent Labs: Lab Results  Component Value Date   WBC 6.2 04/15/2021   HGB 8.9 (L) 04/15/2021   PLT 468 (H) 04/15/2021   NA 140 05/03/2021   K 4.3 05/03/2021   CL 104 05/03/2021   CO2 27 05/03/2021   GLUCOSE 102 (H) 05/03/2021   BUN 14 05/03/2021   CREATININE 1.31 (H) 05/03/2021    BILITOT 0.5 04/12/2021   ALKPHOS 94 04/12/2021   AST 40 04/12/2021   ALT 47 (H) 04/12/2021   PROT 7.3 08/09/2021   ALBUMIN 2.2 (L) 04/12/2021   CALCIUM 9.3 05/03/2021   GFRAA  08/31/2009    >60        The eGFR has been calculated using the MDRD equation. This calculation has not been validated in all clinical situations. eGFR's persistently <60 mL/min signify possible Chronic Kidney Disease.    Speciality Comments: No specialty comments available.  Procedures:  No procedures performed Allergies: Imitrex [sumatriptan], Ibuprofen, Nitrofurantoin, and Oseltamivir   Assessment / Plan:     Visit Diagnoses: Polyarthritis with positive rheumatoid factor ([place]) - Plan: hydroxychloroquine (PLAQUENIL) 200 MG tablet, Sedimentation rate, C-reactive protein, Rheumatoid factor  Polycystic kidney disease  History of vitamin D deficiency  Liver cyst  Bilateral carpal tunnel syndrome  Orders: Orders Placed This Encounter  Procedures  Sedimentation rate   C-reactive protein   Rheumatoid factor   Meds ordered this encounter  Medications   hydroxychloroquine (PLAQUENIL) 200 MG tablet    Sig: Take 1 tablet (200 mg total) by mouth daily.    Dispense:  30 tablet    Refill:  2    Face-to-face time spent with patient was *** minutes. Greater than 50% of time was spent in counseling and coordination of care.  Follow-Up Instructions: Return in about 2 months (around 06/22/2022) for New pt RA HCQ start f/u ~56mo.   CCollier Salina MD  Note - This record has been created using [employer]  Chart creation errors have been sought, but may not always  have been located. Such creation errors do not reflect on  the standard of medical care.

## 2022-04-22 ENCOUNTER — Encounter: Payer: Self-pay | Admitting: Internal Medicine

## 2022-04-22 ENCOUNTER — Ambulatory Visit: Payer: 59 | Attending: Internal Medicine | Admitting: Internal Medicine

## 2022-04-22 VITALS — BP 136/81 | HR 76 | Resp 14 | Ht 60.0 in | Wt 120.4 lb

## 2022-04-22 DIAGNOSIS — Z8639 Personal history of other endocrine, nutritional and metabolic disease: Secondary | ICD-10-CM | POA: Diagnosis not present

## 2022-04-22 DIAGNOSIS — K7689 Other specified diseases of liver: Secondary | ICD-10-CM | POA: Diagnosis not present

## 2022-04-22 DIAGNOSIS — G5603 Carpal tunnel syndrome, bilateral upper limbs: Secondary | ICD-10-CM

## 2022-04-22 DIAGNOSIS — M0580 Other rheumatoid arthritis with rheumatoid factor of unspecified site: Secondary | ICD-10-CM | POA: Diagnosis not present

## 2022-04-22 DIAGNOSIS — Q613 Polycystic kidney, unspecified: Secondary | ICD-10-CM | POA: Diagnosis not present

## 2022-04-22 MED ORDER — HYDROXYCHLOROQUINE SULFATE 200 MG PO TABS: 200.0000 mg | ORAL_TABLET | Freq: Every day | ORAL | 2 refills | Status: DC

## 2022-04-23 LAB — RHEUMATOID FACTOR: Rheumatoid fact SerPl-aCnc: 15 IU/mL — ABNORMAL HIGH (ref <14)

## 2022-04-23 LAB — C-REACTIVE PROTEIN: CRP: 1.1 mg/L (ref <8.0)

## 2022-04-23 LAB — SEDIMENTATION RATE: Sed Rate: 6 mm/h (ref 0–30)

## 2022-04-26 NOTE — Progress Notes (Signed)
Lab result still show low positive rheumatoid factor, other markers for inflammation currently normal.

## 2022-06-27 NOTE — Progress Notes (Signed)
Office Visit Note  Patient: Judy Manning             Date of Birth: [birth year]           MRN: [id number]             PCP: Eunice Blase, MD Referring: Eunice Blase, MD Visit Date: 06/28/2022   Subjective:  Follow-up (Doing good)   History of Present Illness: Judy Manning is a 56 y.o. female here for follow up for seropositive RA after starting HCQ 200 mg daily also continued on prednisone 5 mg daily. She is feeling very well on this regimen. Decreased hand swelling and morning stiffness. She is still concerned about progression of lateral deviation in her great toes and discoloration of the toenails.   Previous HPI 04/22/22 Judy Manning is a 56 y.o. female here for evaluation for rheumatoid arthritis with chronic joint pain at multiple sites and positive rheumatoid factor.  She reports joint pains more consistently for about 4 years duration.  Mostly affecting her hands and wrists on both sides.  She experiences intermittent pain and swelling especially on the fingers and backs of the hands.  More longstanding problem with knee pains most commonly provoked with climbing stairs.  Initially treatment with intermittent short prednisone tapers would resolve symptoms and last for a while between doses.  However more recently symptoms are returning and of her off medication so has been on 5 mg prednisone for management with PCP. She was also evaluated for lyme disease with negative serology. She also has a history significant for polycystic kidney disease and earlier this year was hospitalized with SIRS and findings of multiple liver cysts with one ruptured with abscess. She completed antibiotics course with resolution of flank pain and fevers and total of 45 days.  She sees Dr. Laban Emperor at [facility] for ophthalmology with history of retinal tears. She has a strong family history of rheumatoid arthritis.   Review of Systems  Constitutional:  Positive for fatigue.  HENT:   Negative for mouth sores and mouth dryness.   Eyes:  Positive for dryness.  Respiratory:  Negative for shortness of breath.   Cardiovascular:  Negative for chest pain and palpitations.  Gastrointestinal:  Positive for diarrhea. Negative for blood in stool and constipation.  Endocrine: Negative for increased urination.  Genitourinary:  Negative for involuntary urination.  Musculoskeletal:  Positive for joint pain, joint pain, joint swelling and morning stiffness. Negative for gait problem, myalgias, muscle weakness, muscle tenderness and myalgias.  Skin:  Negative for color change, rash, hair loss and sensitivity to sunlight.  Allergic/Immunologic: Negative for susceptible to infections.  Neurological:  Positive for headaches. Negative for dizziness.  Hematological:  Negative for swollen glands.  Psychiatric/Behavioral:  Positive for sleep disturbance. Negative for depressed mood. The patient is not nervous/anxious.     PMFS History:  Patient Active Problem List   Diagnosis Date Noted   Toenail deformity 06/28/2022   Liver cyst 05/03/2021   Liver, polycystic with compressive symptoms 05/03/2021   SIRS (systemic inflammatory response syndrome) (HCC) 04/08/2021   Right upper quadrant pain 04/08/2021   Common bile duct dilatation 04/08/2021   History of migraine headaches 04/08/2021   Hypokalemia, inadequate intake 04/08/2021   Hyponatremia 04/08/2021   Other hyperlipidemia 06/17/2020   History of vitamin D deficiency 06/17/2020   Endometrial cells on cervical Pap smear inconsistent w/LMP 09/18/2018   Fibroid 09/18/2018   Menorrhagia with irregular cycle 09/18/2018   History of basal cell carcinoma (BCC)  03/23/2018   Seropositive rheumatoid arthritis ([place]) 03/23/2018   Other fatigue 03/23/2018   Ganglion cyst 10/31/2017   Bilateral carpal tunnel syndrome 06/18/2017   Recurrent UTI 07/30/2013   Migraine 05/06/2013   Polycystic kidney disease 05/06/2013    Past Medical History:   Diagnosis Date   COVID 12/2020   Liver cyst 05/03/2021   Liver, polycystic with compressive symptoms 05/03/2021   Polycystic kidney disease     Family History  Problem Relation Age of Onset   Rheum arthritis Mother    Heart disease Mother        Pacemaker   COPD Mother        Smoker   Skin cancer Mother    Emphysema Mother    Heart Problems Mother    Stroke Father    Polycystic kidney disease Father    Healthy Sister    Arthritis Brother    Healthy Brother    Alzheimer's disease Maternal Grandmother 93   Diabetes Paternal Grandfather    Skin cancer Paternal Grandfather    Cancer Paternal Grandfather    Healthy Daughter    Breast cancer Neg Hx    Colon cancer Neg Hx    Past Surgical History:  Procedure Laterality Date   ABDOMINAL HERNIA REPAIR     AUGMENTATION MAMMAPLASTY     CARPAL TUNNEL RELEASE Right    GANGLION CYST EXCISION Right    RETINAL TEAR REPAIR CRYOTHERAPY Bilateral    tendonitis surgery elbow     Social History   Social History Narrative   Right Handed    Lives in a two story home    Immunization History  Administered Date(s) Administered   Influenza Inj Mdck Quad Pf 04/14/2018, 04/29/2019, 05/16/2019   Influenza, High Dose Seasonal PF 04/20/2017   Influenza, Seasonal, Injecte, Preservative Fre 09/26/2014   Influenza-Unspecified 04/29/2019   Pneumococcal Polysaccharide-23 09/26/2014   Tdap 06/16/2017     Objective: Vital Signs: BP 126/83 (BP Location: Left Arm, Patient Position: Sitting, Cuff Size: Normal)   Pulse 86   Resp 14   Ht 5' (1.524 m)   Wt 118 lb (53.5 kg)   BMI 23.05 kg/m    Physical Exam Cardiovascular:     Rate and Rhythm: Normal rate and regular rhythm.  Pulmonary:     Effort: Pulmonary effort is normal.     Breath sounds: Normal breath sounds.  Musculoskeletal:     Right lower leg: No edema.     Left lower leg: No edema.  Skin:    General: Skin is warm and dry.     Findings: No rash.  Psychiatric:        Mood and  Affect: Mood normal.       Musculoskeletal Exam:  Shoulders full ROM no tenderness or swelling Elbows full ROM no tenderness or swelling Wrists full ROM mild swelling palpable more on dorsal side without tenderness Fingers full ROM dorsal nodule on the left 2nd finger, no palpable synovitis Knees full ROM no tenderness or swelling Ankles full ROM no tenderness or swelling  CDAI Exam: CDAI Score: 4  Patient Global: 10 mm; Provider Global: 10 mm Swollen: 2 ; Tender: 0  Joint Exam 06/28/2022      Right  Left  Wrist  Swollen   Swollen      Investigation: No additional findings.  Imaging: US RENAL  Result Date: 07/07/2022 CLINICAL DATA:  Polycystic kidney disease. EXAM: RENAL / URINARY TRACT ULTRASOUND COMPLETE COMPARISON:  MRI of the abdomen April 09, 2021 FINDINGS: Right Kidney: Renal measurements: 17.5 x 7.3 x 7.9 cm = volume: 529 mL. Innumerable simple and complicated cysts are seen on the right kidney. The 2 largest cysts measure 6.1 x 4.5 x 5.8 cm in the mid polar region of the kidney, and 3.4 x 3.3 x 3.7 cm in the lower pole of the right kidney. Left Kidney: Renal measurements: 17.7 x 9.7 x 11.7 cm = volume: 1,051 mL. Innumerable simple and complicated cysts are seen on the left kidney. The 2 largest cysts are seen in the lower pole of the left kidney measuring 5.7 x 5.1 x 4.2 cm, and in the mid polar region measuring 5.1 x 3.5 x 6.6 cm. Bladder: Appears normal for degree of bladder distention. Other: None. IMPRESSION: Difficult exam given the large size of the kidneys, which exceed the length of the ultrasound probe. Innumerable bilateral simple and complicated cysts. Electronically Signed   By: Fidela Salisbury M.D.   On: 07/07/2022 16:23    Recent Labs: Lab Results  Component Value Date   WBC 4.8 06/28/2022   HGB 13.2 06/28/2022   PLT 247 06/28/2022   NA 143 06/28/2022   K 3.7 06/28/2022   CL 102 06/28/2022   CO2 31 06/28/2022   GLUCOSE 80 06/28/2022   BUN 17  06/28/2022   CREATININE 1.69 (H) 06/28/2022   BILITOT 0.5 04/12/2021   ALKPHOS 94 04/12/2021   AST 40 04/12/2021   ALT 47 (H) 04/12/2021   PROT 7.3 08/09/2021   ALBUMIN 2.2 (L) 04/12/2021   CALCIUM 9.8 06/28/2022   GFRAA  08/31/2009    >60        The eGFR has been calculated using the MDRD equation. This calculation has not been validated in all clinical situations. eGFR's persistently <60 mL/min signify possible Chronic Kidney Disease.    Speciality Comments: No specialty comments available.  Procedures:  No procedures performed Allergies: Imitrex [sumatriptan], Ibuprofen, Nitrofurantoin, and Oseltamivir   Assessment / Plan:     Visit Diagnoses: Seropositive rheumatoid arthritis ([place]) - Plan: CBC with Differential/Platelet, BASIC METABOLIC PANEL WITH GFR, C-reactive protein, predniSONE (DELTASONE) 5 MG tablet  Symptoms appear to be doing well will check CRP for disease activity monitoring. Will continue HCQ 200 mg daily. If inflammatory markers doing well could try challenging prednisone dose taper to 2.5 mg daily if tolerable.  Toenail deformity  Lateral toe deviation is related to bunions and degenerative changes. Not sure about the toenail abnormality this is not a typical symptom for RA, and if it were related would expect improvement with symptoms better systemically.    Orders: Orders Placed This Encounter  Procedures   CBC with Differential/Platelet   BASIC METABOLIC PANEL WITH GFR   C-reactive protein   Meds ordered this encounter  Medications   hydroxychloroquine (PLAQUENIL) 200 MG tablet    Sig: Take 1 tablet (200 mg total) by mouth daily.    Dispense:  90 tablet    Refill:  0   predniSONE (DELTASONE) 5 MG tablet    Sig: Take 1 tablet (5 mg total) by mouth daily with breakfast.    Dispense:  90 tablet    Refill:  0     Follow-Up Instructions: Return in about 3 months (around 09/28/2022) for RA on HCQ/GC f/u 70mo.   CCollier Salina MD  Note  - This record has been created using [employer]  Chart creation errors have been sought, but may not always  have been located. Such creation errors  do not reflect on  the standard of medical care.

## 2022-06-28 ENCOUNTER — Encounter: Payer: Self-pay | Admitting: Internal Medicine

## 2022-06-28 ENCOUNTER — Ambulatory Visit: Payer: 59 | Attending: Internal Medicine | Admitting: Internal Medicine

## 2022-06-28 VITALS — BP 126/83 | HR 86 | Resp 14 | Ht 60.0 in | Wt 118.0 lb

## 2022-06-28 DIAGNOSIS — M0580 Other rheumatoid arthritis with rheumatoid factor of unspecified site: Secondary | ICD-10-CM | POA: Diagnosis not present

## 2022-06-28 DIAGNOSIS — L608 Other nail disorders: Secondary | ICD-10-CM | POA: Diagnosis not present

## 2022-06-28 DIAGNOSIS — Q613 Polycystic kidney, unspecified: Secondary | ICD-10-CM

## 2022-06-28 DIAGNOSIS — M059 Rheumatoid arthritis with rheumatoid factor, unspecified: Secondary | ICD-10-CM | POA: Diagnosis not present

## 2022-06-28 MED ORDER — HYDROXYCHLOROQUINE SULFATE 200 MG PO TABS: 200.0000 mg | ORAL_TABLET | Freq: Every day | ORAL | 0 refills | Status: DC

## 2022-06-29 LAB — CBC WITH DIFFERENTIAL/PLATELET
Absolute Monocytes: 360 cells/uL (ref 200–950)
Basophils Absolute: 62 cells/uL (ref 0–200)
Basophils Relative: 1.3 %
Eosinophils Absolute: 158 cells/uL (ref 15–500)
Eosinophils Relative: 3.3 %
HCT: 39.4 % (ref 35.0–45.0)
Hemoglobin: 13.2 g/dL (ref 11.7–15.5)
Lymphs Abs: 1214 cells/uL (ref 850–3900)
MCH: 26.6 pg — ABNORMAL LOW (ref 27.0–33.0)
MCHC: 33.5 g/dL (ref 32.0–36.0)
MCV: 79.3 fL — ABNORMAL LOW (ref 80.0–100.0)
MPV: 10.7 fL (ref 7.5–12.5)
Monocytes Relative: 7.5 %
Neutro Abs: 3005 cells/uL (ref 1500–7800)
Neutrophils Relative %: 62.6 %
Platelets: 247 10*3/uL (ref 140–400)
RBC: 4.97 10*6/uL (ref 3.80–5.10)
RDW: 14.5 % (ref 11.0–15.0)
Total Lymphocyte: 25.3 %
WBC: 4.8 10*3/uL (ref 3.8–10.8)

## 2022-06-29 LAB — BASIC METABOLIC PANEL WITH GFR
BUN/Creatinine Ratio: 10 (calc) (ref 6–22)
BUN: 17 mg/dL (ref 7–25)
CO2: 31 mmol/L (ref 20–32)
Calcium: 9.8 mg/dL (ref 8.6–10.4)
Chloride: 102 mmol/L (ref 98–110)
Creat: 1.69 mg/dL — ABNORMAL HIGH (ref 0.50–1.03)
Glucose, Bld: 80 mg/dL (ref 65–139)
Potassium: 3.7 mmol/L (ref 3.5–5.3)
Sodium: 143 mmol/L (ref 135–146)
eGFR: 36 mL/min/{1.73_m2} — ABNORMAL LOW (ref >=60)

## 2022-06-29 LAB — C-REACTIVE PROTEIN: CRP: 0.4 mg/L (ref <8.0)

## 2022-06-29 MED ORDER — PREDNISONE 5 MG PO TABS: 5.0000 mg | ORAL_TABLET | Freq: Every day | ORAL | 0 refills | Status: DC

## 2022-06-29 NOTE — Progress Notes (Signed)
Lab results look fine for continuing hydroxychloroquine as planned. Kidney function numbers are slightly worse compared to a year ago but this is not a usual side effect we would see with our current medication. I think it would be a good idea to try decreasing the prednisone dose by half to 2.5 mg daily. If arthritis symptoms get worse we can go back up to the current '5mg'$ . I will send a new prescription for the 5 mg tablets.

## 2022-07-04 ENCOUNTER — Other Ambulatory Visit: Payer: Self-pay | Admitting: Family Medicine

## 2022-07-04 DIAGNOSIS — Q613 Polycystic kidney, unspecified: Secondary | ICD-10-CM

## 2022-07-05 ENCOUNTER — Encounter: Payer: 59 | Admitting: Internal Medicine

## 2022-07-06 ENCOUNTER — Ambulatory Visit
Admission: RE | Admit: 2022-07-06 | Discharge: 2022-07-06 | Disposition: A | Discharge status: Home / Self Care | Payer: 59 | Source: Ambulatory Visit | Attending: Family Medicine | Admitting: Family Medicine

## 2022-07-06 DIAGNOSIS — Q613 Polycystic kidney, unspecified: Secondary | ICD-10-CM

## 2022-10-02 NOTE — Progress Notes (Unsigned)
Office Visit Note  Patient: Judy Manning             Date of Birth: 12/03/1965           MRN: [id number]             PCP: Eunice Blase, MD Referring: Eunice Blase, MD Visit Date: 10/03/2022   Subjective:  No chief complaint on file.   History of Present Illness: Judy Manning is a 57 y.o. female here for follow up ***   Previous HPI 06/28/22 Judy Manning is a 57 y.o. female here for follow up for seropositive RA after starting HCQ 200 mg daily also continued on prednisone 5 mg daily. She is feeling very well on this regimen. Decreased hand swelling and morning stiffness. She is still concerned about progression of lateral deviation in her great toes and discoloration of the toenails.     Previous HPI 04/22/22 Judy Manning is a 57 y.o. female here for evaluation for rheumatoid arthritis with chronic joint pain at multiple sites and positive rheumatoid factor.  She reports joint pains more consistently for about 4 years duration.  Mostly affecting her hands and wrists on both sides.  She experiences intermittent pain and swelling especially on the fingers and backs of the hands.  More longstanding problem with knee pains most commonly provoked with climbing stairs.  Initially treatment with intermittent short prednisone tapers would resolve symptoms and last for a while between doses.  However more recently symptoms are returning and of her off medication so has been on 5 mg prednisone for management with PCP. She was also evaluated for lyme disease with negative serology. She also has a history significant for polycystic kidney disease and earlier this year was hospitalized with SIRS and findings of multiple liver cysts with one ruptured with abscess. She completed antibiotics course with resolution of flank pain and fevers and total of 45 days.  She sees Dr. Laban Emperor at [facility] for ophthalmology with history of retinal tears. She has a strong family history of  rheumatoid arthritis.   No Rheumatology ROS completed.   PMFS History:  Patient Active Problem List   Diagnosis Date Noted   Toenail deformity 06/28/2022   Liver cyst 05/03/2021   Liver, polycystic with compressive symptoms 05/03/2021   SIRS (systemic inflammatory response syndrome) ([place]) 04/08/2021   Right upper quadrant pain 04/08/2021   Common bile duct dilatation 04/08/2021   History of migraine headaches 04/08/2021   Hypokalemia, inadequate intake 04/08/2021   Hyponatremia 04/08/2021   Other hyperlipidemia 06/17/2020   History of vitamin D deficiency 06/17/2020   Endometrial cells on cervical Pap smear inconsistent w/LMP 09/18/2018   Fibroid 09/18/2018   Menorrhagia with irregular cycle 09/18/2018   History of basal cell carcinoma (BCC) 03/23/2018   Seropositive rheumatoid arthritis ([place]) 03/23/2018   Other fatigue 03/23/2018   Ganglion cyst 10/31/2017   Bilateral carpal tunnel syndrome 06/18/2017   Recurrent UTI 07/30/2013   Migraine 05/06/2013   Polycystic kidney disease 05/06/2013    Past Medical History:  Diagnosis Date   COVID 12/2020   Liver cyst 05/03/2021   Liver, polycystic with compressive symptoms 05/03/2021   Polycystic kidney disease     Family History  Problem Relation Age of Onset   Rheum arthritis Mother    Heart disease Mother        Pacemaker   COPD Mother        Smoker   Skin cancer Mother    Emphysema Mother  Heart Problems Mother    Stroke Father    Polycystic kidney disease Father    Healthy Sister    Arthritis Brother    Healthy Brother    Alzheimer's disease Maternal Grandmother 48   Diabetes Paternal Grandfather    Skin cancer Paternal Grandfather    Cancer Paternal Grandfather    Healthy Daughter    Breast cancer Neg Hx    Colon cancer Neg Hx    Past Surgical History:  Procedure Laterality Date   ABDOMINAL HERNIA REPAIR     AUGMENTATION MAMMAPLASTY     CARPAL TUNNEL RELEASE Right    GANGLION CYST EXCISION Right     RETINAL TEAR REPAIR CRYOTHERAPY Bilateral    tendonitis surgery elbow     Social History   Social History Narrative   Right Handed    Lives in a two story home    Immunization History  Administered Date(s) Administered   Influenza Inj Mdck Quad Pf 04/14/2018, 04/29/2019, 05/16/2019   Influenza, High Dose Seasonal PF 04/20/2017   Influenza, Seasonal, Injecte, Preservative Fre 09/26/2014   Influenza-Unspecified 04/29/2019   Pneumococcal Polysaccharide-23 09/26/2014   Tdap 06/16/2017     Objective: Vital Signs: There were no vitals taken for this visit.   Physical Exam   Musculoskeletal Exam: ***  CDAI Exam: CDAI Score: -- Patient Global: --; Provider Global: -- Swollen: --; Tender: -- Joint Exam 10/03/2022   No joint exam has been documented for this visit   There is currently no information documented on the homunculus. Go to the Rheumatology activity and complete the homunculus joint exam.  Investigation: No additional findings.  Imaging: No results found.  Recent Labs: Lab Results  Component Value Date   WBC 4.8 06/28/2022   HGB 13.2 06/28/2022   PLT 247 06/28/2022   NA 143 06/28/2022   K 3.7 06/28/2022   CL 102 06/28/2022   CO2 31 06/28/2022   GLUCOSE 80 06/28/2022   BUN 17 06/28/2022   CREATININE 1.69 (H) 06/28/2022   BILITOT 0.5 04/12/2021   ALKPHOS 94 04/12/2021   AST 40 04/12/2021   ALT 47 (H) 04/12/2021   PROT 7.3 08/09/2021   ALBUMIN 2.2 (L) 04/12/2021   CALCIUM 9.8 06/28/2022   GFRAA  08/31/2009    >60        The eGFR has been calculated using the MDRD equation. This calculation has not been validated in all clinical situations. eGFR's persistently <60 mL/min signify possible Chronic Kidney Disease.    Speciality Comments: No specialty comments available.  Procedures:  No procedures performed Allergies: Imitrex [sumatriptan], Ibuprofen, Nitrofurantoin, and Oseltamivir   Assessment / Plan:     Visit Diagnoses: No diagnosis  found.  ***  Orders: No orders of the defined types were placed in this encounter.  No orders of the defined types were placed in this encounter.    Follow-Up Instructions: No follow-ups on file.   Collier Salina, MD  Note - This record has been created using [employer].  Chart creation errors have been sought, but may not always  have been located. Such creation errors do not reflect on  the standard of medical care.

## 2022-10-03 ENCOUNTER — Encounter: Payer: Self-pay | Admitting: Internal Medicine

## 2022-10-03 ENCOUNTER — Ambulatory Visit: Payer: 59 | Attending: Internal Medicine | Admitting: Internal Medicine

## 2022-10-03 VITALS — BP 128/78 | HR 114 | Resp 18 | Ht 60.0 in | Wt 121.6 lb

## 2022-10-03 DIAGNOSIS — Q613 Polycystic kidney, unspecified: Secondary | ICD-10-CM | POA: Diagnosis not present

## 2022-10-03 DIAGNOSIS — Q446 Cystic disease of liver: Secondary | ICD-10-CM

## 2022-10-03 DIAGNOSIS — M0580 Other rheumatoid arthritis with rheumatoid factor of unspecified site: Secondary | ICD-10-CM | POA: Diagnosis not present

## 2022-10-03 DIAGNOSIS — M059 Rheumatoid arthritis with rheumatoid factor, unspecified: Secondary | ICD-10-CM

## 2022-10-03 MED ORDER — HYDROXYCHLOROQUINE SULFATE 200 MG PO TABS: 200.0000 mg | ORAL_TABLET | Freq: Every day | ORAL | 1 refills | Status: AC

## 2023-01-03 ENCOUNTER — Encounter: Payer: Self-pay | Admitting: Internal Medicine

## 2023-01-03 ENCOUNTER — Ambulatory Visit: Payer: 59 | Attending: Internal Medicine | Admitting: Internal Medicine

## 2023-01-03 VITALS — BP 124/78 | HR 80 | Resp 14 | Ht 60.0 in | Wt 120.0 lb

## 2023-01-03 DIAGNOSIS — Q613 Polycystic kidney, unspecified: Secondary | ICD-10-CM

## 2023-01-03 DIAGNOSIS — M059 Rheumatoid arthritis with rheumatoid factor, unspecified: Secondary | ICD-10-CM | POA: Diagnosis not present

## 2023-01-03 MED ORDER — PREDNISONE 1 MG PO TABS
ORAL_TABLET | ORAL | 0 refills | Status: DC
Start: 2023-01-03 — End: 2023-03-01

## 2023-01-03 NOTE — Progress Notes (Addendum)
Office Visit Note  Patient: Judy Manning             Date of Birth: November 03, 1965           MRN: [id number]             PCP: Lavada Mesi, MD Referring: Lavada Mesi, MD Visit Date: 01/03/2023   Subjective:  Follow-up (Doing good)   History of Present Illness: Judy Manning is a 57 y.o. female here for follow up for seropositive RA on hydroxychloroquine 200 mg daily and gradual prednisone taper currently at 2 mg daily.  Overall symptoms are still doing pretty well but she has noticed some increase in joint pain and a few areas especially since decreasing the prednisone to less than 3 mg.  Fingers of both hands, medial border of left knee, and back of the right ankle have some stiffness but no associated swelling or nodules.  She followed up with Dr. Mina Marble in March for verruca on the index finger that was recurring did not follow-up about pursuing surgical removal due to him retiring.  Still notices the issues with vivid dreaming and has some easy bruising recently with a large area on the right shin.  Previous HPI 10/03/22 Judy Manning is a 57 y.o. female here for follow up for seropositive RA on HCQ 200 mg daily and prednisone 5 mg daily. She tried tapering prednisone down to 2.5 mg after last visit but developed swelling in her left hand and wrist within a few days. Since increasing the dose again symptoms improved. Dr. Prince Rome prescribed 1 mg tablets recommending to try a more gradual tapering but she has not tried this yet. She caught COVID since last visit probably from her husband and took ivermectin for this and symptoms started improving within 2-3 days without major complication.   Previous HPI 06/28/22 Judy Manning is a 57 y.o. female here for follow up for seropositive RA after starting HCQ 200 mg daily also continued on prednisone 5 mg daily. She is feeling very well on this regimen. Decreased hand swelling and morning stiffness. She is still concerned about  progression of lateral deviation in her great toes and discoloration of the toenails.     Previous HPI 04/22/22 Judy Manning is a 57 y.o. female here for evaluation for rheumatoid arthritis with chronic joint pain at multiple sites and positive rheumatoid factor.  She reports joint pains more consistently for about 4 years duration.  Mostly affecting her hands and wrists on both sides.  She experiences intermittent pain and swelling especially on the fingers and backs of the hands.  More longstanding problem with knee pains most commonly provoked with climbing stairs.  Initially treatment with intermittent short prednisone tapers would resolve symptoms and last for a while between doses.  However more recently symptoms are returning and of her off medication so has been on 5 mg prednisone for management with PCP. She was also evaluated for lyme disease with negative serology. She also has a history significant for polycystic kidney disease and earlier this year was hospitalized with SIRS and findings of multiple liver cysts with one ruptured with abscess. She completed antibiotics course with resolution of flank pain and fevers and total of 45 days.  She sees Dr. Martha Clan at [facility] for ophthalmology with history of retinal tears. She has a strong family history of rheumatoid arthritis.   Review of Systems  Constitutional:  Positive for fatigue.  HENT:  Negative for mouth sores and mouth dryness.  Eyes:  Positive for dryness.  Respiratory:  Negative for shortness of breath.   Cardiovascular:  Negative for chest pain and palpitations.  Gastrointestinal:  Positive for diarrhea. Negative for blood in stool and constipation.  Endocrine: Negative for increased urination.  Genitourinary:  Negative for involuntary urination.  Musculoskeletal:  Positive for joint pain, joint pain, joint swelling and morning stiffness. Negative for gait problem, myalgias, muscle weakness, muscle tenderness and  myalgias.  Skin:  Negative for color change, rash, hair loss and sensitivity to sunlight.  Allergic/Immunologic: Negative for susceptible to infections.  Neurological:  Positive for headaches. Negative for dizziness.  Hematological:  Negative for swollen glands.  Psychiatric/Behavioral:  Positive for sleep disturbance. Negative for depressed mood. The patient is not nervous/anxious.     PMFS History:  Patient Active Problem List   Diagnosis Date Noted   Toenail deformity 06/28/2022   Liver cyst 05/03/2021   Liver, polycystic with compressive symptoms 05/03/2021   SIRS (systemic inflammatory response syndrome) (HCC) 04/08/2021   Right upper quadrant pain 04/08/2021   Common bile duct dilatation 04/08/2021   History of migraine headaches 04/08/2021   Hypokalemia, inadequate intake 04/08/2021   Hyponatremia 04/08/2021   Other hyperlipidemia 06/17/2020   History of vitamin D deficiency 06/17/2020   Endometrial cells on cervical Pap smear inconsistent w/LMP 09/18/2018   Fibroid 09/18/2018   Menorrhagia with irregular cycle 09/18/2018   History of basal cell carcinoma (BCC) 03/23/2018   Seropositive rheumatoid arthritis (HCC) 03/23/2018   Other fatigue 03/23/2018   Ganglion cyst 10/31/2017   Bilateral carpal tunnel syndrome 06/18/2017   Recurrent UTI 07/30/2013   Migraine 05/06/2013   Polycystic kidney disease 05/06/2013    Past Medical History:  Diagnosis Date   COVID 12/2020   Liver cyst 05/03/2021   Liver, polycystic with compressive symptoms 05/03/2021   Polycystic kidney disease     Family History  Problem Relation Age of Onset   Rheum arthritis Mother    Heart disease Mother        Pacemaker   COPD Mother        Smoker   Skin cancer Mother    Emphysema Mother    Heart Problems Mother    Stroke Father    Polycystic kidney disease Father    Healthy Sister    Arthritis Brother    Healthy Brother    Alzheimer's disease Maternal Grandmother 31   Diabetes Paternal  Grandfather    Skin cancer Paternal Grandfather    Cancer Paternal Grandfather    Healthy Daughter    Breast cancer Neg Hx    Colon cancer Neg Hx    Past Surgical History:  Procedure Laterality Date   ABDOMINAL HERNIA REPAIR     AUGMENTATION MAMMAPLASTY     CARPAL TUNNEL RELEASE Right    GANGLION CYST EXCISION Right    RETINAL TEAR REPAIR CRYOTHERAPY Bilateral    tendonitis surgery elbow     Social History   Social History Narrative   Right Handed    Lives in a two story home    Immunization History  Administered Date(s) Administered   Influenza Inj Mdck Quad Pf 04/14/2018, 04/29/2019, 05/16/2019   Influenza, High Dose Seasonal PF 04/20/2017   Influenza, Seasonal, Injecte, Preservative Fre 09/26/2014   Influenza-Unspecified 04/29/2019   Pneumococcal Polysaccharide-23 09/26/2014   Tdap 06/16/2017     Objective: Vital Signs: BP 124/78 (BP Location: Right Arm, Patient Position: Sitting, Cuff Size: Normal)   Pulse 80   Resp 14   Ht  5' (1.524 m)   Wt 120 lb (54.4 kg)   BMI 23.44 kg/m    Physical Exam Eyes:     Conjunctiva/sclera: Conjunctivae normal.  Cardiovascular:     Rate and Rhythm: Normal rate and regular rhythm.  Pulmonary:     Effort: Pulmonary effort is normal.     Breath sounds: Normal breath sounds.  Musculoskeletal:     Right lower leg: No edema.     Left lower leg: No edema.  Lymphadenopathy:     Cervical: No cervical adenopathy.  Skin:    General: Skin is warm and dry.     Findings: No rash.  Neurological:     Mental Status: She is alert.  Psychiatric:        Mood and Affect: Mood normal.      Musculoskeletal Exam:  Shoulders full ROM no tenderness or swelling Elbows full ROM no tenderness or swelling Wrists full ROM no tenderness or swelling Fingers full ROM no tenderness or swelling Knees full ROM left knee mild medial joint line tenderness to palpation, bilateral crepitus, no palpable effusions Ankles full ROM no tenderness or  swelling MTPs full ROM no tenderness or swelling   CDAI Exam: CDAI Score: 3  Patient Global: 10 mm; Provider Global: 10 mm Swollen: 0 ; Tender: 1  Joint Exam 01/03/2023      Right  Left  Knee      Tender     Investigation: No additional findings.  Imaging: No results found.  Recent Labs: Lab Results  Component Value Date   WBC 4.8 06/28/2022   HGB 13.2 06/28/2022   PLT 247 06/28/2022   NA 143 06/28/2022   K 3.7 06/28/2022   CL 102 06/28/2022   CO2 31 06/28/2022   GLUCOSE 80 06/28/2022   BUN 17 06/28/2022   CREATININE 1.69 (H) 06/28/2022   BILITOT 0.5 04/12/2021   ALKPHOS 94 04/12/2021   AST 40 04/12/2021   ALT 47 (H) 04/12/2021   PROT 7.3 08/09/2021   ALBUMIN 2.2 (L) 04/12/2021   CALCIUM 9.8 06/28/2022   GFRAA  08/31/2009    >60        The eGFR has been calculated using the MDRD equation. This calculation has not been validated in all clinical situations. eGFR's persistently <60 mL/min signify possible Chronic Kidney Disease.    Speciality Comments: No specialty comments available.  Procedures:  No procedures performed Allergies: Imitrex [sumatriptan], Latex, Ibuprofen, Nitrofurantoin, and Oseltamivir   Assessment / Plan:     Visit Diagnoses: Seropositive rheumatoid arthritis (HCC) - Plan: C-reactive protein, CBC with Differential/Platelet, BASIC METABOLIC PANEL WITH GFR, predniSONE (DELTASONE) 1 MG tablet  Low disease activity at this time although noticing a slight increase with prednisone taper.  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  Not a candidate for methotrexate due to CKD stage III attributed to polycystic kidney disease.  Disease severity not bad enough to require biologic DMARD treatment at this time.  Will recheck CRP for disease activity monitoring.  Checking CBC and BMP for medication monitoring.  Recent visual field testing for hydroxychloroquine retinal toxicity monitoring normal.  Plan to continue  hydroxychloroquine 200 mg daily and prednisone taper now by 1 mg increments monthly.  Orders: Orders Placed This Encounter  Procedures   C-reactive protein   CBC with Differential/Platelet   BASIC METABOLIC PANEL WITH GFR   Meds ordered this encounter  Medications   predniSONE (DELTASONE) 1 MG tablet    Sig: Take 2 tablets (  2 mg total) by mouth daily with breakfast for 30 days, THEN 1 tablet (1 mg total) daily with breakfast.    Dispense:  90 tablet    Refill:  0     Follow-Up Instructions: Return in about 6 months (around 07/05/2023) for RA on HCQ f/u 6mos.   Fuller Plan, MD  Note - This record has been created using [employer].  Chart creation errors have been sought, but may not always  have been located. Such creation errors do not reflect on  the standard of medical care.

## 2023-01-03 NOTE — Patient Instructions (Addendum)
I recommend continuing the hydroxychloroquine 200 mg once daily and can try continuing on the prednisone taper down by 1 mg/day each month. If noticing additional worsening of symptoms down to 1 mg or off prednisone we could resume this which changed to 2.5 mg tablet once daily for maintenance.

## 2023-01-04 LAB — CBC WITH DIFFERENTIAL/PLATELET
Absolute Monocytes: 423 cells/uL (ref 200–950)
Basophils Absolute: 41 cells/uL (ref 0–200)
Basophils Relative: 0.8 %
Eosinophils Absolute: 153 cells/uL (ref 15–500)
Eosinophils Relative: 3 %
HCT: 37.7 % (ref 35.0–45.0)
Hemoglobin: 12.5 g/dL (ref 11.7–15.5)
Lymphs Abs: 607 cells/uL — ABNORMAL LOW (ref 850–3900)
MCH: 26.7 pg — ABNORMAL LOW (ref 27.0–33.0)
MCHC: 33.2 g/dL (ref 32.0–36.0)
MCV: 80.6 fL (ref 80.0–100.0)
MPV: 10.5 fL (ref 7.5–12.5)
Monocytes Relative: 8.3 %
Neutro Abs: 3876 cells/uL (ref 1500–7800)
Neutrophils Relative %: 76 %
Platelets: 241 10*3/uL (ref 140–400)
RBC: 4.68 10*6/uL (ref 3.80–5.10)
RDW: 13.1 % (ref 11.0–15.0)
Total Lymphocyte: 11.9 %
WBC: 5.1 10*3/uL (ref 3.8–10.8)

## 2023-01-04 LAB — BASIC METABOLIC PANEL WITH GFR
BUN/Creatinine Ratio: 12 (calc) (ref 6–22)
BUN: 20 mg/dL (ref 7–25)
CO2: 30 mmol/L (ref 20–32)
Calcium: 9.7 mg/dL (ref 8.6–10.4)
Chloride: 103 mmol/L (ref 98–110)
Creat: 1.71 mg/dL — ABNORMAL HIGH (ref 0.50–1.03)
Glucose, Bld: 56 mg/dL — ABNORMAL LOW (ref 65–99)
Potassium: 3.9 mmol/L (ref 3.5–5.3)
Sodium: 143 mmol/L (ref 135–146)
eGFR: 35 mL/min/{1.73_m2} — ABNORMAL LOW (ref >=60)

## 2023-01-04 LAB — C-REACTIVE PROTEIN: CRP: 4.7 mg/L (ref <8.0)

## 2023-01-04 NOTE — Progress Notes (Signed)
CRP is normal. Lymphocyte count is slightly low at 607, this is a type of white blood cells. But her overall number is normal and not a problem unless much lower. Kidney function remains stable eGFR of 35.

## 2023-03-01 ENCOUNTER — Encounter: Payer: Self-pay | Admitting: Internal Medicine

## 2023-03-01 DIAGNOSIS — M059 Rheumatoid arthritis with rheumatoid factor, unspecified: Secondary | ICD-10-CM

## 2023-03-01 MED ORDER — PREDNISONE 5 MG PO TABS
10.0000 mg | ORAL_TABLET | Freq: Every day | ORAL | 1 refills | Status: AC
Start: 2023-03-01 — End: ?

## 2023-06-22 NOTE — Progress Notes (Unsigned)
Office Visit Note  Patient: Judy Manning             Date of Birth: 13-Nov-1965           MRN: [id number]             PCP: Lavada Mesi, MD Referring: Lavada Mesi, MD Visit Date: 07/05/2023   Subjective:  No chief complaint on file.   History of Present Illness: Judy Manning is a 57 y.o. female here for follow up for seropositive RA on hydroxychloroquine 200 mg daily and gradual prednisone taper.    Previous HPI 01/03/2023 Judy Manning is a 57 y.o. female here for follow up for seropositive RA on hydroxychloroquine 200 mg daily and gradual prednisone taper currently at 2 mg daily.  Overall symptoms are still doing pretty well but she has noticed some increase in joint pain and a few areas especially since decreasing the prednisone to less than 3 mg.  Fingers of both hands, medial border of left knee, and back of the right ankle have some stiffness but no associated swelling or nodules.  She followed up with Dr. Mina Marble in March for verruca on the index finger that was recurring did not follow-up about pursuing surgical removal due to him retiring.  Still notices the issues with vivid dreaming and has some easy bruising recently with a large area on the right shin.   Previous HPI 10/03/22 Judy Manning is a 57 y.o. female here for follow up for seropositive RA on HCQ 200 mg daily and prednisone 5 mg daily. She tried tapering prednisone down to 2.5 mg after last visit but developed swelling in her left hand and wrist within a few days. Since increasing the dose again symptoms improved. Dr. Prince Rome prescribed 1 mg tablets recommending to try a more gradual tapering but she has not tried this yet. She caught COVID since last visit probably from her husband and took ivermectin for this and symptoms started improving within 2-3 days without major complication.   Previous HPI 06/28/22 Judy Manning is a 57 y.o. female here for follow up for seropositive RA after starting HCQ  200 mg daily also continued on prednisone 5 mg daily. She is feeling very well on this regimen. Decreased hand swelling and morning stiffness. She is still concerned about progression of lateral deviation in her great toes and discoloration of the toenails.     Previous HPI 04/22/22 Judy Manning is a 57 y.o. female here for evaluation for rheumatoid arthritis with chronic joint pain at multiple sites and positive rheumatoid factor.  She reports joint pains more consistently for about 4 years duration.  Mostly affecting her hands and wrists on both sides.  She experiences intermittent pain and swelling especially on the fingers and backs of the hands.  More longstanding problem with knee pains most commonly provoked with climbing stairs.  Initially treatment with intermittent short prednisone tapers would resolve symptoms and last for a while between doses.  However more recently symptoms are returning and of her off medication so has been on 5 mg prednisone for management with PCP. She was also evaluated for lyme disease with negative serology. She also has a history significant for polycystic kidney disease and earlier this year was hospitalized with SIRS and findings of multiple liver cysts with one ruptured with abscess. She completed antibiotics course with resolution of flank pain and fevers and total of 45 days.  She sees [facility] Martha Clan at [facility] for [facility]mology with history of  retinal tears. She has a strong family history of rheumatoid arthritis.   No Rheumatology ROS completed.   PMFS History:  Patient Active Problem List   Diagnosis Date Noted   Toenail deformity 06/28/2022   Liver cyst 05/03/2021   Liver, polycystic with compressive symptoms 05/03/2021   SIRS (systemic inflammatory response syndrome) (HCC) 04/08/2021   Right upper quadrant pain 04/08/2021   Common bile duct dilatation 04/08/2021   History of migraine headaches 04/08/2021   Hypokalemia, inadequate intake  04/08/2021   Hyponatremia 04/08/2021   Other hyperlipidemia 06/17/2020   History of vitamin D deficiency 06/17/2020   Endometrial cells on cervical Pap smear inconsistent w/LMP 09/18/2018   Fibroid 09/18/2018   Menorrhagia with irregular cycle 09/18/2018   History of basal cell carcinoma (BCC) 03/23/2018   Seropositive rheumatoid arthritis (HCC) 03/23/2018   Other fatigue 03/23/2018   Ganglion cyst 10/31/2017   Bilateral carpal tunnel syndrome 06/18/2017   Recurrent UTI 07/30/2013   Migraine 05/06/2013   Polycystic kidney disease 05/06/2013    Past Medical History:  Diagnosis Date   COVID 12/2020   Liver cyst 05/03/2021   Liver, polycystic with compressive symptoms 05/03/2021   Polycystic kidney disease     Family History  Problem Relation Age of Onset   Rheum arthritis Mother    Heart disease Mother        Pacemaker   COPD Mother        Smoker   Skin cancer Mother    Emphysema Mother    Heart Problems Mother    Stroke Father    Polycystic kidney disease Father    Healthy Sister    Arthritis Brother    Healthy Brother    Alzheimer's disease Maternal Grandmother 69   Diabetes Paternal Grandfather    Skin cancer Paternal Grandfather    Cancer Paternal Grandfather    Healthy Daughter    Breast cancer Neg Hx    Colon cancer Neg Hx    Past Surgical History:  Procedure Laterality Date   ABDOMINAL HERNIA REPAIR     AUGMENTATION MAMMAPLASTY     CARPAL TUNNEL RELEASE Right    GANGLION CYST EXCISION Right    RETINAL TEAR REPAIR CRYOTHERAPY Bilateral    tendonitis surgery elbow     Social History   Social History Narrative   Right Handed    Lives in a two story home    Immunization History  Administered Date(s) Administered   Influenza Inj Mdck Quad Pf 04/14/2018, 04/29/2019, 05/16/2019   Influenza, High Dose Seasonal PF 04/20/2017   Influenza, Seasonal, Injecte, Preservative Fre 09/26/2014   Influenza-Unspecified 04/29/2019   Pneumococcal Polysaccharide-23  09/26/2014   Tdap 06/16/2017     Objective: Vital Signs: There were no vitals taken for this visit.   Physical Exam   Musculoskeletal Exam: ***  CDAI Exam: CDAI Score: -- Patient Global: --; Provider Global: -- Swollen: --; Tender: -- Joint Exam 07/05/2023   No joint exam has been documented for this visit   There is currently no information documented on the homunculus. Go to the Rheumatology activity and complete the homunculus joint exam.  Investigation: No additional findings.  Imaging: No results found.  Recent Labs: Lab Results  Component Value Date   WBC 5.1 01/03/2023   HGB 12.5 01/03/2023   PLT 241 01/03/2023   NA 143 01/03/2023   K 3.9 01/03/2023   CL 103 01/03/2023   CO2 30 01/03/2023   GLUCOSE 56 (L) 01/03/2023   BUN 20 01/03/2023  CREATININE 1.71 (H) 01/03/2023   BILITOT 0.5 04/12/2021   ALKPHOS 94 04/12/2021   AST 40 04/12/2021   ALT 47 (H) 04/12/2021   PROT 7.3 08/09/2021   ALBUMIN 2.2 (L) 04/12/2021   CALCIUM 9.7 01/03/2023   GFRAA  08/31/2009    >60        The eGFR has been calculated using the MDRD equation. This calculation has not been validated in all clinical situations. eGFR's persistently <60 mL/min signify possible Chronic Kidney Disease.    Speciality Comments: No specialty comments available.  Procedures:  No procedures performed Allergies: Imitrex [sumatriptan], Latex, Ibuprofen, Nitrofurantoin, and Oseltamivir   Assessment / Plan:     Visit Diagnoses: No diagnosis found.  ***  Orders: No orders of the defined types were placed in this encounter.  No orders of the defined types were placed in this encounter.    Follow-Up Instructions: No follow-ups on file.   Metta Clines, RT  Note - This record has been created using [employer].  Chart creation errors have been sought, but may not always  have been located. Such creation errors do not reflect on  the standard of medical care.

## 2023-07-05 ENCOUNTER — Ambulatory Visit: Payer: 59 | Admitting: Internal Medicine

## 2023-07-05 DIAGNOSIS — Z7952 Long term (current) use of systemic steroids: Secondary | ICD-10-CM

## 2023-07-05 DIAGNOSIS — Z79899 Other long term (current) drug therapy: Secondary | ICD-10-CM

## 2023-07-05 DIAGNOSIS — M059 Rheumatoid arthritis with rheumatoid factor, unspecified: Secondary | ICD-10-CM

## 2023-09-26 ENCOUNTER — Other Ambulatory Visit: Payer: Self-pay

## 2023-09-26 ENCOUNTER — Emergency Department (HOSPITAL_BASED_OUTPATIENT_CLINIC_OR_DEPARTMENT_OTHER): Payer: No Typology Code available for payment source

## 2023-09-26 ENCOUNTER — Inpatient Hospital Stay (HOSPITAL_BASED_OUTPATIENT_CLINIC_OR_DEPARTMENT_OTHER)
Admission: EM | Admit: 2023-09-26 | Discharge: 2023-09-28 | DRG: 872 | Disposition: A | Discharge status: Home / Self Care | Payer: No Typology Code available for payment source | Attending: Internal Medicine | Admitting: Internal Medicine

## 2023-09-26 ENCOUNTER — Encounter (HOSPITAL_BASED_OUTPATIENT_CLINIC_OR_DEPARTMENT_OTHER): Payer: Self-pay | Admitting: Emergency Medicine

## 2023-09-26 DIAGNOSIS — Z1152 Encounter for screening for COVID-19: Secondary | ICD-10-CM

## 2023-09-26 DIAGNOSIS — A419 Sepsis, unspecified organism: Secondary | ICD-10-CM | POA: Diagnosis not present

## 2023-09-26 DIAGNOSIS — Z8261 Family history of arthritis: Secondary | ICD-10-CM | POA: Diagnosis not present

## 2023-09-26 DIAGNOSIS — Z8271 Family history of polycystic kidney: Secondary | ICD-10-CM

## 2023-09-26 DIAGNOSIS — Q446 Cystic disease of liver: Secondary | ICD-10-CM

## 2023-09-26 DIAGNOSIS — E860 Dehydration: Secondary | ICD-10-CM | POA: Diagnosis present

## 2023-09-26 DIAGNOSIS — G609 Hereditary and idiopathic neuropathy, unspecified: Secondary | ICD-10-CM | POA: Diagnosis present

## 2023-09-26 DIAGNOSIS — Z881 Allergy status to other antibiotic agents status: Secondary | ICD-10-CM | POA: Diagnosis not present

## 2023-09-26 DIAGNOSIS — N1832 Chronic kidney disease, stage 3b: Secondary | ICD-10-CM | POA: Diagnosis present

## 2023-09-26 DIAGNOSIS — D631 Anemia in chronic kidney disease: Secondary | ICD-10-CM | POA: Diagnosis present

## 2023-09-26 DIAGNOSIS — Z886 Allergy status to analgesic agent status: Secondary | ICD-10-CM | POA: Diagnosis not present

## 2023-09-26 DIAGNOSIS — K7689 Other specified diseases of liver: Secondary | ICD-10-CM | POA: Diagnosis present

## 2023-09-26 DIAGNOSIS — Z7952 Long term (current) use of systemic steroids: Secondary | ICD-10-CM | POA: Diagnosis not present

## 2023-09-26 DIAGNOSIS — Z833 Family history of diabetes mellitus: Secondary | ICD-10-CM

## 2023-09-26 DIAGNOSIS — M069 Rheumatoid arthritis, unspecified: Secondary | ICD-10-CM | POA: Diagnosis present

## 2023-09-26 DIAGNOSIS — Q613 Polycystic kidney, unspecified: Secondary | ICD-10-CM | POA: Diagnosis not present

## 2023-09-26 DIAGNOSIS — G8929 Other chronic pain: Secondary | ICD-10-CM | POA: Diagnosis present

## 2023-09-26 DIAGNOSIS — Z8616 Personal history of COVID-19: Secondary | ICD-10-CM

## 2023-09-26 DIAGNOSIS — Z91040 Latex allergy status: Secondary | ICD-10-CM | POA: Diagnosis not present

## 2023-09-26 DIAGNOSIS — I959 Hypotension, unspecified: Principal | ICD-10-CM

## 2023-09-26 DIAGNOSIS — E861 Hypovolemia: Secondary | ICD-10-CM | POA: Diagnosis not present

## 2023-09-26 DIAGNOSIS — Z79899 Other long term (current) drug therapy: Secondary | ICD-10-CM

## 2023-09-26 DIAGNOSIS — G4700 Insomnia, unspecified: Secondary | ICD-10-CM | POA: Diagnosis present

## 2023-09-26 DIAGNOSIS — N179 Acute kidney failure, unspecified: Secondary | ICD-10-CM | POA: Diagnosis present

## 2023-09-26 DIAGNOSIS — Z8249 Family history of ischemic heart disease and other diseases of the circulatory system: Secondary | ICD-10-CM

## 2023-09-26 DIAGNOSIS — R509 Fever, unspecified: Secondary | ICD-10-CM | POA: Diagnosis not present

## 2023-09-26 DIAGNOSIS — R112 Nausea with vomiting, unspecified: Secondary | ICD-10-CM | POA: Diagnosis present

## 2023-09-26 DIAGNOSIS — Z888 Allergy status to other drugs, medicaments and biological substances status: Secondary | ICD-10-CM | POA: Diagnosis not present

## 2023-09-26 DIAGNOSIS — R198 Other specified symptoms and signs involving the digestive system and abdomen: Secondary | ICD-10-CM | POA: Diagnosis not present

## 2023-09-26 DIAGNOSIS — R55 Syncope and collapse: Secondary | ICD-10-CM

## 2023-09-26 LAB — COMPREHENSIVE METABOLIC PANEL
ALT: 12 U/L (ref 0–44)
AST: 17 U/L (ref 15–41)
Albumin: 3.7 g/dL (ref 3.5–5.0)
Alkaline Phosphatase: 79 U/L (ref 38–126)
Anion gap: 8 (ref 5–15)
BUN: 24 mg/dL — ABNORMAL HIGH (ref 6–20)
CO2: 30 mmol/L (ref 22–32)
Calcium: 9 mg/dL (ref 8.9–10.3)
Chloride: 97 mmol/L — ABNORMAL LOW (ref 98–111)
Creatinine, Ser: 2.08 mg/dL — ABNORMAL HIGH (ref 0.44–1.00)
GFR, Estimated: 28 mL/min — ABNORMAL LOW (ref >60)
Glucose, Bld: 123 mg/dL — ABNORMAL HIGH (ref 70–99)
Potassium: 3.7 mmol/L (ref 3.5–5.1)
Sodium: 135 mmol/L (ref 135–145)
Total Bilirubin: 0.7 mg/dL (ref 0.0–1.2)
Total Protein: 6.5 g/dL (ref 6.5–8.1)

## 2023-09-26 LAB — RESP PANEL BY RT-PCR (RSV, FLU A&B, COVID)  RVPGX2
Influenza A by PCR: NEGATIVE
Influenza B by PCR: NEGATIVE
Resp Syncytial Virus by PCR: NEGATIVE
SARS Coronavirus 2 by RT PCR: NEGATIVE

## 2023-09-26 LAB — CBC
HCT: 31.8 % — ABNORMAL LOW (ref 36.0–46.0)
Hemoglobin: 10.4 g/dL — ABNORMAL LOW (ref 12.0–15.0)
MCH: 26.9 pg (ref 26.0–34.0)
MCHC: 32.7 g/dL (ref 30.0–36.0)
MCV: 82.2 fL (ref 80.0–100.0)
Platelets: 179 10*3/uL (ref 150–400)
RBC: 3.87 MIL/uL (ref 3.87–5.11)
RDW: 13.8 % (ref 11.5–15.5)
WBC: 7.7 10*3/uL (ref 4.0–10.5)
nRBC: 0 % (ref 0.0–0.2)

## 2023-09-26 LAB — URINALYSIS, W/ REFLEX TO CULTURE (INFECTION SUSPECTED)
Bacteria, UA: NONE SEEN
Bilirubin Urine: NEGATIVE
Glucose, UA: NEGATIVE mg/dL
Hgb urine dipstick: NEGATIVE
Ketones, ur: NEGATIVE mg/dL
Leukocytes,Ua: NEGATIVE
Nitrite: NEGATIVE
Specific Gravity, Urine: 1.008 (ref 1.005–1.030)
pH: 7 (ref 5.0–8.0)

## 2023-09-26 LAB — PROTIME-INR
INR: 1 (ref 0.8–1.2)
Prothrombin Time: 13.6 s (ref 11.4–15.2)

## 2023-09-26 LAB — MAGNESIUM: Magnesium: 1.7 mg/dL (ref 1.7–2.4)

## 2023-09-26 LAB — PREGNANCY, URINE: Preg Test, Ur: NEGATIVE

## 2023-09-26 LAB — APTT: aPTT: 34 s (ref 24–36)

## 2023-09-26 LAB — LACTIC ACID, PLASMA
Lactic Acid, Venous: 0.7 mmol/L (ref 0.5–1.9)
Lactic Acid, Venous: 0.7 mmol/L (ref 0.5–1.9)

## 2023-09-26 LAB — LIPASE, BLOOD: Lipase: 33 U/L (ref 11–51)

## 2023-09-26 MED ORDER — ONDANSETRON HCL 4 MG/2ML IJ SOLN
4.0000 mg | Freq: Four times a day (QID) | INTRAMUSCULAR | Status: DC | PRN
  Administered 2023-09-27: 4 mg via INTRAVENOUS
  Filled 2023-09-26: qty 2

## 2023-09-26 MED ORDER — ENOXAPARIN SODIUM 30 MG/0.3ML IJ SOSY
30.0000 mg | PREFILLED_SYRINGE | INTRAMUSCULAR | Status: DC
  Administered 2023-09-26 – 2023-09-27 (×2): 30 mg via SUBCUTANEOUS
  Filled 2023-09-26 (×2): qty 0.3

## 2023-09-26 MED ORDER — SENNOSIDES-DOCUSATE SODIUM 8.6-50 MG PO TABS: 1.0000 | ORAL_TABLET | Freq: Every evening | ORAL | Status: DC | PRN

## 2023-09-26 MED ORDER — VANCOMYCIN HCL 1250 MG/250ML IV SOLN
1250.0000 mg | Freq: Once | INTRAVENOUS | Status: AC
  Administered 2023-09-26: 1250 mg via INTRAVENOUS
  Filled 2023-09-26: qty 250

## 2023-09-26 MED ORDER — SODIUM CHLORIDE 0.9 % IV SOLN: INTRAVENOUS | Status: AC

## 2023-09-26 MED ORDER — METRONIDAZOLE 500 MG/100ML IV SOLN
500.0000 mg | Freq: Once | INTRAVENOUS | Status: AC
  Administered 2023-09-26: 500 mg via INTRAVENOUS
  Filled 2023-09-26: qty 100

## 2023-09-26 MED ORDER — SODIUM CHLORIDE 0.9 % IV BOLUS (SEPSIS)
1000.0000 mL | Freq: Once | INTRAVENOUS | Status: AC
  Administered 2023-09-26: 1000 mL via INTRAVENOUS

## 2023-09-26 MED ORDER — HYDROXYCHLOROQUINE SULFATE 200 MG PO TABS
200.0000 mg | ORAL_TABLET | Freq: Every day | ORAL | Status: DC
  Administered 2023-09-26 – 2023-09-28 (×3): 200 mg via ORAL
  Filled 2023-09-26 (×3): qty 1

## 2023-09-26 MED ORDER — B COMPLEX-C PO TABS
1.0000 | ORAL_TABLET | Freq: Every day | ORAL | Status: DC
Start: 2023-09-26 — End: 2023-09-28
  Administered 2023-09-26 – 2023-09-28 (×3): 1 via ORAL
  Filled 2023-09-26 (×3): qty 1

## 2023-09-26 MED ORDER — ACETAMINOPHEN 325 MG PO TABS
650.0000 mg | ORAL_TABLET | Freq: Four times a day (QID) | ORAL | Status: DC | PRN
  Administered 2023-09-27: 650 mg via ORAL
  Filled 2023-09-26: qty 2

## 2023-09-26 MED ORDER — VANCOMYCIN HCL IN DEXTROSE 1-5 GM/200ML-% IV SOLN: 1000.0000 mg | INTRAVENOUS | Status: DC

## 2023-09-26 MED ORDER — GABAPENTIN 300 MG PO CAPS
300.0000 mg | ORAL_CAPSULE | Freq: Two times a day (BID) | ORAL | Status: DC
  Administered 2023-09-26 – 2023-09-28 (×4): 300 mg via ORAL
  Filled 2023-09-26 (×4): qty 1

## 2023-09-26 MED ORDER — SODIUM CHLORIDE 0.9 % IV BOLUS (SEPSIS)
500.0000 mL | Freq: Once | INTRAVENOUS | Status: AC
  Administered 2023-09-26: 500 mL via INTRAVENOUS

## 2023-09-26 MED ORDER — ADULT MULTIVITAMIN W/MINERALS CH
1.0000 | ORAL_TABLET | Freq: Every day | ORAL | Status: DC
  Administered 2023-09-26 – 2023-09-28 (×2): 1 via ORAL
  Filled 2023-09-26 (×3): qty 1

## 2023-09-26 MED ORDER — ONDANSETRON HCL 4 MG PO TABS: 4.0000 mg | ORAL_TABLET | Freq: Four times a day (QID) | ORAL | Status: DC | PRN

## 2023-09-26 MED ORDER — ACETAMINOPHEN 650 MG RE SUPP: 650.0000 mg | Freq: Four times a day (QID) | RECTAL | Status: DC | PRN

## 2023-09-26 MED ORDER — METRONIDAZOLE 500 MG/100ML IV SOLN
500.0000 mg | Freq: Two times a day (BID) | INTRAVENOUS | Status: DC
  Administered 2023-09-27 – 2023-09-28 (×4): 500 mg via INTRAVENOUS
  Filled 2023-09-26 (×4): qty 100

## 2023-09-26 MED ORDER — ZOLPIDEM TARTRATE 5 MG PO TABS
5.0000 mg | ORAL_TABLET | Freq: Every evening | ORAL | Status: DC | PRN
  Administered 2023-09-26: 5 mg via ORAL
  Filled 2023-09-26: qty 1

## 2023-09-26 MED ORDER — ACETAMINOPHEN 325 MG PO TABS
650.0000 mg | ORAL_TABLET | Freq: Once | ORAL | Status: AC
  Administered 2023-09-26: 650 mg via ORAL
  Filled 2023-09-26: qty 2

## 2023-09-26 MED ORDER — DULOXETINE HCL 30 MG PO CPEP
30.0000 mg | ORAL_CAPSULE | Freq: Every day | ORAL | Status: DC
  Administered 2023-09-26 – 2023-09-28 (×3): 30 mg via ORAL
  Filled 2023-09-26 (×3): qty 1

## 2023-09-26 MED ORDER — SODIUM CHLORIDE 0.9 % IV SOLN
2.0000 g | Freq: Once | INTRAVENOUS | Status: AC
  Administered 2023-09-26: 2 g via INTRAVENOUS
  Filled 2023-09-26: qty 20

## 2023-09-26 MED ORDER — SODIUM CHLORIDE 0.9 % IV SOLN
1.0000 g | INTRAVENOUS | Status: DC
  Administered 2023-09-27: 1 g via INTRAVENOUS
  Filled 2023-09-26: qty 10

## 2023-09-26 MED ORDER — SODIUM CHLORIDE 0.9 % IV BOLUS (SEPSIS)
250.0000 mL | Freq: Once | INTRAVENOUS | Status: AC
  Administered 2023-09-26: 250 mL via INTRAVENOUS

## 2023-09-26 MED ORDER — PREDNISONE 5 MG PO TABS
5.0000 mg | ORAL_TABLET | Freq: Every day | ORAL | Status: DC
  Administered 2023-09-27 – 2023-09-28 (×2): 5 mg via ORAL
  Filled 2023-09-26 (×2): qty 1

## 2023-09-26 MED ORDER — LORATADINE 10 MG PO TABS: 10.0000 mg | ORAL_TABLET | Freq: Every day | ORAL | Status: DC | PRN

## 2023-09-26 NOTE — ED Triage Notes (Addendum)
 Patient presents with nausea, vomiting, diarrhea and dizziness since 0100. Reports hx liver disease and concerned about same.  Patient also reports epigastric pain, headache and "passed out" once pta

## 2023-09-26 NOTE — ED Notes (Signed)
 Called Thomas at [facility] for transport 15:36

## 2023-09-26 NOTE — Progress Notes (Signed)
 Pharmacy Antibiotic Note  Judy Manning is a 58 y.o. female admitted on 09/26/2023 with vomiting, dizziness. Patient reports she passed out. Pharmacy has been consulted for vancomycin dosing for sepsis.  Plan: -Vancomycin 1250 mg IV x 1 followed by 1000 mg IV q48h based on current renal function -Continue to follow renal function, cultures and clinical progress for dose adjustments and de-escalation as indicated  Height: 5\' 5"  (165.1 cm) Weight: 59.3 kg (130 lb 11.7 oz) IBW/kg (Calculated) : 57  Temp (24hrs), Avg:100.6 F (38.1 C), Min:99 F (37.2 C), Max:103.8 F (39.9 C)  Recent Labs  Lab 09/26/23 0429 09/26/23 0540 09/26/23 0828  WBC 7.7  --   --   CREATININE 2.08*  --   --   LATICACIDVEN  --  0.7 0.7    Estimated Creatinine Clearance: 27.5 mL/min (A) (by C-G formula based on SCr of 2.08 mg/dL (H)).    Allergies  Allergen Reactions   Imitrex [Sumatriptan] Swelling    Throat gets tight   Latex Anaphylaxis, Itching and Rash   Ibuprofen Other (See Comments)    Polycystic kidney disease   Nitrofurantoin Rash   Oseltamivir Nausea And Vomiting    Antimicrobials this admission: Vancomycin 2/25 >> Ceftriaxone/metronidazole 2/25 x 1  Dose adjustments this admission: NA  Microbiology results: 2/25 BCx: pending   Thank you for allowing pharmacy to be a part of this patient's care.  Pricilla Riffle, PharmD, BCPS Clinical Pharmacist 09/26/2023 7:49 PM

## 2023-09-26 NOTE — Progress Notes (Signed)
 Plan of Care Note for accepted transfer   Patient: Judy Manning MRN: [id number]   DOA: 09/26/2023  Facility requesting transfer: [facility]. Requesting Provider: Margarita Grizzle, MD. Reason for transfer: Sepsis. Facility course:  58 year old female with past medical history of polycystic kidney and liver disease who presented to the emergency department with complaints of abdominal pain/distention, shoulder pain, several episodes of diarrhea, nausea, and episode of emesis and dizziness that made her briefly passed out.  Symptoms feel similar to when she had one of her cyst rupturing her abdomen and she developed sepsis. Her vital signs have normalized after IV fluids and IV antibiotics.  CBC showing anemia with a hemoglobin level of 10.4 g/dL.  Chemistry showing an increase in creatinine from 1.71 to 2.08 from 8 months ago.    Plan of care: The patient is accepted for admission to Telemetry unit, at [facility].   Author: Bobette Mo, MD 09/26/2023  Check [email or web address] for on-call coverage.  Nursing staff, Please call [facility] Admits & Consults System-Wide number on Amion as soon as patient's arrival, so appropriate admitting provider can evaluate the pt.

## 2023-09-26 NOTE — ED Provider Notes (Signed)
  Physical Exam  BP 90/69   Pulse 77   Temp 99.8 F (37.7 C) (Oral)   Resp 20   Ht 1.524 m (5')   SpO2 98%   BMI 23.44 kg/m   Physical Exam  Procedures  Procedures  ED Course / MDM    Medical Decision Making Amount and/or Complexity of Data Reviewed Labs: ordered. Radiology: ordered.  Risk Prescription drug management.   58 yo female hos polycystic kidney diseas with ho sepsis from ruptured and sepsis v and d. Today presented with abdominal pain and reported similar symptoms. BP 60/40 possible fever Sepsis work up with normal lactate ?UTI  30 cc /kg bolus MAP 90/70 Creatinine increased to 2.0 from first prior of 1.7 which is trending up 58 yo female complaiing of abdominal pain for 2-3 days, on both sides.  Pain in both shoulders.  History of klebsiella liver abscess with known polycystic liver and kidneys.  Patient presented hypotensive with fever from home loc. IV fluids given, lactic negative, urine clear, ct abd pelvis without acute abnormalities.  Patient improved with 107/68 and aki.   Plan admission for ongoing iv fluids, monitoring until cultures back. Medicine paged       Margarita Grizzle, MD 09/26/23 [phone]

## 2023-09-26 NOTE — Sepsis Progress Note (Signed)
 Per bedside RN Blood cultures were collected before antibiotics given.

## 2023-09-26 NOTE — ED Notes (Signed)
 Elevated temp of 102.9 oral/103.8 ax. VO Dr. Rosalia Hammers for Tylenol 650 mg PO.

## 2023-09-26 NOTE — H&P (Signed)
 History and Physical  Judy Manning GNF:[id number] DOB: June 03, 1966 DOA: 09/26/2023  PCP: Lavada Mesi, MD   Chief Complaint: N/V, abdominal pain, dizziness  HPI: Judy Manning is a 57 y.o. female with medical history significant for polycystic kidney cysts, liver cyst, rheumatoid arthritis, idiopathic peripheral neuropathy who presents to the [facility] ED for evaluation of nausea, vomiting, diarrhea and dizziness. Patient reports that since since yesterday, she has not had rigors and fever. This morning she had some nausea and vomiting, watery stools and some dizziness and a brief episode of syncope. Reports that her symptoms were similar to when she had her liver abscesses and cyst rupture a few years ago. She reports mild headache and epigastric pain that is chronic and has not changed. She denies any cough, chest pain, vision changes, hematuria, rashes or dysuria.  [facility] ED Course: Initial vitals showed temp 99.8, RR 19, HR 87, BP 84/59, SpO2 99% on room air. Labs shows creatinine 2.08, normal lactic acid, normal white count, Hgb 10.4, urine pregnancy test, normal UA, negative COVID, flu and RSV test. EKG showed normal sinus rhythm. CXR with no active disease. CT head with no acute intracranial abnormalities. CT A/P with no acute abdominal pelvic abnormalities. Sepsis protocol was activated and patient received IV fluid, IV Rocephin and IV Flagyl. Patient was admitted to [facility] services and transferred to [facility].  Review of Systems: Please see HPI for pertinent positives and negatives. A complete 10 system review of systems are otherwise negative.  Past Medical History:  Diagnosis Date   COVID 12/2020   Liver cyst 05/03/2021   Liver, polycystic with compressive symptoms 05/03/2021   Polycystic kidney disease    Past Surgical History:  Procedure Laterality Date   ABDOMINAL HERNIA REPAIR     AUGMENTATION MAMMAPLASTY     CARPAL TUNNEL RELEASE Right    GANGLION CYST EXCISION Right     RETINAL TEAR REPAIR CRYOTHERAPY Bilateral    tendonitis surgery elbow     Social History:  reports that she has never smoked. She has been exposed to tobacco smoke. She has never used smokeless tobacco. She reports that she does not currently use alcohol. She reports that she does not use drugs.  Allergies  Allergen Reactions   Imitrex [Sumatriptan] Swelling    Throat gets tight   Latex Anaphylaxis, Itching and Rash   Ibuprofen Other (See Comments)    Polycystic kidney disease   Nitrofurantoin Rash   Oseltamivir Nausea And Vomiting    Family History  Problem Relation Age of Onset   Rheum arthritis Mother    Heart disease Mother        Pacemaker   COPD Mother        Smoker   Skin cancer Mother    Emphysema Mother    Heart Problems Mother    Stroke Father    Polycystic kidney disease Father    Healthy Sister    Arthritis Brother    Healthy Brother    Alzheimer's disease Maternal Grandmother 66   Diabetes Paternal Grandfather    Skin cancer Paternal Grandfather    Cancer Paternal Grandfather    Healthy Daughter    Breast cancer Neg Hx    Colon cancer Neg Hx      Prior to Admission medications   Medication Sig Start Date End Date Taking? Authorizing Provider  AIMOVIG 140 MG/ML SOAJ Inject into the skin every 30 (thirty) days. 04/15/22  Yes [provider]  B Complex Vitamins (B COMPLEX-B12) TABS Take  by mouth.   Yes [provider]  cetirizine (ZYRTEC) 10 MG tablet Take 10 mg by mouth daily as needed for allergies (alternates with claritin).   Yes [provider]  DULoxetine (CYMBALTA) 30 MG capsule Take 1 capsule (30 mg total) by mouth daily. 08/09/21  Yes Patel, Donika K, DO  eletriptan (RELPAX) 40 MG tablet Take by mouth daily as needed. 02/16/22  Yes [provider]  fluticasone (FLONASE) 50 MCG/ACT nasal spray USE ONE SPRAY NASALLY DAILY 05/05/15  Yes [provider]  gabapentin (NEURONTIN) 300 MG capsule TAKE 1 CAPSULE BY  MOUTH  TWICE DAILY 12/29/20  Yes Hilts, Casimiro Needle, MD  hydroxychloroquine (PLAQUENIL) 200 MG tablet Take 1 tablet (200 mg total) by mouth daily. 10/03/22  Yes Rice, Jamesetta Orleans, MD  Multiple Vitamin (MULTIVITAMIN) tablet Take 1 tablet by mouth daily.   Yes [provider]  predniSONE (DELTASONE) 5 MG tablet Take 2 tablets (10 mg total) by mouth daily with breakfast. Patient taking differently: Take 5 mg by mouth daily with breakfast. 03/01/23  Yes Rice, Jamesetta Orleans, MD  zolpidem (AMBIEN) 10 MG tablet TAKE 1/2 TO 1 TABLET BY  MOUTH AT BEDTIME AS NEEDED  FOR SLEEP 03/23/21  Yes Hilts, Casimiro Needle, MD    Physical Exam: BP (!) 147/71 (BP Location: Right Arm)   Pulse 93   Temp 99.2 F (37.3 C) (Oral)   Resp 20   Ht 5\' 5"  (1.651 m)   Wt 59.3 kg   SpO2 96%   BMI 21.76 kg/m  General: Pleasant, acutely ill elderly woman laying in bed. No acute distress. HEENT: [place]/AT. Anicteric sclera. Dry mucous membranes. CV: RRR. No murmurs, rubs, or gallops. No LE edema Pulmonary: Lungs CTAB. Normal effort. No wheezing or rales. Abdominal: Soft, nontender, nondistended. Normal bowel sounds. Extremities: Palpable radial and DP pulses. Normal ROM. Skin: Warm and dry. No obvious rash or lesions. Neuro: A&Ox3. Moves all extremities. Normal sensation to light touch. No focal deficit. Psych: Normal mood and affect          Labs on Admission:  Basic Metabolic Panel: Recent Labs  Lab 09/26/23 0429 09/26/23 0958  NA 135  --   K 3.7  --   CL 97*  --   CO2 30  --   GLUCOSE 123*  --   BUN 24*  --   CREATININE 2.08*  --   CALCIUM 9.0  --   MG  --  1.7   Liver Function Tests: Recent Labs  Lab 09/26/23 0429  AST 17  ALT 12  ALKPHOS 79  BILITOT 0.7  PROT 6.5  ALBUMIN 3.7   Recent Labs  Lab 09/26/23 0429  LIPASE 33   No results for input(s): "AMMONIA" in the last 168 hours. CBC: Recent Labs  Lab 09/26/23 0429  WBC 7.7  HGB 10.4*  HCT 31.8*  MCV 82.2  PLT 179   Cardiac Enzymes: No  results for input(s): "CKTOTAL", "CKMB", "CKMBINDEX", "TROPONINI" in the last 168 hours. BNP (last 3 results) No results for input(s): "BNP" in the last 8760 hours.  ProBNP (last 3 results) No results for input(s): "PROBNP" in the last 8760 hours.  CBG: No results for input(s): "GLUCAP" in the last 168 hours.  Radiological Exams on Admission: CT ABDOMEN PELVIS WO CONTRAST Result Date: 09/26/2023 CLINICAL DATA:  Bowel obstruction suspected. Nausea, vomiting, and diarrhea. EXAM: CT ABDOMEN AND PELVIS WITHOUT CONTRAST TECHNIQUE: Multidetector CT imaging of the abdomen and pelvis was performed following the standard protocol without  IV contrast. RADIATION DOSE REDUCTION: This exam was performed according to the departmental dose-optimization program which includes automated exposure control, adjustment of the mA and/or kV according to patient size and/or use of iterative reconstruction technique. COMPARISON:  Abdominal CT 04/05/2021 FINDINGS: Lower chest:  No acute finding.  Partially covered breast implants. Hepatobiliary: Innumerable hepatic cysts. Some decrease in the dominant right hepatic cysts size with cyst wall calcification. No inflammation or acute hemorrhage detected.No evidence of biliary inflammation or calcified stone. Unchanged CBD dilatation to 9 mm. Pancreas: Unremarkable. Spleen: Unremarkable. Adrenals/Urinary Tract: Negative adrenals. The kidneys are enlarged and replaced by cysts of varying density and complexity, similar pattern to before. No focal inflamed cyst or layering recent appearing hemorrhage. Small scattered calcifications, which could be cyst wall or urinary. No hydronephrosis or ureteral stone. The bladder is largely decompressed and unremarkable. Stomach/Bowel:  No obstruction. No appendicitis. Vascular/Lymphatic: No acute vascular abnormality. No mass or adenopathy. Reproductive:No pathologic findings. Other: No ascites or pneumoperitoneum. Musculoskeletal: No acute  abnormalities. IMPRESSION: 1. Negative for bowel obstruction or visible inflammation. 2. Known polycystic liver and kidneys. Electronically Signed   By: Tiburcio Pea M.D.   On: 09/26/2023 06:23   DG Chest Port 1 View Result Date: 09/26/2023 CLINICAL DATA:  Possible sepsis. Nausea, vomiting, diarrhea, and dizziness. Epigastric pain. EXAM: PORTABLE CHEST 1 VIEW COMPARISON:  04/16/2021. FINDINGS: The heart size and mediastinal contours are within normal limits. No consolidation, effusion, or pneumothorax. No acute osseous abnormality. IMPRESSION: No active disease. Electronically Signed   By: Thornell Sartorius M.D.   On: 09/26/2023 05:59   CT HEAD WO CONTRAST ( ) Result Date: 09/26/2023 CLINICAL DATA:  Headache with increasing frequency or severity. EXAM: CT HEAD WITHOUT CONTRAST TECHNIQUE: Contiguous axial images were obtained from the base of the skull through the vertex without intravenous contrast. RADIATION DOSE REDUCTION: This exam was performed according to the departmental dose-optimization program which includes automated exposure control, adjustment of the mA and/or kV according to patient size and/or use of iterative reconstruction technique. COMPARISON:  10/04/2021 brain MRI FINDINGS: Brain: No evidence of acute infarction, hemorrhage, hydrocephalus, extra-axial collection or mass lesion/mass effect. Vascular: No hyperdense vessel or unexpected calcification. Skull: Normal. Negative for fracture or focal lesion. Sinuses/Orbits: No acute finding. IMPRESSION: No acute finding or explanation for headache. Electronically Signed   By: Tiburcio Pea M.D.   On: 09/26/2023 05:55   Assessment/Plan Judy Manning is a 58 y.o. female with medical history significant for polycystic kidney cysts, liver cysts, rheumatoid arthritis, idiopathic peripheral neuropathy who presents to [facility] for evaluation of nausea, vomiting, diarrhea and dizziness and admitted for sepsis of unknown source.   # Sepsis,  unknown source Patient with history of liver abscesses presented with few days of fevers, chills, nausea, vomiting, watery stool, dizziness and found to be hypotensive and febrile on admission. Imaging and labs have not revealed any source of infection. She continuous to have low-grade fever.  Will admit for sepsis of unknown source and follow-up blood cultures. -Continue IV Rocephin and Flagyl -Start IV vancomycin and check MRSA screen -Follow-up blood culture -Trend CBC, fever curve  # AKI on CKD 3B # Hx of PCKD Unclear baseline creatinine as her kidney function has progressed over the last 2 years. Creatinine elevated to 2.08 from 1.71 8 months ago.  This is likely secondary to dehydration in the setting of acute infection. -Continue IV NS at 100 cc/h -Avoid nephrotoxic agents -Trend renal function  # Liver cysts Imaging shows multiple hepatic cysts  with some that have decreased in size.  No other abnormalities in the hepatobiliary tract. Normal liver function test. -CTM  # Rheumatoid arthritis -Continue Plaquenil and prednisone  # Idiopathic peripheral neuropathy -Continue duloxetine and gabapentin  # Insomnia -Continue as needed Ambien for sleep  DVT prophylaxis: Lovenox     Code Status: Full Code  Consults called: None  Family Communication: Discussed admission with spouse at bedside  Severity of Illness: The appropriate patient status for this patient is INPATIENT. Inpatient status is judged to be reasonable and necessary in order to provide the required intensity of service to ensure the patient's safety. The patient's presenting symptoms, physical exam findings, and initial radiographic and laboratory data in the context of their chronic comorbidities is felt to place them at high risk for further clinical deterioration. Furthermore, it is not anticipated that the patient will be medically stable for discharge from the hospital within 2 midnights of admission.   * I  certify that at the point of admission it is my clinical judgment that the patient will require inpatient hospital care spanning beyond 2 midnights from the point of admission due to high intensity of service, high risk for further deterioration and high frequency of surveillance required.*  Level of care: Telemetry   Steffanie Rainwater, MD 09/26/2023, 6:04 PM [facility] Pager: [phone] Isaiah 41:10   If 7PM-7AM, please contact night-coverage [email or web address] Password [email or web address]

## 2023-09-26 NOTE — Sepsis Progress Note (Signed)
 Elink monitoring for the code sepsis protocol.

## 2023-09-26 NOTE — ED Provider Notes (Signed)
 [facility] Emergency Department Provider Note MRN:  [id number]  Arrival date & time: 09/26/23     Chief Complaint   Emesis, Dizziness, and Loss of Consciousness   History of Present Illness   Judy Manning is a 58 y.o. year-old female with a history of polycystic kidney and liver disease presenting to the ED with chief complaint of emesis this.  Patient feeling sick recently with abdominal distention, vomiting, diarrhea, dizziness, shoulder pain.  Feels similar to the last time she had one of her cysts rupture in her abdomen and developed sepsis because of it.  Also passed out this evening.  Review of Systems  A thorough review of systems was obtained and all systems are negative except as noted in the HPI and PMH.   Patient's Health History    Past Medical History:  Diagnosis Date   COVID 12/2020   Liver cyst 05/03/2021   Liver, polycystic with compressive symptoms 05/03/2021   Polycystic kidney disease     Past Surgical History:  Procedure Laterality Date   ABDOMINAL HERNIA REPAIR     AUGMENTATION MAMMAPLASTY     CARPAL TUNNEL RELEASE Right    GANGLION CYST EXCISION Right    RETINAL TEAR REPAIR CRYOTHERAPY Bilateral    tendonitis surgery elbow      Family History  Problem Relation Age of Onset   Rheum arthritis Mother    Heart disease Mother        Pacemaker   COPD Mother        Smoker   Skin cancer Mother    Emphysema Mother    Heart Problems Mother    Stroke Father    Polycystic kidney disease Father    Healthy Sister    Arthritis Brother    Healthy Brother    Alzheimer's disease Maternal Grandmother 66   Diabetes Paternal Grandfather    Skin cancer Paternal Grandfather    Cancer Paternal Grandfather    Healthy Daughter    Breast cancer Neg Hx    Colon cancer Neg Hx     Social History   Socioeconomic History   Marital status: Married    Spouse name: Not on file   Number of children: 1   Years of education: Not on file    Highest education level: Not on file  Occupational History   Not on file  Tobacco Use   Smoking status: Never    Passive exposure: Past   Smokeless tobacco: Never  Vaping Use   Vaping status: Never Used  Substance and Sexual Activity   Alcohol use: Not Currently    Comment: 1 yearly   Drug use: Never   Sexual activity: Not on file  Other Topics Concern   Not on file  Social History Narrative   Right Handed    Lives in a two story home    Social Drivers of Health   Financial Resource Strain: Not on file  Food Insecurity: Not on file  Transportation Needs: Not on file  Physical Activity: Not on file  Stress: Not on file  Social Connections: Unknown (12/12/2021)   Received from [facility]   Social Network    Social Network: Not on file  Intimate Partner Violence: Unknown (11/03/2021)   Received from [facility], [facility]   HITS    Physically Hurt: Not on file    Insult or Talk Down To: Not on file    Threaten Physical Harm: Not on file  Scream or Curse: Not on file     Physical Exam   Vitals:   09/26/23 0700 09/26/23 0730  BP: 90/69 109/65  Pulse: 77 79  Resp: 20 20  Temp:    SpO2: 98% 98%    CONSTITUTIONAL: Well-appearing, NAD NEURO/PSYCH:  Alert and oriented x 3, no focal deficits EYES:  eyes equal and reactive ENT/NECK:  no LAD, no JVD CARDIO: Regular rate, well-perfused, normal S1 and S2 PULM:  CTAB no wheezing or rhonchi GI/GU:  non-distended, non-tender MSK/SPINE:  No gross deformities, no edema SKIN:  no rash, atraumatic   *Additional and/or pertinent findings included in MDM below  Diagnostic and Interventional Summary    EKG Interpretation Date/Time:  Tuesday September 26 2023 06:04:44 EST Ventricular Rate:  76 PR Interval:  100 QRS Duration:  91 QT Interval:  395 QTC Calculation: 445 R Axis:   92  Text Interpretation: Sinus rhythm Short PR interval Borderline right axis deviation Confirmed by Kennis Carina [phone])  on 09/26/2023 6:44:19 AM       Labs Reviewed  COMPREHENSIVE METABOLIC PANEL - Abnormal; Notable for the following components:      Result Value   Chloride 97 (*)    Glucose, Bld 123 (*)    BUN 24 (*)    Creatinine, Ser 2.08 (*)    GFR, Estimated 28 (*)    All other components within normal limits  CBC - Abnormal; Notable for the following components:   Hemoglobin 10.4 (*)    HCT 31.8 (*)    All other components within normal limits  RESP PANEL BY RT-PCR (RSV, FLU A&B, COVID)  [id number]  CULTURE, BLOOD (ROUTINE X 2)  CULTURE, BLOOD (ROUTINE X 2)  LIPASE, BLOOD  LACTIC ACID, PLASMA  PROTIME-INR  APTT  PREGNANCY, URINE  LACTIC ACID, PLASMA  URINALYSIS, W/ REFLEX TO CULTURE (INFECTION SUSPECTED)    CT ABDOMEN PELVIS WO CONTRAST  Final Result    CT HEAD WO CONTRAST ( )  Final Result    DG Chest Port 1 View  Final Result      Medications  metroNIDAZOLE (FLAGYL) IVPB 500 mg (500 mg Intravenous New Bag/Given 09/26/23 0635)  sodium chloride 0.9 % bolus 1,000 mL (1,000 mLs Intravenous New Bag/Given 09/26/23 0533)    And  sodium chloride 0.9 % bolus 500 mL (500 mLs Intravenous New Bag/Given 09/26/23 0534)    And  sodium chloride 0.9 % bolus 250 mL (250 mLs Intravenous New Bag/Given 09/26/23 0535)  cefTRIAXone (ROCEPHIN) 2 g in sodium chloride 0.9 % 100 mL IVPB (2 g Intravenous New Bag/Given 09/26/23 0557)     Procedures  /  Critical Care Procedures  ED Course and Medical Decision Making  Initial Impression and Ddx Patient arrives hypotensive, had a syncopal episode this evening, is having abdominal pain with nausea vomiting diarrhea, shoulder blade pain bilaterally, feels similar to last episode of intra-abdominal sepsis.  Borderline febrile orally, code sepsis initiated, empiric antibiotics, fluids, will monitor blood pressure closely.  Past medical/surgical history that increases complexity of ED encounter: Polycystic kidney disease  Interpretation of Diagnostics I  personally reviewed the EKG and my interpretation is as follows: Sinus rhythm  Labs reveal mild acute kidney injury  Patient Reassessment and Ultimate Disposition/Management     Awaiting completion of infectious workup with urinalysis, continued IV fluids and blood pressure correction, may need admission.  Signed out to oncoming provider at shift change.  Patient management required discussion with the following services or consulting groups:  None  Complexity of Problems Addressed Acute illness or injury that poses threat of life of bodily function  Additional Data Reviewed and Analyzed Further history obtained from: Further history from spouse/family member  Additional Factors Impacting ED Encounter Risk Consideration of hospitalization  Elmer Sow. Pilar Plate, MD [facility] Emergency Medicine [facility] [email or web address]  Final Clinical Impressions(s) / ED Diagnoses     ICD-10-CM   1. Hypotension, unspecified hypotension type  I95.9     2. Syncope, unspecified syncope type  R55       ED Discharge Orders     None        Discharge Instructions Discussed with and Provided to Patient:   Discharge Instructions   None      Sabas Sous, MD 09/26/23 [phone]

## 2023-09-27 DIAGNOSIS — I959 Hypotension, unspecified: Secondary | ICD-10-CM

## 2023-09-27 DIAGNOSIS — E861 Hypovolemia: Secondary | ICD-10-CM | POA: Diagnosis not present

## 2023-09-27 DIAGNOSIS — M069 Rheumatoid arthritis, unspecified: Secondary | ICD-10-CM | POA: Diagnosis not present

## 2023-09-27 DIAGNOSIS — K7689 Other specified diseases of liver: Secondary | ICD-10-CM

## 2023-09-27 DIAGNOSIS — A419 Sepsis, unspecified organism: Secondary | ICD-10-CM | POA: Diagnosis not present

## 2023-09-27 LAB — BASIC METABOLIC PANEL
Anion gap: 9 (ref 5–15)
BUN: 21 mg/dL — ABNORMAL HIGH (ref 6–20)
CO2: 22 mmol/L (ref 22–32)
Calcium: 7.9 mg/dL — ABNORMAL LOW (ref 8.9–10.3)
Chloride: 105 mmol/L (ref 98–111)
Creatinine, Ser: 1.45 mg/dL — ABNORMAL HIGH (ref 0.44–1.00)
GFR, Estimated: 43 mL/min — ABNORMAL LOW (ref >60)
Glucose, Bld: 84 mg/dL (ref 70–99)
Potassium: 3.2 mmol/L — ABNORMAL LOW (ref 3.5–5.1)
Sodium: 136 mmol/L (ref 135–145)

## 2023-09-27 LAB — CBC
HCT: 32.2 % — ABNORMAL LOW (ref 36.0–46.0)
Hemoglobin: 10 g/dL — ABNORMAL LOW (ref 12.0–15.0)
MCH: 26.5 pg (ref 26.0–34.0)
MCHC: 31.1 g/dL (ref 30.0–36.0)
MCV: 85.4 fL (ref 80.0–100.0)
Platelets: 127 10*3/uL — ABNORMAL LOW (ref 150–400)
RBC: 3.77 MIL/uL — ABNORMAL LOW (ref 3.87–5.11)
RDW: 13.8 % (ref 11.5–15.5)
WBC: 4.4 10*3/uL (ref 4.0–10.5)
nRBC: 0 % (ref 0.0–0.2)

## 2023-09-27 LAB — RESPIRATORY PANEL BY PCR

## 2023-09-27 LAB — MRSA NEXT GEN BY PCR, NASAL: MRSA by PCR Next Gen: NOT DETECTED

## 2023-09-27 LAB — HIV ANTIBODY (ROUTINE TESTING W REFLEX): HIV Screen 4th Generation wRfx: NONREACTIVE

## 2023-09-27 MED ORDER — SODIUM CHLORIDE 0.9 % IV SOLN
1.0000 g | Freq: Once | INTRAVENOUS | Status: AC
  Administered 2023-09-27: 1 g via INTRAVENOUS
  Filled 2023-09-27: qty 10

## 2023-09-27 MED ORDER — CEFTRIAXONE SODIUM 2 G IJ SOLR
2.0000 g | INTRAMUSCULAR | Status: DC
  Administered 2023-09-28: 2 g via INTRAVENOUS
  Filled 2023-09-27: qty 20

## 2023-09-27 MED ORDER — POTASSIUM CHLORIDE CRYS ER 20 MEQ PO TBCR
40.0000 meq | EXTENDED_RELEASE_TABLET | Freq: Once | ORAL | Status: AC
  Administered 2023-09-27: 40 meq via ORAL
  Filled 2023-09-27: qty 2

## 2023-09-27 NOTE — TOC CM/SW Note (Signed)
 Transition of Care [facility]) - Inpatient Brief Assessment   Patient Details  Name: Judy Manning MRN: [id number] Date of Birth: 1966-07-08  Transition of Care [facility]) CM/SW Contact:    Larrie Kass, LCSW Phone Number: 09/27/2023, 2:59 PM   Clinical Narrative:  Pt has PCP and no SDOH concerns.    Transition of Care Asessment: Insurance and Status: Insurance coverage has been reviewed Patient has primary care physician: Yes Home environment has been reviewed: home with spouse Prior level of function:: independent Prior/Current Home Services: No current home services Social Drivers of Health Review: SDOH reviewed no interventions necessary Readmission risk has been reviewed: Yes Transition of care needs: no transition of care needs at this time

## 2023-09-27 NOTE — Consult Note (Incomplete)
 [facility]                                                                                        Patient Identification: Patient Name: Judy Manning MRN: [id number] Admit Date: 09/26/2023  4:23 AM Today's Date: 09/27/2023 Reason for consult: fevers Requesting provider: Dr Daiva Eves  Principal Problem:   Sepsis due to undetermined organism [facility]) Active Problems:   Hypotension   Antibiotics:  Ceftriaxone 2/24-c Metronidazole 2/24-c Vancomycin 2/25  Lines/Hardware:  Assessment # fevers, GI symptoms  - appears to be some viral infection,  - clinically improving with improved vomiting and diarrhea  - afebrile for more than 24 hrs  # RA  - on plaquenil and prednisone   # AKI on CKD - on IVF  - primary managing  Recommendations - continue ceftriaxone and metronidazole today  - Fu blood cultures - If continues to improve and negative blood cultures by tomorrow, would transition to PO augmentin to complete 7 days course. EOT 10/02/23 - ID will so for now, Please recall with questions or concerns  Rest of the management as per the primary team. Please call with questions or concerns.  Thank you for the consult  __________________________________________________________________________________________________________ HPI and Hospital Course: 58 Y O female with prior h/o polycystic liver and kidney disease, RA on HCQ and Prednisone, Idiopathic peripheral neuropathy, h/o infected liver cyst by Klebsiella in 04/2021 s/p treatment who presented to ED on 2/25 with acute onset nausea, vomiting, diarrhea, b/l pain in shoulder bladees, dizziness and passing out prior to arrival. She felt some chills last Thursday and Friday  with Temp of 100.3 on Sunday which was followed by onset of nausea, NBNB vomiting and loose stools. Felt similar to the time when one of her cysts ruptured in the abdomen. She  has chronic migrane and epigastric pain with no recent changes. Denies any recent sick contacts, recent travel and any unusual food intake. Denies cough, chest pain but mild SOB due to her polycystic disease. Denies GU symptoms or rashes.   At ED, initially afebrile but later had fevers, soft BP, code sepsis  CBC and CMP remarkable for AKI with Cr 2.08, negative flu A, flu B, RSV and SARScov2. MRSA PCR negative 2/25 blood cx 2/2 sets NGTD 2/26 RVP negative Started on IVF and Vancomycin, ceftriaxone and metronidazole CXR, CT head and CT AP with no acute/significant abnormalities.   ROS: General- Denies fever, chills, loss of appetite and loss of weight HEENT - Denies blurry vision, neck pain, sinus pain Chest - Denies any chest pain,or cough CVS- Denies any dizziness/lightheadedness, syncopal attacks, palpitations Abdomen- nausea, vomiting, diarrhea has improved  Neuro - Denies any weakness, tingling sensation, chronic numbness in rt foot+ Psych - Denies any changes in mood irritability or depressive symptoms GU- Denies any burning, dysuria, hematuria or increased frequency of urination Skin - denies any rashes/lesions MSK - denies any joint pain/swelling or restricted ROM   Past Medical History:  Diagnosis Date   COVID 12/2020   Liver cyst 05/03/2021   Liver, polycystic with compressive symptoms 05/03/2021   Polycystic kidney disease  Past Surgical History:  Procedure Laterality Date   ABDOMINAL HERNIA REPAIR     AUGMENTATION MAMMAPLASTY     CARPAL TUNNEL RELEASE Right    GANGLION CYST EXCISION Right    RETINAL TEAR REPAIR CRYOTHERAPY Bilateral    tendonitis surgery elbow     Scheduled Meds:  B-complex with vitamin C  1 tablet Oral Daily   DULoxetine  30 mg Oral Daily   enoxaparin (LOVENOX) injection  30 mg Subcutaneous Q24H   gabapentin  300 mg Oral BID   hydroxychloroquine  200 mg Oral Daily   multivitamin with minerals  1 tablet Oral Daily   predniSONE  5 mg Oral Q  breakfast   Continuous Infusions:  sodium chloride 100 mL/hr at 09/27/23 0959   [START ON 09/28/2023] cefTRIAXone (ROCEPHIN)  IV     metronidazole 500 mg (09/27/23 1228)   PRN Meds:.acetaminophen **OR** acetaminophen, loratadine, ondansetron **OR** ondansetron (ZOFRAN) IV, senna-docusate, zolpidem  Allergies  Allergen Reactions   Imitrex [Sumatriptan] Swelling    Throat gets tight   Latex Anaphylaxis, Itching and Rash   Ibuprofen Other (See Comments)    Polycystic kidney disease   Nitrofurantoin Rash   Oseltamivir Nausea And Vomiting   Social History   Socioeconomic History   Marital status: Married    Spouse name: Not on file   Number of children: 1   Years of education: Not on file   Highest education level: Not on file  Occupational History   Not on file  Tobacco Use   Smoking status: Never    Passive exposure: Past   Smokeless tobacco: Never  Vaping Use   Vaping status: Never Used  Substance and Sexual Activity   Alcohol use: Not Currently    Comment: 1 yearly   Drug use: Never   Sexual activity: Not on file  Other Topics Concern   Not on file  Social History Narrative   Right Handed    Lives in a two story home    Social Drivers of Health   Financial Resource Strain: Not on file  Food Insecurity: No Food Insecurity (09/26/2023)   Hunger Vital Sign    Worried About Running Out of Food in the Last Year: Never true    Ran Out of Food in the Last Year: Never true  Transportation Needs: No Transportation Needs (09/26/2023)   PRAPARE - Transportation    Lack of Transportation (Medical): No    Lack of Transportation (Non-Medical): No  Physical Activity: Not on file  Stress: Not on file  Social Connections: Unknown (12/12/2021)   [facility] from [facility]   Social Network    Social Network: Not on file  Intimate Partner Violence: Not At Risk (09/26/2023)   Humiliation, Afraid, Rape, and Kick questionnaire    Fear of Current or Ex-Partner: No     Emotionally Abused: No    Physically Abused: No    Sexually Abused: No   Family History  Problem Relation Age of Onset   Rheum arthritis Mother    Heart disease Mother        Pacemaker   COPD Mother        Smoker   Skin cancer Mother    Emphysema Mother    Heart Problems Mother    Stroke Father    Polycystic kidney disease Father    Healthy Sister    Arthritis Brother    Healthy Brother    Alzheimer's disease Maternal Grandmother 40   Diabetes Paternal Grandfather  Skin cancer Paternal Grandfather    Cancer Paternal Grandfather    Healthy Daughter    Breast cancer Neg Hx    Colon cancer Neg Hx    Vitals BP (!) 159/87 (BP Location: Left Arm)   Pulse 83   Temp 97.7 F (36.5 C) (Oral)   Resp 18   Ht 5\' 5"  (1.651 m)   Wt 59.3 kg   SpO2 98%   BMI 21.76 kg/m    Physical Exam Constitutional:      Comments: adult female sitting, not in acute distress and comfortable, HEENT wnl, oral mucosa moist  Cardiovascular:     Rate and Rhythm: Normal rate and regular rhythm.     Heart sounds: s1s2  Pulmonary:     Effort: Pulmonary effort is normal.     Comments: Normal breath sounds   Abdominal:     Palpations: Abdomen is soft.     Tenderness: non tender and non distended   Musculoskeletal:        General: No swelling or tenderness in peripheral joints   Skin:    Comments: no rashes   Neurological:     General: awake, alert and oriented, grossly non focal   Psychiatric:        Mood and Affect: Mood normal.   Pertinent Microbiology Results for orders placed or performed during the hospital encounter of 09/26/23  MRSA Next Gen by PCR, Nasal     Status: None   Collection Time: 09/26/23 12:57 AM   Specimen: Nasal Mucosa; Nasal Swab  Result Value Ref Range Status   MRSA by PCR Next Gen NOT DETECTED NOT DETECTED Final    Comment: (NOTE) The GeneXpert MRSA Assay (FDA approved for NASAL specimens only), is one component of a comprehensive MRSA colonization  surveillance program. It is not intended to diagnose MRSA infection nor to guide or monitor treatment for MRSA infections. Test performance is not FDA approved in patients less than 77 years old. Performed at [facility], [address], [place], [place] [address]   Resp panel by RT-PCR (RSV, Flu A&B, Covid)     Status: None   Collection Time: 09/26/23  5:40 AM   Specimen: Nasal Swab  Result Value Ref Range Status   SARS Coronavirus 2 by RT PCR NEGATIVE NEGATIVE Final    Comment: (NOTE) SARS-CoV-2 target nucleic acids are NOT DETECTED.  The SARS-CoV-2 RNA is generally detectable in upper respiratory specimens during the acute phase of infection. The lowest concentration of SARS-CoV-2 viral copies this assay can detect is 138 copies/mL. A negative result does not preclude SARS-Cov-2 infection and should not be used as the sole basis for treatment or other patient management decisions. A negative result may occur with  improper specimen collection/handling, submission of specimen other than nasopharyngeal swab, presence of viral mutation(s) within the areas targeted by this assay, and inadequate number of viral copies(<138 copies/mL). A negative result must be combined with clinical observations, patient history, and epidemiological information. The expected result is Negative.  Fact Sheet for Patients:  BloggerCourse.com  Fact Sheet for Healthcare Providers:  SeriousBroker.it  This test is no t yet approved or cleared by the [place] FDA and  has been authorized for detection and/or diagnosis of SARS-CoV-2 by FDA under an Emergency Use Authorization (EUA). This EUA will remain  in effect (meaning this test can be used) for the duration of the COVID-19 declaration under Section 564(b)(1) of the Act, 21 U.S.C.section 360bbb-3(b)(1), unless the authorization is  terminated  or revoked sooner.        Influenza A by PCR NEGATIVE NEGATIVE Final   Influenza B by PCR NEGATIVE NEGATIVE Final    Comment: (NOTE) The Xpert Xpress SARS-CoV-2/FLU/RSV plus assay is intended as an aid in the diagnosis of influenza from Nasopharyngeal swab specimens and should not be used as a sole basis for treatment. Nasal washings and aspirates are unacceptable for Xpert Xpress SARS-CoV-2/FLU/RSV testing.  Fact Sheet for Patients: BloggerCourse.com  Fact Sheet for Healthcare Providers: SeriousBroker.it  This test is not yet approved or cleared by the [place] FDA and has been authorized for detection and/or diagnosis of SARS-CoV-2 by FDA under an Emergency Use Authorization (EUA). This EUA will remain in effect (meaning this test can be used) for the duration of the COVID-19 declaration under Section 564(b)(1) of the Act, 21 U.S.C. section 360bbb-3(b)(1), unless the authorization is terminated or revoked.     Resp Syncytial Virus by PCR NEGATIVE NEGATIVE Final    Comment: (NOTE) Fact Sheet for Patients: BloggerCourse.com  Fact Sheet for Healthcare Providers: SeriousBroker.it  This test is not yet approved or cleared by the [place] FDA and has been authorized for detection and/or diagnosis of SARS-CoV-2 by FDA under an Emergency Use Authorization (EUA). This EUA will remain in effect (meaning this test can be used) for the duration of the COVID-19 declaration under Section 564(b)(1) of the Act, 21 U.S.C. section 360bbb-3(b)(1), unless the authorization is terminated or revoked.  Performed at [facility], [address], [place], [place] [address]   Culture, blood (Routine X 2) w Reflex to ID Panel     Status: None (Preliminary result)   Collection Time: 09/26/23  5:45 AM   Specimen: BLOOD  Result Value Ref Range Status   Specimen Description   Final     BLOOD Performed at [facility], [address], [place], [place] [address]    Special Requests   Final    NONE Performed at [facility], [address], [place], [place] [address]    Culture   Final    NO GROWTH < 24 HOURS Performed at [facility], [address], [place], [place] [address]    Report Status PENDING  Incomplete  Culture, blood (Routine X 2) w Reflex to ID Panel     Status: None (Preliminary result)   Collection Time: 09/26/23  5:50 AM   Specimen: BLOOD  Result Value Ref Range Status   Specimen Description   Final    BLOOD Performed at [facility], [address], [place], [place] [address]    Special Requests   Final    NONE Performed at [facility], [address], [place], [place] [address]    Culture   Final    NO GROWTH < 24 HOURS Performed at [facility], [address] [place], [place] [address]    Report Status PENDING  Incomplete  Respiratory (~20 pathogens) panel by PCR     Status: None   Collection Time: 09/27/23 10:04 AM   Specimen: Nasopharyngeal Swab; Respiratory  Result Value Ref Range Status   Adenovirus NOT DETECTED NOT DETECTED Final   Coronavirus 229E NOT DETECTED NOT DETECTED Final    Comment: (NOTE) The Coronavirus on the Respiratory Panel, DOES NOT test for the novel  Coronavirus (2019 nCoV)    Coronavirus HKU1 NOT DETECTED NOT DETECTED Final   Coronavirus NL63 NOT DETECTED NOT DETECTED Final   Coronavirus  OC43 NOT DETECTED NOT DETECTED Final   Metapneumovirus NOT DETECTED NOT DETECTED Final   Rhinovirus / Enterovirus NOT DETECTED NOT DETECTED Final   Influenza A NOT DETECTED NOT DETECTED Final   Influenza B NOT DETECTED NOT DETECTED Final   Parainfluenza Virus 1 NOT DETECTED NOT DETECTED Final   Parainfluenza Virus 2 NOT DETECTED NOT DETECTED Final   Parainfluenza Virus 3 NOT DETECTED NOT DETECTED Final   Parainfluenza Virus 4 NOT  DETECTED NOT DETECTED Final   Respiratory Syncytial Virus NOT DETECTED NOT DETECTED Final   Bordetella pertussis NOT DETECTED NOT DETECTED Final   Bordetella Parapertussis NOT DETECTED NOT DETECTED Final   Chlamydophila pneumoniae NOT DETECTED NOT DETECTED Final   Mycoplasma pneumoniae NOT DETECTED NOT DETECTED Final    Comment: Performed at [facility], [address] [place], [place] [address]    Pertinent Lab seen by me:    Latest Ref Rng & Units 09/27/2023    4:24 AM 09/26/2023    4:29 AM 01/03/2023   10:54 AM  CBC  WBC 4.0 - 10.5 K/uL 4.4  7.7  5.1   Hemoglobin 12.0 - 15.0 g/dL 28.4  13.2  44.0   Hematocrit 36.0 - 46.0 % 32.2  31.8  37.7   Platelets 150 - 400 K/uL 127  179  241       Latest Ref Rng & Units 09/27/2023    4:24 AM 09/26/2023    4:29 AM 01/03/2023   10:54 AM  CMP  Glucose 70 - 99 mg/dL 84  102  56   BUN 6 - 20 mg/dL 21  24  20    Creatinine 0.44 - 1.00 mg/dL 7.25  3.66  4.40   Sodium 135 - 145 mmol/L 136  135  143   Potassium 3.5 - 5.1 mmol/L 3.2  3.7  3.9   Chloride 98 - 111 mmol/L 105  97  103   CO2 22 - 32 mmol/L 22  30  30    Calcium 8.9 - 10.3 mg/dL 7.9  9.0  9.7   Total Protein 6.5 - 8.1 g/dL  6.5    Total Bilirubin 0.0 - 1.2 mg/dL  0.7    Alkaline Phos 38 - 126 U/L  79    AST 15 - 41 U/L  17    ALT 0 - 44 U/L  12       Pertinent Imagings/Other Imagings Plain films and CT images have been personally visualized and interpreted; radiology reports have been reviewed. Decision making incorporated into the Impression / Recommendations.  CT ABDOMEN PELVIS WO CONTRAST Result Date: 09/26/2023 CLINICAL DATA:  Bowel obstruction suspected. Nausea, vomiting, and diarrhea. EXAM: CT ABDOMEN AND PELVIS WITHOUT CONTRAST TECHNIQUE: Multidetector CT imaging of the abdomen and pelvis was performed following the standard protocol without IV contrast. RADIATION DOSE REDUCTION: This exam was performed according to the departmental dose-optimization program which  includes automated exposure control, adjustment of the mA and/or kV according to patient size and/or use of iterative reconstruction technique. COMPARISON:  Abdominal CT 04/05/2021 FINDINGS: Lower chest:  No acute finding.  Partially covered breast implants. Hepatobiliary: Innumerable hepatic cysts. Some decrease in the dominant right hepatic cysts size with cyst wall calcification. No inflammation or acute hemorrhage detected.No evidence of biliary inflammation or calcified stone. Unchanged CBD dilatation to 9 mm. Pancreas: Unremarkable. Spleen: Unremarkable. Adrenals/Urinary Tract: Negative adrenals. The kidneys are enlarged and replaced by cysts of varying density and complexity, similar pattern to before. No focal inflamed cyst or  layering recent appearing hemorrhage. Small scattered calcifications, which could be cyst wall or urinary. No hydronephrosis or ureteral stone. The bladder is largely decompressed and unremarkable. Stomach/Bowel:  No obstruction. No appendicitis. Vascular/Lymphatic: No acute vascular abnormality. No mass or adenopathy. Reproductive:No pathologic findings. Other: No ascites or pneumoperitoneum. Musculoskeletal: No acute abnormalities. IMPRESSION: 1. Negative for bowel obstruction or visible inflammation. 2. Known polycystic liver and kidneys. Electronically Signed   By: Tiburcio Pea M.D.   On: 09/26/2023 06:23   DG Chest Port 1 View Result Date: 09/26/2023 CLINICAL DATA:  Possible sepsis. Nausea, vomiting, diarrhea, and dizziness. Epigastric pain. EXAM: PORTABLE CHEST 1 VIEW COMPARISON:  04/16/2021. FINDINGS: The heart size and mediastinal contours are within normal limits. No consolidation, effusion, or pneumothorax. No acute osseous abnormality. IMPRESSION: No active disease. Electronically Signed   By: Thornell Sartorius M.D.   On: 09/26/2023 05:59   CT HEAD WO CONTRAST ( ) Result Date: 09/26/2023 CLINICAL DATA:  Headache with increasing frequency or severity. EXAM: CT HEAD  WITHOUT CONTRAST TECHNIQUE: Contiguous axial images were obtained from the base of the skull through the vertex without intravenous contrast. RADIATION DOSE REDUCTION: This exam was performed according to the departmental dose-optimization program which includes automated exposure control, adjustment of the mA and/or kV according to patient size and/or use of iterative reconstruction technique. COMPARISON:  10/04/2021 brain MRI FINDINGS: Brain: No evidence of acute infarction, hemorrhage, hydrocephalus, extra-axial collection or mass lesion/mass effect. Vascular: No hyperdense vessel or unexpected calcification. Skull: Normal. Negative for fracture or focal lesion. Sinuses/Orbits: No acute finding. IMPRESSION: No acute finding or explanation for headache. Electronically Signed   By: Tiburcio Pea M.D.   On: 09/26/2023 05:55   I have personally spent 85 minutes involved in face-to-face and non-face-to-face activities for this patient on the day of the visit. Professional time spent includes the following activities: Preparing to see the patient (review of tests), Obtaining and/or reviewing separately obtained history (admission/discharge record), Performing a medically appropriate examination and/or evaluation , Ordering medications/tests/procedures, referring and communicating with other health care professionals, Documenting clinical information in the EMR, Independently interpreting results (not separately reported), Communicating results to the patient/family/caregiver, Counseling and educating the patient/family/caregiver and Care coordination (not separately reported).  Electronically signed by:   Plan d/w requesting provider as well as ID pharm D  Of note, portions of this note may have been created with voice recognition software. While this note has been edited for accuracy, occasional wrong-word or 'sound-a-like' substitutions may have occurred due to the inherent limitations of voice recognition  software.   Odette Fraction, MD Infectious Disease Physician [facility] Pager: [phone]

## 2023-09-27 NOTE — Progress Notes (Signed)
 Progress Note   Patient: Judy Manning ZOX:[id number] DOB: 15-Apr-1966 DOA: 09/26/2023     1 DOS: the patient was seen and examined on 09/27/2023   Brief hospital course: Judy Manning is a 58 y.o. female with medical history significant for polycystic kidney cysts, liver cyst, rheumatoid arthritis, idiopathic peripheral neuropathy who presents to the [facility] ED for evaluation of nausea, vomiting, diarrhea and dizziness.  Patient is admitted to hospitalist service for further management evaluation of sepsis of unknown origin Assessment and Plan: Sepsis unknown origin Prior history of liver abscesses Presented with fever, chills, nausea, vomiting, watery stool. Patient was hypotensive and febrile on admission. Continue IV Rocephin, Flagyl therapy. MRSA screen negative, stop vancomycin. Follow blood cultures. ID evaluation(known patient to ID)  AKI on CKD stage IIIb Continue gentle IV fluids. Monitor daily renal function. Avoid nephrotoxic's.  Hepatic cysts: Decreased from prior imaging studies. Normal liver function.  Rheumatoid arthritis: Puts her at risk for infection given her use of Plaquenil and prednisone.  Peripheral neuropathy Continue duloxetine and gabapentin.  Continue anxiolytics.   Out of bed to chair. Incentive spirometry. Nursing supportive care. Fall, aspiration precautions. DVT prophylaxis   Code Status: Full Code  Subjective: Patient is seen and examined today morning. She is feeling better. Nausea improved. Eating fair.  Physical Exam: Vitals:   09/27/23 0437 09/27/23 0807 09/27/23 1246 09/27/23 1549  BP: (!) 146/95 (!) 154/86 134/71 (!) 153/93  Pulse: 79 99 86 85  Resp: 18  18 17   Temp: 98.9 F (37.2 C) 98.2 F (36.8 C) 98.5 F (36.9 C) 98.6 F (37 C)  TempSrc: Oral Oral Oral Oral  SpO2: 97% 97% 98% 97%  Weight:      Height:        General - Middle aged Caucasian female, no apparent distress HEENT - PERRLA, EOMI, atraumatic head,  wear right eye patch. Lung - Clear, basal rales, rhonchi, wheezes. Heart - S1, S2 heard, no murmurs, rubs, no pedal edema. Abdomen - Soft, non tender, bowel sounds good Neuro - Alert, awake and oriented x 3, non focal exam. Skin - Warm and dry.  Data Reviewed:      Latest Ref Rng & Units 09/27/2023    4:24 AM 09/26/2023    4:29 AM 01/03/2023   10:54 AM  CBC  WBC 4.0 - 10.5 K/uL 4.4  7.7  5.1   Hemoglobin 12.0 - 15.0 g/dL 81.1  91.4  78.2   Hematocrit 36.0 - 46.0 % 32.2  31.8  37.7   Platelets 150 - 400 K/uL 127  179  241       Latest Ref Rng & Units 09/27/2023    4:24 AM 09/26/2023    4:29 AM 01/03/2023   10:54 AM  BMP  Glucose 70 - 99 mg/dL 84  956  56   BUN 6 - 20 mg/dL 21  24  20    Creatinine 0.44 - 1.00 mg/dL 2.13  0.86  5.78   BUN/Creat Ratio 6 - 22 (calc)   12   Sodium 135 - 145 mmol/L 136  135  143   Potassium 3.5 - 5.1 mmol/L 3.2  3.7  3.9   Chloride 98 - 111 mmol/L 105  97  103   CO2 22 - 32 mmol/L 22  30  30    Calcium 8.9 - 10.3 mg/dL 7.9  9.0  9.7    CT ABDOMEN PELVIS WO CONTRAST Result Date: 09/26/2023 CLINICAL DATA:  Bowel obstruction suspected. Nausea, vomiting, and diarrhea. EXAM: CT  ABDOMEN AND PELVIS WITHOUT CONTRAST TECHNIQUE: Multidetector CT imaging of the abdomen and pelvis was performed following the standard protocol without IV contrast. RADIATION DOSE REDUCTION: This exam was performed according to the departmental dose-optimization program which includes automated exposure control, adjustment of the mA and/or kV according to patient size and/or use of iterative reconstruction technique. COMPARISON:  Abdominal CT 04/05/2021 FINDINGS: Lower chest:  No acute finding.  Partially covered breast implants. Hepatobiliary: Innumerable hepatic cysts. Some decrease in the dominant right hepatic cysts size with cyst wall calcification. No inflammation or acute hemorrhage detected.No evidence of biliary inflammation or calcified stone. Unchanged CBD dilatation to 9 mm.  Pancreas: Unremarkable. Spleen: Unremarkable. Adrenals/Urinary Tract: Negative adrenals. The kidneys are enlarged and replaced by cysts of varying density and complexity, similar pattern to before. No focal inflamed cyst or layering recent appearing hemorrhage. Small scattered calcifications, which could be cyst wall or urinary. No hydronephrosis or ureteral stone. The bladder is largely decompressed and unremarkable. Stomach/Bowel:  No obstruction. No appendicitis. Vascular/Lymphatic: No acute vascular abnormality. No mass or adenopathy. Reproductive:No pathologic findings. Other: No ascites or pneumoperitoneum. Musculoskeletal: No acute abnormalities. IMPRESSION: 1. Negative for bowel obstruction or visible inflammation. 2. Known polycystic liver and kidneys. Electronically Signed   By: Tiburcio Pea M.D.   On: 09/26/2023 06:23   DG Chest Port 1 View Result Date: 09/26/2023 CLINICAL DATA:  Possible sepsis. Nausea, vomiting, diarrhea, and dizziness. Epigastric pain. EXAM: PORTABLE CHEST 1 VIEW COMPARISON:  04/16/2021. FINDINGS: The heart size and mediastinal contours are within normal limits. No consolidation, effusion, or pneumothorax. No acute osseous abnormality. IMPRESSION: No active disease. Electronically Signed   By: Thornell Sartorius M.D.   On: 09/26/2023 05:59   CT HEAD WO CONTRAST ( ) Result Date: 09/26/2023 CLINICAL DATA:  Headache with increasing frequency or severity. EXAM: CT HEAD WITHOUT CONTRAST TECHNIQUE: Contiguous axial images were obtained from the base of the skull through the vertex without intravenous contrast. RADIATION DOSE REDUCTION: This exam was performed according to the departmental dose-optimization program which includes automated exposure control, adjustment of the mA and/or kV according to patient size and/or use of iterative reconstruction technique. COMPARISON:  10/04/2021 brain MRI FINDINGS: Brain: No evidence of acute infarction, hemorrhage, hydrocephalus, extra-axial  collection or mass lesion/mass effect. Vascular: No hyperdense vessel or unexpected calcification. Skull: Normal. Negative for fracture or focal lesion. Sinuses/Orbits: No acute finding. IMPRESSION: No acute finding or explanation for headache. Electronically Signed   By: Tiburcio Pea M.D.   On: 09/26/2023 05:55   Family Communication: Discussed with patient,understand and agree. All questions answereed.  Disposition: Status is: Inpatient Remains inpatient appropriate because: sepsis, IV antibiotics  Planned Discharge Destination: Home     Time spent: 38 minutes  Author: Marcelino Duster, MD 09/27/2023 5:54 PM Secure chat 7am to 7pm For on call review [email or web address].

## 2023-09-27 NOTE — Progress Notes (Signed)
   09/27/23 0201  Assess: MEWS Score  Temp (!) 100.9 F (38.3 C)  BP (!) 149/78  MAP (mmHg) 94  Pulse Rate (!) 111  Resp 19  SpO2 96 %  Assess: MEWS Score  MEWS Temp 1  MEWS Systolic 0  MEWS Pulse 2  MEWS RR 0  MEWS LOC 0  MEWS Score 3  MEWS Score Color Yellow  Assess: if the MEWS score is Yellow or Red  Were vital signs accurate and taken at a resting state? Yes  Does the patient meet 2 or more of the SIRS criteria? No  MEWS guidelines implemented  Yes, yellow  Treat  MEWS Interventions Considered administering scheduled or prn medications/treatments as ordered  Take Vital Signs  Increase Vital Sign Frequency  Yellow: Q2hr x1, continue Q4hrs until patient remains green for 12hrs  Escalate  MEWS: Escalate Yellow: Discuss with charge nurse and consider notifying provider and/or RRT  Notify: Charge Nurse/RN  Name of Charge Nurse/RN Notified Joanna, RN  Assess: SIRS CRITERIA  SIRS Temperature  0  SIRS Respirations  0  SIRS Pulse 1  SIRS WBC 0  SIRS Score Sum  1

## 2023-09-28 ENCOUNTER — Other Ambulatory Visit (HOSPITAL_COMMUNITY): Payer: Self-pay

## 2023-09-28 DIAGNOSIS — A419 Sepsis, unspecified organism: Secondary | ICD-10-CM | POA: Diagnosis not present

## 2023-09-28 DIAGNOSIS — R509 Fever, unspecified: Secondary | ICD-10-CM

## 2023-09-28 DIAGNOSIS — R198 Other specified symptoms and signs involving the digestive system and abdomen: Secondary | ICD-10-CM

## 2023-09-28 DIAGNOSIS — N179 Acute kidney failure, unspecified: Secondary | ICD-10-CM

## 2023-09-28 DIAGNOSIS — K7689 Other specified diseases of liver: Secondary | ICD-10-CM | POA: Diagnosis not present

## 2023-09-28 DIAGNOSIS — E861 Hypovolemia: Secondary | ICD-10-CM | POA: Diagnosis not present

## 2023-09-28 LAB — BASIC METABOLIC PANEL
Anion gap: 9 (ref 5–15)
BUN: 16 mg/dL (ref 6–20)
CO2: 24 mmol/L (ref 22–32)
Calcium: 8.1 mg/dL — ABNORMAL LOW (ref 8.9–10.3)
Chloride: 104 mmol/L (ref 98–111)
Creatinine, Ser: 1.66 mg/dL — ABNORMAL HIGH (ref 0.44–1.00)
GFR, Estimated: 36 mL/min — ABNORMAL LOW (ref >60)
Glucose, Bld: 118 mg/dL — ABNORMAL HIGH (ref 70–99)
Potassium: 3.2 mmol/L — ABNORMAL LOW (ref 3.5–5.1)
Sodium: 137 mmol/L (ref 135–145)

## 2023-09-28 LAB — CBC
HCT: 33.4 % — ABNORMAL LOW (ref 36.0–46.0)
Hemoglobin: 10.4 g/dL — ABNORMAL LOW (ref 12.0–15.0)
MCH: 26.3 pg (ref 26.0–34.0)
MCHC: 31.1 g/dL (ref 30.0–36.0)
MCV: 84.6 fL (ref 80.0–100.0)
Platelets: 178 10*3/uL (ref 150–400)
RBC: 3.95 MIL/uL (ref 3.87–5.11)
RDW: 13.9 % (ref 11.5–15.5)
WBC: 5 10*3/uL (ref 4.0–10.5)
nRBC: 0 % (ref 0.0–0.2)

## 2023-09-28 MED ORDER — AMOXICILLIN-POT CLAVULANATE 875-125 MG PO TABS: 1.0000 | ORAL_TABLET | Freq: Two times a day (BID) | ORAL | 0 refills | Status: AC

## 2023-09-28 NOTE — Progress Notes (Signed)
 Discharge medications delivered from outpatient [facility] pharmacy by this RN

## 2023-09-28 NOTE — Discharge Summary (Incomplete)
 Physician Discharge Summary   Patient: Judy Manning MRN: [id number] DOB: [birth year]  Admit date:     09/26/2023  Discharge date: 09/28/23  Discharge Physician: Marcelino Duster   PCP: Lavada Mesi, MD   Recommendations at discharge:  {Tip this will not be part of the note when signed- Example include specific recommendations for outpatient follow-up, pending tests to follow-up on. (Optional):26781}  PCP follow up in 1 week.  Discharge Diagnoses: Principal Problem:   Sepsis due to undetermined organism [facility]) Active Problems:   Hypotension  Resolved Problems:   * No resolved hospital problems. *  Hospital Course: Judy Manning is a 58 y.o. female with medical history significant for polycystic kidney cysts, liver cyst, rheumatoid arthritis, idiopathic peripheral neuropathy who presents to the [facility] ED for evaluation of nausea, vomiting, diarrhea and dizziness.  Patient is admitted to hospitalist service for further management evaluation of sepsis of unknown origin  Assessment and Plan: Sepsis unknown origin Prior history of liver abscesses Presented with fever, chills, nausea, vomiting, watery stool. Patient was hypotensive and febrile on admission. MRSA screen negative, stopped vancomycin. Blood cultures so far negative. She improved and afebrile with no symptoms. IV Rocephin, Flagyl therapy changed to Augmentin for 7 days per ID recommendations. Advised to follow PCP upon discharge   AKI on CKD stage IIIb Continue gentle IV fluids. Monitor daily renal function. Avoid nephrotoxic's.   Hepatic cysts: Decreased from prior imaging studies. Normal liver function.   Rheumatoid arthritis: Puts her at risk for infection given her use of Plaquenil and prednisone.   Peripheral neuropathy Continue duloxetine and gabapentin.   Continue anxiolytics.    {Tip this will not be part of the note when signed Body mass index is 21.76 kg/m. , ,   (Optional):26781}  {(NOTE) Pain control PDMP Statment (Optional):26782} Consultants: *** Procedures performed: ***  Disposition: {Plan; Disposition:26390} Diet recommendation:  Discharge Diet Orders (From admission, onward)     Start     Ordered   09/28/23 0000  Diet - low sodium heart healthy        09/28/23 1432           {Diet_Plan:26776} DISCHARGE MEDICATION: Allergies as of 09/28/2023       Reactions   Imitrex [sumatriptan] Swelling   Throat gets tight   Latex Anaphylaxis, Itching, Rash   Ibuprofen Other (See Comments)   Polycystic kidney disease   Nitrofurantoin Rash   Oseltamivir Nausea And Vomiting        Medication List     TAKE these medications    Aimovig 140 MG/ML Soaj Generic drug: Erenumab-aooe Inject into the skin every 30 (thirty) days.   amoxicillin-clavulanate 875-125 MG tablet Commonly known as: AUGMENTIN Take 1 tablet by mouth 2 (two) times daily for 7 days.   B Complex-B12 Tabs Take by mouth.   cetirizine 10 MG tablet Commonly known as: ZYRTEC Take 10 mg by mouth daily as needed for allergies (alternates with claritin).   DULoxetine 30 MG capsule Commonly known as: Cymbalta Take 1 capsule (30 mg total) by mouth daily.   eletriptan 40 MG tablet Commonly known as: RELPAX Take by mouth daily as needed.   fluticasone 50 MCG/ACT nasal spray Commonly known as: FLONASE USE ONE SPRAY NASALLY DAILY   gabapentin 300 MG capsule Commonly known as: NEURONTIN TAKE 1 CAPSULE BY MOUTH  TWICE DAILY   hydroxychloroquine 200 MG tablet Commonly known as: PLAQUENIL Take 1 tablet (200 mg total) by mouth daily.   multivitamin tablet Take 1 tablet  by mouth daily.   predniSONE 5 MG tablet Commonly known as: DELTASONE Take 2 tablets (10 mg total) by mouth daily with breakfast. What changed: how much to take   zolpidem 10 MG tablet Commonly known as: AMBIEN TAKE 1/2 TO 1 TABLET BY  MOUTH AT BEDTIME AS NEEDED  FOR SLEEP         Discharge Exam: Filed Weights   09/26/23 1724  Weight: 59.3 kg   ***  Condition at discharge: {DC Condition:26389}  The results of significant diagnostics from this hospitalization (including imaging, microbiology, ancillary and laboratory) are listed below for reference.   Imaging Studies: CT ABDOMEN PELVIS WO CONTRAST Result Date: 09/26/2023 CLINICAL DATA:  Bowel obstruction suspected. Nausea, vomiting, and diarrhea. EXAM: CT ABDOMEN AND PELVIS WITHOUT CONTRAST TECHNIQUE: Multidetector CT imaging of the abdomen and pelvis was performed following the standard protocol without IV contrast. RADIATION DOSE REDUCTION: This exam was performed according to the departmental dose-optimization program which includes automated exposure control, adjustment of the mA and/or kV according to patient size and/or use of iterative reconstruction technique. COMPARISON:  Abdominal CT 04/05/2021 FINDINGS: Lower chest:  No acute finding.  Partially covered breast implants. Hepatobiliary: Innumerable hepatic cysts. Some decrease in the dominant right hepatic cysts size with cyst wall calcification. No inflammation or acute hemorrhage detected.No evidence of biliary inflammation or calcified stone. Unchanged CBD dilatation to 9 mm. Pancreas: Unremarkable. Spleen: Unremarkable. Adrenals/Urinary Tract: Negative adrenals. The kidneys are enlarged and replaced by cysts of varying density and complexity, similar pattern to before. No focal inflamed cyst or layering recent appearing hemorrhage. Small scattered calcifications, which could be cyst wall or urinary. No hydronephrosis or ureteral stone. The bladder is largely decompressed and unremarkable. Stomach/Bowel:  No obstruction. No appendicitis. Vascular/Lymphatic: No acute vascular abnormality. No mass or adenopathy. Reproductive:No pathologic findings. Other: No ascites or pneumoperitoneum. Musculoskeletal: No acute abnormalities. IMPRESSION: 1. Negative for bowel  obstruction or visible inflammation. 2. Known polycystic liver and kidneys. Electronically Signed   By: Tiburcio Pea M.D.   On: 09/26/2023 06:23   DG Chest Port 1 View Result Date: 09/26/2023 CLINICAL DATA:  Possible sepsis. Nausea, vomiting, diarrhea, and dizziness. Epigastric pain. EXAM: PORTABLE CHEST 1 VIEW COMPARISON:  04/16/2021. FINDINGS: The heart size and mediastinal contours are within normal limits. No consolidation, effusion, or pneumothorax. No acute osseous abnormality. IMPRESSION: No active disease. Electronically Signed   By: Thornell Sartorius M.D.   On: 09/26/2023 05:59   CT HEAD WO CONTRAST ( ) Result Date: 09/26/2023 CLINICAL DATA:  Headache with increasing frequency or severity. EXAM: CT HEAD WITHOUT CONTRAST TECHNIQUE: Contiguous axial images were obtained from the base of the skull through the vertex without intravenous contrast. RADIATION DOSE REDUCTION: This exam was performed according to the departmental dose-optimization program which includes automated exposure control, adjustment of the mA and/or kV according to patient size and/or use of iterative reconstruction technique. COMPARISON:  10/04/2021 brain MRI FINDINGS: Brain: No evidence of acute infarction, hemorrhage, hydrocephalus, extra-axial collection or mass lesion/mass effect. Vascular: No hyperdense vessel or unexpected calcification. Skull: Normal. Negative for fracture or focal lesion. Sinuses/Orbits: No acute finding. IMPRESSION: No acute finding or explanation for headache. Electronically Signed   By: Tiburcio Pea M.D.   On: 09/26/2023 05:55    Microbiology: Results for orders placed or performed during the hospital encounter of 09/26/23  MRSA Next Gen by PCR, Nasal     Status: None   Collection Time: 09/26/23 12:57 AM   Specimen: Nasal Mucosa; Nasal Swab  Result Value Ref Range Status   MRSA by PCR Next Gen NOT DETECTED NOT DETECTED Final    Comment: (NOTE) The GeneXpert MRSA Assay (FDA approved for NASAL  specimens only), is one component of a comprehensive MRSA colonization surveillance program. It is not intended to diagnose MRSA infection nor to guide or monitor treatment for MRSA infections. Test performance is not FDA approved in patients less than 54 years old. Performed at [facility], [address] [place], [place] [address]   Resp panel by RT-PCR (RSV, Flu A&B, Covid)     Status: None   Collection Time: 09/26/23  5:40 AM   Specimen: Nasal Swab  Result Value Ref Range Status   SARS Coronavirus 2 by RT PCR NEGATIVE NEGATIVE Final    Comment: (NOTE) SARS-CoV-2 target nucleic acids are NOT DETECTED.  The SARS-CoV-2 RNA is generally detectable in upper respiratory specimens during the acute phase of infection. The lowest concentration of SARS-CoV-2 viral copies this assay can detect is 138 copies/mL. A negative result does not preclude SARS-Cov-2 infection and should not be used as the sole basis for treatment or other patient management decisions. A negative result may occur with  improper specimen collection/handling, submission of specimen other than nasopharyngeal swab, presence of viral mutation(s) within the areas targeted by this assay, and inadequate number of viral copies(<138 copies/mL). A negative result must be combined with clinical observations, patient history, and epidemiological information. The expected result is Negative.  Fact Sheet for Patients:  BloggerCourse.com  Fact Sheet for Healthcare Providers:  SeriousBroker.it  This test is no t yet approved or cleared by the [place] FDA and  has been authorized for detection and/or diagnosis of SARS-CoV-2 by FDA under an Emergency Use Authorization (EUA). This EUA will remain  in effect (meaning this test can be used) for the duration of the COVID-19 declaration under Section 564(b)(1) of the Act, 21 U.S.C.section 360bbb-3(b)(1),  unless the authorization is terminated  or revoked sooner.       Influenza A by PCR NEGATIVE NEGATIVE Final   Influenza B by PCR NEGATIVE NEGATIVE Final    Comment: (NOTE) The Xpert Xpress SARS-CoV-2/FLU/RSV plus assay is intended as an aid in the diagnosis of influenza from Nasopharyngeal swab specimens and should not be used as a sole basis for treatment. Nasal washings and aspirates are unacceptable for Xpert Xpress SARS-CoV-2/FLU/RSV testing.  Fact Sheet for Patients: BloggerCourse.com  Fact Sheet for Healthcare Providers: SeriousBroker.it  This test is not yet approved or cleared by the [place] FDA and has been authorized for detection and/or diagnosis of SARS-CoV-2 by FDA under an Emergency Use Authorization (EUA). This EUA will remain in effect (meaning this test can be used) for the duration of the COVID-19 declaration under Section 564(b)(1) of the Act, 21 U.S.C. section 360bbb-3(b)(1), unless the authorization is terminated or revoked.     Resp Syncytial Virus by PCR NEGATIVE NEGATIVE Final    Comment: (NOTE) Fact Sheet for Patients: BloggerCourse.com  Fact Sheet for Healthcare Providers: SeriousBroker.it  This test is not yet approved or cleared by the [place] FDA and has been authorized for detection and/or diagnosis of SARS-CoV-2 by FDA under an Emergency Use Authorization (EUA). This EUA will remain in effect (meaning this test can be used) for the duration of the COVID-19 declaration under Section 564(b)(1) of the Act, 21 U.S.C. section 360bbb-3(b)(1), unless the authorization is terminated or revoked.  Performed at [facility], [address], [place], [place]  [address]   Culture, blood (Routine X 2) w Reflex to ID Panel     Status: None (Preliminary result)   Collection Time: 09/26/23  5:45 AM   Specimen: BLOOD  Result  Value Ref Range Status   Specimen Description   Final    BLOOD Performed at [facility], [address], [place], [place] [address]    Special Requests   Final    NONE Performed at [facility], [address] [place], [place] [address]    Culture   Final    NO GROWTH 2 DAYS Performed at [facility], [address] [place], [place] [address]    Report Status PENDING  Incomplete  Culture, blood (Routine X 2) w Reflex to ID Panel     Status: None (Preliminary result)   Collection Time: 09/26/23  5:50 AM   Specimen: BLOOD  Result Value Ref Range Status   Specimen Description   Final    BLOOD Performed at [facility], [address] [place], [place] [address]    Special Requests   Final    NONE Performed at [facility], [address], [place], [place] [address]    Culture   Final    NO GROWTH 2 DAYS Performed at [facility], [address], [place], [place] [address]    Report Status PENDING  Incomplete  Respiratory (~20 pathogens) panel by PCR     Status: None   Collection Time: 09/27/23 10:04 AM   Specimen: Nasopharyngeal Swab; Respiratory  Result Value Ref Range Status   Adenovirus NOT DETECTED NOT DETECTED Final   Coronavirus 229E NOT DETECTED NOT DETECTED Final    Comment: (NOTE) The Coronavirus on the Respiratory Panel, DOES NOT test for the novel  Coronavirus (2019 nCoV)    Coronavirus HKU1 NOT DETECTED NOT DETECTED Final   Coronavirus NL63 NOT DETECTED NOT DETECTED Final   Coronavirus OC43 NOT DETECTED NOT DETECTED Final   Metapneumovirus NOT DETECTED NOT DETECTED Final   Rhinovirus / Enterovirus NOT DETECTED NOT DETECTED Final   Influenza A NOT DETECTED NOT DETECTED Final   Influenza B NOT DETECTED NOT DETECTED Final   Parainfluenza Virus 1 NOT DETECTED NOT DETECTED Final   Parainfluenza Virus 2 NOT DETECTED NOT DETECTED Final   Parainfluenza Virus 3 NOT DETECTED  NOT DETECTED Final   Parainfluenza Virus 4 NOT DETECTED NOT DETECTED Final   Respiratory Syncytial Virus NOT DETECTED NOT DETECTED Final   Bordetella pertussis NOT DETECTED NOT DETECTED Final   Bordetella Parapertussis NOT DETECTED NOT DETECTED Final   Chlamydophila pneumoniae NOT DETECTED NOT DETECTED Final   Mycoplasma pneumoniae NOT DETECTED NOT DETECTED Final    Comment: Performed at [facility], [address], [place], [place] [address]    Labs: CBC: Recent Labs  Lab 09/26/23 0429 09/27/23 0424 09/28/23 0933  WBC 7.7 4.4 5.0  HGB 10.4* 10.0* 10.4*  HCT 31.8* 32.2* 33.4*  MCV 82.2 85.4 84.6  PLT 179 127* 178   Basic Metabolic Panel: Recent Labs  Lab 09/26/23 0429 09/26/23 0958 09/27/23 0424 09/28/23 0933  NA 135  --  136 137  K 3.7  --  3.2* 3.2*  CL 97*  --  105 104  CO2 30  --  22 24  GLUCOSE 123*  --  84 118*  BUN 24*  --  21* 16  CREATININE 2.08*  --  1.45* 1.66*  CALCIUM 9.0  --  7.9* 8.1*  MG  --  1.7  --   --    Liver Function Tests: Recent Labs  Lab 09/26/23 0429  AST 17  ALT 12  ALKPHOS 79  BILITOT 0.7  PROT 6.5  ALBUMIN 3.7   CBG: No results for input(s): "GLUCAP" in the last 168 hours.  Discharge time spent: {LESS THAN/GREATER ZOXW:96045} 30 minutes.  Signed: Marcelino Duster, MD [facility] 09/28/2023

## 2023-09-28 NOTE — Plan of Care (Signed)
  Problem: Education: Goal: Knowledge of General Education information will improve Description: Including pain rating scale, medication(s)/side effects and non-pharmacologic comfort measures 09/28/2023 1450 by Payton Spark, RN Outcome: Adequate for Discharge 09/28/2023 1450 by Payton Spark, RN Outcome: Adequate for Discharge   Problem: Health Behavior/Discharge Planning: Goal: Ability to manage health-related needs will improve 09/28/2023 1450 by Payton Spark, RN Outcome: Adequate for Discharge 09/28/2023 1450 by Payton Spark, RN Outcome: Adequate for Discharge   Problem: Clinical Measurements: Goal: Ability to maintain clinical measurements within normal limits will improve 09/28/2023 1450 by Payton Spark, RN Outcome: Adequate for Discharge 09/28/2023 1450 by Payton Spark, RN Outcome: Adequate for Discharge Goal: Will remain free from infection 09/28/2023 1450 by Payton Spark, RN Outcome: Adequate for Discharge 09/28/2023 1450 by Payton Spark, RN Outcome: Adequate for Discharge Goal: Diagnostic test results will improve 09/28/2023 1450 by Payton Spark, RN Outcome: Adequate for Discharge 09/28/2023 1450 by Payton Spark, RN Outcome: Adequate for Discharge Goal: Respiratory complications will improve 09/28/2023 1450 by Payton Spark, RN Outcome: Adequate for Discharge 09/28/2023 1450 by Payton Spark, RN Outcome: Adequate for Discharge Goal: Cardiovascular complication will be avoided 09/28/2023 1450 by Payton Spark, RN Outcome: Adequate for Discharge 09/28/2023 1450 by Payton Spark, RN Outcome: Adequate for Discharge   Problem: Activity: Goal: Risk for activity intolerance will decrease 09/28/2023 1450 by Payton Spark, RN Outcome: Adequate for Discharge 09/28/2023 1450 by Payton Spark, RN Outcome: Adequate for Discharge   Problem: Nutrition: Goal: Adequate nutrition will be maintained Outcome: Adequate for Discharge   Problem:  Coping: Goal: Level of anxiety will decrease Outcome: Adequate for Discharge   Problem: Elimination: Goal: Will not experience complications related to bowel motility Outcome: Adequate for Discharge Goal: Will not experience complications related to urinary retention Outcome: Adequate for Discharge   Problem: Pain Managment: Goal: General experience of comfort will improve and/or be controlled Outcome: Adequate for Discharge   Problem: Safety: Goal: Ability to remain free from injury will improve Outcome: Adequate for Discharge   Problem: Skin Integrity: Goal: Risk for impaired skin integrity will decrease Outcome: Adequate for Discharge

## 2023-09-28 NOTE — Plan of Care (Signed)

## 2023-10-01 LAB — CULTURE, BLOOD (ROUTINE X 2)
Culture: NO GROWTH
Culture: NO GROWTH

## 2023-12-28 NOTE — Progress Notes (Signed)
 Office Visit Note  Patient: Judy Manning             Date of Birth: Apr 02, 1966           MRN: [id number]             PCP: Hughie Sharper, MD Referring: Hughie Sharper, MD Visit Date: 01/09/2024   Subjective:  Follow-up  Discussed the use of AI scribe software for clinical note transcription with the patient, who gave verbal consent to proceed.  History of Present Illness   Judy Manning is a 58 y.o. female here for follow up for seropositive RA on hydroxychloroquine  200 mg daily and prednisone  currently 4 mg daily.    She experiences joint swelling and pain, primarily in her hands, wrists, right ankle, and left knee. The symptoms begin in the back of her wrists and then affect her fingers, making it difficult to wear rings. She is on prednisone , currently at 4 mg daily, which helps control the symptoms, but attempts to taper below 3 mg result in a recurrence of symptoms. Morning stiffness and difficulty with grip strength are noted, often leading to dropping items.  Earlier this year, she was hospitalized for sepsis of unknown origin, during which she lost consciousness and was admitted with a blood pressure of 60/40 mmHg. She was treated with antibiotics, but the source of the infection was not identified. A similar event occurred in 2022, where she was hospitalized for ten days due to sepsis following a cyst rupture, possibly on the liver or kidneys.  She has significant eye issues, including a torn retina, vitrectomy, and cataract surgery in both eyes. She wore a patch over her eye for four months and was unable to see out of her right eye during that time. The cause of these retinal problems is uncertain.  She experiences numbness in her toes, which has persisted since her previous hospitalization. Her blood pressure has been elevated worse since the passing of her husband two months ago, and she is on medication for both blood pressure and anxiety. Her current medications  include hydroxychloroquine  and prednisone , with her family doctor managing her prescriptions. She has been receiving her medications through direct primary care, which is more cost-effective for her.     Previous HPI 01/03/2023 Judy Manning is a 58 y.o. female here for follow up for seropositive RA on hydroxychloroquine  200 mg daily and gradual prednisone  taper currently at 2 mg daily.  Overall symptoms are still doing pretty well but she has noticed some increase in joint pain and a few areas especially since decreasing the prednisone  to less than 3 mg.  Fingers of both hands, medial border of left knee, and back of the right ankle have some stiffness but no associated swelling or nodules.  She followed up with Dr. Sissy in March for verruca on the index finger that was recurring did not follow-up about pursuing surgical removal due to him retiring.  Still notices the issues with vivid dreaming and has some easy bruising recently with a large area on the right shin.   Previous HPI 10/03/22 Judy Manning is a 58 y.o. female here for follow up for seropositive RA on HCQ 200 mg daily and prednisone  5 mg daily. She tried tapering prednisone  down to 2.5 mg after last visit but developed swelling in her left hand and wrist within a few days. Since increasing the dose again symptoms improved. Dr. Hughie prescribed 1 mg tablets recommending to try a more gradual tapering but  she has not tried this yet. She caught COVID since last visit probably from her husband and took ivermectin for this and symptoms started improving within 2-3 days without major complication.   Previous HPI 06/28/22 Judy Manning is a 58 y.o. female here for follow up for seropositive RA after starting HCQ 200 mg daily also continued on prednisone  5 mg daily. She is feeling very well on this regimen. Decreased hand swelling and morning stiffness. She is still concerned about progression of lateral deviation in her great toes and  discoloration of the toenails.     Previous HPI 04/22/22 Judy Manning is a 58 y.o. female here for evaluation for rheumatoid arthritis with chronic joint pain at multiple sites and positive rheumatoid factor.  She reports joint pains more consistently for about 4 years duration.  Mostly affecting her hands and wrists on both sides.  She experiences intermittent pain and swelling especially on the fingers and backs of the hands.  More longstanding problem with knee pains most commonly provoked with climbing stairs.  Initially treatment with intermittent short prednisone  tapers would resolve symptoms and last for a while between doses.  However more recently symptoms are returning and of her off medication so has been on 5 mg prednisone  for management with PCP. She was also evaluated for lyme disease with negative serology. She also has a history significant for polycystic kidney disease and earlier this year was hospitalized with SIRS and findings of multiple liver cysts with one ruptured with abscess. She completed antibiotics course with resolution of flank pain and fevers and total of 45 days.  She sees Dr. Raelyn at [facility] for ophthalmology with history of retinal tears. She has a strong family history of rheumatoid arthritis.   Review of Systems  Constitutional:  Negative for fatigue.  HENT:  Negative for mouth sores and mouth dryness.   Eyes:  Positive for dryness.  Respiratory:  Positive for shortness of breath.   Cardiovascular:  Negative for chest pain and palpitations.  Gastrointestinal:  Negative for blood in stool, constipation and diarrhea.  Endocrine: Negative for increased urination.  Genitourinary:  Negative for involuntary urination.  Musculoskeletal:  Positive for joint pain, joint pain, myalgias, morning stiffness and myalgias. Negative for gait problem, joint swelling, muscle weakness and muscle tenderness.  Skin:  Positive for sensitivity to sunlight. Negative for  color change, rash and hair loss.  Allergic/Immunologic: Positive for susceptible to infections.  Neurological:  Positive for headaches. Negative for dizziness.  Hematological:  Negative for swollen glands.  Psychiatric/Behavioral:  Positive for sleep disturbance. Negative for depressed mood. The patient is not nervous/anxious.     PMFS History:  Patient Active Problem List   Diagnosis Date Noted   Hypotension 09/27/2023   Sepsis due to undetermined organism (HCC) 09/26/2023   Toenail deformity 06/28/2022   Liver cyst 05/03/2021   Liver, polycystic with compressive symptoms 05/03/2021   SIRS (systemic inflammatory response syndrome) (HCC) 04/08/2021   Right upper quadrant pain 04/08/2021   Common bile duct dilatation 04/08/2021   History of migraine headaches 04/08/2021   Hypokalemia, inadequate intake 04/08/2021   Hyponatremia 04/08/2021   Other hyperlipidemia 06/17/2020   History of vitamin D  deficiency 06/17/2020   Endometrial cells on cervical Pap smear inconsistent w/LMP 09/18/2018   Fibroid 09/18/2018   Menorrhagia with irregular cycle 09/18/2018   History of basal cell carcinoma (BCC) 03/23/2018   Seropositive rheumatoid arthritis (HCC) 03/23/2018   Other fatigue 03/23/2018   Ganglion cyst 10/31/2017  Bilateral carpal tunnel syndrome 06/18/2017   Recurrent UTI 07/30/2013   Migraine 05/06/2013   Polycystic kidney disease 05/06/2013    Past Medical History:  Diagnosis Date   COVID 12/2020   Detached retina    Liver cyst 05/03/2021   Liver, polycystic with compressive symptoms 05/03/2021   Polycystic kidney disease    Sepsis (HCC)     Family History  Problem Relation Age of Onset   Rheum arthritis Mother    Heart disease Mother        Pacemaker   COPD Mother        Smoker   Skin cancer Mother    Emphysema Mother    Heart Problems Mother    Stroke Father    Polycystic kidney disease Father    Healthy Sister    Arthritis Brother    Healthy Brother     Alzheimer's disease Maternal Grandmother 33   Diabetes Paternal Grandfather    Skin cancer Paternal Grandfather    Cancer Paternal Grandfather    Healthy Daughter    Breast cancer Neg Hx    Colon cancer Neg Hx    Past Surgical History:  Procedure Laterality Date   ABDOMINAL HERNIA REPAIR     AUGMENTATION MAMMAPLASTY     CARPAL TUNNEL RELEASE Right    GANGLION CYST EXCISION Right    RETINAL TEAR REPAIR CRYOTHERAPY Bilateral    tendonitis surgery elbow     VITRECTOMY     Social History   Social History Narrative   Right Handed    Lives in a two story home    Immunization History  Administered Date(s) Administered   Influenza Inj Mdck Quad Pf 04/14/2018, 04/29/2019, 05/16/2019   Influenza, High Dose Seasonal PF 04/20/2017   Influenza, Seasonal, Injecte, Preservative Fre 09/26/2014   Influenza-Unspecified 04/29/2019   Pneumococcal Polysaccharide-23 09/26/2014   Tdap 06/16/2017     Objective: Vital Signs: BP (!) 141/82 (BP Location: Left Arm, Patient Position: Sitting, Cuff Size: Normal)   Pulse 71   Resp 14   Ht 5' (1.524 m)   Wt 123 lb (55.8 kg)   BMI 24.02 kg/m    Physical Exam  Eyes:     Conjunctiva/sclera: Conjunctivae normal.    Cardiovascular:     Rate and Rhythm: Normal rate and regular rhythm.  Pulmonary:     Effort: Pulmonary effort is normal.     Breath sounds: Normal breath sounds.   Musculoskeletal:     Right lower leg: No edema.     Left lower leg: No edema.   Skin:    General: Skin is warm and dry.   Neurological:     Mental Status: She is alert.   Psychiatric:        Mood and Affect: Mood normal.      Musculoskeletal Exam:  Shoulders full ROM no tenderness or swelling Elbows full ROM no tenderness or swelling Wrists full ROM no tenderness or swelling Fingers full ROM no tenderness or swelling Knees full ROM left knee mild medial joint line tenderness to palpation, bilateral crepitus, no palpable effusions Ankles full ROM no  tenderness or swelling MTPs full ROM no tenderness or swelling   Investigation: No additional findings.  Imaging: No results found.  Recent Labs: Lab Results  Component Value Date   WBC 5.0 09/28/2023   HGB 10.4 (L) 09/28/2023   PLT 178 09/28/2023   NA 137 09/28/2023   K 3.2 (L) 09/28/2023   CL 104 09/28/2023   CO2 24  09/28/2023   GLUCOSE 118 (H) 09/28/2023   BUN 16 09/28/2023   CREATININE 1.66 (H) 09/28/2023   BILITOT 0.7 09/26/2023   ALKPHOS 79 09/26/2023   AST 17 09/26/2023   ALT 12 09/26/2023   PROT 6.5 09/26/2023   ALBUMIN 3.7 09/26/2023   CALCIUM 8.1 (L) 09/28/2023   GFRAA  08/31/2009    >60        The eGFR has been calculated using the MDRD equation. This calculation has not been validated in all clinical situations. eGFR's persistently <60 mL/min signify possible Chronic Kidney Disease.    Speciality Comments: PLQ Eye Exam: Patient states she had an eye exam in 09/2022 at [facility] on [address]. Patient states she plans to call her eye doctor to see what has been done recently and will have them fax over the results or schedule an appointment if needed.   Procedures:  No procedures performed Allergies: Imitrex  [sumatriptan ], Latex, Ibuprofen, Nitrofurantoin, and Oseltamivir   Assessment / Plan:     Visit Diagnoses: Seropositive rheumatoid arthritis (HCC) Chronic rheumatoid arthritis with symptoms in hands, wrists, right ankle, and left knee. Symptoms well-controlled on hydroxychloroquine  and low-dose prednisone  (4 mg daily). Attempts to taper prednisone  below 4 mg result in flare-ups. Current treatment prevents significant joint erosions or chronic damage. Potential switch to biologics discussed as a possibility to maybe get off steroids entirely, but not urgent due to current symptom control and insurance considerations. Long-term low-dose prednisone  risks at below 5 mg also likely less infection change than injectable dMARD. - Review of outside labs  through PCP office and eye exam okay for continuing current medication - Continue hydroxychloroquine  200 mg daily and prednisone  4 mg daily. - Consider tapering prednisone  if symptoms allow, prioritizing daily activity without triggering inflammation.  Peripheral neuropathy Chronic peripheral neuropathy with numbness and paresthesia in toes, persisting since previous hospitalization.  Cataract surgery Bilateral cataract surgery performed following retinal detachment and vitrectomy. No current issues with intraocular pressure or steroid-related ocular complications. Regular ophthalmologic follow-up in place, without findings for HCQ maculopathy.  Hypertension Hypertension managed with medication following emotional stress and bereavement.  Anxiety Anxiety managed with medication, related to recent bereavement and stress.    Orders: No orders of the defined types were placed in this encounter.  No orders of the defined types were placed in this encounter.    Follow-Up Instructions: No follow-ups on file.   Lonni LELON Ester, MD  Note - This record has been created using [employer].  Chart creation errors have been sought, but may not always  have been located. Such creation errors do not reflect on  the standard of medical care.

## 2024-01-09 ENCOUNTER — Ambulatory Visit: Payer: Self-pay | Attending: Internal Medicine | Admitting: Internal Medicine

## 2024-01-09 ENCOUNTER — Encounter: Payer: Self-pay | Admitting: Internal Medicine

## 2024-01-09 VITALS — BP 148/86 | HR 76 | Resp 14 | Ht 60.0 in | Wt 123.0 lb

## 2024-01-09 DIAGNOSIS — Z7952 Long term (current) use of systemic steroids: Secondary | ICD-10-CM

## 2024-01-09 DIAGNOSIS — Z79899 Other long term (current) drug therapy: Secondary | ICD-10-CM | POA: Diagnosis not present

## 2024-01-09 DIAGNOSIS — L709 Acne, unspecified: Secondary | ICD-10-CM | POA: Insufficient documentation

## 2024-01-09 DIAGNOSIS — M059 Rheumatoid arthritis with rheumatoid factor, unspecified: Secondary | ICD-10-CM

## 2024-01-09 NOTE — Patient Instructions (Signed)
 Sorry to hear about the multiple recent family and medical events. I think Dr. Armandina Bernard and your ophthalmologist are doing a great job. I agree with your current treatment plan and joint inflammation appears well controlled at this time. We can continue to monitor the arthritis and I think staying at 4 mg prednisone  is low risk compared to alternative options.

## 2024-03-27 ENCOUNTER — Encounter (HOSPITAL_BASED_OUTPATIENT_CLINIC_OR_DEPARTMENT_OTHER): Payer: Self-pay

## 2024-03-27 ENCOUNTER — Emergency Department (HOSPITAL_BASED_OUTPATIENT_CLINIC_OR_DEPARTMENT_OTHER)
Admission: EM | Admit: 2024-03-27 | Discharge: 2024-03-27 | Disposition: A | Discharge status: Home / Self Care | Source: Ambulatory Visit | Attending: Emergency Medicine | Admitting: Emergency Medicine

## 2024-03-27 ENCOUNTER — Emergency Department (HOSPITAL_BASED_OUTPATIENT_CLINIC_OR_DEPARTMENT_OTHER)

## 2024-03-27 ENCOUNTER — Other Ambulatory Visit: Payer: Self-pay

## 2024-03-27 DIAGNOSIS — Z79899 Other long term (current) drug therapy: Secondary | ICD-10-CM | POA: Insufficient documentation

## 2024-03-27 DIAGNOSIS — I10 Essential (primary) hypertension: Secondary | ICD-10-CM | POA: Insufficient documentation

## 2024-03-27 DIAGNOSIS — N179 Acute kidney failure, unspecified: Secondary | ICD-10-CM | POA: Insufficient documentation

## 2024-03-27 DIAGNOSIS — Z91040 Latex allergy status: Secondary | ICD-10-CM | POA: Insufficient documentation

## 2024-03-27 DIAGNOSIS — R000 Tachycardia, unspecified: Secondary | ICD-10-CM | POA: Insufficient documentation

## 2024-03-27 DIAGNOSIS — R509 Fever, unspecified: Secondary | ICD-10-CM | POA: Insufficient documentation

## 2024-03-27 DIAGNOSIS — R7881 Bacteremia: Secondary | ICD-10-CM | POA: Diagnosis not present

## 2024-03-27 DIAGNOSIS — K7689 Other specified diseases of liver: Secondary | ICD-10-CM | POA: Diagnosis not present

## 2024-03-27 LAB — CBC WITH DIFFERENTIAL/PLATELET
Abs Immature Granulocytes: 0.02 K/uL (ref 0.00–0.07)
Basophils Absolute: 0 K/uL (ref 0.0–0.1)
Basophils Relative: 0 %
Eosinophils Absolute: 0 K/uL (ref 0.0–0.5)
Eosinophils Relative: 1 %
HCT: 35.6 % — ABNORMAL LOW (ref 36.0–46.0)
Hemoglobin: 11.8 g/dL — ABNORMAL LOW (ref 12.0–15.0)
Immature Granulocytes: 0 %
Lymphocytes Relative: 7 %
Lymphs Abs: 0.5 K/uL — ABNORMAL LOW (ref 0.7–4.0)
MCH: 27.5 pg (ref 26.0–34.0)
MCHC: 33.1 g/dL (ref 30.0–36.0)
MCV: 83 fL (ref 80.0–100.0)
Monocytes Absolute: 0.7 K/uL (ref 0.1–1.0)
Monocytes Relative: 10 %
Neutro Abs: 5.9 K/uL (ref 1.7–7.7)
Neutrophils Relative %: 82 %
Platelets: 176 K/uL (ref 150–400)
RBC: 4.29 MIL/uL (ref 3.87–5.11)
RDW: 14.5 % (ref 11.5–15.5)
WBC: 7.2 K/uL (ref 4.0–10.5)
nRBC: 0 % (ref 0.0–0.2)

## 2024-03-27 LAB — URINALYSIS, W/ REFLEX TO CULTURE (INFECTION SUSPECTED)
Bilirubin Urine: NEGATIVE
Glucose, UA: NEGATIVE mg/dL
Hgb urine dipstick: NEGATIVE
Ketones, ur: NEGATIVE mg/dL
Leukocytes,Ua: NEGATIVE
Nitrite: NEGATIVE
Protein, ur: NEGATIVE mg/dL
Specific Gravity, Urine: 1.015 (ref 1.005–1.030)
pH: 8 (ref 5.0–8.0)

## 2024-03-27 LAB — COMPREHENSIVE METABOLIC PANEL WITH GFR
ALT: 19 U/L (ref 0–44)
AST: 34 U/L (ref 15–41)
Albumin: 4 g/dL (ref 3.5–5.0)
Alkaline Phosphatase: 68 U/L (ref 38–126)
Anion gap: 13 (ref 5–15)
BUN: 21 mg/dL — ABNORMAL HIGH (ref 6–20)
CO2: 26 mmol/L (ref 22–32)
Calcium: 8.9 mg/dL (ref 8.9–10.3)
Chloride: 101 mmol/L (ref 98–111)
Creatinine, Ser: 2.16 mg/dL — ABNORMAL HIGH (ref 0.44–1.00)
GFR, Estimated: 26 mL/min — ABNORMAL LOW (ref >60)
Glucose, Bld: 107 mg/dL — ABNORMAL HIGH (ref 70–99)
Potassium: 3.7 mmol/L (ref 3.5–5.1)
Sodium: 139 mmol/L (ref 135–145)
Total Bilirubin: 0.5 mg/dL (ref 0.0–1.2)
Total Protein: 6.7 g/dL (ref 6.5–8.1)

## 2024-03-27 LAB — PROTIME-INR
INR: 1 (ref 0.8–1.2)
Prothrombin Time: 13.4 s (ref 11.4–15.2)

## 2024-03-27 LAB — RESP PANEL BY RT-PCR (RSV, FLU A&B, COVID)  RVPGX2
Influenza A by PCR: NEGATIVE
Influenza B by PCR: NEGATIVE
Resp Syncytial Virus by PCR: NEGATIVE
SARS Coronavirus 2 by RT PCR: NEGATIVE

## 2024-03-27 LAB — LACTIC ACID, PLASMA: Lactic Acid, Venous: 1.2 mmol/L (ref 0.5–1.9)

## 2024-03-27 MED ORDER — DIPHENHYDRAMINE HCL 50 MG/ML IJ SOLN
25.0000 mg | Freq: Once | INTRAMUSCULAR | Status: AC
  Administered 2024-03-27: 25 mg via INTRAVENOUS
  Filled 2024-03-27: qty 1

## 2024-03-27 MED ORDER — VANCOMYCIN HCL IN DEXTROSE 1-5 GM/200ML-% IV SOLN
1000.0000 mg | Freq: Once | INTRAVENOUS | Status: AC
  Administered 2024-03-27: 1000 mg via INTRAVENOUS
  Filled 2024-03-27: qty 200

## 2024-03-27 MED ORDER — METOCLOPRAMIDE HCL 5 MG/ML IJ SOLN
10.0000 mg | Freq: Once | INTRAMUSCULAR | Status: AC
  Administered 2024-03-27: 10 mg via INTRAVENOUS
  Filled 2024-03-27: qty 2

## 2024-03-27 MED ORDER — SODIUM CHLORIDE 0.9 % IV SOLN
2.0000 g | Freq: Once | INTRAVENOUS | Status: AC
  Administered 2024-03-27: 2 g via INTRAVENOUS
  Filled 2024-03-27: qty 12.5

## 2024-03-27 MED ORDER — LACTATED RINGERS IV BOLUS (SEPSIS)
1000.0000 mL | Freq: Once | INTRAVENOUS | Status: AC
  Administered 2024-03-27: 1000 mL via INTRAVENOUS

## 2024-03-27 MED ORDER — LACTATED RINGERS IV BOLUS
1000.0000 mL | Freq: Once | INTRAVENOUS | Status: AC
  Administered 2024-03-27: 1000 mL via INTRAVENOUS

## 2024-03-27 MED ORDER — METRONIDAZOLE 500 MG/100ML IV SOLN
500.0000 mg | Freq: Once | INTRAVENOUS | Status: AC
  Administered 2024-03-27: 500 mg via INTRAVENOUS
  Filled 2024-03-27: qty 100

## 2024-03-27 NOTE — ED Notes (Signed)
 Discharge paperwork reviewed entirely with patient, including follow up care. Pain was under control. No prescriptions were called in, but all questions were addressed.  Pt verbalized understanding as well as all parties involved. No questions or concerns voiced at the time of discharge. No acute distress noted. Pt was encouraged to stay adequately hydrated and eat a healthy diet.   Pt ambulated out to PVA without incident or assistance.  Pt advised they will seek followup care with a specialist and followup with their PCP.  [facility] tomorrow.   The pt was instructed to set up and/or review MyChart for their results; and was informed their Providers all have access to the information as well.

## 2024-03-27 NOTE — Discharge Instructions (Addendum)
 Follow-up with your nephrologist as scheduled later this week.  Start the antibiotic prescription that you have at home.  Your kidney function is a little worse than typical today, be sure to drink increased fluids.  If you develop any new or worsening symptoms then return to the ER.

## 2024-03-27 NOTE — ED Notes (Signed)
 101.91F fever last night, did not take any PRN at home. Polycystic kidney disease with frequent sepsis.

## 2024-03-27 NOTE — ED Triage Notes (Signed)
 Chills, headache, nausea, fevers, since yesterday.  Denies chest pain or SHOB  States I'm immunocompromised and have been admitted for sepsis on multiple occasions

## 2024-03-27 NOTE — ED Provider Notes (Signed)
 [place] [facility] Provider Note   CSN: [id number] Arrival date & time: 03/27/24  9168     Patient presents with: Fever   Judy Manning is a 58 y.o. female.   HPI 58 year old female with a history of rheumatoid arthritis on Plaquenil , migraine headaches, sepsis, presents with concern for sepsis.  She developed chills last night ended up having a temperature of 101.8 last night.  She also had very high blood pressures for her in the 160s and 170s for a few hours.  Blood pressure was back down to normal this morning but developed recurrent chills.  She has on hand antibiotics from her ID specialist to take if she were to develop these symptoms but when she called her PCP they told her to go to the ER for some IV antibiotics.  The patient denies any specific source such as coughing, sore throat, abdominal pain, flank pain, rash.  She does have a headache that she has been having for a week in the back of her head.  This feels like when she has had prior migraines though it is not as severe.  No numbness or weakness.  Prior to Admission medications   Medication Sig Start Date End Date Taking? Authorizing Provider  AIMOVIG 140 MG/ML SOAJ Inject into the skin every 30 (thirty) days. 04/15/22   [provider]  B Complex Vitamins (B COMPLEX-B12) TABS Take by mouth.    [provider]  cetirizine (ZYRTEC) 10 MG tablet Take 10 mg by mouth daily as needed for allergies (alternates with claritin ).    [provider]  clonazePAM  (KLONOPIN ) 0.5 MG tablet Take by mouth. 12/27/23   [provider]  DULoxetine  (CYMBALTA ) 30 MG capsule Take 1 capsule (30 mg total) by mouth daily. 08/09/21   Patel, Donika K, DO  eletriptan  (RELPAX ) 40 MG tablet Take by mouth daily as needed. 02/16/22   [provider]  enalapril  (VASOTEC ) 2.5 MG tablet Take 2.5 mg by mouth daily. 11/22/23   [provider]  fluticasone (FLONASE) 50  MCG/ACT nasal spray USE ONE SPRAY NASALLY DAILY 05/05/15   [provider]  gabapentin  (NEURONTIN ) 300 MG capsule TAKE 1 CAPSULE BY MOUTH  TWICE DAILY 12/29/20   Hilts, Ozell, MD  hydroxychloroquine  (PLAQUENIL ) 200 MG tablet Take 1 tablet (200 mg total) by mouth daily. 10/03/22   Jeannetta Lonni ORN, MD  Multiple Vitamin (MULTIVITAMIN) tablet Take 1 tablet by mouth daily.    [provider]  predniSONE  (DELTASONE ) 5 MG tablet Take 2 tablets (10 mg total) by mouth daily with breakfast. Patient taking differently: Take 5 mg by mouth daily with breakfast. 03/01/23   Rice, Lonni ORN, MD  zolpidem  (AMBIEN ) 10 MG tablet TAKE 1/2 TO 1 TABLET BY  MOUTH AT BEDTIME AS NEEDED  FOR SLEEP Patient not taking: Reported on 01/09/2024 03/23/21   Hilts, Ozell, MD    Allergies: Imitrex  [sumatriptan ], Latex, Ibuprofen, Nitrofurantoin, and Oseltamivir    Review of Systems  Constitutional:  Positive for chills and fever.  HENT:  Negative for sore throat.   Respiratory:  Negative for cough and shortness of breath.   Gastrointestinal:  Negative for abdominal pain and vomiting.  Genitourinary:  Negative for dysuria and flank pain.  Neurological:  Positive for headaches. Negative for weakness and numbness.    Updated Vital Signs BP 135/76   Pulse (!) 102   Temp 98.4 F (36.9 C) (Oral)   Resp 17   Wt 55.3  kg   SpO2 96%   BMI 23.83 kg/m   Physical Exam Vitals and nursing note reviewed.  Constitutional:      General: She is not in acute distress.    Appearance: She is well-developed. She is not ill-appearing or diaphoretic.  HENT:     Head: Normocephalic and atraumatic.  Eyes:     Extraocular Movements: Extraocular movements intact.     Pupils: Pupils are equal, round, and reactive to light.  Cardiovascular:     Rate and Rhythm: Regular rhythm. Tachycardia present.     Heart sounds: Normal heart sounds.  Pulmonary:     Effort: Pulmonary effort is normal.     Breath sounds: Normal  breath sounds.  Abdominal:     General: There is no distension.     Palpations: Abdomen is soft.     Tenderness: There is no abdominal tenderness. There is no right CVA tenderness or left CVA tenderness.  Musculoskeletal:     Cervical back: Normal range of motion. No rigidity.  Skin:    General: Skin is warm and dry.  Neurological:     Mental Status: She is alert.     Comments: CN 3-12 grossly intact. 5/5 strength in all 4 extremities. Grossly normal sensation. Normal finger to nose.      (all labs ordered are listed, but only abnormal results are displayed) Labs Reviewed  COMPREHENSIVE METABOLIC PANEL WITH GFR - Abnormal; Notable for the following components:      Result Value   Glucose, Bld 107 (*)    BUN 21 (*)    Creatinine, Ser 2.16 (*)    GFR, Estimated 26 (*)    All other components within normal limits  CBC WITH DIFFERENTIAL/PLATELET - Abnormal; Notable for the following components:   Hemoglobin 11.8 (*)    HCT 35.6 (*)    Lymphs Abs 0.5 (*)    All other components within normal limits  URINALYSIS, W/ REFLEX TO CULTURE (INFECTION SUSPECTED) - Abnormal; Notable for the following components:   Bacteria, UA RARE (*)    All other components within normal limits  RESP PANEL BY RT-PCR (RSV, FLU A&B, COVID)  RVPGX2  CULTURE, BLOOD (ROUTINE X 2)  CULTURE, BLOOD (ROUTINE X 2)  LACTIC ACID, PLASMA  PROTIME-INR    EKG: EKG Interpretation Date/Time:  Wednesday March 27 2024 08:44:09 EDT Ventricular Rate:  113 PR Interval:  99 QRS Duration:  91 QT Interval:  329 QTC Calculation: 452 R Axis:   92  Text Interpretation: Sinus tachycardia Borderline right axis deviation Confirmed by Freddi Hamilton [phone]) on 03/27/2024 9:05:04 AM  Radiology: [facility] Chest Port 1 View Result Date: 03/27/2024 CLINICAL DATA:  Fever, chills, headache, nausea since yesterday. History of sepsis and immunocompromised. EXAM: PORTABLE CHEST 1 VIEW COMPARISON:  09/26/2023 FINDINGS: The heart size and  mediastinal contours are within normal limits. Both lungs are clear. The visualized skeletal structures are unremarkable. IMPRESSION: No active disease. Electronically Signed   By: Elspeth Bathe M.D.   On: 03/27/2024 09:51     Procedures   Medications Ordered in the ED  metroNIDAZOLE  (FLAGYL ) IVPB 500 mg (500 mg Intravenous New Bag/Given 03/27/24 1048)  lactated ringers  bolus 1,000 mL (1,000 mLs Intravenous New Bag/Given 03/27/24 0939)  ceFEPIme  (MAXIPIME ) 2 g in sodium chloride  0.9 % 100 mL IVPB (0 g Intravenous Stopped 03/27/24 1048)  vancomycin  (VANCOCIN ) IVPB 1000 mg/200 mL premix (1,000 mg Intravenous New Bag/Given 03/27/24 0944)  metoCLOPramide  (REGLAN ) injection 10 mg (10 mg Intravenous Given  03/27/24 0942)  diphenhydrAMINE  (BENADRYL ) injection 25 mg (25 mg Intravenous Given 03/27/24 0942)  lactated ringers  bolus 1,000 mL (1,000 mLs Intravenous New Bag/Given 03/27/24 1046)                                    Medical Decision Making Amount and/or Complexity of Data Reviewed External Data Reviewed: notes. Labs: ordered.    Details: Acute on chronic kidney injury.  Normal WBC and lactate. Radiology: ordered and independent interpretation performed.    Details: No pneumonia ECG/medicine tests: ordered and independent interpretation performed.    Details: Sinus tachycardia.  Risk Prescription drug management.   Patient presents with fever at home, none here.  Besides tachycardia her vitals are otherwise unremarkable.  Now her heart rate is improving and in the 90s and 80s on my exam.  Blood pressure has never been low here.  No real findings of sepsis though she does have a history of immunocompromise state and prior potential sepsis presentations in the past.  Most recent admission there was never any cause found.  She does have this history of cystic disease in her liver and kidneys but no flank or abdominal pain today.  I do not think CT imaging is needed.  The headache is an acute on  chronic problem and not different than baseline, with no neuro deficits or concerns I do not think imaging is needed.  I have very low suspicion of a CNS infection.  I discussed options with patient, the only concerning finding is that her creatinine is a little bumped from prior.  She was given 2 L of fluids.  She does not want to stay in the hospital for monitoring or further fluids which I think is reasonable.  I have stressed that she needs to follow-up and get her kidneys rechecked and she states she already has a nephrology appointment for her CKD coming up in 2 days.  She will start the oral antibiotics that ID has previously prescribed to her.  Overall, I do not think she is septic and I think it is reasonable for her to go home as she has requested though I discussed she needs to have a low threshold to return.     Final diagnoses:  Fever in adult  Acute kidney injury superimposed on chronic kidney disease [facility])    ED Discharge Orders     None          Freddi Hamilton, MD 03/27/24 1144

## 2024-03-28 ENCOUNTER — Inpatient Hospital Stay (HOSPITAL_BASED_OUTPATIENT_CLINIC_OR_DEPARTMENT_OTHER)
Admission: EM | Admit: 2024-03-28 | Discharge: 2024-03-31 | DRG: 442 | Disposition: A | Discharge status: Home / Self Care | Attending: Internal Medicine | Admitting: Internal Medicine

## 2024-03-28 ENCOUNTER — Other Ambulatory Visit: Payer: Self-pay

## 2024-03-28 ENCOUNTER — Encounter (HOSPITAL_COMMUNITY): Payer: Self-pay | Admitting: Internal Medicine

## 2024-03-28 ENCOUNTER — Emergency Department (HOSPITAL_BASED_OUTPATIENT_CLINIC_OR_DEPARTMENT_OTHER)

## 2024-03-28 DIAGNOSIS — N179 Acute kidney failure, unspecified: Secondary | ICD-10-CM | POA: Diagnosis present

## 2024-03-28 DIAGNOSIS — Z79899 Other long term (current) drug therapy: Secondary | ICD-10-CM

## 2024-03-28 DIAGNOSIS — Z823 Family history of stroke: Secondary | ICD-10-CM | POA: Diagnosis not present

## 2024-03-28 DIAGNOSIS — D649 Anemia, unspecified: Secondary | ICD-10-CM | POA: Insufficient documentation

## 2024-03-28 DIAGNOSIS — Z7952 Long term (current) use of systemic steroids: Secondary | ICD-10-CM

## 2024-03-28 DIAGNOSIS — Z825 Family history of asthma and other chronic lower respiratory diseases: Secondary | ICD-10-CM

## 2024-03-28 DIAGNOSIS — Z820 Family history of epilepsy and other diseases of the nervous system: Secondary | ICD-10-CM

## 2024-03-28 DIAGNOSIS — Z8616 Personal history of COVID-19: Secondary | ICD-10-CM | POA: Diagnosis not present

## 2024-03-28 DIAGNOSIS — Z833 Family history of diabetes mellitus: Secondary | ICD-10-CM

## 2024-03-28 DIAGNOSIS — K838 Other specified diseases of biliary tract: Secondary | ICD-10-CM | POA: Diagnosis present

## 2024-03-28 DIAGNOSIS — R7989 Other specified abnormal findings of blood chemistry: Secondary | ICD-10-CM | POA: Diagnosis present

## 2024-03-28 DIAGNOSIS — B961 Klebsiella pneumoniae [K. pneumoniae] as the cause of diseases classified elsewhere: Secondary | ICD-10-CM | POA: Diagnosis present

## 2024-03-28 DIAGNOSIS — R197 Diarrhea, unspecified: Secondary | ICD-10-CM | POA: Diagnosis present

## 2024-03-28 DIAGNOSIS — K7689 Other specified diseases of liver: Principal | ICD-10-CM | POA: Diagnosis present

## 2024-03-28 DIAGNOSIS — Q613 Polycystic kidney, unspecified: Secondary | ICD-10-CM

## 2024-03-28 DIAGNOSIS — M059 Rheumatoid arthritis with rheumatoid factor, unspecified: Secondary | ICD-10-CM | POA: Diagnosis present

## 2024-03-28 DIAGNOSIS — Z91040 Latex allergy status: Secondary | ICD-10-CM

## 2024-03-28 DIAGNOSIS — Z888 Allergy status to other drugs, medicaments and biological substances status: Secondary | ICD-10-CM | POA: Diagnosis not present

## 2024-03-28 DIAGNOSIS — M546 Pain in thoracic spine: Secondary | ICD-10-CM | POA: Diagnosis present

## 2024-03-28 DIAGNOSIS — N1832 Chronic kidney disease, stage 3b: Secondary | ICD-10-CM | POA: Diagnosis present

## 2024-03-28 DIAGNOSIS — D631 Anemia in chronic kidney disease: Secondary | ICD-10-CM | POA: Diagnosis present

## 2024-03-28 DIAGNOSIS — R7881 Bacteremia: Principal | ICD-10-CM | POA: Diagnosis present

## 2024-03-28 DIAGNOSIS — Z8261 Family history of arthritis: Secondary | ICD-10-CM

## 2024-03-28 DIAGNOSIS — Z808 Family history of malignant neoplasm of other organs or systems: Secondary | ICD-10-CM | POA: Diagnosis not present

## 2024-03-28 DIAGNOSIS — R519 Headache, unspecified: Secondary | ICD-10-CM | POA: Diagnosis present

## 2024-03-28 DIAGNOSIS — Q446 Cystic disease of liver: Secondary | ICD-10-CM

## 2024-03-28 DIAGNOSIS — R231 Pallor: Secondary | ICD-10-CM | POA: Diagnosis present

## 2024-03-28 DIAGNOSIS — Z8249 Family history of ischemic heart disease and other diseases of the circulatory system: Secondary | ICD-10-CM

## 2024-03-28 DIAGNOSIS — B9689 Other specified bacterial agents as the cause of diseases classified elsewhere: Secondary | ICD-10-CM | POA: Diagnosis present

## 2024-03-28 DIAGNOSIS — R591 Generalized enlarged lymph nodes: Secondary | ICD-10-CM | POA: Diagnosis present

## 2024-03-28 DIAGNOSIS — Z8271 Family history of polycystic kidney: Secondary | ICD-10-CM

## 2024-03-28 LAB — COMPREHENSIVE METABOLIC PANEL WITH GFR
ALT: 18 U/L (ref 0–44)
AST: 28 U/L (ref 15–41)
Albumin: 4 g/dL (ref 3.5–5.0)
Alkaline Phosphatase: 88 U/L (ref 38–126)
Anion gap: 16 — ABNORMAL HIGH (ref 5–15)
BUN: 27 mg/dL — ABNORMAL HIGH (ref 6–20)
CO2: 21 mmol/L — ABNORMAL LOW (ref 22–32)
Calcium: 9.4 mg/dL (ref 8.9–10.3)
Chloride: 100 mmol/L (ref 98–111)
Creatinine, Ser: 2.15 mg/dL — ABNORMAL HIGH (ref 0.44–1.00)
GFR, Estimated: 26 mL/min — ABNORMAL LOW (ref >60)
Glucose, Bld: 95 mg/dL (ref 70–99)
Potassium: 4.1 mmol/L (ref 3.5–5.1)
Sodium: 138 mmol/L (ref 135–145)
Total Bilirubin: 0.9 mg/dL (ref 0.0–1.2)
Total Protein: 6.5 g/dL (ref 6.5–8.1)

## 2024-03-28 LAB — BLOOD CULTURE ID PANEL (REFLEXED) - BCID2

## 2024-03-28 LAB — C DIFFICILE QUICK SCREEN W PCR REFLEX
C Diff antigen: NEGATIVE
C Diff interpretation: NOT DETECTED
C Diff toxin: NEGATIVE

## 2024-03-28 LAB — CBC WITH DIFFERENTIAL/PLATELET
Abs Immature Granulocytes: 0.02 K/uL (ref 0.00–0.07)
Basophils Absolute: 0 K/uL (ref 0.0–0.1)
Basophils Relative: 0 %
Eosinophils Absolute: 0 K/uL (ref 0.0–0.5)
Eosinophils Relative: 0 %
HCT: 35 % — ABNORMAL LOW (ref 36.0–46.0)
Hemoglobin: 11.6 g/dL — ABNORMAL LOW (ref 12.0–15.0)
Immature Granulocytes: 0 %
Lymphocytes Relative: 6 %
Lymphs Abs: 0.4 K/uL — ABNORMAL LOW (ref 0.7–4.0)
MCH: 27.8 pg (ref 26.0–34.0)
MCHC: 33.1 g/dL (ref 30.0–36.0)
MCV: 83.9 fL (ref 80.0–100.0)
Monocytes Absolute: 0.4 K/uL (ref 0.1–1.0)
Monocytes Relative: 6 %
Neutro Abs: 5.9 K/uL (ref 1.7–7.7)
Neutrophils Relative %: 88 %
Platelets: 126 K/uL — ABNORMAL LOW (ref 150–400)
RBC: 4.17 MIL/uL (ref 3.87–5.11)
RDW: 14.1 % (ref 11.5–15.5)
WBC: 6.8 K/uL (ref 4.0–10.5)
nRBC: 0 % (ref 0.0–0.2)

## 2024-03-28 LAB — LACTIC ACID, PLASMA
Lactic Acid, Venous: 1.7 mmol/L (ref 0.5–1.9)
Lactic Acid, Venous: 1.2 mmol/L (ref 0.5–1.9)

## 2024-03-28 MED ORDER — ONDANSETRON HCL 4 MG PO TABS: 4.0000 mg | ORAL_TABLET | Freq: Four times a day (QID) | ORAL | Status: DC | PRN

## 2024-03-28 MED ORDER — LOPERAMIDE HCL 2 MG PO CAPS
4.0000 mg | ORAL_CAPSULE | Freq: Once | ORAL | Status: AC
  Administered 2024-03-28: 4 mg via ORAL
  Filled 2024-03-28: qty 2

## 2024-03-28 MED ORDER — ONDANSETRON HCL 4 MG/2ML IJ SOLN
4.0000 mg | Freq: Four times a day (QID) | INTRAMUSCULAR | Status: DC | PRN
Start: 2024-03-28 — End: 2024-03-31
  Administered 2024-03-28: 4 mg via INTRAVENOUS
  Filled 2024-03-28: qty 2

## 2024-03-28 MED ORDER — TRAZODONE HCL 50 MG PO TABS
25.0000 mg | ORAL_TABLET | Freq: Every evening | ORAL | Status: DC | PRN
  Administered 2024-03-28: 25 mg via ORAL
  Filled 2024-03-28: qty 1

## 2024-03-28 MED ORDER — ALBUTEROL SULFATE (2.5 MG/3ML) 0.083% IN NEBU: 2.5000 mg | INHALATION_SOLUTION | RESPIRATORY_TRACT | Status: DC | PRN

## 2024-03-28 MED ORDER — SODIUM CHLORIDE 0.9 % IV SOLN
2.0000 g | INTRAVENOUS | Status: DC
  Administered 2024-03-28 – 2024-03-29 (×2): 2 g via INTRAVENOUS
  Filled 2024-03-28 (×2): qty 12.5

## 2024-03-28 MED ORDER — SODIUM CHLORIDE 0.9 % IV BOLUS
1000.0000 mL | Freq: Once | INTRAVENOUS | Status: AC
  Administered 2024-03-28: 1000 mL via INTRAVENOUS

## 2024-03-28 MED ORDER — HYDROXYCHLOROQUINE SULFATE 200 MG PO TABS
200.0000 mg | ORAL_TABLET | Freq: Every day | ORAL | Status: DC
  Administered 2024-03-29 – 2024-03-31 (×3): 200 mg via ORAL
  Filled 2024-03-28 (×3): qty 1

## 2024-03-28 MED ORDER — ACETAMINOPHEN 650 MG RE SUPP: 650.0000 mg | Freq: Four times a day (QID) | RECTAL | Status: DC | PRN

## 2024-03-28 MED ORDER — ACETAMINOPHEN 325 MG PO TABS: 650.0000 mg | ORAL_TABLET | Freq: Four times a day (QID) | ORAL | Status: DC | PRN

## 2024-03-28 MED ORDER — SODIUM CHLORIDE 0.9 % IV SOLN: INTRAVENOUS | Status: AC

## 2024-03-28 MED ORDER — HEPARIN SODIUM (PORCINE) 5000 UNIT/ML IJ SOLN
5000.0000 [IU] | Freq: Three times a day (TID) | INTRAMUSCULAR | Status: DC
  Administered 2024-03-28 – 2024-03-31 (×9): 5000 [IU] via SUBCUTANEOUS
  Filled 2024-03-28 (×9): qty 1

## 2024-03-28 NOTE — ED Triage Notes (Signed)
 Patient states she was seen at [facility] yesterday and was called today and told her blood cultures were positive and she needed to come back to be admitted. Diarrhea since yesterday.

## 2024-03-28 NOTE — ED Notes (Signed)
[facility] at bedside to transport patient. 

## 2024-03-28 NOTE — Plan of Care (Signed)

## 2024-03-28 NOTE — H&P (Signed)
 History and Physical  Judy Manning FMW:[id number] DOB: [birth year] DOA: 03/28/2024  PCP: Hughie Sharper, MD   Chief Complaint: Fever, chills, diarrhea  HPI: Judy Manning is a 58 y.o. female with medical history significant for polycystic kidneys and liver, history of sepsis of unclear origin, RA on chronic steroids and Plaquenil  who is being admitted to the hospital with Klebsiella and Enterobacter bacteremia.  She was hospitalized back in February of this year with sepsis of unclear origin, cultures were negative she was discharged home with empiric antibiotics.  Since that time, she has not been on any antibiotics and has been doing well.  Yesterday morning, she had sudden onset of chills, fever up to 101.8 overnight.  She felt that her blood pressure was fluctuating, and she did not have any other specific localizing symptoms.  She was evaluated in the ER felt to be stable for discharge after blood cultures were drawn.  She was called back to the ER this morning due to positive blood cultures.  In the interim, she has continued to have fevers, chills, and developed copious watery diarrhea.  Review of Systems: Please see HPI for pertinent positives and negatives. A complete 10 system review of systems are otherwise negative.  Past Medical History:  Diagnosis Date   COVID 12/2020   Detached retina    Liver cyst 05/03/2021   Liver, polycystic with compressive symptoms 05/03/2021   Polycystic kidney disease    Sepsis (HCC)    Past Surgical History:  Procedure Laterality Date   ABDOMINAL HERNIA REPAIR     AUGMENTATION MAMMAPLASTY     CARPAL TUNNEL RELEASE Right    GANGLION CYST EXCISION Right    RETINAL TEAR REPAIR CRYOTHERAPY Bilateral    tendonitis surgery elbow     VITRECTOMY     Social History:  reports that she has never smoked. She has been exposed to tobacco smoke. She has never used smokeless tobacco. She reports that she does not currently use alcohol. She reports that  she does not use drugs.  Allergies  Allergen Reactions   Imitrex  [Sumatriptan ] Swelling    Throat gets tight   Latex Anaphylaxis, Itching and Rash   Ibuprofen Other (See Comments)    Polycystic kidney disease   Nitrofurantoin Rash   Oseltamivir Nausea And Vomiting and Other (See Comments)    Family History  Problem Relation Age of Onset   Rheum arthritis Mother    Heart disease Mother        Pacemaker   COPD Mother        Smoker   Skin cancer Mother    Emphysema Mother    Heart Problems Mother    Stroke Father    Polycystic kidney disease Father    Healthy Sister    Arthritis Brother    Healthy Brother    Alzheimer's disease Maternal Grandmother 9   Diabetes Paternal Grandfather    Skin cancer Paternal Grandfather    Cancer Paternal Grandfather    Healthy Daughter    Breast cancer Neg Hx    Colon cancer Neg Hx      Prior to Admission medications   Medication Sig Start Date End Date Taking? Authorizing Provider  cefdinir  (OMNICEF ) 300 MG capsule Take 300 mg by mouth 2 (two) times daily. 03/27/24  Yes [provider]  metroNIDAZOLE  (FLAGYL ) 500 MG tablet Take 500 mg by mouth 2 (two) times daily. 03/27/24  Yes [provider]  AIMOVIG 140 MG/ML SOAJ Inject 1 each into the skin  every 30 (thirty) days. 04/15/22   [provider]  B Complex Vitamins (B COMPLEX-B12) TABS Take by mouth.    [provider]  cetirizine (ZYRTEC) 10 MG tablet Take 10 mg by mouth daily as needed for allergies (alternates with claritin ).    [provider]  clonazePAM  (KLONOPIN ) 0.5 MG tablet Take 0.5 mg by mouth 2 (two) times daily as needed for anxiety. 12/27/23   [provider]  DULoxetine  (CYMBALTA ) 30 MG capsule Take 1 capsule (30 mg total) by mouth daily. 08/09/21   Patel, Donika K, DO  eletriptan  (RELPAX ) 40 MG tablet Take by mouth daily as needed. 02/16/22   [provider]  enalapril  (VASOTEC ) 2.5 MG tablet Take 2.5 mg by mouth daily.  11/22/23   [provider]  fluticasone (FLONASE) 50 MCG/ACT nasal spray USE ONE SPRAY NASALLY DAILY 05/05/15   [provider]  gabapentin  (NEURONTIN ) 300 MG capsule TAKE 1 CAPSULE BY MOUTH  TWICE DAILY 12/29/20   Hilts, Ozell, MD  hydroxychloroquine  (PLAQUENIL ) 200 MG tablet Take 1 tablet (200 mg total) by mouth daily. 10/03/22   Jeannetta Lonni ORN, MD  Multiple Vitamin (MULTIVITAMIN) tablet Take 1 tablet by mouth daily.    [provider]  predniSONE  (DELTASONE ) 5 MG tablet Take 2 tablets (10 mg total) by mouth daily with breakfast. Patient taking differently: Take 5 mg by mouth daily with breakfast. 03/01/23   Rice, Lonni ORN, MD  zolpidem  (AMBIEN ) 10 MG tablet TAKE 1/2 TO 1 TABLET BY  MOUTH AT BEDTIME AS NEEDED  FOR SLEEP Patient not taking: Reported on 01/09/2024 03/23/21   Hilts, Ozell, MD    Physical Exam: BP 124/69   Pulse 75   Temp 98.5 F (36.9 C) (Oral)   Resp 18   Ht 5' (1.524 m)   Wt 55.3 kg   SpO2 97%   BMI 23.83 kg/m  General:  Alert, oriented, calm, in no acute distress  Eyes: EOMI, clear conjuctivae, white sclerea Neck: supple, no masses, trachea mildline  Cardiovascular: RRR, no murmurs or rubs, no peripheral edema  Respiratory: clear to auscultation bilaterally, no wheezes, no crackles  Abdomen: soft, nontender, nondistended, normal bowel tones heard  Skin: dry, no rashes  Musculoskeletal: no joint effusions, normal range of motion  Psychiatric: appropriate affect, normal speech  Neurologic: extraocular muscles intact, clear speech, moving all extremities with intact sensorium         Labs on Admission:  Basic Metabolic Panel: Recent Labs  Lab 03/27/24 0912 03/28/24 0748  NA 139 138  K 3.7 4.1  CL 101 100  CO2 26 21*  GLUCOSE 107* 95  BUN 21* 27*  CREATININE 2.16* 2.15*  CALCIUM 8.9 9.4   Liver Function Tests: Recent Labs  Lab 03/27/24 0912 03/28/24 0748  AST 34 28  ALT 19 18  ALKPHOS 68 88  BILITOT 0.5 0.9   PROT 6.7 6.5  ALBUMIN 4.0 4.0   No results for input(s): LIPASE, AMYLASE in the last 168 hours. No results for input(s): AMMONIA in the last 168 hours. CBC: Recent Labs  Lab 03/27/24 0912 03/28/24 0748  WBC 7.2 6.8  NEUTROABS 5.9 5.9  HGB 11.8* 11.6*  HCT 35.6* 35.0*  MCV 83.0 83.9  PLT 176 126*   Cardiac Enzymes: No results for input(s): CKTOTAL, CKMB, CKMBINDEX, TROPONINI in the last 168 hours. BNP (last 3 results) No results for input(s): BNP in the last 8760 hours.  ProBNP (last 3 results) No results for input(s): PROBNP in the  last 8760 hours.  CBG: No results for input(s): GLUCAP in the last 168 hours.  Radiological Exams on Admission: CT ABDOMEN PELVIS WO CONTRAST Result Date: 03/28/2024 EXAM: CT ABDOMEN AND PELVIS WITHOUT CONTRAST 03/28/2024 08:19:05 AM TECHNIQUE: CT of the abdomen and pelvis was performed without the administration of intravenous contrast. Multiplanar reformatted images are provided for review. Automated exposure control, iterative reconstruction, and/or weight-based adjustment of the mA/kV was utilized to reduce the radiation dose to as low as reasonably achievable. COMPARISON: 09/26/2023 CLINICAL HISTORY: Abdominal pain, acute, nonlocalized. Patient states she was seen at [facility] yesterday and was called today and told her blood cultures were positive and she needed to come back to be admitted. Diarrhea since yesterday. FINDINGS: LOWER CHEST: Visualized lung bases are clear. LIVER: Innumerable cysts identified throughout the liver. The distribution and morphology of the liver and liver cysts appears unchanged from the prior exam, compatible with polycystic liver disease. GALLBLADDER AND BILE DUCTS: The gallbladder appears normal. Unchanged common bile duct dilatation and mild intrahepatic biliary dilatation. The common bile duct measures up to 1.1 cm, image 49/4. Unchanged from prior exam. No calcified stones identified along  the course of the CBD. SPLEEN: Normal size. No focal lesion. PANCREAS: No mass. No ductal dilatation. ADRENAL GLANDS: Normal appearance. No mass. KIDNEYS, URETERS AND BLADDER: Bilateral nephromegaly with innumerable bilateral kidney cysts, compatible with polycystic kidney disease. Cysts are predominantly fluid attenuation. The largest cyst arises off the inferior pole of the right kidney measuring 6.3 cm, image 46/2. Additional small scattered hyperdense kidney cysts are identified which are technically incompletely characterized without IV contrast but may reflect hemorrhagic or proteinaceous cysts. No signs of obstructive uropathy. No ureteral calculi identified bilaterally. Urinary bladder is normal. GI AND BOWEL: No bowel dilatation to suggest bowel obstruction. No focal bowel inflammatory change identified. The appendix is not identified separate from the right lower quadrant bowel loops. No secondary signs of acute appendicitis noted. Liquid stool noted within the right colon. PERITONEUM AND RETROPERITONEUM: No free fluid or fluid collections. No signs of pneumoperitoneum. VASCULATURE: Normal caliber of the abdominal aorta. LYMPH NODES: No lymphadenopathy. REPRODUCTIVE ORGANS: Uterus and adnexal structures are unremarkable. BONES AND SOFT TISSUES: No acute osseous abnormality. No focal soft tissue abnormality. IMPRESSION: 1. No signs of bowel obstruction or inflammation. 2. Polycystic liver disease with innumerable cysts, unchanged from prior exam. 3. Polycystic kidney disease with bilateral nephromegaly and innumerable cysts, including incompletely characterized hyperdense cysts. 4. Stable common bile duct dilatation (up to 1.1 cm) and mild intrahepatic biliary dilatation, without signs of calcified choledocholithiasis. Electronically signed by: Waddell Calk MD 03/28/2024 08:35 AM EDT RP Workstation: [id number]   DG Chest Port 1 View Result Date: 03/27/2024 CLINICAL DATA:  Fever, chills, headache, nausea  since yesterday. History of sepsis and immunocompromised. EXAM: PORTABLE CHEST 1 VIEW COMPARISON:  09/26/2023 FINDINGS: The heart size and mediastinal contours are within normal limits. Both lungs are clear. The visualized skeletal structures are unremarkable. IMPRESSION: No active disease. Electronically Signed   By: Elspeth Bathe M.D.   On: 03/27/2024 09:51   Assessment/Plan Judy Manning is a 58 y.o. female with medical history significant for polycystic kidneys and liver, history of sepsis of unclear origin, RA on chronic steroids and Plaquenil  who is being admitted to the hospital with Klebsiella and Enterobacter bacteremia.  Bacteremia-with Klebsiella and Enterobacter, in the setting of diarrhea suspect GI source.  She is not meeting sepsis criteria, she is hemodynamically stable with normal lactic acid level. -Inpatient admission -  Follow-up repeat blood culture -Follow-up final speciation and sensitivities from 8/27 blood culture -ER provider as well as this Clinical research associate have consulted with ID who will follow -Empiric IV cefepime  for the time being -Check GI stool panel  Rheumatoid arthritis-continue home Plaquenil  and prednisone , will have a low threshold for steroid burst in case of any evidence of adrenal insufficiency  Polycystic kidneys and liver-she was hospitalized in 2022 with sepsis from a liver abscess, no such findings exist on CT scan done today.  DVT prophylaxis: Subcutaneous heparin     Code Status: Full Code  Consults called: Infectious disease  Admission status: The appropriate patient status for this patient is INPATIENT. Inpatient status is judged to be reasonable and necessary in order to provide the required intensity of service to ensure the patient's safety. The patient's presenting symptoms, physical exam findings, and initial radiographic and laboratory data in the context of their chronic comorbidities is felt to place them at high risk for further clinical  deterioration. Furthermore, it is not anticipated that the patient will be medically stable for discharge from the hospital within 2 midnights of admission.    I certify that at the point of admission it is my clinical judgment that the patient will require inpatient hospital care spanning beyond 2 midnights from the point of admission due to high intensity of service, high risk for further deterioration and high frequency of surveillance required  Time spent: 56 minutes  Angeliyah Kirkey CHRISTELLA Gail MD [facility] Pager [phone]  If 7PM-7AM, please contact night-coverage [email or web address] Password [email or web address]  03/28/2024, 1:50 PM

## 2024-03-28 NOTE — ED Notes (Signed)
 Called [facility] for transport

## 2024-03-28 NOTE — ED Notes (Signed)
 1 Set of blood cultures collected with initial blood work and sent to lab to hold.

## 2024-03-28 NOTE — ED Provider Notes (Signed)
 [place] EMERGENCY DEPARTMENT AT [facility] Provider Note   CSN: [id number] Arrival date & time: 03/28/24  9277     Patient presents with: Abnormal Lab   Judy Manning is a 58 y.o. female.   Patient called back due to positive blood cultures that were collected yesterday.  She has some diarrhea that started after leaving the ED yesterday.  She has not been able to stop using the bathroom.  She denies any recent antibiotics otherwise.  Denies any abdominal pain.  Feels a little nauseous.  Denies any chest pain shortness of breath cough sputum production.  History of rheumatoid arthritis on Plaquenil .  The history is provided by the patient.       Prior to Admission medications   Medication Sig Start Date End Date Taking? Authorizing Provider  AIMOVIG 140 MG/ML SOAJ Inject into the skin every 30 (thirty) days. 04/15/22   [provider]  B Complex Vitamins (B COMPLEX-B12) TABS Take by mouth.    [provider]  cetirizine (ZYRTEC) 10 MG tablet Take 10 mg by mouth daily as needed for allergies (alternates with claritin ).    [provider]  clonazePAM  (KLONOPIN ) 0.5 MG tablet Take by mouth. 12/27/23   [provider]  DULoxetine  (CYMBALTA ) 30 MG capsule Take 1 capsule (30 mg total) by mouth daily. 08/09/21   Patel, Donika K, DO  eletriptan  (RELPAX ) 40 MG tablet Take by mouth daily as needed. 02/16/22   [provider]  enalapril  (VASOTEC ) 2.5 MG tablet Take 2.5 mg by mouth daily. 11/22/23   [provider]  fluticasone (FLONASE) 50 MCG/ACT nasal spray USE ONE SPRAY NASALLY DAILY 05/05/15   [provider]  gabapentin  (NEURONTIN ) 300 MG capsule TAKE 1 CAPSULE BY MOUTH  TWICE DAILY 12/29/20   Hilts, Ozell, MD  hydroxychloroquine  (PLAQUENIL ) 200 MG tablet Take 1 tablet (200 mg total) by mouth daily. 10/03/22   Jeannetta Lonni ORN, MD  Multiple Vitamin (MULTIVITAMIN) tablet Take 1 tablet by mouth daily.    [provider]  predniSONE  (DELTASONE ) 5 MG tablet Take 2 tablets (10 mg total) by mouth daily with breakfast. Patient taking differently: Take 5 mg by mouth daily with breakfast. 03/01/23   Rice, Lonni ORN, MD  zolpidem  (AMBIEN ) 10 MG tablet TAKE 1/2 TO 1 TABLET BY  MOUTH AT BEDTIME AS NEEDED  FOR SLEEP Patient not taking: Reported on 01/09/2024 03/23/21   Hilts, Michael, MD    Allergies: Imitrex  [sumatriptan ], Latex, Ibuprofen, Nitrofurantoin, and Oseltamivir    Review of Systems  Updated Vital Signs BP 129/72   Pulse 95   Temp 98.1 F (36.7 C) (Oral)   Resp 18   SpO2 99%   Physical Exam Vitals and nursing note reviewed.  Constitutional:      General: She is not in acute distress.    Appearance: She is well-developed.  HENT:     Head: Normocephalic and atraumatic.     Nose: Nose normal.     Mouth/Throat:     Mouth: Mucous membranes are moist.  Eyes:     Extraocular Movements: Extraocular movements intact.     Conjunctiva/sclera: Conjunctivae normal.     Pupils: Pupils are equal, round, and reactive to light.  Cardiovascular:     Rate and Rhythm: Normal rate and regular rhythm.     Pulses: Normal pulses.     Heart sounds: Normal heart sounds. No murmur heard. Pulmonary:     Effort: Pulmonary effort is normal. No respiratory distress.  Breath sounds: Normal breath sounds.  Abdominal:     General: Abdomen is flat.     Palpations: Abdomen is soft.     Tenderness: There is no abdominal tenderness.  Musculoskeletal:        General: No swelling.     Cervical back: Neck supple.  Skin:    General: Skin is warm and dry.     Capillary Refill: Capillary refill takes less than 2 seconds.  Neurological:     Mental Status: She is alert.  Psychiatric:        Mood and Affect: Mood normal.     (all labs ordered are listed, but only abnormal results are displayed) Labs Reviewed  CBC WITH DIFFERENTIAL/PLATELET - Abnormal; Notable for the following components:      Result  Value   Hemoglobin 11.6 (*)    HCT 35.0 (*)    Platelets 126 (*)    Lymphs Abs 0.4 (*)    All other components within normal limits  COMPREHENSIVE METABOLIC PANEL WITH GFR - Abnormal; Notable for the following components:   CO2 21 (*)    BUN 27 (*)    Creatinine, Ser 2.15 (*)    GFR, Estimated 26 (*)    Anion gap 16 (*)    All other components within normal limits  C DIFFICILE QUICK SCREEN W PCR REFLEX    GASTROINTESTINAL PANEL BY PCR, STOOL (REPLACES STOOL CULTURE)  LACTIC ACID, PLASMA  LACTIC ACID, PLASMA    EKG: None  Radiology: CT ABDOMEN PELVIS WO CONTRAST Result Date: 03/28/2024 EXAM: CT ABDOMEN AND PELVIS WITHOUT CONTRAST 03/28/2024 08:19:05 AM TECHNIQUE: CT of the abdomen and pelvis was performed without the administration of intravenous contrast. Multiplanar reformatted images are provided for review. Automated exposure control, iterative reconstruction, and/or weight-based adjustment of the mA/kV was utilized to reduce the radiation dose to as low as reasonably achievable. COMPARISON: 09/26/2023 CLINICAL HISTORY: Abdominal pain, acute, nonlocalized. Patient states she was seen at [facility] yesterday and was called today and told her blood cultures were positive and she needed to come back to be admitted. Diarrhea since yesterday. FINDINGS: LOWER CHEST: Visualized lung bases are clear. LIVER: Innumerable cysts identified throughout the liver. The distribution and morphology of the liver and liver cysts appears unchanged from the prior exam, compatible with polycystic liver disease. GALLBLADDER AND BILE DUCTS: The gallbladder appears normal. Unchanged common bile duct dilatation and mild intrahepatic biliary dilatation. The common bile duct measures up to 1.1 cm, image 49/4. Unchanged from prior exam. No calcified stones identified along the course of the CBD. SPLEEN: Normal size. No focal lesion. PANCREAS: No mass. No ductal dilatation. ADRENAL GLANDS: Normal appearance. No  mass. KIDNEYS, URETERS AND BLADDER: Bilateral nephromegaly with innumerable bilateral kidney cysts, compatible with polycystic kidney disease. Cysts are predominantly fluid attenuation. The largest cyst arises off the inferior pole of the right kidney measuring 6.3 cm, image 46/2. Additional small scattered hyperdense kidney cysts are identified which are technically incompletely characterized without IV contrast but may reflect hemorrhagic or proteinaceous cysts. No signs of obstructive uropathy. No ureteral calculi identified bilaterally. Urinary bladder is normal. GI AND BOWEL: No bowel dilatation to suggest bowel obstruction. No focal bowel inflammatory change identified. The appendix is not identified separate from the right lower quadrant bowel loops. No secondary signs of acute appendicitis noted. Liquid stool noted within the right colon. PERITONEUM AND RETROPERITONEUM: No free fluid or fluid collections. No signs of pneumoperitoneum. VASCULATURE: Normal caliber of the abdominal aorta.  LYMPH NODES: No lymphadenopathy. REPRODUCTIVE ORGANS: Uterus and adnexal structures are unremarkable. BONES AND SOFT TISSUES: No acute osseous abnormality. No focal soft tissue abnormality. IMPRESSION: 1. No signs of bowel obstruction or inflammation. 2. Polycystic liver disease with innumerable cysts, unchanged from prior exam. 3. Polycystic kidney disease with bilateral nephromegaly and innumerable cysts, including incompletely characterized hyperdense cysts. 4. Stable common bile duct dilatation (up to 1.1 cm) and mild intrahepatic biliary dilatation, without signs of calcified choledocholithiasis. Electronically signed by: Waddell Calk MD 03/28/2024 08:35 AM EDT RP Workstation: [id number]   DG Chest Port 1 View Result Date: 03/27/2024 CLINICAL DATA:  Fever, chills, headache, nausea since yesterday. History of sepsis and immunocompromised. EXAM: PORTABLE CHEST 1 VIEW COMPARISON:  09/26/2023 FINDINGS: The heart size and  mediastinal contours are within normal limits. Both lungs are clear. The visualized skeletal structures are unremarkable. IMPRESSION: No active disease. Electronically Signed   By: Elspeth Bathe M.D.   On: 03/27/2024 09:51     .Critical Care  Performed by: Ruthe Cornet, DO Authorized by: Ruthe Cornet, DO   Critical care provider statement:    Critical care time (minutes):  35   Critical care was necessary to treat or prevent imminent or life-threatening deterioration of the following conditions:  Sepsis   Critical care was time spent personally by me on the following activities:  Blood draw for specimens, development of treatment plan with patient or surrogate, discussions with primary provider, evaluation of patient's response to treatment, discussions with consultants, examination of patient, obtaining history from patient or surrogate, ordering and performing treatments and interventions, ordering and review of laboratory studies, ordering and review of radiographic studies, re-evaluation of patient's condition, pulse oximetry and review of old charts   Care discussed with: admitting provider      Medications Ordered in the ED  loperamide  (IMODIUM ) capsule 4 mg (has no administration in time range)  ceFEPIme  (MAXIPIME ) 2 g in sodium chloride  0.9 % 100 mL IVPB (has no administration in time range)  sodium chloride  0.9 % bolus 1,000 mL (1,000 mLs Intravenous New Bag/Given 03/28/24 0751)                                    Medical Decision Making Amount and/or Complexity of Data Reviewed Labs: ordered. Radiology: ordered.  Risk Prescription drug management. Decision regarding hospitalization.   Judy Manning is here with positive blood cultures.  She grew positive Klebsiella and Enterobacter on blood cultures that were collected yesterday.  She has a history of sepsis this without a source in the past.  She is on Plaquenil  for rheumatoid arthritis.  She states that overnight she  developed pretty severe diarrhea.  She had been on antibiotics before getting IV antibiotics in the ED.  She was discharged but called to come back for positive blood cultures.  Per my review of her labs yesterday she had a mild AKI but otherwise had no leukocytosis lactic acidosis or urine infection or pneumonia on chest x-ray.  Diarrhea started after she left.  She not really having any abdominal pain but will get a CT of her abdomen and send another set of cultures and check basic labs start IV fluids and call infectious disease for antibiotic recommendations.  Patient has followed with Dr. Lindia in the past given her prior sepsis history.  She has history of polycystic kidney disease.  Denies any IV drug use.  Has not had  any fevers.  But had fevers and chills yesterday.  Talked with Dr. Dennise with ID recommends keeping patient on cefepime  for now.  Blood work showed no significant leukocytosis.  Creatinine still 2.1.  Lactic acid is normal.  CT scan abdomen pelvis is unremarkable.  Will admit to medicine for further sepsis care.  This chart was dictated using voice recognition software.  Despite best efforts to proofread,  errors can occur which can change the documentation meaning.      Final diagnoses:  Bacteremia due to Klebsiella pneumoniae    ED Discharge Orders     None          Ruthe Cornet, DO 03/28/24 0840

## 2024-03-28 NOTE — ED Notes (Signed)
 Report called to Derk, Charity fundraiser at [employer].

## 2024-03-28 NOTE — ED Provider Notes (Addendum)
 58 year old female with multiple positive blood culture results.  Appears to have Enterobacter versus Klebsiella sepsis.  Was seen yesterday for nonspecific fever.  Also has a bottle of gram-negative rods that are positive.  I called patient's main phone number, patient's contact phone number without answer.  Both numbers sent straight to voicemail.  Multiple attempts to reach all contacts. Patient's chart states that detailed messages can be left on her mobile number.  Voicemail was left informing her of critical need to call the emergency department back or come back and check in immediately.   Jerral Meth, MD 03/28/24 [phone]  Spouse number removed from chart at last visit.   Jerral Meth, MD 03/28/24 [phone]  Patient called back emergency department.  Recommended she proceed to admitting hospital center but she stated she could only get to her local emergency department.  Recommended she proceed to the nearest emergency department that she is able to get to.   Jerral Meth, MD 03/28/24 [phone]

## 2024-03-29 DIAGNOSIS — Q613 Polycystic kidney, unspecified: Secondary | ICD-10-CM

## 2024-03-29 DIAGNOSIS — B961 Klebsiella pneumoniae [K. pneumoniae] as the cause of diseases classified elsewhere: Secondary | ICD-10-CM

## 2024-03-29 DIAGNOSIS — R7881 Bacteremia: Secondary | ICD-10-CM | POA: Diagnosis not present

## 2024-03-29 DIAGNOSIS — R197 Diarrhea, unspecified: Secondary | ICD-10-CM

## 2024-03-29 DIAGNOSIS — M059 Rheumatoid arthritis with rheumatoid factor, unspecified: Secondary | ICD-10-CM

## 2024-03-29 DIAGNOSIS — N179 Acute kidney failure, unspecified: Secondary | ICD-10-CM

## 2024-03-29 DIAGNOSIS — D649 Anemia, unspecified: Secondary | ICD-10-CM

## 2024-03-29 LAB — GASTROINTESTINAL PANEL BY PCR, STOOL (REPLACES STOOL CULTURE)

## 2024-03-29 LAB — CBC
HCT: 32.3 % — ABNORMAL LOW (ref 36.0–46.0)
Hemoglobin: 10.3 g/dL — ABNORMAL LOW (ref 12.0–15.0)
MCH: 27.4 pg (ref 26.0–34.0)
MCHC: 31.9 g/dL (ref 30.0–36.0)
MCV: 85.9 fL (ref 80.0–100.0)
Platelets: 141 K/uL — ABNORMAL LOW (ref 150–400)
RBC: 3.76 MIL/uL — ABNORMAL LOW (ref 3.87–5.11)
RDW: 14 % (ref 11.5–15.5)
WBC: 5 K/uL (ref 4.0–10.5)
nRBC: 0 % (ref 0.0–0.2)

## 2024-03-29 LAB — BASIC METABOLIC PANEL WITH GFR
Anion gap: 11 (ref 5–15)
BUN: 20 mg/dL (ref 6–20)
CO2: 24 mmol/L (ref 22–32)
Calcium: 8.4 mg/dL — ABNORMAL LOW (ref 8.9–10.3)
Chloride: 106 mmol/L (ref 98–111)
Creatinine, Ser: 1.77 mg/dL — ABNORMAL HIGH (ref 0.44–1.00)
GFR, Estimated: 33 mL/min — ABNORMAL LOW (ref >60)
Glucose, Bld: 94 mg/dL (ref 70–99)
Potassium: 3.9 mmol/L (ref 3.5–5.1)
Sodium: 141 mmol/L (ref 135–145)

## 2024-03-29 MED ORDER — ADULT MULTIVITAMIN W/MINERALS CH: 1.0000 | ORAL_TABLET | ORAL | Status: DC

## 2024-03-29 MED ORDER — SODIUM CHLORIDE 0.9 % IV SOLN
2.0000 g | INTRAVENOUS | Status: DC
  Administered 2024-03-30 – 2024-03-31 (×2): 2 g via INTRAVENOUS
  Filled 2024-03-29 (×2): qty 20

## 2024-03-29 MED ORDER — ENALAPRIL MALEATE 2.5 MG PO TABS
2.5000 mg | ORAL_TABLET | Freq: Every day | ORAL | Status: DC
  Administered 2024-03-29 – 2024-03-31 (×3): 2.5 mg via ORAL
  Filled 2024-03-29 (×3): qty 1

## 2024-03-29 MED ORDER — LORATADINE 10 MG PO TABS
10.0000 mg | ORAL_TABLET | Freq: Every day | ORAL | Status: DC
  Administered 2024-03-30 – 2024-03-31 (×2): 10 mg via ORAL
  Filled 2024-03-29 (×3): qty 1

## 2024-03-29 MED ORDER — GABAPENTIN 300 MG PO CAPS
300.0000 mg | ORAL_CAPSULE | Freq: Two times a day (BID) | ORAL | Status: DC
  Administered 2024-03-29 – 2024-03-31 (×6): 300 mg via ORAL
  Filled 2024-03-29 (×6): qty 1

## 2024-03-29 MED ORDER — CLONAZEPAM 0.5 MG PO TABS
0.5000 mg | ORAL_TABLET | Freq: Two times a day (BID) | ORAL | Status: DC | PRN
Start: 2024-03-29 — End: 2024-03-31
  Administered 2024-03-29 – 2024-03-30 (×2): 0.5 mg via ORAL
  Filled 2024-03-29 (×2): qty 1

## 2024-03-29 MED ORDER — DULOXETINE HCL 30 MG PO CPEP
30.0000 mg | ORAL_CAPSULE | Freq: Every day | ORAL | Status: DC
  Administered 2024-03-29 – 2024-03-31 (×3): 30 mg via ORAL
  Filled 2024-03-29 (×3): qty 1

## 2024-03-29 MED ORDER — SODIUM CHLORIDE 0.9 % IV SOLN: INTRAVENOUS | Status: DC

## 2024-03-29 MED ORDER — ELETRIPTAN HYDROBROMIDE 40 MG PO TABS
40.0000 mg | ORAL_TABLET | Freq: Every day | ORAL | Status: DC | PRN
Start: 2024-03-29 — End: 2024-03-31

## 2024-03-29 NOTE — Progress Notes (Signed)
 PROGRESS NOTE    Judy Manning  FMW:[id number] DOB: 12-14-1965 DOA: 03/28/2024 PCP: Hughie Sharper, MD    Chief Complaint  Patient presents with   Abnormal Lab    Brief Narrative:  Patient is a pleasant 58 year old female history of polycystic kidneys and liver, history of sepsis of unclear etiology, rheumatoid arthritis on chronic steroids and Plaquenil  presented to the ED with a Klebsiella Enterobacter bacteremia.  Patient placed empirically on IV antibiotics.  Hospitalist consulted for admission.  ID consulted.   Assessment & Plan:   Principal Problem:   Bacteremia due to Klebsiella pneumoniae Active Problems:   Polycystic kidney disease   Seropositive rheumatoid arthritis (HCC)   AKI (acute kidney injury) (HCC)   Anemia   #1 Klebsiella pneumoniae and Enterobacter bacteremia -Patient noted to have initially presented to the ED with chills, was pancultured discharged home and called back for admission due to positive blood cultures. - Patient also noted to have had diarrhea with onset of symptoms which has since improved after receiving Imodium  per patient. - Source of bacteremia likely GI related. - Patient noted on admission that she did not meet criteria for sepsis. - Repeat blood cultures ordered and pending. -GI pathogen panel negative. -C. difficile PCR negative. -CT abdomen and pelvis done on presentation with no signs of bowel obstruction or inflammation.  Polycystic liver disease, polycystic kidney disease noted.  Stable common bile duct dilatation and mild intrahepatic biliary dilatation, without signs of calcified choledocholithiasis. - Continue empiric IV cefepime . - Patient seen in consultation by ID who recommended MRCP and GI evaluation. - Continue IV fluids, supportive care.  2.  Rheumatoid arthritis -Continue home regimen Plaquenil  and prednisone .  3.  Polycystic kidneys and liver -Patient will determine hospitalizing 2022 with sepsis from a liver  abscess. - Outpatient follow-up.  4.  AKI -Improved with hydration.  5.  Anemia - Patient with no overt bleeding. - Hemoglobin stable. - Check an anemia panel. - Follow H&H. - Transfusion threshold hemoglobin < 7  DVT prophylaxis: Heparin  Code Status: Full Family Communication: Updated patient.  No family at bedside. Disposition: Home when clinically improved and cleared by ID.  Status is: Inpatient Remains inpatient appropriate because: Severity of illness   Consultants:  ID: Dr. Dennise 03/29/2024  Procedures:  CT abdomen and pelvis 03/28/2024   Antimicrobials:  Anti-infectives (From admission, onward)    Start     Dose/Rate Route Frequency Ordered Stop   03/30/24 0700  cefTRIAXone  (ROCEPHIN ) 2 g in sodium chloride  0.9 % 100 mL IVPB        2 g 200 mL/hr over 30 Minutes Intravenous Every 24 hours 03/29/24 0952     03/29/24 1000  hydroxychloroquine  (PLAQUENIL ) tablet 200 mg        200 mg Oral Daily 03/28/24 1310     03/28/24 0900  ceFEPIme  (MAXIPIME ) 2 g in sodium chloride  0.9 % 100 mL IVPB  Status:  Discontinued        2 g 200 mL/hr over 30 Minutes Intravenous Every 24 hours 03/28/24 0834 03/29/24 0952         Subjective: Patient laying in bed.  Denies any further chills.  Denies any chest pain or shortness of breath.  States has had no further diarrhea after being given Imodium  on presentation.  Tolerating current diet.  Objective: Vitals:   03/28/24 1733 03/28/24 2101 03/29/24 0043 03/29/24 0504  BP: 134/75 (!) 143/89 (!) 144/85 138/71  Pulse: 84 92 80 86  Resp: 18 20 18  17  Temp: 99 F (37.2 C) 99.2 F (37.3 C) 99 F (37.2 C) 98.1 F (36.7 C)  TempSrc: Oral   Oral  SpO2: 100% 97% 100% 100%  Weight:      Height:       No intake or output data in the 24 hours ending 03/29/24 1723  Filed Weights   03/28/24 1259  Weight: 55.3 kg    Examination:  General exam: Appears calm and comfortable  Respiratory system: Clear to auscultation.  No wheezes, no  crackles, no rhonchi.  Fair air movement.  Speaking in full sentences.  Respiratory effort normal. Cardiovascular system: S1 & S2 heard, RRR. No JVD, murmurs, rubs, gallops or clicks. No pedal edema. Gastrointestinal system: Abdomen is nondistended, soft and nontender. No organomegaly or masses felt. Normal bowel sounds heard. Central nervous system: Alert and oriented. No focal neurological deficits. Extremities: Symmetric 5 x 5 power. Skin: No rashes, lesions or ulcers Psychiatry: Judgement and insight appear normal. Mood & affect appropriate.     Data Reviewed: I have personally reviewed following labs and imaging studies  CBC: Recent Labs  Lab 03/27/24 0912 03/28/24 0748 03/29/24 0457  WBC 7.2 6.8 5.0  NEUTROABS 5.9 5.9  --   HGB 11.8* 11.6* 10.3*  HCT 35.6* 35.0* 32.3*  MCV 83.0 83.9 85.9  PLT 176 126* 141*    Basic Metabolic Panel: Recent Labs  Lab 03/27/24 0912 03/28/24 0748 03/29/24 0457  NA 139 138 141  K 3.7 4.1 3.9  CL 101 100 106  CO2 26 21* 24  GLUCOSE 107* 95 94  BUN 21* 27* 20  CREATININE 2.16* 2.15* 1.77*  CALCIUM 8.9 9.4 8.4*    GFR: Estimated Creatinine Clearance: 28 mL/min (A) (by C-G formula based on SCr of 1.77 mg/dL (H)).  Liver Function Tests: Recent Labs  Lab 03/27/24 0912 03/28/24 0748  AST 34 28  ALT 19 18  ALKPHOS 68 88  BILITOT 0.5 0.9  PROT 6.7 6.5  ALBUMIN 4.0 4.0    CBG: No results for input(s): GLUCAP in the last 168 hours.   Recent Results (from the past 240 hours)  Resp panel by RT-PCR (RSV, Flu A&B, Covid) Anterior Nasal Swab     Status: None   Collection Time: 03/27/24  8:46 AM   Specimen: Anterior Nasal Swab  Result Value Ref Range Status   SARS Coronavirus 2 by RT PCR NEGATIVE NEGATIVE Final    Comment: (NOTE) SARS-CoV-2 target nucleic acids are NOT DETECTED.  The SARS-CoV-2 RNA is generally detectable in upper respiratory specimens during the acute phase of infection. The lowest concentration of  SARS-CoV-2 viral copies this assay can detect is 138 copies/mL. A negative result does not preclude SARS-Cov-2 infection and should not be used as the sole basis for treatment or other patient management decisions. A negative result may occur with  improper specimen collection/handling, submission of specimen other than nasopharyngeal swab, presence of viral mutation(s) within the areas targeted by this assay, and inadequate number of viral copies(<138 copies/mL). A negative result must be combined with clinical observations, patient history, and epidemiological information. The expected result is Negative.  Fact Sheet for Patients:  BloggerCourse.com  Fact Sheet for Healthcare Providers:  SeriousBroker.it  This test is no t yet approved or cleared by the [place]  FDA and  has been authorized for detection and/or diagnosis of SARS-CoV-2 by FDA under an Emergency Use Authorization (EUA). This EUA will remain  in effect (meaning this test can be used) for the duration  of the COVID-19 declaration under Section 564(b)(1) of the Act, 21 U.S.C.section 360bbb-3(b)(1), unless the authorization is terminated  or revoked sooner.       Influenza A by PCR NEGATIVE NEGATIVE Final   Influenza B by PCR NEGATIVE NEGATIVE Final    Comment: (NOTE) The Xpert Xpress SARS-CoV-2/FLU/RSV plus assay is intended as an aid in the diagnosis of influenza from Nasopharyngeal swab specimens and should not be used as a sole basis for treatment. Nasal washings and aspirates are unacceptable for Xpert Xpress SARS-CoV-2/FLU/RSV testing.  Fact Sheet for Patients: BloggerCourse.com  Fact Sheet for Healthcare Providers: SeriousBroker.it  This test is not yet approved or cleared by the [place]  FDA and has been authorized for detection and/or diagnosis of SARS-CoV-2 by FDA under an Emergency Use  Authorization (EUA). This EUA will remain in effect (meaning this test can be used) for the duration of the COVID-19 declaration under Section 564(b)(1) of the Act, 21 U.S.C. section 360bbb-3(b)(1), unless the authorization is terminated or revoked.     Resp Syncytial Virus by PCR NEGATIVE NEGATIVE Final    Comment: (NOTE) Fact Sheet for Patients: BloggerCourse.com  Fact Sheet for Healthcare Providers: SeriousBroker.it  This test is not yet approved or cleared by the [place]  FDA and has been authorized for detection and/or diagnosis of SARS-CoV-2 by FDA under an Emergency Use Authorization (EUA). This EUA will remain in effect (meaning this test can be used) for the duration of the COVID-19 declaration under Section 564(b)(1) of the Act, 21 U.S.C. section 360bbb-3(b)(1), unless the authorization is terminated or revoked.  Performed at [facility], [address] [place], [place] [address]   Blood Culture (routine x 2)     Status: Abnormal (Preliminary result)   Collection Time: 03/27/24  9:04 AM   Specimen: BLOOD LEFT FOREARM  Result Value Ref Range Status   Specimen Description   Final    BLOOD LEFT FOREARM Performed at [facility], [address], [place], [place] [address]    Special Requests   Final    BOTTLES DRAWN AEROBIC AND ANAEROBIC Blood Culture adequate volume Performed at [facility], [address] [place], [place] [address]    Culture  Setup Time   Final    GRAM NEGATIVE RODS ANAEROBIC BOTTLE ONLY CRITICAL RESULT CALLED TO, READ BACK BY AND VERIFIED WITH: RN SIM [phone] FCP    Culture (A)  Final    KLEBSIELLA PNEUMONIAE SUSCEPTIBILITIES TO FOLLOW Performed at [facility], [address], [place], [place] [address]    Report Status PENDING  Incomplete  Blood Culture ID Panel (Reflexed)     Status: Abnormal   Collection Time: 03/27/24  9:04 AM  Result  Value Ref Range Status   Enterococcus faecalis NOT DETECTED NOT DETECTED Final   Enterococcus Faecium NOT DETECTED NOT DETECTED Final   Listeria monocytogenes NOT DETECTED NOT DETECTED Final   Staphylococcus species NOT DETECTED NOT DETECTED Final   Staphylococcus aureus (BCID) NOT DETECTED NOT DETECTED Final   Staphylococcus epidermidis NOT DETECTED NOT DETECTED Final   Staphylococcus lugdunensis NOT DETECTED NOT DETECTED Final   Streptococcus species NOT DETECTED NOT DETECTED Final   Streptococcus agalactiae NOT DETECTED NOT DETECTED Final   Streptococcus pneumoniae NOT DETECTED NOT DETECTED Final   Streptococcus pyogenes NOT DETECTED NOT DETECTED Final   A.calcoaceticus-baumannii NOT DETECTED NOT DETECTED Final   Bacteroides fragilis NOT DETECTED NOT DETECTED Final   Enterobacterales DETECTED (A) NOT  DETECTED Final    Comment: Enterobacterales represent a large order of gram negative bacteria, not a single organism. CRITICAL RESULT CALLED TO, READ BACK BY AND VERIFIED WITH: RN ALFRED [phone] FCP    Enterobacter cloacae complex NOT DETECTED NOT DETECTED Final   Escherichia coli NOT DETECTED NOT DETECTED Final   Klebsiella aerogenes NOT DETECTED NOT DETECTED Final   Klebsiella oxytoca NOT DETECTED NOT DETECTED Final   Klebsiella pneumoniae DETECTED (A) NOT DETECTED Final    Comment: CRITICAL RESULT CALLED TO, READ BACK BY AND VERIFIED WITH: RN ALFRED [phone] FCP    Proteus species NOT DETECTED NOT DETECTED Final   Salmonella species NOT DETECTED NOT DETECTED Final   Serratia marcescens NOT DETECTED NOT DETECTED Final   Haemophilus influenzae NOT DETECTED NOT DETECTED Final   Neisseria meningitidis NOT DETECTED NOT DETECTED Final   Pseudomonas aeruginosa NOT DETECTED NOT DETECTED Final   Stenotrophomonas maltophilia NOT DETECTED NOT DETECTED Final   Candida albicans NOT DETECTED NOT DETECTED Final   Candida auris NOT DETECTED NOT DETECTED Final   Candida glabrata NOT  DETECTED NOT DETECTED Final   Candida krusei NOT DETECTED NOT DETECTED Final   Candida parapsilosis NOT DETECTED NOT DETECTED Final   Candida tropicalis NOT DETECTED NOT DETECTED Final   Cryptococcus neoformans/gattii NOT DETECTED NOT DETECTED Final   CTX-M ESBL NOT DETECTED NOT DETECTED Final   Carbapenem resistance IMP NOT DETECTED NOT DETECTED Final   Carbapenem resistance KPC NOT DETECTED NOT DETECTED Final   Carbapenem resistance NDM NOT DETECTED NOT DETECTED Final   Carbapenem resist OXA 48 LIKE NOT DETECTED NOT DETECTED Final   Carbapenem resistance VIM NOT DETECTED NOT DETECTED Final    Comment: Performed at [facility], [address], [place], [place] [address]  Blood Culture (routine x 2)     Status: Abnormal (Preliminary result)   Collection Time: 03/27/24  9:09 AM   Specimen: BLOOD RIGHT FOREARM  Result Value Ref Range Status   Specimen Description   Final    BLOOD RIGHT FOREARM Performed at [facility], [address], [place], [place] [address]    Special Requests   Final    BOTTLES DRAWN AEROBIC AND ANAEROBIC Blood Culture adequate volume Performed at [facility], [address] [place], [place] [address]    Culture  Setup Time   Final    GRAM NEGATIVE RODS AEROBIC BOTTLE ONLY CRITICAL VALUE NOTED.  VALUE IS CONSISTENT WITH PREVIOUSLY REPORTED AND CALLED VALUE. Performed at [facility], [address] [place], [place] [address]    Culture KLEBSIELLA PNEUMONIAE (A)  Final   Report Status PENDING  Incomplete  C Difficile Quick Screen w PCR reflex     Status: None   Collection Time: 03/28/24  7:48 AM   Specimen: STOOL  Result Value Ref Range Status   C Diff antigen NEGATIVE NEGATIVE Final   C Diff toxin NEGATIVE NEGATIVE Final   C Diff interpretation No C. difficile detected.  Final    Comment: Performed at [facility], [address], [place], [place] [address]  Gastrointestinal Panel by PCR , Stool     Status: None    Collection Time: 03/28/24  7:48 AM   Specimen: STOOL  Result Value Ref Range Status   Campylobacter species NOT DETECTED NOT DETECTED Final   Plesimonas shigelloides NOT DETECTED NOT DETECTED Final   Salmonella species NOT DETECTED NOT DETECTED Final   Yersinia enterocolitica  NOT DETECTED NOT DETECTED Final   Vibrio species NOT DETECTED NOT DETECTED Final   Vibrio cholerae NOT DETECTED NOT DETECTED Final   Enteroaggregative E coli (EAEC) NOT DETECTED NOT DETECTED Final   Enteropathogenic E coli (EPEC) NOT DETECTED NOT DETECTED Final   Enterotoxigenic E coli (ETEC) NOT DETECTED NOT DETECTED Final   Shiga like toxin producing E coli (STEC) NOT DETECTED NOT DETECTED Final   Shigella/Enteroinvasive E coli (EIEC) NOT DETECTED NOT DETECTED Final   Cryptosporidium NOT DETECTED NOT DETECTED Final   Cyclospora cayetanensis NOT DETECTED NOT DETECTED Final   Entamoeba histolytica NOT DETECTED NOT DETECTED Final   Giardia lamblia NOT DETECTED NOT DETECTED Final   Adenovirus F40/41 NOT DETECTED NOT DETECTED Final   Astrovirus NOT DETECTED NOT DETECTED Final   Norovirus GI/GII NOT DETECTED NOT DETECTED Final   Rotavirus A NOT DETECTED NOT DETECTED Final   Sapovirus (I, II, IV, and V) NOT DETECTED NOT DETECTED Final    Comment: Performed at [facility], [address] [place], [place] [address]  Culture, blood (Routine X 2) w Reflex to ID Panel     Status: None (Preliminary result)   Collection Time: 03/28/24  1:53 PM   Specimen: BLOOD LEFT ARM  Result Value Ref Range Status   Specimen Description BLOOD LEFT ARM ANAEROBIC BOTTLE ONLY  Final   Special Requests   Final    BOTTLES DRAWN AEROBIC AND ANAEROBIC Blood Culture adequate volume   Culture   Final    NO GROWTH < 12 HOURS Performed at [facility], [address] [place], [place] [address]    Report Status PENDING  Incomplete  Culture, blood (Routine X 2) w Reflex to ID Panel     Status: None (Preliminary result)    Collection Time: 03/28/24  1:58 PM   Specimen: BLOOD LEFT ARM  Result Value Ref Range Status   Specimen Description BLOOD LEFT ARM  Final   Special Requests   Final    BOTTLES DRAWN AEROBIC AND ANAEROBIC Blood Culture adequate volume   Culture   Final    NO GROWTH < 12 HOURS Performed at [facility], [address] [place], [place] [address]    Report Status PENDING  Incomplete         Radiology Studies: CT ABDOMEN PELVIS WO CONTRAST Result Date: 03/28/2024 EXAM: CT ABDOMEN AND PELVIS WITHOUT CONTRAST 03/28/2024 08:19:05 AM TECHNIQUE: CT of the abdomen and pelvis was performed without the administration of intravenous contrast. Multiplanar reformatted images are provided for review. Automated exposure control, iterative reconstruction, and/or weight-based adjustment of the mA/kV was utilized to reduce the radiation dose to as low as reasonably achievable. COMPARISON: 09/26/2023 CLINICAL HISTORY: Abdominal pain, acute, nonlocalized. Patient states she was seen at [facility] yesterday and was called today and told her blood cultures were positive and she needed to come back to be admitted. Diarrhea since yesterday. FINDINGS: LOWER CHEST: Visualized lung bases are clear. LIVER: Innumerable cysts identified throughout the liver. The distribution and morphology of the liver and liver cysts appears unchanged from the prior exam, compatible with polycystic liver disease. GALLBLADDER AND BILE DUCTS: The gallbladder appears normal. Unchanged common bile duct dilatation and mild intrahepatic biliary dilatation. The common bile duct measures up to 1.1 cm, image 49/4. Unchanged from prior exam. No calcified stones identified along the course of the CBD. SPLEEN: Normal size. No focal lesion. PANCREAS: No mass. No ductal dilatation. ADRENAL GLANDS: Normal appearance. No mass.  KIDNEYS, URETERS AND BLADDER: Bilateral nephromegaly with innumerable bilateral kidney cysts, compatible with polycystic  kidney disease. Cysts are predominantly fluid attenuation. The largest cyst arises off the inferior pole of the right kidney measuring 6.3 cm, image 46/2. Additional small scattered hyperdense kidney cysts are identified which are technically incompletely characterized without IV contrast but may reflect hemorrhagic or proteinaceous cysts. No signs of obstructive uropathy. No ureteral calculi identified bilaterally. Urinary bladder is normal. GI AND BOWEL: No bowel dilatation to suggest bowel obstruction. No focal bowel inflammatory change identified. The appendix is not identified separate from the right lower quadrant bowel loops. No secondary signs of acute appendicitis noted. Liquid stool noted within the right colon. PERITONEUM AND RETROPERITONEUM: No free fluid or fluid collections. No signs of pneumoperitoneum. VASCULATURE: Normal caliber of the abdominal aorta. LYMPH NODES: No lymphadenopathy. REPRODUCTIVE ORGANS: Uterus and adnexal structures are unremarkable. BONES AND SOFT TISSUES: No acute osseous abnormality. No focal soft tissue abnormality. IMPRESSION: 1. No signs of bowel obstruction or inflammation. 2. Polycystic liver disease with innumerable cysts, unchanged from prior exam. 3. Polycystic kidney disease with bilateral nephromegaly and innumerable cysts, including incompletely characterized hyperdense cysts. 4. Stable common bile duct dilatation (up to 1.1 cm) and mild intrahepatic biliary dilatation, without signs of calcified choledocholithiasis. Electronically signed by: Waddell Calk MD 03/28/2024 08:35 AM EDT RP Workstation: [id number]        Scheduled Meds:  DULoxetine   30 mg Oral Daily   enalapril   2.5 mg Oral Daily   gabapentin   300 mg Oral BID   heparin   5,000 Units Subcutaneous Q8H   hydroxychloroquine   200 mg Oral Daily   loratadine   10 mg Oral Daily   multivitamin with minerals  1 tablet Oral Once per day on Monday Wednesday Friday   Continuous Infusions:  sodium  chloride 100 mL/hr at 03/29/24 1441   [START ON 03/30/2024] cefTRIAXone  (ROCEPHIN )  IV       LOS: 1 day    Time spent: 40 minutes    Toribio Hummer, MD [facility]   To contact the attending provider between 7A-7P or the covering provider during after hours 7P-7A, please log into the web site [email or web address] and access using universal [place] password for that web site. If you do not have the password, please call the hospital operator.  03/29/2024, 5:23 PM

## 2024-03-29 NOTE — Consult Note (Signed)
 [facility] Consult  Referring Provider: Infectious disease/[facility] hospitalist Primary Care Physician:  Hughie Sharper, MD Primary Gastroenterologist: [place]  Reason for Consultation:  Bacteremia, possible GI source  HPI: Judy Manning is a 58 y.o. female was admitted on 03/28/2024 with chills, when was was noted to have blood culture positive for Klebsiella pneumoniae and Enterobacter from 03/27/2024.  She has a past medical history of polycystic kidney and liver disease, rheumatoid arthritis, history of infected liver cyst.  She reports that in 2022 she underwent liver cyst aspiration which showed Klebsiella and was treated for infected liver cyst.  Patient reports that after developing chills, she was given cefepime  and metronidazole  and was called for admission when her blood cultures came back positive. Patient denies nausea, vomiting, abdominal pain, change in bowel habits, noticing blood in stool or black stools.  She denies acid reflux, heartburn, difficulty swallowing, pain on swallowing, unintentional weight loss or loss of appetite. She reports having diarrhea after oral antibiotics.  Stool tested negative for C. difficile and she had a negative GI pathogen panel.  No prior EGD or colonoscopy. She reports that she had a negative Cologuard study few years ago.   Past Medical History:  Diagnosis Date   COVID 12/2020   Detached retina    Liver cyst 05/03/2021   Liver, polycystic with compressive symptoms 05/03/2021   Polycystic kidney disease    Sepsis (HCC)     Past Surgical History:  Procedure Laterality Date   ABDOMINAL HERNIA REPAIR     AUGMENTATION MAMMAPLASTY     CARPAL TUNNEL RELEASE Right    GANGLION CYST EXCISION Right    RETINAL TEAR REPAIR CRYOTHERAPY Bilateral    tendonitis surgery elbow     VITRECTOMY      Prior to Admission medications   Medication Sig Start Date End Date Taking? Authorizing Provider  AIMOVIG 140 MG/ML SOAJ Inject 140  mg into the skin every 28 (twenty-eight) days. 04/15/22  Yes [provider]  B Complex Vitamins (B COMPLEX-B12) TABS Take 1 tablet by mouth daily as needed (for deficiency).   Yes [provider]  cefdinir  (OMNICEF ) 300 MG capsule Take 300 mg by mouth 2 (two) times daily. 03/27/24  Yes [provider]  cetirizine (ZYRTEC) 10 MG tablet Take 10 mg by mouth daily as needed for allergies or rhinitis.   Yes [provider]  clonazePAM  (KLONOPIN ) 0.5 MG tablet Take 0.5 mg by mouth 2 (two) times daily as needed for anxiety (or sleep). 12/27/23  Yes [provider]  DULoxetine  (CYMBALTA ) 30 MG capsule Take 1 capsule (30 mg total) by mouth daily. Patient taking differently: Take 30 mg by mouth in the morning. 08/09/21  Yes Patel, Donika K, DO  eletriptan  (RELPAX ) 40 MG tablet Take 40 mg by mouth daily as needed (for breakthrough migraines- may take an additional 40 mg once, if no relief after an hour - Max of 80 mg/24 hours). 02/16/22  Yes [provider]  enalapril  (VASOTEC ) 2.5 MG tablet Take 2.5 mg by mouth daily. 11/22/23  Yes [provider]  gabapentin  (NEURONTIN ) 300 MG capsule TAKE 1 CAPSULE BY MOUTH  TWICE DAILY Patient taking differently: Take 300 mg by mouth in the morning and at bedtime. 12/29/20  Yes Hilts, Sharper, MD  hydroxychloroquine  (PLAQUENIL ) 200 MG tablet Take 1 tablet (200 mg total) by mouth daily. 10/03/22  Yes Rice, Lonni ORN, MD  metroNIDAZOLE  (FLAGYL ) 500 MG tablet Take 500 mg by mouth 2 (two) times daily. 03/27/24  Yes  [provider]  Multiple Vitamins-Minerals (MULTIVITAMIN GUMMIES ADULT) CHEW Chew 2 tablets by mouth 3 (three) times a week.   Yes [provider]  predniSONE  (DELTASONE ) 5 MG tablet Take 2 tablets (10 mg total) by mouth daily with breakfast. Patient taking differently: Take 5 mg by mouth daily with breakfast. 03/01/23  Yes Rice, Lonni ORN, MD  zolpidem  (AMBIEN ) 10 MG tablet TAKE 1/2 TO 1  TABLET BY  MOUTH AT BEDTIME AS NEEDED  FOR SLEEP Patient taking differently: Take 5-10 mg by mouth at bedtime as needed for sleep. 03/23/21  Yes Hilts, Ozell, MD    Current Facility-Administered Medications  Medication Dose Route Frequency Provider Last Rate Last Admin   0.9 %  sodium chloride  infusion   Intravenous Continuous Sebastian Toribio GAILS, MD       acetaminophen  (TYLENOL ) tablet 650 mg  650 mg Oral Q6H PRN Zella, Mir M, MD       Or   acetaminophen  (TYLENOL ) suppository 650 mg  650 mg Rectal Q6H PRN Zella, Mir M, MD       albuterol  (PROVENTIL ) (2.5 MG/3ML) 0.083% nebulizer solution 2.5 mg  2.5 mg Nebulization Q2H PRN Zella, Mir M, MD       NOREEN ON 03/30/2024] cefTRIAXone  (ROCEPHIN ) 2 g in sodium chloride  0.9 % 100 mL IVPB  2 g Intravenous Q24H Dennise Kingsley, MD       clonazePAM  (KLONOPIN ) tablet 0.5 mg  0.5 mg Oral BID PRN Sebastian Toribio GAILS, MD       DULoxetine  (CYMBALTA ) DR capsule 30 mg  30 mg Oral Daily Sebastian Toribio GAILS, MD   30 mg at 03/29/24 0945   eletriptan  (RELPAX ) tablet 40 mg  40 mg Oral Daily PRN Sebastian Toribio GAILS, MD       enalapril  (VASOTEC ) tablet 2.5 mg  2.5 mg Oral Daily Sebastian Toribio GAILS, MD   2.5 mg at 03/29/24 0945   gabapentin  (NEURONTIN ) capsule 300 mg  300 mg Oral BID Chavez, Abigail, NP   300 mg at 03/29/24 9164   heparin  injection 5,000 Units  5,000 Units Subcutaneous Q8H Zella Mir M, MD   5,000 Units at 03/29/24 9378   hydroxychloroquine  (PLAQUENIL ) tablet 200 mg  200 mg Oral Daily Ikramullah, Mir M, MD   200 mg at 03/29/24 9164   loratadine  (CLARITIN ) tablet 10 mg  10 mg Oral Daily Sebastian Toribio GAILS, MD       multivitamin with minerals tablet 1 tablet  1 tablet Oral Once per day on Monday Wednesday Friday Sebastian Toribio GAILS, MD       ondansetron  (ZOFRAN ) tablet 4 mg  4 mg Oral Q6H PRN Zella Katha HERO, MD       Or   ondansetron  (ZOFRAN ) injection 4 mg  4 mg Intravenous Q6H PRN Zella, Mir M, MD   4 mg at 03/28/24 2117    traZODone  (DESYREL ) tablet 25 mg  25 mg Oral QHS PRN Zella Katha HERO, MD   25 mg at 03/28/24 2117    Allergies as of 03/28/2024 - Review Complete 03/28/2024  Allergen Reaction Noted   Imitrex  [sumatriptan ] Swelling and Other (See Comments) 03/30/2021   Latex Itching 01/01/2014   Ibuprofen Other (See Comments) 03/30/2021   Nitrofurantoin Rash 01/01/2014   Oseltamivir Nausea And Vomiting 05/06/2013    Family History  Problem Relation Age of Onset   Rheum arthritis Mother    Heart disease Mother        Pacemaker   COPD Mother  Smoker   Skin cancer Mother    Emphysema Mother    Heart Problems Mother    Stroke Father    Polycystic kidney disease Father    Healthy Sister    Arthritis Brother    Healthy Brother    Alzheimer's disease Maternal Grandmother 36   Diabetes Paternal Grandfather    Skin cancer Paternal Grandfather    Cancer Paternal Grandfather    Healthy Daughter    Breast cancer Neg Hx    Colon cancer Neg Hx     Social History   Socioeconomic History   Marital status: Married    Spouse name: Not on file   Number of children: 1   Years of education: Not on file   Highest education level: Not on file  Occupational History   Not on file  Tobacco Use   Smoking status: Never    Passive exposure: Past   Smokeless tobacco: Never  Vaping Use   Vaping status: Never Used  Substance and Sexual Activity   Alcohol use: Not Currently    Comment: 1 yearly   Drug use: Never   Sexual activity: Not on file  Other Topics Concern   Not on file  Social History Narrative   Right Handed    Lives in a two story home    Social Drivers of Health   Financial Resource Strain: Not on file  Food Insecurity: No Food Insecurity (03/28/2024)   Hunger Vital Sign    Worried About Running Out of Food in the Last Year: Never true    Ran Out of Food in the Last Year: Never true  Transportation Needs: No Transportation Needs (03/28/2024)   PRAPARE - Doctor, general practice (Medical): No    Lack of Transportation (Non-Medical): No  Physical Activity: Not on file  Stress: Not on file  Social Connections: Unknown (12/12/2021)   Received from [facility]   Social Network    Social Network: Not on file  Intimate Partner Violence: Not At Risk (03/28/2024)   Humiliation, Afraid, Rape, and Kick questionnaire    Fear of Current or Ex-Partner: No    Emotionally Abused: No    Physically Abused: No    Sexually Abused: No    Review of Systems: As per HPI She has rheumatoid arthritis for which she takes 4 mg of prednisone  daily and on Plaquenil   Physical Exam: Vital signs in last 24 hours: Temp:  [98.1 F (36.7 C)-99.2 F (37.3 C)] 98.1 F (36.7 C) (08/29 0504) Pulse Rate:  [80-92] 86 (08/29 0504) Resp:  [17-20] 17 (08/29 0504) BP: (134-144)/(71-89) 138/71 (08/29 0504) SpO2:  [97 %-100 %] 100 % (08/29 0504) Last BM Date : 03/28/24  General:   Alert,  Well-developed, well-nourished, pleasant and cooperative in NAD Head:  Normocephalic and atraumatic. Eyes:  Sclera clear, no icterus.   Conjunctiva pink. Ears:  Normal auditory acuity. Nose:  No deformity, discharge,  or lesions. Mouth:  No deformity or lesions.  Oropharynx pink & moist. Neck:  Supple; no masses or thyromegaly. Lungs:  Clear throughout to auscultation.   No wheezes, crackles, or rhonchi. No acute distress. Heart:  Regular rate and rhythm; no murmurs, clicks, rubs,  or gallops. Extremities:  Without clubbing or edema. Neurologic:  Alert and  oriented x4;  grossly normal neurologically. Skin:  Intact without significant lesions or rashes. Psych:  Alert and cooperative. Normal mood and affect. Abdomen:  Soft, nontender and nondistended. No masses, hepatosplenomegaly or hernias  noted. Normal bowel sounds, without guarding, and without rebound.         Lab Results: Recent Labs    03/27/24 0912 03/28/24 0748 03/29/24 0457  WBC 7.2 6.8 5.0  HGB 11.8* 11.6* 10.3*  HCT  35.6* 35.0* 32.3*  PLT 176 126* 141*   BMET Recent Labs    03/27/24 0912 03/28/24 0748 03/29/24 0457  NA 139 138 141  K 3.7 4.1 3.9  CL 101 100 106  CO2 26 21* 24  GLUCOSE 107* 95 94  BUN 21* 27* 20  CREATININE 2.16* 2.15* 1.77*  CALCIUM 8.9 9.4 8.4*   LFT Recent Labs    03/28/24 0748  PROT 6.5  ALBUMIN 4.0  AST 28  ALT 18  ALKPHOS 88  BILITOT 0.9   PT/INR Recent Labs    03/27/24 0912  LABPROT 13.4  INR 1.0    Studies/Results: CT ABDOMEN PELVIS WO CONTRAST Result Date: 03/28/2024 EXAM: CT ABDOMEN AND PELVIS WITHOUT CONTRAST 03/28/2024 08:19:05 AM TECHNIQUE: CT of the abdomen and pelvis was performed without the administration of intravenous contrast. Multiplanar reformatted images are provided for review. Automated exposure control, iterative reconstruction, and/or weight-based adjustment of the mA/kV was utilized to reduce the radiation dose to as low as reasonably achievable. COMPARISON: 09/26/2023 CLINICAL HISTORY: Abdominal pain, acute, nonlocalized. Patient states she was seen at [facility] yesterday and was called today and told her blood cultures were positive and she needed to come back to be admitted. Diarrhea since yesterday. FINDINGS: LOWER CHEST: Visualized lung bases are clear. LIVER: Innumerable cysts identified throughout the liver. The distribution and morphology of the liver and liver cysts appears unchanged from the prior exam, compatible with polycystic liver disease. GALLBLADDER AND BILE DUCTS: The gallbladder appears normal. Unchanged common bile duct dilatation and mild intrahepatic biliary dilatation. The common bile duct measures up to 1.1 cm, image 49/4. Unchanged from prior exam. No calcified stones identified along the course of the CBD. SPLEEN: Normal size. No focal lesion. PANCREAS: No mass. No ductal dilatation. ADRENAL GLANDS: Normal appearance. No mass. KIDNEYS, URETERS AND BLADDER: Bilateral nephromegaly with innumerable bilateral  kidney cysts, compatible with polycystic kidney disease. Cysts are predominantly fluid attenuation. The largest cyst arises off the inferior pole of the right kidney measuring 6.3 cm, image 46/2. Additional small scattered hyperdense kidney cysts are identified which are technically incompletely characterized without IV contrast but may reflect hemorrhagic or proteinaceous cysts. No signs of obstructive uropathy. No ureteral calculi identified bilaterally. Urinary bladder is normal. GI AND BOWEL: No bowel dilatation to suggest bowel obstruction. No focal bowel inflammatory change identified. The appendix is not identified separate from the right lower quadrant bowel loops. No secondary signs of acute appendicitis noted. Liquid stool noted within the right colon. PERITONEUM AND RETROPERITONEUM: No free fluid or fluid collections. No signs of pneumoperitoneum. VASCULATURE: Normal caliber of the abdominal aorta. LYMPH NODES: No lymphadenopathy. REPRODUCTIVE ORGANS: Uterus and adnexal structures are unremarkable. BONES AND SOFT TISSUES: No acute osseous abnormality. No focal soft tissue abnormality. IMPRESSION: 1. No signs of bowel obstruction or inflammation. 2. Polycystic liver disease with innumerable cysts, unchanged from prior exam. 3. Polycystic kidney disease with bilateral nephromegaly and innumerable cysts, including incompletely characterized hyperdense cysts. 4. Stable common bile duct dilatation (up to 1.1 cm) and mild intrahepatic biliary dilatation, without signs of calcified choledocholithiasis. Electronically signed by: Waddell Calk MD 03/28/2024 08:35 AM EDT RP Workstation: [id number]    Impression: Bacteremia-Klebsiella pneumoniae Prior history of infected liver cyst status  post liver cyst aspiration in 2022 Normal WBC 5, normocytic anemia hemoglobin 10.3, MCV 85.9, platelet 141  CT:  No bowel obstruction or inflammation Polycystic liver disease with innumerable cysts Polycystic kidney  disease with bilateral nephromegaly and innumerable cysts Stable common bile duct dilation up to 1.1 cm with mild intrahepatic biliary ductal dilatation without evidence of choledocholithiasis  Normal LFTs, T. bili 0.9/AST 28/ALT 18/ALP 88 Mild renal impairment, BUN 20/creatinine 1.77, GFR 33  Plan: MRI abdomen and MRCP has been ordered by primary team. If needed, we may need to involve IR for cyst aspiration of liver or kidney to determine source of infection. Currently on IV ceftriaxone  2 g every 24 hours with plans to follow culture and sensitivity.   LOS: 1 day   Estelita Manas, MD  03/29/2024, 1:56 PM

## 2024-03-29 NOTE — Plan of Care (Signed)

## 2024-03-29 NOTE — Plan of Care (Signed)

## 2024-03-29 NOTE — Consult Note (Signed)
 [facility]    Date of Admission:  03/28/2024   Total days of inpatient antibiotics 1        Reason for Consult:     Principal Problem:   Bacteremia due to Klebsiella pneumoniae   Assessment: 58 year old female with polycystic kidneys and liver, history of sepsis secondary unclear origin, RA on prednisone  5 mg but prescribed 10 mg/day and Plaquenil , History of infected liver cyst by Klebsiella and 2022 status post treatment called back due to blood cultures growing  #Klebsiella pneumonia bacteremia secondary to GI source - Patient presented to the ED on 8/27 with chills overnight.  She was given dose cefepime  and metronidazole .  She had some antibiotics given by ID at some point that she was planning to take.  Called back as blood cultures grew Klebsiella.  8/27 blood culture 2/2 Klebsiella pneumonia - CT abdomen pelvis without contrast showed polycystic liver disease with innumerable cysts unchanged from prior exam, polycystic kidney disease bilateral pleural nephromegaly.  Stable common duct dilatation and mild intrahepatic biliary dilatation. - UA on 8/27 was negative nitrites negative leukocytes. - Suspect bacteremia secondary to GI source given altered anatomy of multiple source and history of infected cysts with same organism.   Recommendations - Discontinue cefepime  - Start ceftriaxone . Follow blood Cx for speciation - Obtain MRCP, day GI.  I reviewed images back in 2022 for the hospitalization infected liver cyst.  CT abdomen pelvis did not show loculation but was caught on MRCP.  Discussed with primary  #Diarrhea - Developed diarrhea following cefepime  and metronidazole  administered in the ED.  Multiple watery loose stools today.  C. difficile is negative, await GIP - Start probiotics -Enteric precautions  Dr. Fleeta Rothman  is covering the service this weekend.  Evaluation of this patient requires complex antimicrobial therapy evaluation and  counseling + isolation needs for disease transmission risk assessment and mitigation   Microbiology:   Antibiotics: Cefepime  8/27-present  Cultures: Blood 8/27 2/2 Kleb pneumoniae 8/28ng Urine  Other   HPI: Judy Manning is a 58 y.o. female with past medical history of polycystic kidney and liver, history of sepsis of unclear origin, RA on chronic steroids and Plaquenil  been admitted to the hospital Klebsiella bacteremia.  Patient reports she was in her normal state of health until overnight developed chills.  She denies any abdominal symptoms, dysuria.  She was seen in the ED blood cultures were drawn, administered cefepime  and metronidazole .  It was noted that she took an oral antibiotic that she was previously prescribed by ID following ED visit.  Called back as blood cultures grew Klebsiella pneumonia.   Review of Systems: Review of Systems  All other systems reviewed and are negative.   Past Medical History:  Diagnosis Date   COVID 12/2020   Detached retina    Liver cyst 05/03/2021   Liver, polycystic with compressive symptoms 05/03/2021   Polycystic kidney disease    Sepsis (HCC)     Social History   Tobacco Use   Smoking status: Never    Passive exposure: Past   Smokeless tobacco: Never  Vaping Use   Vaping status: Never Used  Substance Use Topics   Alcohol use: Not Currently    Comment: 1 yearly   Drug use: Never    Family History  Problem Relation Age of Onset   Rheum arthritis Mother    Heart disease Mother        Pacemaker  COPD Mother        Smoker   Skin cancer Mother    Emphysema Mother    Heart Problems Mother    Stroke Father    Polycystic kidney disease Father    Healthy Sister    Arthritis Brother    Healthy Brother    Alzheimer's disease Maternal Grandmother 79   Diabetes Paternal Grandfather    Skin cancer Paternal Grandfather    Cancer Paternal Grandfather    Healthy Daughter    Breast cancer Neg Hx    Colon cancer Neg Hx     Scheduled Meds:  DULoxetine   30 mg Oral Daily   enalapril   2.5 mg Oral Daily   gabapentin   300 mg Oral BID   heparin   5,000 Units Subcutaneous Q8H   hydroxychloroquine   200 mg Oral Daily   loratadine   10 mg Oral Daily   multivitamin with minerals  1 tablet Oral Once per day on Monday Wednesday Friday   Continuous Infusions:  [START ON 03/30/2024] cefTRIAXone  (ROCEPHIN )  IV     PRN Meds:.acetaminophen  **OR** acetaminophen , albuterol , clonazePAM , eletriptan , ondansetron  **OR** ondansetron  (ZOFRAN ) IV, traZODone  Allergies  Allergen Reactions   Imitrex  [Sumatriptan ] Swelling and Other (See Comments)    Throat gets tight   Latex Itching   Ibuprofen Other (See Comments)    Polycystic kidney disease   Nitrofurantoin Rash   Oseltamivir Nausea And Vomiting    OBJECTIVE: Blood pressure 138/71, pulse 86, temperature 98.1 F (36.7 C), temperature source Oral, resp. rate 17, height 5' (1.524 m), weight 55.3 kg, SpO2 100%.  Physical Exam Constitutional:      Appearance: Normal appearance.  HENT:     Head: Normocephalic and atraumatic.     Right Ear: Tympanic membrane normal.     Left Ear: Tympanic membrane normal.     Nose: Nose normal.     Mouth/Throat:     Mouth: Mucous membranes are moist.  Eyes:     Extraocular Movements: Extraocular movements intact.     Conjunctiva/sclera: Conjunctivae normal.     Pupils: Pupils are equal, round, and reactive to light.  Cardiovascular:     Rate and Rhythm: Normal rate and regular rhythm.     Heart sounds: No murmur heard.    No friction rub. No gallop.  Pulmonary:     Effort: Pulmonary effort is normal.     Breath sounds: Normal breath sounds.  Abdominal:     General: Abdomen is flat.     Palpations: Abdomen is soft.  Musculoskeletal:        General: Normal range of motion.  Skin:    General: Skin is warm and dry.  Neurological:     General: No focal deficit present.     Mental Status: She is alert and oriented to person, place,  and time.  Psychiatric:        Mood and Affect: Mood normal.     Lab Results Lab Results  Component Value Date   WBC 5.0 03/29/2024   HGB 10.3 (L) 03/29/2024   HCT 32.3 (L) 03/29/2024   MCV 85.9 03/29/2024   PLT 141 (L) 03/29/2024    Lab Results  Component Value Date   CREATININE 1.77 (H) 03/29/2024   BUN 20 03/29/2024   NA 141 03/29/2024   K 3.9 03/29/2024   CL 106 03/29/2024   CO2 24 03/29/2024    Lab Results  Component Value Date   ALT 18 03/28/2024   AST 28 03/28/2024   ALKPHOS  88 03/28/2024   BILITOT 0.9 03/28/2024       Loney Stank, MD [facility] 03/29/2024, 12:54 PM Evaluation of this patient requires complex antimicrobial therapy evaluation and counseling + isolation needs for disease transmission risk assessment and mitigation

## 2024-03-30 ENCOUNTER — Inpatient Hospital Stay (HOSPITAL_COMMUNITY)

## 2024-03-30 ENCOUNTER — Encounter (HOSPITAL_COMMUNITY): Payer: Self-pay

## 2024-03-30 DIAGNOSIS — N179 Acute kidney failure, unspecified: Secondary | ICD-10-CM | POA: Diagnosis not present

## 2024-03-30 DIAGNOSIS — M059 Rheumatoid arthritis with rheumatoid factor, unspecified: Secondary | ICD-10-CM | POA: Diagnosis not present

## 2024-03-30 DIAGNOSIS — R7881 Bacteremia: Secondary | ICD-10-CM | POA: Diagnosis not present

## 2024-03-30 DIAGNOSIS — Q613 Polycystic kidney, unspecified: Secondary | ICD-10-CM | POA: Diagnosis not present

## 2024-03-30 LAB — COMPREHENSIVE METABOLIC PANEL WITH GFR
ALT: 13 U/L (ref 0–44)
AST: 21 U/L (ref 15–41)
Albumin: 3.2 g/dL — ABNORMAL LOW (ref 3.5–5.0)
Alkaline Phosphatase: 62 U/L (ref 38–126)
Anion gap: 11 (ref 5–15)
BUN: 13 mg/dL (ref 6–20)
CO2: 24 mmol/L (ref 22–32)
Calcium: 8.3 mg/dL — ABNORMAL LOW (ref 8.9–10.3)
Chloride: 111 mmol/L (ref 98–111)
Creatinine, Ser: 1.6 mg/dL — ABNORMAL HIGH (ref 0.44–1.00)
GFR, Estimated: 38 mL/min — ABNORMAL LOW (ref >60)
Glucose, Bld: 89 mg/dL (ref 70–99)
Potassium: 4.4 mmol/L (ref 3.5–5.1)
Sodium: 145 mmol/L (ref 135–145)
Total Bilirubin: 0.3 mg/dL (ref 0.0–1.2)
Total Protein: 5.3 g/dL — ABNORMAL LOW (ref 6.5–8.1)

## 2024-03-30 LAB — CBC WITH DIFFERENTIAL/PLATELET
Abs Immature Granulocytes: 0.01 K/uL (ref 0.00–0.07)
Basophils Absolute: 0 K/uL (ref 0.0–0.1)
Basophils Relative: 1 %
Eosinophils Absolute: 0.1 K/uL (ref 0.0–0.5)
Eosinophils Relative: 4 %
HCT: 31.4 % — ABNORMAL LOW (ref 36.0–46.0)
Hemoglobin: 9.7 g/dL — ABNORMAL LOW (ref 12.0–15.0)
Immature Granulocytes: 0 %
Lymphocytes Relative: 23 %
Lymphs Abs: 0.7 K/uL (ref 0.7–4.0)
MCH: 26.8 pg (ref 26.0–34.0)
MCHC: 30.9 g/dL (ref 30.0–36.0)
MCV: 86.7 fL (ref 80.0–100.0)
Monocytes Absolute: 0.3 K/uL (ref 0.1–1.0)
Monocytes Relative: 10 %
Neutro Abs: 2.1 K/uL (ref 1.7–7.7)
Neutrophils Relative %: 62 %
Platelets: 123 K/uL — ABNORMAL LOW (ref 150–400)
RBC: 3.62 MIL/uL — ABNORMAL LOW (ref 3.87–5.11)
RDW: 14 % (ref 11.5–15.5)
WBC: 3.3 K/uL — ABNORMAL LOW (ref 4.0–10.5)
nRBC: 0 % (ref 0.0–0.2)

## 2024-03-30 LAB — MAGNESIUM: Magnesium: 1.6 mg/dL — ABNORMAL LOW (ref 1.7–2.4)

## 2024-03-30 LAB — IRON AND TIBC
Iron: 30 ug/dL (ref 28–170)
Saturation Ratios: 13 % (ref 10.4–31.8)
TIBC: 227 ug/dL — ABNORMAL LOW (ref 250–450)
UIBC: 197 ug/dL

## 2024-03-30 LAB — CULTURE, BLOOD (ROUTINE X 2)
Special Requests: ADEQUATE
Special Requests: ADEQUATE

## 2024-03-30 LAB — FOLATE: Folate: >20 ng/mL (ref >5.9)

## 2024-03-30 LAB — VITAMIN B12: Vitamin B-12: 954 pg/mL — ABNORMAL HIGH (ref 180–914)

## 2024-03-30 LAB — FERRITIN: Ferritin: 80 ng/mL (ref 11–307)

## 2024-03-30 MED ORDER — LOPERAMIDE HCL 2 MG PO CAPS: 2.0000 mg | ORAL_CAPSULE | ORAL | Status: DC | PRN

## 2024-03-30 MED ORDER — MAGNESIUM SULFATE 4 GM/100ML IV SOLN
4.0000 g | Freq: Once | INTRAVENOUS | Status: AC
  Administered 2024-03-30: 4 g via INTRAVENOUS
  Filled 2024-03-30: qty 100

## 2024-03-30 MED ORDER — SODIUM CHLORIDE 0.9 % IV SOLN: INTRAVENOUS | Status: AC

## 2024-03-30 MED ORDER — GADOBUTROL 1 MMOL/ML IV SOLN
6.0000 mL | Freq: Once | INTRAVENOUS | Status: AC | PRN
  Administered 2024-03-30: 6 mL via INTRAVENOUS

## 2024-03-30 MED ORDER — SACCHAROMYCES BOULARDII 250 MG PO CAPS
250.0000 mg | ORAL_CAPSULE | Freq: Two times a day (BID) | ORAL | Status: DC
  Administered 2024-03-30 – 2024-03-31 (×3): 250 mg via ORAL
  Filled 2024-03-30 (×3): qty 1

## 2024-03-30 NOTE — Progress Notes (Signed)
 PROGRESS NOTE    Judy Manning  FMW:[id number] DOB: [birth year] DOA: 03/28/2024 PCP: Hughie Sharper, MD    Chief Complaint  Patient presents with   Abnormal Lab    Brief Narrative:  Patient is a pleasant 58 year old female history of polycystic kidneys and liver, history of sepsis of unclear etiology, rheumatoid arthritis on chronic steroids and Plaquenil  presented to the ED with a Klebsiella Enterobacter bacteremia.  Patient placed empirically on IV antibiotics.  Hospitalist consulted for admission.  ID consulted.   Assessment & Plan:   Principal Problem:   Bacteremia due to Klebsiella pneumoniae Active Problems:   Polycystic kidney disease   Seropositive rheumatoid arthritis (HCC)   AKI (acute kidney injury) (HCC)   Anemia   #1 Klebsiella pneumoniae and Enterobacter bacteremia -Patient noted to have initially presented to the ED with chills, was pancultured discharged home and called back for admission due to positive blood cultures. - Patient also noted to have had diarrhea with onset of symptoms which has since improved after receiving Imodium  per patient. - Source of bacteremia likely GI related. - Patient noted on admission that she did not meet criteria for sepsis. - Repeat blood cultures ordered and pending with no growth to date. -GI pathogen panel negative. -C. difficile PCR negative. -CT abdomen and pelvis done on presentation with no signs of bowel obstruction or inflammation.  Polycystic liver disease, polycystic kidney disease noted.  Stable common bile duct dilatation and mild intrahepatic biliary dilatation, without signs of calcified choledocholithiasis. - Was on IV cefepime  and transitioned to IV Rocephin  per ID. - Patient seen in consultation by ID who recommended MRCP which was done this morning and GI evaluation. -GI has assessed patient and following. - Continue IV fluids, supportive care.  2.  Rheumatoid arthritis -Continue home regimen Plaquenil  and  prednisone .  3.  Polycystic kidneys and liver -Patient with prior hospitalization 2022 with sepsis from a liver abscess. - Outpatient follow-up.  4.  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   - Last creatinine noted on 09/28/2023 at 1.66 on epic.  5.  Anemia - Patient with no overt bleeding. - Hemoglobin stable at 9.7, partly dilutional.. - Anemia panel with iron level of 30, TIBC of 227, folate > 20, vitamin B12 of 954.   - Follow H&H. - Transfusion threshold hemoglobin < 7.  6.  Hypomagnesemia -Magnesium  noted at 1.6. - Magnesium  sulfate 4 g IV x 1. - Repeat labs in AM.  DVT prophylaxis: Heparin  Code Status: Full Family Communication: Updated patient and daughter at bedside.  Disposition: Home when clinically improved and cleared by ID and GI.  Status is: Inpatient Remains inpatient appropriate because: Severity of illness   Consultants:  ID: Dr. Dennise 03/29/2024 GI: Dr. Saintclair 03/29/2024  Procedures:  CT abdomen and pelvis 03/28/2024 MRCP abdomen 03/30/2024  Antimicrobials:  Anti-infectives (From admission, onward)    Start     Dose/Rate Route Frequency Ordered Stop   03/30/24 0700  cefTRIAXone  (ROCEPHIN ) 2 g in sodium chloride  0.9 % 100 mL IVPB        2 g 200 mL/hr over 30 Minutes Intravenous Every 24 hours 03/29/24 0952     03/29/24 1000  hydroxychloroquine  (PLAQUENIL ) tablet 200 mg        200 mg Oral Daily 03/28/24 1310     03/28/24 0900  ceFEPIme  (MAXIPIME ) 2 g in sodium chloride  0.9 % 100 mL IVPB  Status:  Discontinued        2 g 200  mL/hr over 30 Minutes Intravenous Every 24 hours 03/28/24 0834 03/29/24 0952         Subjective: Patient lying in bed.  Daughter at bedside.  Denies any chest pain or shortness of breath.  Denies any abdominal pain.  Denies any further diarrhea.  States had a little bit of chills overnight but has since improved.  Overall feeling better than she did on admission.   Objective: Vitals:   03/29/24 0504 03/29/24 1726  03/29/24 1945 03/30/24 0543  BP: 138/71 130/72 133/67 129/66  Pulse: 86 81 88 82  Resp: 17  17 17   Temp: 98.1 F (36.7 C) 98.5 F (36.9 C) 99.4 F (37.4 C) 98.8 F (37.1 C)  TempSrc: Oral Oral Oral   SpO2: 100% 100% 98% 97%  Weight:      Height:        Intake/Output Summary (Last 24 hours) at 03/30/2024 1252 Last data filed at 03/30/2024 0611 Gross per 24 hour  Intake 1546.21 ml  Output --  Net 1546.21 ml    Filed Weights   03/28/24 1259  Weight: 55.3 kg    Examination:  General exam: NAD. Respiratory system: CTAB.  No wheezes, no crackles, no rhonchi.  Fair movement.  Speaking in full sentences.  Normal respiratory effort.  Cardiovascular system: RRR no murmurs rubs or gallops.  No JVD.  No lower extremity edema.  Gastrointestinal system: Abdomen is soft, nontender, nondistended, positive bowel sounds.  No rebound.  No guarding.  Central nervous system: Alert and oriented. No focal neurological deficits. Extremities: Symmetric 5 x 5 power. Skin: No rashes, lesions or ulcers Psychiatry: Judgement and insight appear normal. Mood & affect appropriate.     Data Reviewed: I have personally reviewed following labs and imaging studies  CBC: Recent Labs  Lab 03/27/24 0912 03/28/24 0748 03/29/24 0457 03/30/24 0508  WBC 7.2 6.8 5.0 3.3*  NEUTROABS 5.9 5.9  --  2.1  HGB 11.8* 11.6* 10.3* 9.7*  HCT 35.6* 35.0* 32.3* 31.4*  MCV 83.0 83.9 85.9 86.7  PLT 176 126* 141* 123*    Basic Metabolic Panel: Recent Labs  Lab 03/27/24 0912 03/28/24 0748 03/29/24 0457 03/30/24 0508  NA 139 138 141 145  K 3.7 4.1 3.9 4.4  CL 101 100 106 111  CO2 26 21* 24 24  GLUCOSE 107* 95 94 89  BUN 21* 27* 20 13  CREATININE 2.16* 2.15* 1.77* 1.60*  CALCIUM 8.9 9.4 8.4* 8.3*  MG  --   --   --  1.6*    GFR: Estimated Creatinine Clearance: 31 mL/min (A) (by C-G formula based on SCr of 1.6 mg/dL (H)).  Liver Function Tests: Recent Labs  Lab 03/27/24 0912 03/28/24 0748  03/30/24 0508  AST 34 28 21  ALT 19 18 13   ALKPHOS 68 88 62  BILITOT 0.5 0.9 0.3  PROT 6.7 6.5 5.3*  ALBUMIN 4.0 4.0 3.2*    CBG: No results for input(s): GLUCAP in the last 168 hours.   Recent Results (from the past 240 hours)  Resp panel by RT-PCR (RSV, Flu A&B, Covid) Anterior Nasal Swab     Status: None   Collection Time: 03/27/24  8:46 AM   Specimen: Anterior Nasal Swab  Result Value Ref Range Status   SARS Coronavirus 2 by RT PCR NEGATIVE NEGATIVE Final    Comment: (NOTE) SARS-CoV-2 target nucleic acids are NOT DETECTED.  The SARS-CoV-2 RNA is generally detectable in upper respiratory specimens during the acute phase of infection. The lowest  concentration of SARS-CoV-2 viral copies this assay can detect is 138 copies/mL. A negative result does not preclude SARS-Cov-2 infection and should not be used as the sole basis for treatment or other patient management decisions. A negative result may occur with  improper specimen collection/handling, submission of specimen other than nasopharyngeal swab, presence of viral mutation(s) within the areas targeted by this assay, and inadequate number of viral copies(<138 copies/mL). A negative result must be combined with clinical observations, patient history, and epidemiological information. The expected result is Negative.  Fact Sheet for Patients:  BloggerCourse.com  Fact Sheet for Healthcare Providers:  SeriousBroker.it  This test is no t yet approved or cleared by the [place]  FDA and  has been authorized for detection and/or diagnosis of SARS-CoV-2 by FDA under an Emergency Use Authorization (EUA). This EUA will remain  in effect (meaning this test can be used) for the duration of the COVID-19 declaration under Section 564(b)(1) of the Act, 21 U.S.C.section 360bbb-3(b)(1), unless the authorization is terminated  or revoked sooner.       Influenza A by PCR  NEGATIVE NEGATIVE Final   Influenza B by PCR NEGATIVE NEGATIVE Final    Comment: (NOTE) The Xpert Xpress SARS-CoV-2/FLU/RSV plus assay is intended as an aid in the diagnosis of influenza from Nasopharyngeal swab specimens and should not be used as a sole basis for treatment. Nasal washings and aspirates are unacceptable for Xpert Xpress SARS-CoV-2/FLU/RSV testing.  Fact Sheet for Patients: BloggerCourse.com  Fact Sheet for Healthcare Providers: SeriousBroker.it  This test is not yet approved or cleared by the [place]  FDA and has been authorized for detection and/or diagnosis of SARS-CoV-2 by FDA under an Emergency Use Authorization (EUA). This EUA will remain in effect (meaning this test can be used) for the duration of the COVID-19 declaration under Section 564(b)(1) of the Act, 21 U.S.C. section 360bbb-3(b)(1), unless the authorization is terminated or revoked.     Resp Syncytial Virus by PCR NEGATIVE NEGATIVE Final    Comment: (NOTE) Fact Sheet for Patients: BloggerCourse.com  Fact Sheet for Healthcare Providers: SeriousBroker.it  This test is not yet approved or cleared by the [place]  FDA and has been authorized for detection and/or diagnosis of SARS-CoV-2 by FDA under an Emergency Use Authorization (EUA). This EUA will remain in effect (meaning this test can be used) for the duration of the COVID-19 declaration under Section 564(b)(1) of the Act, 21 U.S.C. section 360bbb-3(b)(1), unless the authorization is terminated or revoked.  Performed at [facility], [address] [place], [place] [address]   Blood Culture (routine x 2)     Status: Abnormal   Collection Time: 03/27/24  9:04 AM   Specimen: BLOOD LEFT FOREARM  Result Value Ref Range Status   Specimen Description   Final    BLOOD LEFT FOREARM Performed at [facility], [address], [place], [place] [address]    Special Requests   Final    BOTTLES DRAWN AEROBIC AND ANAEROBIC Blood Culture adequate volume Performed at [facility], [address] [place], [place] [address]    Culture  Setup Time   Final    GRAM NEGATIVE RODS ANAEROBIC BOTTLE ONLY CRITICAL RESULT CALLED TO, READ BACK BY AND VERIFIED WITH: RN ALFRED [phone] FCP Performed at [facility], [address], [place], [place] [address]    Culture KLEBSIELLA PNEUMONIAE (A)  Final   Report Status 03/30/2024 FINAL  Final  Organism ID, Bacteria KLEBSIELLA PNEUMONIAE  Final      Susceptibility   Klebsiella pneumoniae - MIC*    AMPICILLIN RESISTANT Resistant     CEFAZOLIN (NON-URINE) 2 SENSITIVE Sensitive     CEFEPIME  <=0.12 SENSITIVE Sensitive     ERTAPENEM <=0.12 SENSITIVE Sensitive     CEFTRIAXONE  <=0.25 SENSITIVE Sensitive     CIPROFLOXACIN  <=0.06 SENSITIVE Sensitive     GENTAMICIN <=1 SENSITIVE Sensitive     MEROPENEM <=0.25 SENSITIVE Sensitive     TRIMETH/SULFA <=20 SENSITIVE Sensitive     AMPICILLIN/SULBACTAM 4 SENSITIVE Sensitive     PIP/TAZO Value in next row Sensitive ug/mL     <=4 SENSITIVEThis is a modified FDA-approved test that has been validated and its performance characteristics determined by the reporting laboratory.  This laboratory is certified under the Clinical Laboratory Improvement Amendments CLIA as qualified to perform high complexity clinical laboratory testing.    * KLEBSIELLA PNEUMONIAE  Blood Culture ID Panel (Reflexed)     Status: Abnormal   Collection Time: 03/27/24  9:04 AM  Result Value Ref Range Status   Enterococcus faecalis NOT DETECTED NOT DETECTED Final   Enterococcus Faecium NOT DETECTED NOT DETECTED Final   Listeria monocytogenes NOT DETECTED NOT DETECTED Final   Staphylococcus species NOT DETECTED NOT DETECTED Final   Staphylococcus aureus (BCID) NOT DETECTED NOT DETECTED Final   Staphylococcus epidermidis NOT DETECTED NOT DETECTED Final    Staphylococcus lugdunensis NOT DETECTED NOT DETECTED Final   Streptococcus species NOT DETECTED NOT DETECTED Final   Streptococcus agalactiae NOT DETECTED NOT DETECTED Final   Streptococcus pneumoniae NOT DETECTED NOT DETECTED Final   Streptococcus pyogenes NOT DETECTED NOT DETECTED Final   A.calcoaceticus-baumannii NOT DETECTED NOT DETECTED Final   Bacteroides fragilis NOT DETECTED NOT DETECTED Final   Enterobacterales DETECTED (A) NOT DETECTED Final    Comment: Enterobacterales represent a large order of gram negative bacteria, not a single organism. CRITICAL RESULT CALLED TO, READ BACK BY AND VERIFIED WITH: RN ALFRED [phone] FCP    Enterobacter cloacae complex NOT DETECTED NOT DETECTED Final   Escherichia coli NOT DETECTED NOT DETECTED Final   Klebsiella aerogenes NOT DETECTED NOT DETECTED Final   Klebsiella oxytoca NOT DETECTED NOT DETECTED Final   Klebsiella pneumoniae DETECTED (A) NOT DETECTED Final    Comment: CRITICAL RESULT CALLED TO, READ BACK BY AND VERIFIED WITH: RN ALFRED [phone] FCP    Proteus species NOT DETECTED NOT DETECTED Final   Salmonella species NOT DETECTED NOT DETECTED Final   Serratia marcescens NOT DETECTED NOT DETECTED Final   Haemophilus influenzae NOT DETECTED NOT DETECTED Final   Neisseria meningitidis NOT DETECTED NOT DETECTED Final   Pseudomonas aeruginosa NOT DETECTED NOT DETECTED Final   Stenotrophomonas maltophilia NOT DETECTED NOT DETECTED Final   Candida albicans NOT DETECTED NOT DETECTED Final   Candida auris NOT DETECTED NOT DETECTED Final   Candida glabrata NOT DETECTED NOT DETECTED Final   Candida krusei NOT DETECTED NOT DETECTED Final   Candida parapsilosis NOT DETECTED NOT DETECTED Final   Candida tropicalis NOT DETECTED NOT DETECTED Final   Cryptococcus neoformans/gattii NOT DETECTED NOT DETECTED Final   CTX-M ESBL NOT DETECTED NOT DETECTED Final   Carbapenem resistance IMP NOT DETECTED NOT DETECTED Final   Carbapenem  resistance KPC NOT DETECTED NOT DETECTED Final   Carbapenem resistance NDM NOT DETECTED NOT DETECTED Final   Carbapenem resist OXA 48 LIKE NOT DETECTED NOT DETECTED Final   Carbapenem resistance VIM NOT DETECTED NOT DETECTED Final  Comment: Performed at [facility], [address] [place], [place] [address]  Blood Culture (routine x 2)     Status: Abnormal   Collection Time: 03/27/24  9:09 AM   Specimen: BLOOD RIGHT FOREARM  Result Value Ref Range Status   Specimen Description BLOOD RIGHT FOREARM  Final   Special Requests   Final    BOTTLES DRAWN AEROBIC AND ANAEROBIC Blood Culture adequate volume   Culture  Setup Time   Final    GRAM NEGATIVE RODS AEROBIC BOTTLE ONLY CRITICAL VALUE NOTED.  VALUE IS CONSISTENT WITH PREVIOUSLY REPORTED AND CALLED VALUE.    Culture (A)  Final    KLEBSIELLA PNEUMONIAE SUSCEPTIBILITIES PERFORMED ON PREVIOUS CULTURE WITHIN THE LAST 5 DAYS.    Report Status 03/30/2024 FINAL  Final  C Difficile Quick Screen w PCR reflex     Status: None   Collection Time: 03/28/24  7:48 AM   Specimen: STOOL  Result Value Ref Range Status   C Diff antigen NEGATIVE NEGATIVE Final   C Diff toxin NEGATIVE NEGATIVE Final   C Diff interpretation No C. difficile detected.  Final    Comment: Performed at [facility], [address], [place], [place] [address]  Gastrointestinal Panel by PCR , Stool     Status: None   Collection Time: 03/28/24  7:48 AM   Specimen: STOOL  Result Value Ref Range Status   Campylobacter species NOT DETECTED NOT DETECTED Final   Plesimonas shigelloides NOT DETECTED NOT DETECTED Final   Salmonella species NOT DETECTED NOT DETECTED Final   Yersinia enterocolitica NOT DETECTED NOT DETECTED Final   Vibrio species NOT DETECTED NOT DETECTED Final   Vibrio cholerae NOT DETECTED NOT DETECTED Final   Enteroaggregative E coli (EAEC) NOT DETECTED NOT DETECTED Final   Enteropathogenic E coli (EPEC) NOT DETECTED NOT DETECTED Final    Enterotoxigenic E coli (ETEC) NOT DETECTED NOT DETECTED Final   Shiga like toxin producing E coli (STEC) NOT DETECTED NOT DETECTED Final   Shigella/Enteroinvasive E coli (EIEC) NOT DETECTED NOT DETECTED Final   Cryptosporidium NOT DETECTED NOT DETECTED Final   Cyclospora cayetanensis NOT DETECTED NOT DETECTED Final   Entamoeba histolytica NOT DETECTED NOT DETECTED Final   Giardia lamblia NOT DETECTED NOT DETECTED Final   Adenovirus F40/41 NOT DETECTED NOT DETECTED Final   Astrovirus NOT DETECTED NOT DETECTED Final   Norovirus GI/GII NOT DETECTED NOT DETECTED Final   Rotavirus A NOT DETECTED NOT DETECTED Final   Sapovirus (I, II, IV, and V) NOT DETECTED NOT DETECTED Final    Comment: Performed at [facility], [address] [place], [place] [address]  Culture, blood (Routine X 2) w Reflex to ID Panel     Status: None (Preliminary result)   Collection Time: 03/28/24  1:53 PM   Specimen: BLOOD LEFT ARM  Result Value Ref Range Status   Specimen Description BLOOD LEFT ARM  Final   Special Requests   Final    BOTTLES DRAWN AEROBIC AND ANAEROBIC Blood Culture adequate volume   Culture   Final    NO GROWTH 2 DAYS Performed at [facility], [address], [place], [place] [address]    Report Status PENDING  Incomplete  Culture, blood (Routine X 2) w Reflex to ID Panel     Status: None (Preliminary result)   Collection Time: 03/28/24  1:58 PM   Specimen: BLOOD LEFT ARM  Result Value Ref Range Status   Specimen Description BLOOD  LEFT ARM  Final   Special Requests   Final    BOTTLES DRAWN AEROBIC AND ANAEROBIC Blood Culture adequate volume   Culture   Final    NO GROWTH 2 DAYS Performed at [facility], [address] [place], [place] [address]    Report Status PENDING  Incomplete         Radiology Studies: No results found.       Scheduled Meds:  DULoxetine   30 mg Oral Daily   enalapril   2.5 mg Oral Daily   gabapentin   300 mg Oral BID   heparin    5,000 Units Subcutaneous Q8H   hydroxychloroquine   200 mg Oral Daily   loratadine   10 mg Oral Daily   multivitamin with minerals  1 tablet Oral Once per day on Monday Wednesday Friday   saccharomyces boulardii  250 mg Oral BID   Continuous Infusions:  sodium chloride  100 mL/hr at 03/30/24 0932   cefTRIAXone  (ROCEPHIN )  IV 2 g (03/30/24 0638)     LOS: 2 days    Time spent: 40 minutes    Toribio Hummer, MD [facility]   To contact the attending provider between 7A-7P or the covering provider during after hours 7P-7A, please log into the web site [email or web address] and access using universal [place] password for that web site. If you do not have the password, please call the hospital operator.  03/30/2024, 12:52 PM

## 2024-03-30 NOTE — TOC Initial Note (Signed)
 Transition of Care [facility]) - Initial/Assessment Note    Patient Details  Name: Judy Manning MRN: [id number] Date of Birth: 04-16-1966  Transition of [facility] CM/SW Contact:    Sheri ONEIDA Sharps, LCSW Phone Number: 03/30/2024, 1:24 PM  Clinical Narrative:                 Pt from home alone. Pt continues medical workup. TOC following for dc needs.   Expected Discharge Plan: Skilled Nursing Facility Barriers to Discharge: Continued Medical Work up   Patient Goals and CMS Choice Patient states their goals for this hospitalization and ongoing recovery are:: return home   Choice offered to / list presented to : NA      Expected Discharge Plan and Services In-house Referral: NA Discharge Planning Services: NA   Living arrangements for the past 2 months: Single Family Home                 DME Arranged: N/A DME Agency: NA       HH Arranged: NA HH Agency: NA        Prior Living Arrangements/Services Living arrangements for the past 2 months: Single Family Home Lives with:: Self Patient language and need for interpreter reviewed:: Yes Do you feel safe going back to the place where you live?: Yes      Need for Family Participation in Patient Care: Yes (Comment) Care giver support system in place?: Yes (comment)   Criminal Activity/Legal Involvement Pertinent to Current Situation/Hospitalization: No - Comment as needed  Activities of Daily Living   ADL Screening (condition at time of admission) Independently performs ADLs?: Yes (appropriate for developmental age) Is the patient deaf or have difficulty hearing?: No Does the patient have difficulty seeing, even when wearing glasses/contacts?: No Does the patient have difficulty concentrating, remembering, or making decisions?: No  Permission Sought/Granted                  Emotional Assessment Appearance:: Appears stated age Attitude/Demeanor/Rapport: Engaged Affect (typically observed):  Accepting Orientation: : Oriented to Situation, Oriented to  Time, Oriented to Place, Oriented to Self Alcohol / Substance Use: Not Applicable Psych Involvement: No (comment)  Admission diagnosis:  Bacteremia due to Klebsiella pneumoniae [R78.81, B96.1] Patient Active Problem List   Diagnosis Date Noted   AKI (acute kidney injury) (HCC) 03/29/2024   Anemia 03/29/2024   Bacteremia due to Klebsiella pneumoniae 03/28/2024   Acne 01/09/2024   Hypotension 09/27/2023   Sepsis due to undetermined organism (HCC) 09/26/2023   Toenail deformity 06/28/2022   Liver cyst 05/03/2021   Liver, polycystic with compressive symptoms 05/03/2021   SIRS (systemic inflammatory response syndrome) (HCC) 04/08/2021   Right upper quadrant pain 04/08/2021   Common bile duct dilatation 04/08/2021   History of migraine headaches 04/08/2021   Hypokalemia, inadequate intake 04/08/2021   Hyponatremia 04/08/2021   Other hyperlipidemia 06/17/2020   History of vitamin D  deficiency 06/17/2020   Endometrial cells on cervical Pap smear inconsistent w/LMP 09/18/2018   Fibroid 09/18/2018   Menorrhagia with irregular cycle 09/18/2018   History of basal cell carcinoma (BCC) 03/23/2018   Seropositive rheumatoid arthritis (HCC) 03/23/2018   Other fatigue 03/23/2018   Ganglion cyst 10/31/2017   Bilateral carpal tunnel syndrome 06/18/2017   Recurrent UTI 07/30/2013   Migraine 05/06/2013   Polycystic kidney disease 05/06/2013   PCP:  Hughie Sharper, MD Pharmacy:   [facility] [id number] - [place], [place] - [address] [place] [address]  Phone: [phone] Fax: [phone]     Social Drivers of Health (SDOH) Social History: SDOH Screenings   Food Insecurity: No Food Insecurity (03/28/2024)  Housing: Low Risk  (03/28/2024)  Transportation Needs: No Transportation Needs (03/28/2024)  Utilities: Not At Risk (03/28/2024)  Social Connections: Unknown (12/12/2021)   Received from  [facility]  Tobacco Use: Low Risk  (03/28/2024)   SDOH Interventions:     Readmission Risk Interventions    03/30/2024    1:23 PM  Readmission Risk Prevention Plan  Transportation Screening Complete  PCP or Specialist Appt within 5-7 Days Complete  Home Care Screening Complete  Medication Review (RN CM) Complete

## 2024-03-30 NOTE — Plan of Care (Signed)

## 2024-03-30 NOTE — Progress Notes (Signed)
      MRI/MRCP  Without acute findings  Continue ceftriaxone  at present.   Judy Manning 03/30/2024, 1:26 PM

## 2024-03-30 NOTE — Progress Notes (Signed)
 Subjective: Complains of mild headache and upper back pain.  Does not have nausea or vomiting.  Denies chills or rigors.  Last bowel movement was yesterday.  Denies abdominal pain.  Objective: Vital signs in last 24 hours: Temp:  [98.5 F (36.9 C)-99.4 F (37.4 C)] 98.8 F (37.1 C) (08/30 0543) Pulse Rate:  [81-88] 82 (08/30 0543) Resp:  [17] 17 (08/30 0543) BP: (129-133)/(66-72) 129/66 (08/30 0543) SpO2:  [97 %-100 %] 97 % (08/30 0543) Weight change:  Last BM Date : 03/28/24  PE: Slightly anxious and tearful GENERAL: Nonicteric, mild pallor  ABDOMEN: Soft, nondistended, nontender, normal active bowel sounds EXTREMITIES: no deformity, no edema  Lab Results: Results for orders placed or performed during the hospital encounter of 03/28/24 (from the past 48 hours)  Culture, blood (Routine X 2) w Reflex to ID Panel     Status: None (Preliminary result)   Collection Time: 03/28/24  1:53 PM   Specimen: BLOOD LEFT ARM  Result Value Ref Range   Specimen Description BLOOD LEFT ARM    Special Requests      BOTTLES DRAWN AEROBIC AND ANAEROBIC Blood Culture adequate volume   Culture      NO GROWTH 2 DAYS Performed at [facility], [address] [place], [place] [address]    Report Status PENDING   Culture, blood (Routine X 2) w Reflex to ID Panel     Status: None (Preliminary result)   Collection Time: 03/28/24  1:58 PM   Specimen: BLOOD LEFT ARM  Result Value Ref Range   Specimen Description BLOOD LEFT ARM    Special Requests      BOTTLES DRAWN AEROBIC AND ANAEROBIC Blood Culture adequate volume   Culture      NO GROWTH 2 DAYS Performed at [facility], [address], [place], [place] [address]    Report Status PENDING   Basic metabolic panel     Status: Abnormal   Collection Time: 03/29/24  4:57 AM  Result Value Ref Range   Sodium 141 135 - 145 mmol/L   Potassium 3.9 3.5 - 5.1 mmol/L   Chloride 106 98 - 111 mmol/L   CO2 24 22 - 32 mmol/L   Glucose, Bld 94 70  - 99 mg/dL    Comment: Glucose reference range applies only to samples taken after fasting for at least 8 hours.   BUN 20 6 - 20 mg/dL   Creatinine, Ser 8.22 (H) 0.44 - 1.00 mg/dL   Calcium 8.4 (L) 8.9 - 10.3 mg/dL   GFR, Estimated 33 (L) >60 mL/min    Comment: (NOTE) Calculated using the CKD-EPI Creatinine Equation (2021)    Anion gap 11 5 - 15    Comment: Performed at [facility], [address], [place], [place] [address]  CBC     Status: Abnormal   Collection Time: 03/29/24  4:57 AM  Result Value Ref Range   WBC 5.0 4.0 - 10.5 K/uL   RBC 3.76 (L) 3.87 - 5.11 MIL/uL   Hemoglobin 10.3 (L) 12.0 - 15.0 g/dL   HCT 67.6 (L) 63.9 - 53.9 %   MCV 85.9 80.0 - 100.0 fL   MCH 27.4 26.0 - 34.0 pg   MCHC 31.9 30.0 - 36.0 g/dL   RDW 85.9 88.4 - 84.4 %   Platelets 141 (L) 150 - 400 K/uL   nRBC 0.0 0.0 - 0.2 %    Comment: Performed at [facility], [address]  [place], [place] [address]  Comprehensive metabolic panel     Status: Abnormal   Collection Time: 03/30/24  5:08 AM  Result Value Ref Range   Sodium 145 135 - 145 mmol/L   Potassium 4.4 3.5 - 5.1 mmol/L   Chloride 111 98 - 111 mmol/L   CO2 24 22 - 32 mmol/L   Glucose, Bld 89 70 - 99 mg/dL    Comment: Glucose reference range applies only to samples taken after fasting for at least 8 hours.   BUN 13 6 - 20 mg/dL   Creatinine, Ser 8.39 (H) 0.44 - 1.00 mg/dL   Calcium 8.3 (L) 8.9 - 10.3 mg/dL   Total Protein 5.3 (L) 6.5 - 8.1 g/dL   Albumin 3.2 (L) 3.5 - 5.0 g/dL   AST 21 15 - 41 U/L   ALT 13 0 - 44 U/L   Alkaline Phosphatase 62 38 - 126 U/L   Total Bilirubin 0.3 0.0 - 1.2 mg/dL   GFR, Estimated 38 (L) >60 mL/min    Comment: (NOTE) Calculated using the CKD-EPI Creatinine Equation (2021)    Anion gap 11 5 - 15    Comment: Performed at [facility], [address] [place], [place] [address]  CBC with Differential/Platelet     Status: Abnormal   Collection Time:  03/30/24  5:08 AM  Result Value Ref Range   WBC 3.3 (L) 4.0 - 10.5 K/uL   RBC 3.62 (L) 3.87 - 5.11 MIL/uL   Hemoglobin 9.7 (L) 12.0 - 15.0 g/dL   HCT 68.5 (L) 63.9 - 53.9 %   MCV 86.7 80.0 - 100.0 fL   MCH 26.8 26.0 - 34.0 pg   MCHC 30.9 30.0 - 36.0 g/dL   RDW 85.9 88.4 - 84.4 %   Platelets 123 (L) 150 - 400 K/uL    Comment: REPEATED TO VERIFY   nRBC 0.0 0.0 - 0.2 %   Neutrophils Relative % 62 %   Neutro Abs 2.1 1.7 - 7.7 K/uL   Lymphocytes Relative 23 %   Lymphs Abs 0.7 0.7 - 4.0 K/uL   Monocytes Relative 10 %   Monocytes Absolute 0.3 0.1 - 1.0 K/uL   Eosinophils Relative 4 %   Eosinophils Absolute 0.1 0.0 - 0.5 K/uL   Basophils Relative 1 %   Basophils Absolute 0.0 0.0 - 0.1 K/uL   Immature Granulocytes 0 %   Abs Immature Granulocytes 0.01 0.00 - 0.07 K/uL    Comment: Performed at [facility], [address], [place], [place] [address]  Magnesium      Status: Abnormal   Collection Time: 03/30/24  5:08 AM  Result Value Ref Range   Magnesium  1.6 (L) 1.7 - 2.4 mg/dL    Comment: Performed at [facility], [address] [place], [place] [address]  Vitamin B12     Status: Abnormal   Collection Time: 03/30/24  5:08 AM  Result Value Ref Range   Vitamin B-12 954 (H) 180 - 914 pg/mL    Comment: Performed at [facility], [address] [place], [place] [address]  Folate     Status: None   Collection Time: 03/30/24  5:08 AM  Result Value Ref Range   Folate >20.0 >5.9 ng/mL    Comment: Performed at [facility], [address] [place], [place] [address]  Iron and TIBC     Status: Abnormal   Collection Time: 03/30/24  5:08 AM  Result Value Ref  Range   Iron 30 28 - 170 ug/dL   TIBC 772 (L) 749 - 549 ug/dL   Saturation Ratios 13 10.4 - 31.8 %   UIBC 197 ug/dL    Comment: Performed at [facility], [address], [place], [place] [address]    Studies/Results: No results  found.  Medications: I have reviewed the patient's current medications.  Assessment: Klebsiella pneumoniae and Enterobacterales bacteremia on blood culture from 03/27/2024 On ceftriaxone  2 g every 24 hours On IV fluid normal saline at 100 cc an hour Repeat blood culture from 28/28/25 shows no growth in 2 days WBC 3.3 Tmax 99.4 F  Polycystic liver disease, T. bili 0.3/AST 21/ALT 13/ALP 62 Polycystic kidney disease, BUN 13, creatinine 1.6, GFR 38 Low total protein 5.3 and low albumin 3.2 Normocytic anemia, hemoglobin 9.7, slightly low platelet 123  Intermittent diarrhea, on Florastor to 50 mg twice a day and loperamide  2 mg as needed  History of rheumatoid arthritis on Plaquenil  200 mg daily  Plan: Awaiting MRI MRCP.   Previously has history of liver abscess/infection within cyst requiring renal and liver cyst aspiration for culture and sensitivity in 04/2021. Currently doing well on IV antibiotics, depending upon clinical course, and imaging findings, may need liver/kidney cyst aspiration if required.    Judy Manas, MD 03/30/2024, 10:36 AM

## 2024-03-30 NOTE — Plan of Care (Signed)

## 2024-03-31 ENCOUNTER — Telehealth (HOSPITAL_BASED_OUTPATIENT_CLINIC_OR_DEPARTMENT_OTHER): Payer: Self-pay | Admitting: *Deleted

## 2024-03-31 DIAGNOSIS — N179 Acute kidney failure, unspecified: Secondary | ICD-10-CM | POA: Diagnosis not present

## 2024-03-31 DIAGNOSIS — R7881 Bacteremia: Secondary | ICD-10-CM | POA: Diagnosis not present

## 2024-03-31 DIAGNOSIS — Q613 Polycystic kidney, unspecified: Secondary | ICD-10-CM | POA: Diagnosis not present

## 2024-03-31 DIAGNOSIS — M059 Rheumatoid arthritis with rheumatoid factor, unspecified: Secondary | ICD-10-CM | POA: Diagnosis not present

## 2024-03-31 LAB — CBC WITH DIFFERENTIAL/PLATELET
Abs Immature Granulocytes: 0 K/uL (ref 0.00–0.07)
Basophils Absolute: 0 K/uL (ref 0.0–0.1)
Basophils Relative: 1 %
Eosinophils Absolute: 0.2 K/uL (ref 0.0–0.5)
Eosinophils Relative: 5 %
HCT: 31.3 % — ABNORMAL LOW (ref 36.0–46.0)
Hemoglobin: 9.6 g/dL — ABNORMAL LOW (ref 12.0–15.0)
Immature Granulocytes: 0 %
Lymphocytes Relative: 27 %
Lymphs Abs: 0.9 K/uL (ref 0.7–4.0)
MCH: 26.9 pg (ref 26.0–34.0)
MCHC: 30.7 g/dL (ref 30.0–36.0)
MCV: 87.7 fL (ref 80.0–100.0)
Monocytes Absolute: 0.3 K/uL (ref 0.1–1.0)
Monocytes Relative: 10 %
Neutro Abs: 1.8 K/uL (ref 1.7–7.7)
Neutrophils Relative %: 57 %
Platelets: 133 K/uL — ABNORMAL LOW (ref 150–400)
RBC: 3.57 MIL/uL — ABNORMAL LOW (ref 3.87–5.11)
RDW: 13.9 % (ref 11.5–15.5)
WBC: 3.2 K/uL — ABNORMAL LOW (ref 4.0–10.5)
nRBC: 0 % (ref 0.0–0.2)

## 2024-03-31 LAB — RENAL FUNCTION PANEL
Albumin: 3.1 g/dL — ABNORMAL LOW (ref 3.5–5.0)
Anion gap: 11 (ref 5–15)
BUN: 11 mg/dL (ref 6–20)
CO2: 22 mmol/L (ref 22–32)
Calcium: 7.9 mg/dL — ABNORMAL LOW (ref 8.9–10.3)
Chloride: 110 mmol/L (ref 98–111)
Creatinine, Ser: 1.4 mg/dL — ABNORMAL HIGH (ref 0.44–1.00)
GFR, Estimated: 44 mL/min — ABNORMAL LOW (ref >60)
Glucose, Bld: 85 mg/dL (ref 70–99)
Phosphorus: 3 mg/dL (ref 2.5–4.6)
Potassium: 3.7 mmol/L (ref 3.5–5.1)
Sodium: 142 mmol/L (ref 135–145)

## 2024-03-31 LAB — MAGNESIUM: Magnesium: 2.4 mg/dL (ref 1.7–2.4)

## 2024-03-31 MED ORDER — ACETAMINOPHEN 325 MG PO TABS
650.0000 mg | ORAL_TABLET | Freq: Four times a day (QID) | ORAL | Status: AC | PRN
Start: 2024-03-31 — End: ?

## 2024-03-31 MED ORDER — SACCHAROMYCES BOULARDII 250 MG PO CAPS: 250.0000 mg | ORAL_CAPSULE | Freq: Two times a day (BID) | ORAL | 0 refills | Status: DC

## 2024-03-31 MED ORDER — LOPERAMIDE HCL 2 MG PO CAPS: 2.0000 mg | ORAL_CAPSULE | ORAL | Status: DC | PRN

## 2024-03-31 MED ORDER — CEFADROXIL 500 MG PO CAPS: 1000.0000 mg | ORAL_CAPSULE | Freq: Two times a day (BID) | ORAL | 1 refills | Status: DC

## 2024-03-31 NOTE — Plan of Care (Signed)

## 2024-03-31 NOTE — Discharge Summary (Signed)
 Physician Discharge Summary  Judy Manning FMW:[id number] DOB: [birth year] DOA: 03/28/2024  PCP: Hughie Sharper, MD  Admit date: 03/28/2024 Discharge date: 03/31/2024  Time spent: 60 minutes  Recommendations for Outpatient Follow-up:  Follow-up with Hilts, Sharper, MD in 2 weeks.  On follow-up patient will need a basic metabolic profile, magnesium  level done to follow-up on electrolytes and renal function. Follow-up with Dr. Fleeta Rothman, ID on 04/17/2024 at 2 PM.   Discharge Diagnoses:  Principal Problem:   Bacteremia due to Klebsiella pneumoniae Active Problems:   Polycystic kidney disease   Seropositive rheumatoid arthritis (HCC)   AKI (acute kidney injury) (HCC)   Anemia   Discharge Condition: Stable and improved.  Diet recommendation: Regular  Filed Weights   03/28/24 1259  Weight: 55.3 kg    History of present illness:  HPI per Dr. Zella Tawni Mustard is a 58 y.o. female with medical history significant for polycystic kidneys and liver, history of sepsis of unclear origin, RA on chronic steroids and Plaquenil  who is being admitted to the hospital with Klebsiella and Enterobacter bacteremia.  She was hospitalized back in February of this year with sepsis of unclear origin, cultures were negative she was discharged home with empiric antibiotics.  Since that time, she has not been on any antibiotics and has been doing well.  Yesterday morning, she had sudden onset of chills, fever up to 101.8 overnight.  She felt that her blood pressure was fluctuating, and she did not have any other specific localizing symptoms.  She was evaluated in the ER felt to be stable for discharge after blood cultures were drawn.  She was called back to the ER this morning due to positive blood cultures.  In the interim, she has continued to have fevers, chills, and developed copious watery diarrhea.   Hospital Course:  #1 Klebsiella pneumoniae and Enterobacter bacteremia -Patient noted to have  initially presented to the ED with chills, was pancultured discharged home and called back for admission due to positive blood cultures. - Patient also noted to have had diarrhea with onset of symptoms which has since improved after receiving Imodium  per patient. - Source of bacteremia initially felt to be likely GI related. - Patient noted on admission that she did not meet criteria for sepsis. - Repeat blood cultures ordered on admission and with no growth to date x 3 days by day of discharge.   - Patient remained afebrile.  -GI pathogen panel negative. -C. difficile PCR negative. -CT abdomen and pelvis done on presentation with no signs of bowel obstruction or inflammation.  Polycystic liver disease, polycystic kidney disease noted.  Stable common bile duct dilatation and mild intrahepatic biliary dilatation, without signs of calcified choledocholithiasis. - Was on IV cefepime  and transitioned to IV Rocephin  per ID. - Patient seen in consultation by ID who recommended MRCP which was done with no acute findings noted.  - Patient also assessed by GI during the hospitalization - Patient hydrated IV fluids, placed on supportive care.  - Patient improved clinically during the hospitalization, had no fevers, remained afebrile, remained asymptomatic and tolerating antibiotics.  - Patient be discharged home in stable and improved condition on cefadroxil  1 g twice daily x 30 days per ID recommendations.  Patient has been set up with close outpatient follow-up with ID which is scheduled for 04/17/2024 at 2 PM.  - Patient will be discharged in stable and improved condition.     2.  Rheumatoid arthritis - Patient maintained on home regimen Plaquenil  and  prednisone .   3.  Polycystic kidneys and liver -Patient with prior hospitalization 2022 with sepsis from a liver abscess. - Outpatient follow-up.   4.  AKI on CKD 3B - Felt likely secondary to a prerenal azotemia as patient noted to have had some  diarrhea prior to admission.   -CT abdomen and pelvis done on admission with polycystic kidney disease with bilateral nephromegaly and innumerable cysts including incompletely characterized hyperdense cyst.  Negative for hydronephrosis. - Last creatinine noted on 09/28/2023 at 1.66 on epic. -Patient hydrated with IV fluids with improvement with renal function such that by day of discharge creatinine was down to 1.40. - Outpatient follow-up with PCP.   5.  Anemia - Patient with no overt bleeding. - Hemoglobin on presentation noted at 10.3, patient hydrated with IV fluids likely had a slightly dilutional effect, hemoglobin stabilized at 9.6 by day of discharge.   - Patient had no overt bleeding.  - Anemia panel with iron level of 30, TIBC of 227, folate > 20, vitamin B12 of 954.   -Outpatient follow-up with PCP.   6.  Hypomagnesemia - Repleted during the hospitalization.     Procedures: CT abdomen and pelvis 03/28/2024 MRCP abdomen 03/30/2024    Consultations: ID: Dr. Dennise 03/29/2024 GI: Dr. Saintclair 03/29/2024  Discharge Exam: Vitals:   03/31/24 0934 03/31/24 1154  BP: 123/66 132/71  Pulse:  86  Resp:  17  Temp:  98.3 F (36.8 C)  SpO2:  100%    General: NAD Cardiovascular: Regular rate and rhythm no murmurs rubs or gallops.  No JVD.  No lower extremity edema. Respiratory: Clear to auscultation bilaterally.  No wheezes, no crackles, no rhonchi.  Fair air movement.  Speaking in full sentences.  Discharge Instructions   Discharge Instructions     Diet general   Complete by: As directed    Increase activity slowly   Complete by: As directed       Allergies as of 03/31/2024       Reactions   Imitrex  [sumatriptan ] Swelling, Other (See Comments)   Throat gets tight   Latex Itching   Ibuprofen Other (See Comments)   Polycystic kidney disease   Nitrofurantoin Rash   Oseltamivir Nausea And Vomiting        Medication List     STOP taking these medications     cefdinir  300 MG capsule Commonly known as: OMNICEF    metroNIDAZOLE  500 MG tablet Commonly known as: FLAGYL        TAKE these medications    acetaminophen  325 MG tablet Commonly known as: TYLENOL  Take 2 tablets (650 mg total) by mouth every 6 (six) hours as needed for mild pain (pain score 1-3) or fever (or Fever >/= 101).   Aimovig 140 MG/ML Soaj Generic drug: Erenumab-aooe Inject 140 mg into the skin every 28 (twenty-eight) days.   B Complex-B12 Tabs Take 1 tablet by mouth daily as needed (for deficiency).   cefadroxil  500 MG capsule Commonly known as: DURICEF Take 2 capsules (1,000 mg total) by mouth 2 (two) times daily.   cetirizine 10 MG tablet Commonly known as: ZYRTEC Take 10 mg by mouth daily as needed for allergies or rhinitis.   clonazePAM  0.5 MG tablet Commonly known as: KLONOPIN  Take 0.5 mg by mouth 2 (two) times daily as needed for anxiety (or sleep).   DULoxetine  30 MG capsule Commonly known as: Cymbalta  Take 1 capsule (30 mg total) by mouth daily. What changed: when to take this   eletriptan  40  MG tablet Commonly known as: RELPAX  Take 40 mg by mouth daily as needed (for breakthrough migraines- may take an additional 40 mg once, if no relief after an hour - Max of 80 mg/24 hours).   enalapril  2.5 MG tablet Commonly known as: VASOTEC  Take 2.5 mg by mouth daily.   gabapentin  300 MG capsule Commonly known as: NEURONTIN  TAKE 1 CAPSULE BY MOUTH  TWICE DAILY What changed: when to take this   hydroxychloroquine  200 MG tablet Commonly known as: PLAQUENIL  Take 1 tablet (200 mg total) by mouth daily.   loperamide  2 MG capsule Commonly known as: IMODIUM  Take 1 capsule (2 mg total) by mouth as needed for diarrhea or loose stools.   Multivitamin Gummies Adult Chew Chew 2 tablets by mouth 3 (three) times a week.   predniSONE  5 MG tablet Commonly known as: DELTASONE  Take 2 tablets (10 mg total) by mouth daily with breakfast. What changed: how much to  take   saccharomyces boulardii 250 MG capsule Commonly known as: FLORASTOR Take 1 capsule (250 mg total) by mouth 2 (two) times daily.   zolpidem  10 MG tablet Commonly known as: AMBIEN  TAKE 1/2 TO 1 TABLET BY  MOUTH AT BEDTIME AS NEEDED  FOR SLEEP What changed:  reasons to take this additional instructions       Allergies  Allergen Reactions   Imitrex  [Sumatriptan ] Swelling and Other (See Comments)    Throat gets tight   Latex Itching   Ibuprofen Other (See Comments)    Polycystic kidney disease   Nitrofurantoin Rash   Oseltamivir Nausea And Vomiting    Follow-up Information     Hilts, Michael, MD. Schedule an appointment as soon as possible for a visit in 2 week(s).   Specialty: Family Medicine Contact information: [address] [place]  [place] [address] [phone]         Fleeta Rothman, Jomarie SAILOR, MD Follow up on 04/17/2024.   Specialty: Infectious Diseases Why: Follow-up at 2 PM Contact information: [address] [place] [address] [phone]                  The results of significant diagnostics from this hospitalization (including imaging, microbiology, ancillary and laboratory) are listed below for reference.    Significant Diagnostic Studies: MR ABDOMEN MRCP W WO CONTAST Result Date: 03/30/2024 CLINICAL DATA:  58 year old female inpatient with abdominal pain and Klebsiella pneumoniae bacteremia. EXAM: MRI ABDOMEN WITHOUT AND WITH CONTRAST (INCLUDING MRCP) TECHNIQUE: Multiplanar multisequence MR imaging of the abdomen was performed both before and after the administration of intravenous contrast. Heavily T2-weighted images of the biliary and pancreatic ducts were obtained, and three-dimensional MRCP images were rendered by post processing. CONTRAST:  6mL GADAVIST  GADOBUTROL  1 MMOL/ML IV SOLN COMPARISON:  04/09/2021 MRI abdomen.  03/28/2024 CT abdomen/pelvis. FINDINGS: Lower chest: Trace layering bilateral pleural effusions. Bilateral breast  prostheses partially visualized. Hepatobiliary: Liver is mildly enlarged by innumerable liver cysts scattered throughout the liver, including simple and mildly complex liver cysts, a few of which demonstrate internal hemorrhagic/proteinaceous signal, and none of which demonstrate enhancement or wall thickening. Representative 1.6 x 1.3 cm hemorrhagic/proteinaceous segment 3 left liver cyst on series 12/image 39. Representative dominant 4.8 x 4.2 cm segment 7 right liver cyst with minimal layering hemorrhagic/proteinaceous material on series 4/image 7. no suspicious liver masses. No hepatic steatosis. Normal gallbladder with no cholelithiasis. Chronic dilatation of the common bile duct with common bile duct diameter 11 mm. Chronic mild diffuse intrahepatic biliary ductal dilatation. The intrahepatic and  extrahepatic biliary ductal dilatation has slightly worsened since 04/08/2021 MRI, as best seen in the right liver on coronal series 3/image 10, with previous common bile duct diameter 10 mm. No choledocholithiasis. No biliary mass, wall thickening or hyperenhancement. No discrete biliary strictures. Pancreas: No pancreatic mass or duct dilation.  No pancreas divisum. Spleen: Normal size. No mass. Adrenals/Urinary Tract: Normal adrenals. No hydronephrosis. 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 No suspicious renal masses identified. Representative 1.5 cm posterior interpolar right kidney (series 12/image 55) and 1.4 cm anterior lower left kidney (series 12/image 67) T1 hyperintense renal lesions without appreciable enhancement compatible with Bosniak category 2 hemorrhagic/proteinaceous renal cysts. Representative dominant 6.6 cm simple inferior right renal cyst. Stomach/Bowel: Normal non-distended stomach. Visualized small and large bowel is normal caliber, with no bowel wall thickening.  Vascular/Lymphatic: Normal caliber abdominal aorta. Patent portal, splenic, hepatic and renal veins. No pathologically enlarged lymph nodes in the abdomen. Other: No abdominal ascites or focal fluid collection. Musculoskeletal: No aggressive appearing focal osseous lesions. IMPRESSION: 1. No acute abnormality. 2. Chronic mild intrahepatic and extrahepatic biliary ductal dilatation, slightly worsened since 04/08/2021 MRI. No choledocholithiasis. No obstructing mass. No discrete biliary strictures. 3. Polycystic kidney and liver disease. No suspicious masses. 4. Trace layering bilateral pleural effusions. Electronically Signed   By: Selinda DELENA Blue M.D.   On: 03/30/2024 12:56   MR 3D Recon At Scanner Result Date: 03/30/2024 CLINICAL DATA:  58 year old female inpatient with abdominal pain and Klebsiella pneumoniae bacteremia. EXAM: MRI ABDOMEN WITHOUT AND WITH CONTRAST (INCLUDING MRCP) TECHNIQUE: Multiplanar multisequence MR imaging of the abdomen was performed both before and after the administration of intravenous contrast. Heavily T2-weighted images of the biliary and pancreatic ducts were obtained, and three-dimensional MRCP images were rendered by post processing. CONTRAST:  6mL GADAVIST  GADOBUTROL  1 MMOL/ML IV SOLN COMPARISON:  04/09/2021 MRI abdomen.  03/28/2024 CT abdomen/pelvis. FINDINGS: Lower chest: Trace layering bilateral pleural effusions. Bilateral breast prostheses partially visualized. Hepatobiliary: Liver is mildly enlarged by innumerable liver cysts scattered throughout the liver, including simple and mildly complex liver cysts, a few of which demonstrate internal hemorrhagic/proteinaceous signal, and none of which demonstrate enhancement or wall thickening. Representative 1.6 x 1.3 cm hemorrhagic/proteinaceous segment 3 left liver cyst on series 12/image 39. Representative dominant 4.8 x 4.2 cm segment 7 right liver cyst with minimal layering hemorrhagic/proteinaceous material on series 4/image 7.  no suspicious liver masses. No hepatic steatosis. Normal gallbladder with no cholelithiasis. Chronic dilatation of the common bile duct with common bile duct diameter 11 mm. Chronic mild diffuse intrahepatic biliary ductal dilatation. The intrahepatic and extrahepatic biliary ductal dilatation has slightly worsened since 04/08/2021 MRI, as best seen in the right liver on coronal series 3/image 10, with previous common bile duct diameter 10 mm. No choledocholithiasis. No biliary mass, wall thickening or hyperenhancement. No discrete biliary strictures. Pancreas: No pancreatic mass or duct dilation.  No pancreas divisum. Spleen: Normal size. No mass. Adrenals/Urinary Tract: Normal adrenals. No hydronephrosis. 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 No suspicious renal masses identified. Representative 1.5 cm posterior interpolar right kidney (series 12/image 55) and 1.4 cm anterior lower left kidney (series 12/image 67) T1 hyperintense renal lesions without appreciable enhancement compatible with Bosniak category 2 hemorrhagic/proteinaceous renal cysts. Representative dominant 6.6 cm simple inferior right renal cyst. Stomach/Bowel: Normal non-distended stomach. Visualized small and large bowel is normal  caliber, with no bowel wall thickening. Vascular/Lymphatic: Normal caliber abdominal aorta. Patent portal, splenic, hepatic and renal veins. No pathologically enlarged lymph nodes in the abdomen. Other: No abdominal ascites or focal fluid collection. Musculoskeletal: No aggressive appearing focal osseous lesions. IMPRESSION: 1. No acute abnormality. 2. Chronic mild intrahepatic and extrahepatic biliary ductal dilatation, slightly worsened since 04/08/2021 MRI. No choledocholithiasis. No obstructing mass. No discrete biliary strictures. 3. Polycystic kidney and liver disease. No suspicious masses. 4.  Trace layering bilateral pleural effusions. Electronically Signed   By: Selinda DELENA Blue M.D.   On: 03/30/2024 12:56   CT ABDOMEN PELVIS WO CONTRAST Result Date: 03/28/2024 EXAM: CT ABDOMEN AND PELVIS WITHOUT CONTRAST 03/28/2024 08:19:05 AM TECHNIQUE: CT of the abdomen and pelvis was performed without the administration of intravenous contrast. Multiplanar reformatted images are provided for review. Automated exposure control, iterative reconstruction, and/or weight-based adjustment of the mA/kV was utilized to reduce the radiation dose to as low as reasonably achievable. COMPARISON: 09/26/2023 CLINICAL HISTORY: Abdominal pain, acute, nonlocalized. Patient states she was seen at [facility] yesterday and was called today and told her blood cultures were positive and she needed to come back to be admitted. Diarrhea since yesterday. FINDINGS: LOWER CHEST: Visualized lung bases are clear. LIVER: Innumerable cysts identified throughout the liver. The distribution and morphology of the liver and liver cysts appears unchanged from the prior exam, compatible with polycystic liver disease. GALLBLADDER AND BILE DUCTS: The gallbladder appears normal. Unchanged common bile duct dilatation and mild intrahepatic biliary dilatation. The common bile duct measures up to 1.1 cm, image 49/4. Unchanged from prior exam. No calcified stones identified along the course of the CBD. SPLEEN: Normal size. No focal lesion. PANCREAS: No mass. No ductal dilatation. ADRENAL GLANDS: Normal appearance. No mass. KIDNEYS, URETERS AND BLADDER: Bilateral nephromegaly with innumerable bilateral kidney cysts, compatible with polycystic kidney disease. Cysts are predominantly fluid attenuation. The largest cyst arises off the inferior pole of the right kidney measuring 6.3 cm, image 46/2. Additional small scattered hyperdense kidney cysts are identified which are technically incompletely characterized without IV contrast but may reflect  hemorrhagic or proteinaceous cysts. No signs of obstructive uropathy. No ureteral calculi identified bilaterally. Urinary bladder is normal. GI AND BOWEL: No bowel dilatation to suggest bowel obstruction. No focal bowel inflammatory change identified. The appendix is not identified separate from the right lower quadrant bowel loops. No secondary signs of acute appendicitis noted. Liquid stool noted within the right colon. PERITONEUM AND RETROPERITONEUM: No free fluid or fluid collections. No signs of pneumoperitoneum. VASCULATURE: Normal caliber of the abdominal aorta. LYMPH NODES: No lymphadenopathy. REPRODUCTIVE ORGANS: Uterus and adnexal structures are unremarkable. BONES AND SOFT TISSUES: No acute osseous abnormality. No focal soft tissue abnormality. IMPRESSION: 1. No signs of bowel obstruction or inflammation. 2. Polycystic liver disease with innumerable cysts, unchanged from prior exam. 3. Polycystic kidney disease with bilateral nephromegaly and innumerable cysts, including incompletely characterized hyperdense cysts. 4. Stable common bile duct dilatation (up to 1.1 cm) and mild intrahepatic biliary dilatation, without signs of calcified choledocholithiasis. Electronically signed by: Waddell Calk MD 03/28/2024 08:35 AM EDT RP Workstation: [id number]   DG Chest Port 1 View Result Date: 03/27/2024 CLINICAL DATA:  Fever, chills, headache, nausea since yesterday. History of sepsis and immunocompromised. EXAM: PORTABLE CHEST 1 VIEW COMPARISON:  09/26/2023 FINDINGS: The heart size and mediastinal contours are within normal limits. Both lungs are clear. The visualized skeletal structures are unremarkable. IMPRESSION: No active disease. Electronically Signed   By: Elspeth Bathe  M.D.   On: 03/27/2024 09:51    Microbiology: Recent Results (from the past 240 hours)  Resp panel by RT-PCR (RSV, Flu A&B, Covid) Anterior Nasal Swab     Status: None   Collection Time: 03/27/24  8:46 AM   Specimen: Anterior Nasal  Swab  Result Value Ref Range Status   SARS Coronavirus 2 by RT PCR NEGATIVE NEGATIVE Final    Comment: (NOTE) SARS-CoV-2 target nucleic acids are NOT DETECTED.  The SARS-CoV-2 RNA is generally detectable in upper respiratory specimens during the acute phase of infection. The lowest concentration of SARS-CoV-2 viral copies this assay can detect is 138 copies/mL. A negative result does not preclude SARS-Cov-2 infection and should not be used as the sole basis for treatment or other patient management decisions. A negative result may occur with  improper specimen collection/handling, submission of specimen other than nasopharyngeal swab, presence of viral mutation(s) within the areas targeted by this assay, and inadequate number of viral copies(<138 copies/mL). A negative result must be combined with clinical observations, patient history, and epidemiological information. The expected result is Negative.  Fact Sheet for Patients:  BloggerCourse.com  Fact Sheet for Healthcare Providers:  SeriousBroker.it  This test is no t yet approved or cleared by the [place]  FDA and  has been authorized for detection and/or diagnosis of SARS-CoV-2 by FDA under an Emergency Use Authorization (EUA). This EUA will remain  in effect (meaning this test can be used) for the duration of the COVID-19 declaration under Section 564(b)(1) of the Act, 21 U.S.C.section 360bbb-3(b)(1), unless the authorization is terminated  or revoked sooner.       Influenza A by PCR NEGATIVE NEGATIVE Final   Influenza B by PCR NEGATIVE NEGATIVE Final    Comment: (NOTE) The Xpert Xpress SARS-CoV-2/FLU/RSV plus assay is intended as an aid in the diagnosis of influenza from Nasopharyngeal swab specimens and should not be used as a sole basis for treatment. Nasal washings and aspirates are unacceptable for Xpert Xpress SARS-CoV-2/FLU/RSV testing.  Fact Sheet for  Patients: BloggerCourse.com  Fact Sheet for Healthcare Providers: SeriousBroker.it  This test is not yet approved or cleared by the [place]  FDA and has been authorized for detection and/or diagnosis of SARS-CoV-2 by FDA under an Emergency Use Authorization (EUA). This EUA will remain in effect (meaning this test can be used) for the duration of the COVID-19 declaration under Section 564(b)(1) of the Act, 21 U.S.C. section 360bbb-3(b)(1), unless the authorization is terminated or revoked.     Resp Syncytial Virus by PCR NEGATIVE NEGATIVE Final    Comment: (NOTE) Fact Sheet for Patients: BloggerCourse.com  Fact Sheet for Healthcare Providers: SeriousBroker.it  This test is not yet approved or cleared by the [place]  FDA and has been authorized for detection and/or diagnosis of SARS-CoV-2 by FDA under an Emergency Use Authorization (EUA). This EUA will remain in effect (meaning this test can be used) for the duration of the COVID-19 declaration under Section 564(b)(1) of the Act, 21 U.S.C. section 360bbb-3(b)(1), unless the authorization is terminated or revoked.  Performed at [facility], [address] [place], [place] [address]   Blood Culture (routine x 2)     Status: Abnormal   Collection Time: 03/27/24  9:04 AM   Specimen: BLOOD LEFT FOREARM  Result Value Ref Range Status   Specimen Description   Final    BLOOD LEFT FOREARM Performed at [facility], [address], [place], [place] [address]  Special Requests   Final    BOTTLES DRAWN AEROBIC AND ANAEROBIC Blood Culture adequate volume Performed at [facility], [address] [place], [place] [address]    Culture  Setup Time   Final    GRAM NEGATIVE RODS ANAEROBIC BOTTLE ONLY CRITICAL RESULT CALLED TO, READ BACK BY AND VERIFIED WITH: RN ALFRED [phone] [id number] FCP Performed at  [facility], [address] [place], [place] [address]    Culture KLEBSIELLA PNEUMONIAE (A)  Final   Report Status 03/30/2024 FINAL  Final   Organism ID, Bacteria KLEBSIELLA PNEUMONIAE  Final      Susceptibility   Klebsiella pneumoniae - MIC*    AMPICILLIN RESISTANT Resistant     CEFAZOLIN (NON-URINE) 2 SENSITIVE Sensitive     CEFEPIME  <=0.12 SENSITIVE Sensitive     ERTAPENEM <=0.12 SENSITIVE Sensitive     CEFTRIAXONE  <=0.25 SENSITIVE Sensitive     CIPROFLOXACIN  <=0.06 SENSITIVE Sensitive     GENTAMICIN <=1 SENSITIVE Sensitive     MEROPENEM <=0.25 SENSITIVE Sensitive     TRIMETH/SULFA <=20 SENSITIVE Sensitive     AMPICILLIN/SULBACTAM 4 SENSITIVE Sensitive     PIP/TAZO Value in next row Sensitive ug/mL     <=4 SENSITIVEThis is a modified FDA-approved test that has been validated and its performance characteristics determined by the reporting laboratory.  This laboratory is certified under the Clinical Laboratory Improvement [employer] CLIA as qualified to perform high complexity clinical laboratory testing.    * KLEBSIELLA PNEUMONIAE  Blood Culture ID Panel (Reflexed)     Status: Abnormal   Collection Time: 03/27/24  9:04 AM  Result Value Ref Range Status   Enterococcus faecalis NOT DETECTED NOT DETECTED Final   Enterococcus Faecium NOT DETECTED NOT DETECTED Final   Listeria monocytogenes NOT DETECTED NOT DETECTED Final   Staphylococcus species NOT DETECTED NOT DETECTED Final   Staphylococcus aureus (BCID) NOT DETECTED NOT DETECTED Final   Staphylococcus epidermidis NOT DETECTED NOT DETECTED Final   Staphylococcus lugdunensis NOT DETECTED NOT DETECTED Final   Streptococcus species NOT DETECTED NOT DETECTED Final   Streptococcus agalactiae NOT DETECTED NOT DETECTED Final   Streptococcus pneumoniae NOT DETECTED NOT DETECTED Final   Streptococcus pyogenes NOT DETECTED NOT DETECTED Final   A.calcoaceticus-baumannii NOT DETECTED NOT DETECTED Final   Bacteroides fragilis NOT  DETECTED NOT DETECTED Final   Enterobacterales DETECTED (A) NOT DETECTED Final    Comment: Enterobacterales represent a large order of gram negative bacteria, not a single organism. CRITICAL RESULT CALLED TO, READ BACK BY AND VERIFIED WITH: RN ALFRED [phone]    Enterobacter cloacae complex NOT DETECTED NOT DETECTED Final   Escherichia coli NOT DETECTED NOT DETECTED Final   Klebsiella aerogenes NOT DETECTED NOT DETECTED Final   Klebsiella oxytoca NOT DETECTED NOT DETECTED Final   Klebsiella pneumoniae DETECTED (A) NOT DETECTED Final    Comment: CRITICAL RESULT CALLED TO, READ BACK BY AND VERIFIED WITH: RN ALFRED [id number] FCP    Proteus species NOT DETECTED NOT DETECTED Final   Salmonella species NOT DETECTED NOT DETECTED Final   Serratia marcescens NOT DETECTED NOT DETECTED Final   Haemophilus influenzae NOT DETECTED NOT DETECTED Final   Neisseria meningitidis NOT DETECTED NOT DETECTED Final   Pseudomonas aeruginosa NOT DETECTED NOT DETECTED Final   Stenotrophomonas maltophilia NOT DETECTED NOT DETECTED Final   Candida albicans NOT DETECTED NOT DETECTED Final   Candida auris NOT DETECTED NOT DETECTED Final   Candida glabrata NOT DETECTED NOT DETECTED Final  Candida krusei NOT DETECTED NOT DETECTED Final   Candida parapsilosis NOT DETECTED NOT DETECTED Final   Candida tropicalis NOT DETECTED NOT DETECTED Final   Cryptococcus neoformans/gattii NOT DETECTED NOT DETECTED Final   CTX-M ESBL NOT DETECTED NOT DETECTED Final   Carbapenem resistance IMP NOT DETECTED NOT DETECTED Final   Carbapenem resistance KPC NOT DETECTED NOT DETECTED Final   Carbapenem resistance NDM NOT DETECTED NOT DETECTED Final   Carbapenem resist OXA 48 LIKE NOT DETECTED NOT DETECTED Final   Carbapenem resistance VIM NOT DETECTED NOT DETECTED Final    Comment: Performed at [facility], [address] [place], [place] [address]  Blood Culture (routine x 2)     Status: Abnormal   Collection  Time: 03/27/24  9:09 AM   Specimen: BLOOD RIGHT FOREARM  Result Value Ref Range Status   Specimen Description BLOOD RIGHT FOREARM  Final   Special Requests   Final    BOTTLES DRAWN AEROBIC AND ANAEROBIC Blood Culture adequate volume   Culture  Setup Time   Final    GRAM NEGATIVE RODS AEROBIC BOTTLE ONLY CRITICAL VALUE NOTED.  VALUE IS CONSISTENT WITH PREVIOUSLY REPORTED AND CALLED VALUE.    Culture (A)  Final    KLEBSIELLA PNEUMONIAE SUSCEPTIBILITIES PERFORMED ON PREVIOUS CULTURE WITHIN THE LAST 5 DAYS.    Report Status 03/30/2024 FINAL  Final  C Difficile Quick Screen w PCR reflex     Status: None   Collection Time: 03/28/24  7:48 AM   Specimen: STOOL  Result Value Ref Range Status   C Diff antigen NEGATIVE NEGATIVE Final   C Diff toxin NEGATIVE NEGATIVE Final   C Diff interpretation No C. difficile detected.  Final    Comment: Performed at [facility], [address] [place], [place] [address]  Gastrointestinal Panel by PCR , Stool     Status: None   Collection Time: 03/28/24  7:48 AM   Specimen: STOOL  Result Value Ref Range Status   Campylobacter species NOT DETECTED NOT DETECTED Final   Plesimonas shigelloides NOT DETECTED NOT DETECTED Final   Salmonella species NOT DETECTED NOT DETECTED Final   Yersinia enterocolitica NOT DETECTED NOT DETECTED Final   Vibrio species NOT DETECTED NOT DETECTED Final   Vibrio cholerae NOT DETECTED NOT DETECTED Final   Enteroaggregative E coli (EAEC) NOT DETECTED NOT DETECTED Final   Enteropathogenic E coli (EPEC) NOT DETECTED NOT DETECTED Final   Enterotoxigenic E coli (ETEC) NOT DETECTED NOT DETECTED Final   Shiga like toxin producing E coli (STEC) NOT DETECTED NOT DETECTED Final   Shigella/Enteroinvasive E coli (EIEC) NOT DETECTED NOT DETECTED Final   Cryptosporidium NOT DETECTED NOT DETECTED Final   Cyclospora cayetanensis NOT DETECTED NOT DETECTED Final   Entamoeba histolytica NOT DETECTED NOT DETECTED Final   Giardia lamblia  NOT DETECTED NOT DETECTED Final   Adenovirus F40/41 NOT DETECTED NOT DETECTED Final   Astrovirus NOT DETECTED NOT DETECTED Final   Norovirus GI/GII NOT DETECTED NOT DETECTED Final   Rotavirus A NOT DETECTED NOT DETECTED Final   Sapovirus (I, II, IV, and V) NOT DETECTED NOT DETECTED Final    Comment: Performed at [facility], [address] [place], [place] [address]  Culture, blood (Routine X 2) w Reflex to ID Panel     Status: None (Preliminary result)   Collection Time: 03/28/24  1:53 PM   Specimen: BLOOD LEFT ARM  Result Value Ref Range Status   Specimen Description BLOOD LEFT ARM  Final   Special Requests   Final    BOTTLES DRAWN AEROBIC AND ANAEROBIC Blood Culture adequate volume   Culture   Final    NO GROWTH 3 DAYS Performed at [facility], [address], [place], [place] [address]    Report Status PENDING  Incomplete  Culture, blood (Routine X 2) w Reflex to ID Panel     Status: None (Preliminary result)   Collection Time: 03/28/24  1:58 PM   Specimen: BLOOD LEFT ARM  Result Value Ref Range Status   Specimen Description BLOOD LEFT ARM  Final   Special Requests   Final    BOTTLES DRAWN AEROBIC AND ANAEROBIC Blood Culture adequate volume   Culture   Final    NO GROWTH 3 DAYS Performed at [facility], [address] [place], [place] [address]    Report Status PENDING  Incomplete     Labs: Basic Metabolic Panel: Recent Labs  Lab 03/27/24 0912 03/28/24 0748 03/29/24 0457 03/30/24 0508 03/31/24 0452  NA 139 138 141 145 142  K 3.7 4.1 3.9 4.4 3.7  CL 101 100 106 111 110  CO2 26 21* 24 24 22   GLUCOSE 107* 95 94 89 85  BUN 21* 27* 20 13 11   CREATININE 2.16* 2.15* 1.77* 1.60* 1.40*  CALCIUM 8.9 9.4 8.4* 8.3* 7.9*  MG  --   --   --  1.6* 2.4  PHOS  --   --   --   --  3.0   Liver Function Tests: Recent Labs  Lab 03/27/24 0912 03/28/24 0748 03/30/24 0508 03/31/24 0452  AST 34 28 21  --   ALT 19 18 13   --   ALKPHOS 68 88 62  --    BILITOT 0.5 0.9 0.3  --   PROT 6.7 6.5 5.3*  --   ALBUMIN 4.0 4.0 3.2* 3.1*   No results for input(s): LIPASE, AMYLASE in the last 168 hours. No results for input(s): AMMONIA in the last 168 hours. CBC: Recent Labs  Lab 03/27/24 0912 03/28/24 0748 03/29/24 0457 03/30/24 0508 03/31/24 0452  WBC 7.2 6.8 5.0 3.3* 3.2*  NEUTROABS 5.9 5.9  --  2.1 1.8  HGB 11.8* 11.6* 10.3* 9.7* 9.6*  HCT 35.6* 35.0* 32.3* 31.4* 31.3*  MCV 83.0 83.9 85.9 86.7 87.7  PLT 176 126* 141* 123* 133*   Cardiac Enzymes: No results for input(s): CKTOTAL, CKMB, CKMBINDEX, TROPONINI in the last 168 hours. BNP: BNP (last 3 results) No results for input(s): BNP in the last 8760 hours.  ProBNP (last 3 results) No results for input(s): PROBNP in the last 8760 hours.  CBG: No results for input(s): GLUCAP in the last 168 hours.     Signed:  Toribio Hummer MD.  [facility] 03/31/2024, 3:25 PM

## 2024-03-31 NOTE — Progress Notes (Signed)
 Subjective: No new complaints   Antibiotics:  Anti-infectives (From admission, onward)    Start     Dose/Rate Route Frequency Ordered Stop   03/30/24 0700  cefTRIAXone  (ROCEPHIN ) 2 g in sodium chloride  0.9 % 100 mL IVPB        2 g 200 mL/hr over 30 Minutes Intravenous Every 24 hours 03/29/24 0952     03/29/24 1000  hydroxychloroquine  (PLAQUENIL ) tablet 200 mg        200 mg Oral Daily 03/28/24 1310     03/28/24 0900  ceFEPIme  (MAXIPIME ) 2 g in sodium chloride  0.9 % 100 mL IVPB  Status:  Discontinued        2 g 200 mL/hr over 30 Minutes Intravenous Every 24 hours 03/28/24 0834 03/29/24 0952       Medications: Scheduled Meds:  DULoxetine   30 mg Oral Daily   enalapril   2.5 mg Oral Daily   gabapentin   300 mg Oral BID   heparin   5,000 Units Subcutaneous Q8H   hydroxychloroquine   200 mg Oral Daily   loratadine   10 mg Oral Daily   multivitamin with minerals  1 tablet Oral Once per day on Monday Wednesday Friday   saccharomyces boulardii  250 mg Oral BID   Continuous Infusions:  cefTRIAXone  (ROCEPHIN )  IV 2 g (03/31/24 0611)   PRN Meds:.acetaminophen  **OR** acetaminophen , albuterol , clonazePAM , eletriptan , loperamide , ondansetron  **OR** ondansetron  (ZOFRAN ) IV, traZODone     Objective: Weight change:   Intake/Output Summary (Last 24 hours) at 03/31/2024 1442 Last data filed at 03/30/2024 1604 Gross per 24 hour  Intake 269.23 ml  Output --  Net 269.23 ml   Blood pressure 132/71, pulse 86, temperature 98.3 F (36.8 C), temperature source Oral, resp. rate 17, height 5' (1.524 m), weight 55.3 kg, SpO2 100%. Temp:  [98.3 F (36.8 C)-98.7 F (37.1 C)] 98.3 F (36.8 C) (08/31 1154) Pulse Rate:  [74-91] 86 (08/31 1154) Resp:  [16-18] 17 (08/31 1154) BP: (123-141)/(66-79) 132/71 (08/31 1154) SpO2:  [97 %-100 %] 100 % (08/31 1154)  Physical Exam: Physical Exam Constitutional:      General: She is not in acute distress.    Appearance: She is well-developed. She  is not diaphoretic.  HENT:     Head: Normocephalic and atraumatic.     Right Ear: External ear normal.     Left Ear: External ear normal.     Mouth/Throat:     Pharynx: No oropharyngeal exudate.  Eyes:     General: No scleral icterus.    Conjunctiva/sclera: Conjunctivae normal.     Pupils: Pupils are equal, round, and reactive to light.  Cardiovascular:     Rate and Rhythm: Normal rate and regular rhythm.  Pulmonary:     Effort: Pulmonary effort is normal. No respiratory distress.  Abdominal:     General: There is no distension.     Palpations: Abdomen is soft.  Musculoskeletal:        General: No tenderness. Normal range of motion.  Lymphadenopathy:     Cervical: No cervical adenopathy.  Skin:    General: Skin is warm and dry.     Coloration: Skin is not pale.     Findings: No erythema or rash.  Neurological:     General: No focal deficit present.     Mental Status: She is alert and oriented to person, place, and time.     Motor: No abnormal muscle tone.     Coordination: Coordination normal.  Psychiatric:        Mood and Affect: Mood normal.        Behavior: Behavior normal.        Thought Content: Thought content normal.        Judgment: Judgment normal.      CBC:    BMET Recent Labs    03/30/24 0508 03/31/24 0452  NA 145 142  K 4.4 3.7  CL 111 110  CO2 24 22  GLUCOSE 89 85  BUN 13 11  CREATININE 1.60* 1.40*  CALCIUM 8.3* 7.9*     Liver Panel  Recent Labs    03/30/24 0508 03/31/24 0452  PROT 5.3*  --   ALBUMIN 3.2* 3.1*  AST 21  --   ALT 13  --   ALKPHOS 62  --   BILITOT 0.3  --        Sedimentation Rate No results for input(s): ESRSEDRATE in the last 72 hours. C-Reactive Protein No results for input(s): CRP in the last 72 hours.  Micro Results: Recent Results (from the past 720 hours)  Resp panel by RT-PCR (RSV, Flu A&B, Covid) Anterior Nasal Swab     Status: None   Collection Time: 03/27/24  8:46 AM   Specimen: Anterior  Nasal Swab  Result Value Ref Range Status   SARS Coronavirus 2 by RT PCR NEGATIVE NEGATIVE Final    Comment: (NOTE) SARS-CoV-2 target nucleic acids are NOT DETECTED.  The SARS-CoV-2 RNA is generally detectable in upper respiratory specimens during the acute phase of infection. The lowest concentration of SARS-CoV-2 viral copies this assay can detect is 138 copies/mL. A negative result does not preclude SARS-Cov-2 infection and should not be used as the sole basis for treatment or other patient management decisions. A negative result may occur with  improper specimen collection/handling, submission of specimen other than nasopharyngeal swab, presence of viral mutation(s) within the areas targeted by this assay, and inadequate number of viral copies(<138 copies/mL). A negative result must be combined with clinical observations, patient history, and epidemiological information. The expected result is Negative.  Fact Sheet for Patients:  BloggerCourse.com  Fact Sheet for Healthcare Providers:  SeriousBroker.it  This test is no t yet approved or cleared by the [place]  FDA and  has been authorized for detection and/or diagnosis of SARS-CoV-2 by FDA under an Emergency Use Authorization (EUA). This EUA will remain  in effect (meaning this test can be used) for the duration of the COVID-19 declaration under Section 564(b)(1) of the Act, 21 U.S.C.section 360bbb-3(b)(1), unless the authorization is terminated  or revoked sooner.       Influenza A by PCR NEGATIVE NEGATIVE Final   Influenza B by PCR NEGATIVE NEGATIVE Final    Comment: (NOTE) The Xpert Xpress SARS-CoV-2/FLU/RSV plus assay is intended as an aid in the diagnosis of influenza from Nasopharyngeal swab specimens and should not be used as a sole basis for treatment. Nasal washings and aspirates are unacceptable for Xpert Xpress SARS-CoV-2/FLU/RSV testing.  Fact Sheet for  Patients: BloggerCourse.com  Fact Sheet for Healthcare Providers: SeriousBroker.it  This test is not yet approved or cleared by the [place]  FDA and has been authorized for detection and/or diagnosis of SARS-CoV-2 by FDA under an Emergency Use Authorization (EUA). This EUA will remain in effect (meaning this test can be used) for the duration of the COVID-19 declaration under Section 564(b)(1) of the Act, 21 U.S.C. section 360bbb-3(b)(1), unless the authorization is terminated or revoked.     Resp  Syncytial Virus by PCR NEGATIVE NEGATIVE Final    Comment: (NOTE) Fact Sheet for Patients: BloggerCourse.com  Fact Sheet for Healthcare Providers: SeriousBroker.it  This test is not yet approved or cleared by the [place]  FDA and has been authorized for detection and/or diagnosis of SARS-CoV-2 by FDA under an Emergency Use Authorization (EUA). This EUA will remain in effect (meaning this test can be used) for the duration of the COVID-19 declaration under Section 564(b)(1) of the Act, 21 U.S.C. section 360bbb-3(b)(1), unless the authorization is terminated or revoked.  Performed at [facility], [address] [place], [place] [address]   Blood Culture (routine x 2)     Status: Abnormal   Collection Time: 03/27/24  9:04 AM   Specimen: BLOOD LEFT FOREARM  Result Value Ref Range Status   Specimen Description   Final    BLOOD LEFT FOREARM Performed at [facility], [address] [place], [place] [address]    Special Requests   Final    BOTTLES DRAWN AEROBIC AND ANAEROBIC Blood Culture adequate volume Performed at [facility], [address] [place], [place] [address]    Culture  Setup Time   Final    GRAM NEGATIVE RODS ANAEROBIC BOTTLE ONLY CRITICAL RESULT CALLED TO, READ BACK BY AND VERIFIED WITH: RN ALFRED [phone] FCP Performed at  [facility], [address], [place], [place] [address]    Culture KLEBSIELLA PNEUMONIAE (A)  Final   Report Status 03/30/2024 FINAL  Final   Organism ID, Bacteria KLEBSIELLA PNEUMONIAE  Final      Susceptibility   Klebsiella pneumoniae - MIC*    AMPICILLIN RESISTANT Resistant     CEFAZOLIN (NON-URINE) 2 SENSITIVE Sensitive     CEFEPIME  <=0.12 SENSITIVE Sensitive     ERTAPENEM <=0.12 SENSITIVE Sensitive     CEFTRIAXONE  <=0.25 SENSITIVE Sensitive     CIPROFLOXACIN  <=0.06 SENSITIVE Sensitive     GENTAMICIN <=1 SENSITIVE Sensitive     MEROPENEM <=0.25 SENSITIVE Sensitive     TRIMETH/SULFA <=20 SENSITIVE Sensitive     AMPICILLIN/SULBACTAM 4 SENSITIVE Sensitive     PIP/TAZO Value in next row Sensitive ug/mL     <=4 SENSITIVEThis is a modified FDA-approved test that has been validated and its performance characteristics determined by the reporting laboratory.  This laboratory is certified under the Clinical Laboratory Improvement Amendments CLIA as qualified to perform high complexity clinical laboratory testing.    * KLEBSIELLA PNEUMONIAE  Blood Culture ID Panel (Reflexed)     Status: Abnormal   Collection Time: 03/27/24  9:04 AM  Result Value Ref Range Status   Enterococcus faecalis NOT DETECTED NOT DETECTED Final   Enterococcus Faecium NOT DETECTED NOT DETECTED Final   Listeria monocytogenes NOT DETECTED NOT DETECTED Final   Staphylococcus species NOT DETECTED NOT DETECTED Final   Staphylococcus aureus (BCID) NOT DETECTED NOT DETECTED Final   Staphylococcus epidermidis NOT DETECTED NOT DETECTED Final   Staphylococcus lugdunensis NOT DETECTED NOT DETECTED Final   Streptococcus species NOT DETECTED NOT DETECTED Final   Streptococcus agalactiae NOT DETECTED NOT DETECTED Final   Streptococcus pneumoniae NOT DETECTED NOT DETECTED Final   Streptococcus pyogenes NOT DETECTED NOT DETECTED Final   A.calcoaceticus-baumannii NOT DETECTED NOT DETECTED Final   Bacteroides fragilis NOT  DETECTED NOT DETECTED Final   Enterobacterales DETECTED (A) NOT DETECTED Final    Comment: Enterobacterales represent a large order of gram negative bacteria, not a single organism. CRITICAL RESULT CALLED TO,  READ BACK BY AND VERIFIED WITH: RN ALFRED [phone] FCP    Enterobacter cloacae complex NOT DETECTED NOT DETECTED Final   Escherichia coli NOT DETECTED NOT DETECTED Final   Klebsiella aerogenes NOT DETECTED NOT DETECTED Final   Klebsiella oxytoca NOT DETECTED NOT DETECTED Final   Klebsiella pneumoniae DETECTED (A) NOT DETECTED Final    Comment: CRITICAL RESULT CALLED TO, READ BACK BY AND VERIFIED WITH: RN ALFRED [phone] FCP    Proteus species NOT DETECTED NOT DETECTED Final   Salmonella species NOT DETECTED NOT DETECTED Final   Serratia marcescens NOT DETECTED NOT DETECTED Final   Haemophilus influenzae NOT DETECTED NOT DETECTED Final   Neisseria meningitidis NOT DETECTED NOT DETECTED Final   Pseudomonas aeruginosa NOT DETECTED NOT DETECTED Final   Stenotrophomonas maltophilia NOT DETECTED NOT DETECTED Final   Candida albicans NOT DETECTED NOT DETECTED Final   Candida auris NOT DETECTED NOT DETECTED Final   Candida glabrata NOT DETECTED NOT DETECTED Final   Candida krusei NOT DETECTED NOT DETECTED Final   Candida parapsilosis NOT DETECTED NOT DETECTED Final   Candida tropicalis NOT DETECTED NOT DETECTED Final   Cryptococcus neoformans/gattii NOT DETECTED NOT DETECTED Final   CTX-M ESBL NOT DETECTED NOT DETECTED Final   Carbapenem resistance IMP NOT DETECTED NOT DETECTED Final   Carbapenem resistance KPC NOT DETECTED NOT DETECTED Final   Carbapenem resistance NDM NOT DETECTED NOT DETECTED Final   Carbapenem resist OXA 48 LIKE NOT DETECTED NOT DETECTED Final   Carbapenem resistance VIM NOT DETECTED NOT DETECTED Final    Comment: Performed at [facility], [address], [place], [place] [address]  Blood Culture (routine x 2)     Status: Abnormal   Collection  Time: 03/27/24  9:09 AM   Specimen: BLOOD RIGHT FOREARM  Result Value Ref Range Status   Specimen Description BLOOD RIGHT FOREARM  Final   Special Requests   Final    BOTTLES DRAWN AEROBIC AND ANAEROBIC Blood Culture adequate volume   Culture  Setup Time   Final    GRAM NEGATIVE RODS AEROBIC BOTTLE ONLY CRITICAL VALUE NOTED.  VALUE IS CONSISTENT WITH PREVIOUSLY REPORTED AND CALLED VALUE.    Culture (A)  Final    KLEBSIELLA PNEUMONIAE SUSCEPTIBILITIES PERFORMED ON PREVIOUS CULTURE WITHIN THE LAST 5 DAYS.    Report Status 03/30/2024 FINAL  Final  C Difficile Quick Screen w PCR reflex     Status: None   Collection Time: 03/28/24  7:48 AM   Specimen: STOOL  Result Value Ref Range Status   C Diff antigen NEGATIVE NEGATIVE Final   C Diff toxin NEGATIVE NEGATIVE Final   C Diff interpretation No C. difficile detected.  Final    Comment: Performed at [facility], [address] [place], [place] [address]  Gastrointestinal Panel by PCR , Stool     Status: None   Collection Time: 03/28/24  7:48 AM   Specimen: STOOL  Result Value Ref Range Status   Campylobacter species NOT DETECTED NOT DETECTED Final   Plesimonas shigelloides NOT DETECTED NOT DETECTED Final   Salmonella species NOT DETECTED NOT DETECTED Final   Yersinia enterocolitica NOT DETECTED NOT DETECTED Final   Vibrio species NOT DETECTED NOT DETECTED Final   Vibrio cholerae NOT DETECTED NOT DETECTED Final   Enteroaggregative E coli (EAEC) NOT DETECTED NOT DETECTED Final   Enteropathogenic E coli (EPEC) NOT DETECTED NOT DETECTED Final   Enterotoxigenic E coli (ETEC) NOT DETECTED NOT DETECTED Final  Shiga like toxin producing E coli (STEC) NOT DETECTED NOT DETECTED Final   Shigella/Enteroinvasive E coli (EIEC) NOT DETECTED NOT DETECTED Final   Cryptosporidium NOT DETECTED NOT DETECTED Final   Cyclospora cayetanensis NOT DETECTED NOT DETECTED Final   Entamoeba histolytica NOT DETECTED NOT DETECTED Final   Giardia lamblia  NOT DETECTED NOT DETECTED Final   Adenovirus F40/41 NOT DETECTED NOT DETECTED Final   Astrovirus NOT DETECTED NOT DETECTED Final   Norovirus GI/GII NOT DETECTED NOT DETECTED Final   Rotavirus A NOT DETECTED NOT DETECTED Final   Sapovirus (I, II, IV, and V) NOT DETECTED NOT DETECTED Final    Comment: Performed at [facility], [address] [place], [place] [address]  Culture, blood (Routine X 2) w Reflex to ID Panel     Status: None (Preliminary result)   Collection Time: 03/28/24  1:53 PM   Specimen: BLOOD LEFT ARM  Result Value Ref Range Status   Specimen Description BLOOD LEFT ARM  Final   Special Requests   Final    BOTTLES DRAWN AEROBIC AND ANAEROBIC Blood Culture adequate volume   Culture   Final    NO GROWTH 3 DAYS Performed at [facility], [address], [place], [place] [address]    Report Status PENDING  Incomplete  Culture, blood (Routine X 2) w Reflex to ID Panel     Status: None (Preliminary result)   Collection Time: 03/28/24  1:58 PM   Specimen: BLOOD LEFT ARM  Result Value Ref Range Status   Specimen Description BLOOD LEFT ARM  Final   Special Requests   Final    BOTTLES DRAWN AEROBIC AND ANAEROBIC Blood Culture adequate volume   Culture   Final    NO GROWTH 3 DAYS Performed at [facility], [address] [place], [place] [address]    Report Status PENDING  Incomplete    Studies/Results: MR ABDOMEN MRCP W WO CONTAST Result Date: 03/30/2024 CLINICAL DATA:  58 year old female inpatient with abdominal pain and Klebsiella pneumoniae bacteremia. EXAM: MRI ABDOMEN WITHOUT AND WITH CONTRAST (INCLUDING MRCP) TECHNIQUE: Multiplanar multisequence MR imaging of the abdomen was performed both before and after the administration of intravenous contrast. Heavily T2-weighted images of the biliary and pancreatic ducts were obtained, and three-dimensional MRCP images were rendered by post processing. CONTRAST:  6mL GADAVIST  GADOBUTROL  1 MMOL/ML IV SOLN  COMPARISON:  04/09/2021 MRI abdomen.  03/28/2024 CT abdomen/pelvis. FINDINGS: Lower chest: Trace layering bilateral pleural effusions. Bilateral breast prostheses partially visualized. Hepatobiliary: Liver is mildly enlarged by innumerable liver cysts scattered throughout the liver, including simple and mildly complex liver cysts, a few of which demonstrate internal hemorrhagic/proteinaceous signal, and none of which demonstrate enhancement or wall thickening. Representative 1.6 x 1.3 cm hemorrhagic/proteinaceous segment 3 left liver cyst on series 12/image 39. Representative dominant 4.8 x 4.2 cm segment 7 right liver cyst with minimal layering hemorrhagic/proteinaceous material on series 4/image 7. no suspicious liver masses. No hepatic steatosis. Normal gallbladder with no cholelithiasis. Chronic dilatation of the common bile duct with common bile duct diameter 11 mm. Chronic mild diffuse intrahepatic biliary ductal dilatation. The intrahepatic and extrahepatic biliary ductal dilatation has slightly worsened since 04/08/2021 MRI, as best seen in the right liver on coronal series 3/image 10, with previous common bile duct diameter 10 mm. No choledocholithiasis. No biliary mass, wall thickening or hyperenhancement. No discrete biliary strictures. Pancreas: No pancreatic mass or duct dilation.  No pancreas divisum. Spleen: Normal size. No mass. Adrenals/Urinary Tract:  Normal adrenals. No hydronephrosis. 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 No suspicious renal masses identified. Representative 1.5 cm posterior interpolar right kidney (series 12/image 55) and 1.4 cm anterior lower left kidney (series 12/image 67) T1 hyperintense renal lesions without appreciable enhancement compatible with Bosniak category 2 hemorrhagic/proteinaceous renal cysts. Representative dominant 6.6 cm simple inferior right  renal cyst. Stomach/Bowel: Normal non-distended stomach. Visualized small and large bowel is normal caliber, with no bowel wall thickening. Vascular/Lymphatic: Normal caliber abdominal aorta. Patent portal, splenic, hepatic and renal veins. No pathologically enlarged lymph nodes in the abdomen. Other: No abdominal ascites or focal fluid collection. Musculoskeletal: No aggressive appearing focal osseous lesions. IMPRESSION: 1. No acute abnormality. 2. Chronic mild intrahepatic and extrahepatic biliary ductal dilatation, slightly worsened since 04/08/2021 MRI. No choledocholithiasis. No obstructing mass. No discrete biliary strictures. 3. Polycystic kidney and liver disease. No suspicious masses. 4. Trace layering bilateral pleural effusions. Electronically Signed   By: Selinda DELENA Blue M.D.   On: 03/30/2024 12:56   MR 3D Recon At Scanner Result Date: 03/30/2024 CLINICAL DATA:  58 year old female inpatient with abdominal pain and Klebsiella pneumoniae bacteremia. EXAM: MRI ABDOMEN WITHOUT AND WITH CONTRAST (INCLUDING MRCP) TECHNIQUE: Multiplanar multisequence MR imaging of the abdomen was performed both before and after the administration of intravenous contrast. Heavily T2-weighted images of the biliary and pancreatic ducts were obtained, and three-dimensional MRCP images were rendered by post processing. CONTRAST:  6mL GADAVIST  GADOBUTROL  1 MMOL/ML IV SOLN COMPARISON:  04/09/2021 MRI abdomen.  03/28/2024 CT abdomen/pelvis. FINDINGS: Lower chest: Trace layering bilateral pleural effusions. Bilateral breast prostheses partially visualized. Hepatobiliary: Liver is mildly enlarged by innumerable liver cysts scattered throughout the liver, including simple and mildly complex liver cysts, a few of which demonstrate internal hemorrhagic/proteinaceous signal, and none of which demonstrate enhancement or wall thickening. Representative 1.6 x 1.3 cm hemorrhagic/proteinaceous segment 3 left liver cyst on series 12/image 39.  Representative dominant 4.8 x 4.2 cm segment 7 right liver cyst with minimal layering hemorrhagic/proteinaceous material on series 4/image 7. no suspicious liver masses. No hepatic steatosis. Normal gallbladder with no cholelithiasis. Chronic dilatation of the common bile duct with common bile duct diameter 11 mm. Chronic mild diffuse intrahepatic biliary ductal dilatation. The intrahepatic and extrahepatic biliary ductal dilatation has slightly worsened since 04/08/2021 MRI, as best seen in the right liver on coronal series 3/image 10, with previous common bile duct diameter 10 mm. No choledocholithiasis. No biliary mass, wall thickening or hyperenhancement. No discrete biliary strictures. Pancreas: No pancreatic mass or duct dilation.  No pancreas divisum. Spleen: Normal size. No mass. Adrenals/Urinary Tract: Normal adrenals. No hydronephrosis. 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 No suspicious renal masses identified. Representative 1.5 cm posterior interpolar right kidney (series 12/image 55) and 1.4 cm anterior lower left kidney (series 12/image 67) T1 hyperintense renal lesions without appreciable enhancement compatible with Bosniak category 2 hemorrhagic/proteinaceous renal cysts. Representative dominant 6.6 cm simple inferior right renal cyst. Stomach/Bowel: Normal non-distended stomach. Visualized small and large bowel is normal caliber, with no bowel wall thickening. Vascular/Lymphatic: Normal caliber abdominal aorta. Patent portal, splenic, hepatic and renal veins. No pathologically enlarged lymph nodes in the abdomen. Other: No abdominal ascites or focal fluid collection. Musculoskeletal: No aggressive appearing focal osseous lesions. IMPRESSION: 1. No acute abnormality. 2. Chronic mild intrahepatic and extrahepatic biliary ductal dilatation, slightly worsened since 04/08/2021 MRI. No  choledocholithiasis. No obstructing mass. No discrete biliary strictures. 3. Polycystic kidney and liver disease. No suspicious masses. 4. Trace layering bilateral pleural effusions. Electronically Signed   By: Selinda DELENA Blue M.D.   On: 03/30/2024 12:56      Assessment/Plan:  INTERVAL HISTORY:  MRI resulted ---see yesterday's note   Principal Problem:   Bacteremia due to Klebsiella pneumoniae Active Problems:   Polycystic kidney disease   Seropositive rheumatoid arthritis (HCC)   AKI (acute kidney injury) (HCC)   Anemia    Judy Manning is a 58 y.o. female with past medical history significant for polycystic kidney and liver disease who I saw previously in 2022 when she had an infected liver cyst with Klebsiella which I treated with protracted oral antibiotics.  She has now been admitted with Klebsiella pneumonia bacteremia but without a clear source.  She has had a CT of the abdomen pelvis and now an MRI which do not show evidence of an acute infection in the liver.  That being said she feels symptoms that were nearly identical to what she experienced several years ago with right sided shoulder pain and abdominal pain along with her fevers.  Even though we have not seen a discrete infection of any of her liver cysts she would prefer to err on the side of caution and treat with the presumption that one of them might be infected.  Will therefore have her discharged with 1 month of cefadroxil  with current renal function she can take 2 tablets twice daily.  I will endeavor to obtain a PET scan as an outpatient so that we can see if there is any areas of hypermetabolism within the liver that would correspond to an infection.  Hopefully this be approved by insurance.  She is scheduled for follow-up with me in mid September  I have personally spent 50 minutes involved in face-to-face and non-face-to-face activities for this patient on the day of the visit. Professional time spent  includes the following activities: Preparing to see the patient (review of tests), Obtaining and/or reviewing separately obtained history (admission/discharge record), Performing a medically appropriate examination and/or evaluation , Ordering medications/tests/procedures, referring and communicating with other health care professionals, Documenting clinical information in the EMR, Independently interpreting results (not separately reported), Communicating results to the patient/family/caregiver, Counseling and educating the patient/family/caregiver and Care coordination (not separately reported).   Evaluation of the patient requires complex antimicrobial therapy evaluation, counseling , isolation needs to reduce disease transmission and risk assessment and mitigation.      I have personally spent 52 minutes involved in face-to-face and non-face-to-face activities for this patient on the day of the visit. Professional time spent includes the following activities: Preparing to see the patient (review of tests), Obtaining and/or reviewing separately obtained history (admission/discharge record), Performing a medically appropriate examination and/or evaluation , Ordering medications/tests/procedures, referring and communicating with other health care professionals, Documenting clinical information in the EMR, Independently interpreting results (not separately reported), Communicating results to the patient/family/caregiver, Counseling and educating the patient/family/caregiver and Care coordination (not separately reported).   Evaluation of the patient requires complex antimicrobial therapy evaluation, counseling , isolation needs to reduce disease transmission and risk assessment and mitigation.      LOS: 3 days   Judy Manning 03/31/2024, 2:42 PM

## 2024-03-31 NOTE — Progress Notes (Signed)
 Subjective: She reports feeling well.  Denies abdominal pain.  Has been tolerating regular diet.  Has been ambulating without assistance.  Objective: Vital signs in last 24 hours: Temp:  [98.2 F (36.8 C)-98.7 F (37.1 C)] 98.6 F (37 C) (08/31 0550) Pulse Rate:  [74-91] 74 (08/31 0550) Resp:  [16-19] 16 (08/31 0550) BP: (123-157)/(66-89) 123/66 (08/31 0934) SpO2:  [97 %-100 %] 97 % (08/31 0550) Weight change:  Last BM Date : 03/28/24  PE: Not in distress GENERAL: Nonicteric, no pallor  ABDOMEN: Soft, nondistended, nontender, normoactive bowel sounds EXTREMITIES: No deformity  Lab Results: Results for orders placed or performed during the hospital encounter of 03/28/24 (from the past 48 hours)  Comprehensive metabolic panel     Status: Abnormal   Collection Time: 03/30/24  5:08 AM  Result Value Ref Range   Sodium 145 135 - 145 mmol/L   Potassium 4.4 3.5 - 5.1 mmol/L   Chloride 111 98 - 111 mmol/L   CO2 24 22 - 32 mmol/L   Glucose, Bld 89 70 - 99 mg/dL    Comment: Glucose reference range applies only to samples taken after fasting for at least 8 hours.   BUN 13 6 - 20 mg/dL   Creatinine, Ser 8.39 (H) 0.44 - 1.00 mg/dL   Calcium 8.3 (L) 8.9 - 10.3 mg/dL   Total Protein 5.3 (L) 6.5 - 8.1 g/dL   Albumin 3.2 (L) 3.5 - 5.0 g/dL   AST 21 15 - 41 U/L   ALT 13 0 - 44 U/L   Alkaline Phosphatase 62 38 - 126 U/L   Total Bilirubin 0.3 0.0 - 1.2 mg/dL   GFR, Estimated 38 (L) >60 mL/min    Comment: (NOTE) Calculated using the CKD-EPI Creatinine Equation (2021)    Anion gap 11 5 - 15    Comment: Performed at [facility], [address], [place], [place] [address]  CBC with Differential/Platelet     Status: Abnormal   Collection Time: 03/30/24  5:08 AM  Result Value Ref Range   WBC 3.3 (L) 4.0 - 10.5 K/uL   RBC 3.62 (L) 3.87 - 5.11 MIL/uL   Hemoglobin 9.7 (L) 12.0 - 15.0 g/dL   HCT 68.5 (L) 63.9 - 53.9 %   MCV 86.7 80.0 - 100.0 fL   MCH 26.8 26.0 - 34.0 pg    MCHC 30.9 30.0 - 36.0 g/dL   RDW 85.9 88.4 - 84.4 %   Platelets 123 (L) 150 - 400 K/uL    Comment: REPEATED TO VERIFY   nRBC 0.0 0.0 - 0.2 %   Neutrophils Relative % 62 %   Neutro Abs 2.1 1.7 - 7.7 K/uL   Lymphocytes Relative 23 %   Lymphs Abs 0.7 0.7 - 4.0 K/uL   Monocytes Relative 10 %   Monocytes Absolute 0.3 0.1 - 1.0 K/uL   Eosinophils Relative 4 %   Eosinophils Absolute 0.1 0.0 - 0.5 K/uL   Basophils Relative 1 %   Basophils Absolute 0.0 0.0 - 0.1 K/uL   Immature Granulocytes 0 %   Abs Immature Granulocytes 0.01 0.00 - 0.07 K/uL    Comment: Performed at [facility], [address], [place], [place] [address]  Magnesium      Status: Abnormal   Collection Time: 03/30/24  5:08 AM  Result Value Ref Range   Magnesium  1.6 (L) 1.7 - 2.4 mg/dL    Comment: Performed at [facility], [address], [place], [place] [address]  Vitamin B12     Status: Abnormal   Collection Time: 03/30/24  5:08 AM  Result Value Ref Range   Vitamin B-12 954 (H) 180 - 914 pg/mL    Comment: Performed at [facility], [address] [place], [place] [address]  Folate     Status: None   Collection Time: 03/30/24  5:08 AM  Result Value Ref Range   Folate >20.0 >5.9 ng/mL    Comment: Performed at [facility], [address], [place], [place] [address]  Iron and TIBC     Status: Abnormal   Collection Time: 03/30/24  5:08 AM  Result Value Ref Range   Iron 30 28 - 170 ug/dL   TIBC 772 (L) 749 - 549 ug/dL   Saturation Ratios 13 10.4 - 31.8 %   UIBC 197 ug/dL    Comment: Performed at [facility], [address], [place], [place] [address]  Ferritin     Status: None   Collection Time: 03/30/24  5:08 AM  Result Value Ref Range   Ferritin 80 11 - 307 ng/mL    Comment: Performed at [facility], [address], [place], [place] [address]  Magnesium      Status: None   Collection Time: 03/31/24  4:52 AM   Result Value Ref Range   Magnesium  2.4 1.7 - 2.4 mg/dL    Comment: Performed at [facility], [address] [place], [place] [address]  CBC with Differential/Platelet     Status: Abnormal   Collection Time: 03/31/24  4:52 AM  Result Value Ref Range   WBC 3.2 (L) 4.0 - 10.5 K/uL   RBC 3.57 (L) 3.87 - 5.11 MIL/uL   Hemoglobin 9.6 (L) 12.0 - 15.0 g/dL   HCT 68.6 (L) 63.9 - 53.9 %   MCV 87.7 80.0 - 100.0 fL   MCH 26.9 26.0 - 34.0 pg   MCHC 30.7 30.0 - 36.0 g/dL   RDW 86.0 88.4 - 84.4 %   Platelets 133 (L) 150 - 400 K/uL   nRBC 0.0 0.0 - 0.2 %   Neutrophils Relative % 57 %   Neutro Abs 1.8 1.7 - 7.7 K/uL   Lymphocytes Relative 27 %   Lymphs Abs 0.9 0.7 - 4.0 K/uL   Monocytes Relative 10 %   Monocytes Absolute 0.3 0.1 - 1.0 K/uL   Eosinophils Relative 5 %   Eosinophils Absolute 0.2 0.0 - 0.5 K/uL   Basophils Relative 1 %   Basophils Absolute 0.0 0.0 - 0.1 K/uL   Immature Granulocytes 0 %   Abs Immature Granulocytes 0.00 0.00 - 0.07 K/uL    Comment: Performed at [facility], [address], [place], [place] [address]  Renal function panel     Status: Abnormal   Collection Time: 03/31/24  4:52 AM  Result Value Ref Range   Sodium 142 135 - 145 mmol/L   Potassium 3.7 3.5 - 5.1 mmol/L   Chloride 110 98 - 111 mmol/L   CO2 22 22 - 32 mmol/L   Glucose, Bld 85 70 - 99 mg/dL    Comment: Glucose reference range applies only to samples taken after fasting for at least 8 hours.   BUN 11 6 - 20 mg/dL   Creatinine, Ser 8.59 (H) 0.44 - 1.00 mg/dL   Calcium 7.9 (L) 8.9 - 10.3 mg/dL   Phosphorus 3.0 2.5 - 4.6 mg/dL   Albumin 3.1 (L) 3.5 - 5.0 g/dL  GFR, Estimated 44 (L) >60 mL/min    Comment: (NOTE) Calculated using the CKD-EPI Creatinine Equation (2021)    Anion gap 11 5 - 15    Comment: Performed at [facility], [address], [place], [place] [address]    Studies/Results: MR ABDOMEN MRCP W WO CONTAST Result Date:  03/30/2024 CLINICAL DATA:  58 year old female inpatient with abdominal pain and Klebsiella pneumoniae bacteremia. EXAM: MRI ABDOMEN WITHOUT AND WITH CONTRAST (INCLUDING MRCP) TECHNIQUE: Multiplanar multisequence MR imaging of the abdomen was performed both before and after the administration of intravenous contrast. Heavily T2-weighted images of the biliary and pancreatic ducts were obtained, and three-dimensional MRCP images were rendered by post processing. CONTRAST:  6mL GADAVIST  GADOBUTROL  1 MMOL/ML IV SOLN COMPARISON:  04/09/2021 MRI abdomen.  03/28/2024 CT abdomen/pelvis. FINDINGS: Lower chest: Trace layering bilateral pleural effusions. Bilateral breast prostheses partially visualized. Hepatobiliary: Liver is mildly enlarged by innumerable liver cysts scattered throughout the liver, including simple and mildly complex liver cysts, a few of which demonstrate internal hemorrhagic/proteinaceous signal, and none of which demonstrate enhancement or wall thickening. Representative 1.6 x 1.3 cm hemorrhagic/proteinaceous segment 3 left liver cyst on series 12/image 39. Representative dominant 4.8 x 4.2 cm segment 7 right liver cyst with minimal layering hemorrhagic/proteinaceous material on series 4/image 7. no suspicious liver masses. No hepatic steatosis. Normal gallbladder with no cholelithiasis. Chronic dilatation of the common bile duct with common bile duct diameter 11 mm. Chronic mild diffuse intrahepatic biliary ductal dilatation. The intrahepatic and extrahepatic biliary ductal dilatation has slightly worsened since 04/08/2021 MRI, as best seen in the right liver on coronal series 3/image 10, with previous common bile duct diameter 10 mm. No choledocholithiasis. No biliary mass, wall thickening or hyperenhancement. No discrete biliary strictures. Pancreas: No pancreatic mass or duct dilation.  No pancreas divisum. Spleen: Normal size. No mass. Adrenals/Urinary Tract: Normal adrenals. No hydronephrosis.  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 No suspicious renal masses identified. Representative 1.5 cm posterior interpolar right kidney (series 12/image 55) and 1.4 cm anterior lower left kidney (series 12/image 67) T1 hyperintense renal lesions without appreciable enhancement compatible with Bosniak category 2 hemorrhagic/proteinaceous renal cysts. Representative dominant 6.6 cm simple inferior right renal cyst. Stomach/Bowel: Normal non-distended stomach. Visualized small and large bowel is normal caliber, with no bowel wall thickening. Vascular/Lymphatic: Normal caliber abdominal aorta. Patent portal, splenic, hepatic and renal veins. No pathologically enlarged lymph nodes in the abdomen. Other: No abdominal ascites or focal fluid collection. Musculoskeletal: No aggressive appearing focal osseous lesions. IMPRESSION: 1. No acute abnormality. 2. Chronic mild intrahepatic and extrahepatic biliary ductal dilatation, slightly worsened since 04/08/2021 MRI. No choledocholithiasis. No obstructing mass. No discrete biliary strictures. 3. Polycystic kidney and liver disease. No suspicious masses. 4. Trace layering bilateral pleural effusions. Electronically Signed   By: Selinda DELENA Blue M.D.   On: 03/30/2024 12:56   MR 3D Recon At Scanner Result Date: 03/30/2024 CLINICAL DATA:  58 year old female inpatient with abdominal pain and Klebsiella pneumoniae bacteremia. EXAM: MRI ABDOMEN WITHOUT AND WITH CONTRAST (INCLUDING MRCP) TECHNIQUE: Multiplanar multisequence MR imaging of the abdomen was performed both before and after the administration of intravenous contrast. Heavily T2-weighted images of the biliary and pancreatic ducts were obtained, and three-dimensional MRCP images were rendered by post processing. CONTRAST:  6mL GADAVIST  GADOBUTROL  1 MMOL/ML IV SOLN COMPARISON:  04/09/2021 MRI abdomen.  03/28/2024  CT abdomen/pelvis. FINDINGS: Lower chest: Trace layering bilateral pleural effusions.  Bilateral breast prostheses partially visualized. Hepatobiliary: Liver is mildly enlarged by innumerable liver cysts scattered throughout the liver, including simple and mildly complex liver cysts, a few of which demonstrate internal hemorrhagic/proteinaceous signal, and none of which demonstrate enhancement or wall thickening. Representative 1.6 x 1.3 cm hemorrhagic/proteinaceous segment 3 left liver cyst on series 12/image 39. Representative dominant 4.8 x 4.2 cm segment 7 right liver cyst with minimal layering hemorrhagic/proteinaceous material on series 4/image 7. no suspicious liver masses. No hepatic steatosis. Normal gallbladder with no cholelithiasis. Chronic dilatation of the common bile duct with common bile duct diameter 11 mm. Chronic mild diffuse intrahepatic biliary ductal dilatation. The intrahepatic and extrahepatic biliary ductal dilatation has slightly worsened since 04/08/2021 MRI, as best seen in the right liver on coronal series 3/image 10, with previous common bile duct diameter 10 mm. No choledocholithiasis. No biliary mass, wall thickening or hyperenhancement. No discrete biliary strictures. Pancreas: No pancreatic mass or duct dilation.  No pancreas divisum. Spleen: Normal size. No mass. Adrenals/Urinary Tract: Normal adrenals. No hydronephrosis. 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 No suspicious renal masses identified. Representative 1.5 cm posterior interpolar right kidney (series 12/image 55) and 1.4 cm anterior lower left kidney (series 12/image 67) T1 hyperintense renal lesions without appreciable enhancement compatible with Bosniak category 2 hemorrhagic/proteinaceous renal cysts. Representative dominant 6.6 cm simple inferior right renal cyst. Stomach/Bowel: Normal non-distended  stomach. Visualized small and large bowel is normal caliber, with no bowel wall thickening. Vascular/Lymphatic: Normal caliber abdominal aorta. Patent portal, splenic, hepatic and renal veins. No pathologically enlarged lymph nodes in the abdomen. Other: No abdominal ascites or focal fluid collection. Musculoskeletal: No aggressive appearing focal osseous lesions. IMPRESSION: 1. No acute abnormality. 2. Chronic mild intrahepatic and extrahepatic biliary ductal dilatation, slightly worsened since 04/08/2021 MRI. No choledocholithiasis. No obstructing mass. No discrete biliary strictures. 3. Polycystic kidney and liver disease. No suspicious masses. 4. Trace layering bilateral pleural effusions. Electronically Signed   By: Selinda DELENA Blue M.D.   On: 03/30/2024 12:56    Medications: I have reviewed the patient's current medications.  Assessment: Klebsiella pneumoniae and Enterobacterales bacteremia on blood culture from 03/27/2024   MRCP showed chronic mild intra and extrahepatic biliary ductal dilatation, slightly worsened since MRI from 9/22 with no choledocholithiasis, no obstructing mass, no discrete biliary strictures  Polycystic kidney and liver disease with no suspicious masses BUN 11, creatinine 1.4, GFR 44 WBC 3.2, hemoglobin 9.6, platelet 133 Afebrile in the last 24 hours  Plan: Okay to DC home from GI standpoint. I would follow IDs recommendation regarding oral antibiotics to be taken at home. Currently on ceftriaxone  2 g every 24 hours with improvement in symptoms and requesting to be discharged home.  Estelita Manas, MD 03/31/2024, 9:45 AM

## 2024-03-31 NOTE — Telephone Encounter (Signed)
 Post ED Visit - Positive Culture Follow-up  Culture report reviewed by antimicrobial stewardship pharmacist: [facility] []  Rankin Dee, Pharm.D. []  Venetia Gully, Pharm.D., BCPS AQ-ID []  Garrel Crews, Pharm.D., BCPS []  Almarie Lunger, Pharm.D., BCPS []  [place], [place].D., BCPS, AAHIVP []  Rosaline Bihari, Pharm.D., BCPS, AAHIVP []  Vernell Meier, PharmD, BCPS []  Latanya Hint, PharmD, BCPS []  Donald Medley, PharmD, BCPS []  Rocky Bold, PharmD []  Dorothyann Alert, PharmD, BCPS [x]  Dorn Buttner, PharmD  [facility] []  Rosaline Edison, PharmD []  Romona Bliss, PharmD []  Dolphus Roller, PharmD []  Veva Seip, Rph []  Vernell Daunt) Leonce, PharmD []  Eva Allis, PharmD []  Rosaline Millet, PharmD []  Iantha Batch, PharmD []  Arvin Gauss, PharmD []  Wanda Hasting, PharmD []  Ronal Rav, PharmD []  Rocky Slade, PharmD []  Bard Jeans, PharmD   Positive blood culture Currently admitted at [facility] for same. No action needed  Judy Manning 03/31/2024, 1:02 PM

## 2024-04-02 LAB — CULTURE, BLOOD (ROUTINE X 2)
Culture: NO GROWTH
Culture: NO GROWTH
Special Requests: ADEQUATE
Special Requests: ADEQUATE

## 2024-04-03 ENCOUNTER — Telehealth: Payer: Self-pay | Admitting: Infectious Disease

## 2024-04-03 DIAGNOSIS — R7881 Bacteremia: Secondary | ICD-10-CM

## 2024-04-03 DIAGNOSIS — K7689 Other specified diseases of liver: Secondary | ICD-10-CM

## 2024-04-03 DIAGNOSIS — K769 Liver disease, unspecified: Secondary | ICD-10-CM

## 2024-04-03 NOTE — Telephone Encounter (Signed)
   I saw Judy Manning recently was a long for Klebsiella bacteremia.  Previously she had Klebsiella infecting one of her liver cyst but MRI did not show evidence of infection we still have concern clinically that one of the cyst might be infected but we do not have proof via imaging I would like to order a PET scan if it is covered by her insurance to see if there is evidence of hypermetabolism in the liver and then she indeed does have an infected cyst.

## 2024-04-11 ENCOUNTER — Telehealth: Payer: Self-pay

## 2024-04-11 ENCOUNTER — Encounter: Payer: Self-pay | Admitting: Infectious Diseases

## 2024-04-11 ENCOUNTER — Other Ambulatory Visit: Payer: Self-pay

## 2024-04-11 ENCOUNTER — Ambulatory Visit: Admitting: Infectious Diseases

## 2024-04-11 VITALS — BP 123/66 | HR 111 | Temp 98.1°F | Wt 121.0 lb

## 2024-04-11 DIAGNOSIS — M059 Rheumatoid arthritis with rheumatoid factor, unspecified: Secondary | ICD-10-CM | POA: Diagnosis not present

## 2024-04-11 DIAGNOSIS — R197 Diarrhea, unspecified: Secondary | ICD-10-CM

## 2024-04-11 DIAGNOSIS — Z79899 Other long term (current) drug therapy: Secondary | ICD-10-CM

## 2024-04-11 DIAGNOSIS — K7689 Other specified diseases of liver: Secondary | ICD-10-CM

## 2024-04-11 DIAGNOSIS — R7881 Bacteremia: Secondary | ICD-10-CM

## 2024-04-11 DIAGNOSIS — B961 Klebsiella pneumoniae [K. pneumoniae] as the cause of diseases classified elsewhere: Secondary | ICD-10-CM

## 2024-04-11 NOTE — Telephone Encounter (Signed)
 Patient called stating that she is sweating profusely and nauseated since this morning. Patient BP is elevated 164/95, pulse 115. Temp was 97.9. taking cefadroxil  as directed. Denies any other symptoms. advised I would route to provider and call her back.   [phone].  Anjeli Casad SHAUNNA Letters, CMA

## 2024-04-11 NOTE — Progress Notes (Signed)
 Patient Active Problem List   Diagnosis Date Noted   Diarrhea 04/11/2024   Medication management 04/11/2024   AKI (acute kidney injury) (HCC) 03/29/2024   Anemia 03/29/2024   Bacteremia due to Klebsiella pneumoniae 03/28/2024   Acne 01/09/2024   Hypotension 09/27/2023   Sepsis due to undetermined organism (HCC) 09/26/2023   Toenail deformity 06/28/2022   Liver cyst 05/03/2021   Liver, polycystic with compressive symptoms 05/03/2021   SIRS (systemic inflammatory response syndrome) (HCC) 04/08/2021   Right upper quadrant pain 04/08/2021   Common bile duct dilatation 04/08/2021   History of migraine headaches 04/08/2021   Hypokalemia, inadequate intake 04/08/2021   Hyponatremia 04/08/2021   Other hyperlipidemia 06/17/2020   History of vitamin D  deficiency 06/17/2020   Endometrial cells on cervical Pap smear inconsistent w/LMP 09/18/2018   Fibroid 09/18/2018   Menorrhagia with irregular cycle 09/18/2018   History of basal cell carcinoma (BCC) 03/23/2018   Seropositive rheumatoid arthritis (HCC) 03/23/2018   Other fatigue 03/23/2018   Ganglion cyst 10/31/2017   Bilateral carpal tunnel syndrome 06/18/2017   Recurrent UTI 07/30/2013   Migraine 05/06/2013   Polycystic kidney disease 05/06/2013    Patient's Medications  New Prescriptions   No medications on file  Previous Medications   ACETAMINOPHEN  (TYLENOL ) 325 MG TABLET    Take 2 tablets (650 mg total) by mouth every 6 (six) hours as needed for mild pain (pain score 1-3) or fever (or Fever >/= 101).   AIMOVIG 140 MG/ML SOAJ    Inject 140 mg into the skin every 28 (twenty-eight) days.   B COMPLEX VITAMINS (B COMPLEX-B12) TABS    Take 1 tablet by mouth daily as needed (for deficiency).   CEFADROXIL  (DURICEF) 500 MG CAPSULE    Take 2 capsules (1,000 mg total) by mouth 2 (two) times daily.   CETIRIZINE (ZYRTEC) 10 MG TABLET    Take 10 mg by mouth daily as needed for allergies or rhinitis.   CLONAZEPAM  (KLONOPIN ) 0.5 MG TABLET     Take 0.5 mg by mouth 2 (two) times daily as needed for anxiety (or sleep).   DULOXETINE  (CYMBALTA ) 30 MG CAPSULE    Take 1 capsule (30 mg total) by mouth daily.   ELETRIPTAN  (RELPAX ) 40 MG TABLET    Take 40 mg by mouth daily as needed (for breakthrough migraines- may take an additional 40 mg once, if no relief after an hour - Max of 80 mg/24 hours).   ENALAPRIL  (VASOTEC ) 2.5 MG TABLET    Take 2.5 mg by mouth daily.   GABAPENTIN  (NEURONTIN ) 300 MG CAPSULE    TAKE 1 CAPSULE BY MOUTH  TWICE DAILY   HYDROXYCHLOROQUINE  (PLAQUENIL ) 200 MG TABLET    Take 1 tablet (200 mg total) by mouth daily.   LOPERAMIDE  (IMODIUM ) 2 MG CAPSULE    Take 1 capsule (2 mg total) by mouth as needed for diarrhea or loose stools.   MULTIPLE VITAMINS-MINERALS (MULTIVITAMIN GUMMIES ADULT) CHEW    Chew 2 tablets by mouth 3 (three) times a week.   PREDNISONE  (DELTASONE ) 5 MG TABLET    Take 2 tablets (10 mg total) by mouth daily with breakfast.   SACCHAROMYCES BOULARDII (FLORASTOR) 250 MG CAPSULE    Take 1 capsule (250 mg total) by mouth 2 (two) times daily.   ZOLPIDEM  (AMBIEN ) 10 MG TABLET    TAKE 1/2 TO 1 TABLET BY  MOUTH AT BEDTIME AS NEEDED  FOR SLEEP  Modified Medications   No medications on file  Discontinued  Medications   No medications on file    Subjective: Discussed the use of AI scribe software for clinical note transcription with the patient, who gave verbal consent to proceed.   58 Y O female with h/o Polycystic kidney and liver disease, RA on plaquenil  and chronic steroids, infected Klebsiella pneumoniae hepatic cyst in 2022 who is here for HFU after recent admission 8/28-8/31 with Klebsiella pneumoniae bacteremia.   Seen by ID in the hospital and work up with CT abdomen pelvis and MR abdomen was negative for infective source although concerns for GI etiology for bacteremia. Discharged on 8/31 on cefadroxil  to complete a month course and fu with ID.   9/11 Patient called clinic today due to sudden onset  sweating, palpitations, nausea, high BP which lasted for 15 mins and resolved on its own. She did not have fevers when checked temperature at home, 97.11F. She tells me she felt similarly when she had infected cyst and got worried. Staff had reported concerns for diarrhea but on talking to her she tells me her stool is not liquid and it is in bits and pieces, not watery/blood. Minimal abdominal cramps if any. She feels OK by the time of my evaluation. Denies any vomiting. She has been taking cefadroxil  as instructed without missing doses or concerns.   Denies cough, chest pain, or shortness of breath. Denies GU complaints.   Review of Systems: all systems reviewed with pertinent positives and negatives as listed above   Past Medical History:  Diagnosis Date   COVID 12/2020   Detached retina    Liver cyst 05/03/2021   Liver, polycystic with compressive symptoms 05/03/2021   Polycystic kidney disease    Sepsis (HCC)    Past Surgical History:  Procedure Laterality Date   ABDOMINAL HERNIA REPAIR     AUGMENTATION MAMMAPLASTY     CARPAL TUNNEL RELEASE Right    GANGLION CYST EXCISION Right    RETINAL TEAR REPAIR CRYOTHERAPY Bilateral    tendonitis surgery elbow     VITRECTOMY      Social History   Tobacco Use   Smoking status: Never    Passive exposure: Past   Smokeless tobacco: Never  Vaping Use   Vaping status: Never Used  Substance Use Topics   Alcohol use: Not Currently    Comment: 1 yearly   Drug use: Never    Family History  Problem Relation Age of Onset   Rheum arthritis Mother    Heart disease Mother        Pacemaker   COPD Mother        Smoker   Skin cancer Mother    Emphysema Mother    Heart Problems Mother    Stroke Father    Polycystic kidney disease Father    Healthy Sister    Arthritis Brother    Healthy Brother    Alzheimer's disease Maternal Grandmother 19   Diabetes Paternal Grandfather    Skin cancer Paternal Grandfather    Cancer Paternal  Grandfather    Healthy Daughter    Breast cancer Neg Hx    Colon cancer Neg Hx     Allergies  Allergen Reactions   Imitrex  [Sumatriptan ] Swelling and Other (See Comments)    Throat gets tight   Latex Itching   Ibuprofen Other (See Comments)    Polycystic kidney disease   Nitrofurantoin Rash   Oseltamivir Nausea And Vomiting    Health Maintenance  Topic Date Due   COVID-19 Vaccine (1) Never done  Hepatitis C Screening  Never done   Hepatitis B Vaccines 19-59 Average Risk (1 of 3 - 19+ 3-dose series) Never done   Zoster Vaccines- Shingrix (1 of 2) Never done   Cervical Cancer Screening (HPV/Pap Cotest)  Never done   Colonoscopy  Never done   Pneumococcal Vaccine: 50+ Years (2 of 2 - PCV) 05/05/2018   Mammogram  01/27/2020   Influenza Vaccine  03/01/2024   DTaP/Tdap/Td (2 - Td or Tdap) 06/17/2027   HIV Screening  Completed   HPV VACCINES  Aged Out   Meningococcal B Vaccine  Aged Out    Objective: BP 123/66   Pulse (!) 111   Temp 98.1 F (36.7 C) (Oral)   Wt 121 lb (54.9 kg)   SpO2 97%   BMI 23.63 kg/m    Physical Exam Constitutional:      Appearance: Normal appearance.  HENT:     Head: Normocephalic and atraumatic.      Mouth: Mucous membranes are moist.  Eyes:    Conjunctiva/sclera: Conjunctivae normal.     Pupils: Pupils are equal, round, and b/l symmetrical    Cardiovascular:     Rate and Rhythm: Normal rate and regular rhythm.     Heart sounds: s1s2  Pulmonary:     Effort: Pulmonary effort is normal.     Breath sounds: Normal breath sounds.   Abdominal:     General: Non distended     Palpations: soft, non tender  Musculoskeletal:        General: Normal range of motion.   Skin:    General: Skin is warm and dry.     Comments:  Neurological:     General: grossly non focal     Mental Status: awake, alert and oriented to person, place, and time.   Psychiatric:        Mood and Affect: Mood normal.   Lab Results Lab Results  Component  Value Date   WBC 3.2 (L) 03/31/2024   HGB 9.6 (L) 03/31/2024   HCT 31.3 (L) 03/31/2024   MCV 87.7 03/31/2024   PLT 133 (L) 03/31/2024    Lab Results  Component Value Date   CREATININE 1.40 (H) 03/31/2024   BUN 11 03/31/2024   NA 142 03/31/2024   K 3.7 03/31/2024   CL 110 03/31/2024   CO2 22 03/31/2024    Lab Results  Component Value Date   ALT 13 03/30/2024   AST 21 03/30/2024   ALKPHOS 62 03/30/2024   BILITOT 0.3 03/30/2024    Lab Results  Component Value Date   CHOL 263 (H) 06/17/2020   HDL 78 06/17/2020   LDLCALC 166 (H) 06/17/2020   TRIG 84 06/17/2020   CHOLHDL 3.4 06/17/2020   No results found for: LABRPR, RPRTITER No results found for: HIV1RNAQUANT, HIV1RNAVL, CD4TABS   Microbiology  Results for orders placed or performed in visit on 04/11/24  Blood culture (routine single)     Status: None (Preliminary result)   Collection Time: 04/11/24  3:24 AM   Specimen: Blood  Result Value Ref Range Status   MICRO NUMBER: [id number]  Preliminary   SPECIMEN QUALITY: Adequate  Preliminary   Source BLOOD 1  Preliminary   STATUS: PRELIMINARY  Preliminary   Result:   Preliminary    No growth to date. Culture is continuously monitored for a total of 120 hours incubation. A change in status will result in a phone report followed by an updated printed culture report.   COMMENT: Aerobic  and anaerobic bottle received.  Preliminary  Blood culture (routine single)     Status: None (Preliminary result)   Collection Time: 04/11/24  3:30 PM   Specimen: Blood  Result Value Ref Range Status   MICRO NUMBER: [id number]  Preliminary   SPECIMEN QUALITY: Adequate  Preliminary   Source BLOOD 2  Preliminary   STATUS: PRELIMINARY  Preliminary   Result:   Preliminary    No growth to date. Culture is continuously monitored for a total of 120 hours incubation. A change in status will result in a phone report followed by an updated printed culture report.   COMMENT: Aerobic and anaerobic  bottle received.  Preliminary   Imaging  MR ABDOMEN MRCP W WO CONTAST Result Date: 03/30/2024 CLINICAL DATA:  58 year old female inpatient with abdominal pain and Klebsiella pneumoniae bacteremia. EXAM: MRI ABDOMEN WITHOUT AND WITH CONTRAST (INCLUDING MRCP) TECHNIQUE: Multiplanar multisequence MR imaging of the abdomen was performed both before and after the administration of intravenous contrast. Heavily T2-weighted images of the biliary and pancreatic ducts were obtained, and three-dimensional MRCP images were rendered by post processing. CONTRAST:  6mL GADAVIST  GADOBUTROL  1 MMOL/ML IV SOLN COMPARISON:  04/09/2021 MRI abdomen.  03/28/2024 CT abdomen/pelvis. FINDINGS: Lower chest: Trace layering bilateral pleural effusions. Bilateral breast prostheses partially visualized. Hepatobiliary: Liver is mildly enlarged by innumerable liver cysts scattered throughout the liver, including simple and mildly complex liver cysts, a few of which demonstrate internal hemorrhagic/proteinaceous signal, and none of which demonstrate enhancement or wall thickening. Representative 1.6 x 1.3 cm hemorrhagic/proteinaceous segment 3 left liver cyst on series 12/image 39. Representative dominant 4.8 x 4.2 cm segment 7 right liver cyst with minimal layering hemorrhagic/proteinaceous material on series 4/image 7. no suspicious liver masses. No hepatic steatosis. Normal gallbladder with no cholelithiasis. Chronic dilatation of the common bile duct with common bile duct diameter 11 mm. Chronic mild diffuse intrahepatic biliary ductal dilatation. The intrahepatic and extrahepatic biliary ductal dilatation has slightly worsened since 04/08/2021 MRI, as best seen in the right liver on coronal series 3/image 10, with previous common bile duct diameter 10 mm. No choledocholithiasis. No biliary mass, wall thickening or hyperenhancement. No discrete biliary strictures. Pancreas: No pancreatic mass or duct dilation.  No pancreas divisum. Spleen:  Normal size. No mass. Adrenals/Urinary Tract: Normal adrenals. No hydronephrosis. 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 No suspicious renal masses identified. Representative 1.5 cm posterior interpolar right kidney (series 12/image 55) and 1.4 cm anterior lower left kidney (series 12/image 67) T1 hyperintense renal lesions without appreciable enhancement compatible with Bosniak category 2 hemorrhagic/proteinaceous renal cysts. Representative dominant 6.6 cm simple inferior right renal cyst. Stomach/Bowel: Normal non-distended stomach. Visualized small and large bowel is normal caliber, with no bowel wall thickening. Vascular/Lymphatic: Normal caliber abdominal aorta. Patent portal, splenic, hepatic and renal veins. No pathologically enlarged lymph nodes in the abdomen. Other: No abdominal ascites or focal fluid collection. Musculoskeletal: No aggressive appearing focal osseous lesions. IMPRESSION: 1. No acute abnormality. 2. Chronic mild intrahepatic and extrahepatic biliary ductal dilatation, slightly worsened since 04/08/2021 MRI. No choledocholithiasis. No obstructing mass. No discrete biliary strictures. 3. Polycystic kidney and liver disease. No suspicious masses. 4. Trace layering bilateral pleural effusions. Electronically Signed   By: Selinda DELENA Blue M.D.   On: 03/30/2024 12:56   MR 3D Recon At Scanner Result Date: 03/30/2024 CLINICAL DATA:  59 year old female inpatient with abdominal pain and Klebsiella pneumoniae bacteremia. EXAM: MRI ABDOMEN WITHOUT  AND WITH CONTRAST (INCLUDING MRCP) TECHNIQUE: Multiplanar multisequence MR imaging of the abdomen was performed both before and after the administration of intravenous contrast. Heavily T2-weighted images of the biliary and pancreatic ducts were obtained, and three-dimensional MRCP images were rendered by post processing. CONTRAST:  6mL  GADAVIST  GADOBUTROL  1 MMOL/ML IV SOLN COMPARISON:  04/09/2021 MRI abdomen.  03/28/2024 CT abdomen/pelvis. FINDINGS: Lower chest: Trace layering bilateral pleural effusions. Bilateral breast prostheses partially visualized. Hepatobiliary: Liver is mildly enlarged by innumerable liver cysts scattered throughout the liver, including simple and mildly complex liver cysts, a few of which demonstrate internal hemorrhagic/proteinaceous signal, and none of which demonstrate enhancement or wall thickening. Representative 1.6 x 1.3 cm hemorrhagic/proteinaceous segment 3 left liver cyst on series 12/image 39. Representative dominant 4.8 x 4.2 cm segment 7 right liver cyst with minimal layering hemorrhagic/proteinaceous material on series 4/image 7. no suspicious liver masses. No hepatic steatosis. Normal gallbladder with no cholelithiasis. Chronic dilatation of the common bile duct with common bile duct diameter 11 mm. Chronic mild diffuse intrahepatic biliary ductal dilatation. The intrahepatic and extrahepatic biliary ductal dilatation has slightly worsened since 04/08/2021 MRI, as best seen in the right liver on coronal series 3/image 10, with previous common bile duct diameter 10 mm. No choledocholithiasis. No biliary mass, wall thickening or hyperenhancement. No discrete biliary strictures. Pancreas: No pancreatic mass or duct dilation.  No pancreas divisum. Spleen: Normal size. No mass. Adrenals/Urinary Tract: Normal adrenals. No hydronephrosis. 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 No suspicious renal masses identified. Representative 1.5 cm posterior interpolar right kidney (series 12/image 55) and 1.4 cm anterior lower left kidney (series 12/image 67) T1 hyperintense renal lesions without appreciable enhancement compatible with Bosniak category 2 hemorrhagic/proteinaceous renal cysts. Representative  dominant 6.6 cm simple inferior right renal cyst. Stomach/Bowel: Normal non-distended stomach. Visualized small and large bowel is normal caliber, with no bowel wall thickening. Vascular/Lymphatic: Normal caliber abdominal aorta. Patent portal, splenic, hepatic and renal veins. No pathologically enlarged lymph nodes in the abdomen. Other: No abdominal ascites or focal fluid collection. Musculoskeletal: No aggressive appearing focal osseous lesions. IMPRESSION: 1. No acute abnormality. 2. Chronic mild intrahepatic and extrahepatic biliary ductal dilatation, slightly worsened since 04/08/2021 MRI. No choledocholithiasis. No obstructing mass. No discrete biliary strictures. 3. Polycystic kidney and liver disease. No suspicious masses. 4. Trace layering bilateral pleural effusions. Electronically Signed   By: Selinda DELENA Blue M.D.   On: 03/30/2024 12:56   CT ABDOMEN PELVIS WO CONTRAST Result Date: 03/28/2024 EXAM: CT ABDOMEN AND PELVIS WITHOUT CONTRAST 03/28/2024 08:19:05 AM TECHNIQUE: CT of the abdomen and pelvis was performed without the administration of intravenous contrast. Multiplanar reformatted images are provided for review. Automated exposure control, iterative reconstruction, and/or weight-based adjustment of the mA/kV was utilized to reduce the radiation dose to as low as reasonably achievable. COMPARISON: 09/26/2023 CLINICAL HISTORY: Abdominal pain, acute, nonlocalized. Patient states she was seen at [facility] yesterday and was called today and told her blood cultures were positive and she needed to come back to be admitted. Diarrhea since yesterday. FINDINGS: LOWER CHEST: Visualized lung bases are clear. LIVER: Innumerable cysts identified throughout the liver. The distribution and morphology of the liver and liver cysts appears unchanged from the prior exam, compatible with polycystic liver disease. GALLBLADDER AND BILE DUCTS: The gallbladder appears normal. Unchanged common bile duct dilatation  and mild intrahepatic biliary dilatation. The common bile duct measures up to 1.1 cm, image  49/4. Unchanged from prior exam. No calcified stones identified along the course of the CBD. SPLEEN: Normal size. No focal lesion. PANCREAS: No mass. No ductal dilatation. ADRENAL GLANDS: Normal appearance. No mass. KIDNEYS, URETERS AND BLADDER: Bilateral nephromegaly with innumerable bilateral kidney cysts, compatible with polycystic kidney disease. Cysts are predominantly fluid attenuation. The largest cyst arises off the inferior pole of the right kidney measuring 6.3 cm, image 46/2. Additional small scattered hyperdense kidney cysts are identified which are technically incompletely characterized without IV contrast but may reflect hemorrhagic or proteinaceous cysts. No signs of obstructive uropathy. No ureteral calculi identified bilaterally. Urinary bladder is normal. GI AND BOWEL: No bowel dilatation to suggest bowel obstruction. No focal bowel inflammatory change identified. The appendix is not identified separate from the right lower quadrant bowel loops. No secondary signs of acute appendicitis noted. Liquid stool noted within the right colon. PERITONEUM AND RETROPERITONEUM: No free fluid or fluid collections. No signs of pneumoperitoneum. VASCULATURE: Normal caliber of the abdominal aorta. LYMPH NODES: No lymphadenopathy. REPRODUCTIVE ORGANS: Uterus and adnexal structures are unremarkable. BONES AND SOFT TISSUES: No acute osseous abnormality. No focal soft tissue abnormality. IMPRESSION: 1. No signs of bowel obstruction or inflammation. 2. Polycystic liver disease with innumerable cysts, unchanged from prior exam. 3. Polycystic kidney disease with bilateral nephromegaly and innumerable cysts, including incompletely characterized hyperdense cysts. 4. Stable common bile duct dilatation (up to 1.1 cm) and mild intrahepatic biliary dilatation, without signs of calcified choledocholithiasis. Electronically signed by:  Waddell Calk MD 03/28/2024 08:35 AM EDT RP Workstation: [id number]   DG Chest Port 1 View Result Date: 03/27/2024 CLINICAL DATA:  Fever, chills, headache, nausea since yesterday. History of sepsis and immunocompromised. EXAM: PORTABLE CHEST 1 VIEW COMPARISON:  09/26/2023 FINDINGS: The heart size and mediastinal contours are within normal limits. Both lungs are clear. The visualized skeletal structures are unremarkable. IMPRESSION: No active disease. Electronically Signed   By: Elspeth Bathe M.D.   On: 03/27/2024 09:51   Assessment/Plan # Recent klebsiella bacteremia without definite source, presumed to be infected hepatic cyst # Sudden onset sweating, palpitations, nausea, high BP ? Diarrhea   Plan  - unclear what her cause of sudden above symptoms are that resolved spontaneously. Does not seem to have diarrhea as such. She has completed 2 weeks of antibiotics from negative blood cultures and will stop antibiotics for now.  - Blood cx 2 sets, CBC and CMP - Do not think she has C diff and hence will not empirically treat for it. Provided however cup for stool collection in case she has diarrhea.  - She can fu with Dr Fleeta Rothman 9/17 to review labs/clinical fu if further work up for hepatic cyst needed. Agrees to plan.  # RA - on hydroxychloroquine , prednisone  10 mg po daily   I spent 40 minutes involved in face-to-face and non-face-to-face activities for this patient on the day of the visit. Professional time spent includes the following activities: Preparing to see the patient (review of tests), Obtaining and reviewing separately obtained history (discharge record 8/31), Performing a medically appropriate examination and evaluation, Ordering labs, referring and communicating with other health care professionals, Documenting clinical information in the EMR, Independently interpreting results (not separately reported), Communicating results to the patient, Counseling and educating the patient and Care  coordination (not separately reported).   Of note, portions of this note may have been created with voice recognition software. While this note has been edited for accuracy, occasional wrong-word or 'sound-a-like' substitutions may have occurred due  to the inherent limitations of voice recognition software.   Annalee Joseph, MD [facility] 04/11/2024, 3:14 PM

## 2024-04-11 NOTE — Telephone Encounter (Signed)
 Patient coming in to see Dr.Manandhar today at 2:45

## 2024-04-12 ENCOUNTER — Encounter (HOSPITAL_COMMUNITY): Admission: RE | Admit: 2024-04-12 | Source: Ambulatory Visit

## 2024-04-16 NOTE — Progress Notes (Unsigned)
 Subjective:  Chief complaint: follow-up for klebsiella bacteremia and hx of infected liver cyst   Patient ID: Judy Manning, female    DOB: 11/11/1965, 58 y.o.   MRN: [id number]  HPI  Past Medical History:  Diagnosis Date   COVID 12/2020   Detached retina    Liver cyst 05/03/2021   Liver, polycystic with compressive symptoms 05/03/2021   Polycystic kidney disease    Sepsis (HCC)     Past Surgical History:  Procedure Laterality Date   ABDOMINAL HERNIA REPAIR     AUGMENTATION MAMMAPLASTY     CARPAL TUNNEL RELEASE Right    GANGLION CYST EXCISION Right    RETINAL TEAR REPAIR CRYOTHERAPY Bilateral    tendonitis surgery elbow     VITRECTOMY      Family History  Problem Relation Age of Onset   Rheum arthritis Mother    Heart disease Mother        Pacemaker   COPD Mother        Smoker   Skin cancer Mother    Emphysema Mother    Heart Problems Mother    Stroke Father    Polycystic kidney disease Father    Healthy Sister    Arthritis Brother    Healthy Brother    Alzheimer's disease Maternal Grandmother 17   Diabetes Paternal Grandfather    Skin cancer Paternal Grandfather    Cancer Paternal Grandfather    Healthy Daughter    Breast cancer Neg Hx    Colon cancer Neg Hx       Social History   Socioeconomic History   Marital status: Married    Spouse name: Not on file   Number of children: 1   Years of education: Not on file   Highest education level: Not on file  Occupational History   Not on file  Tobacco Use   Smoking status: Never    Passive exposure: Past   Smokeless tobacco: Never  Vaping Use   Vaping status: Never Used  Substance and Sexual Activity   Alcohol use: Not Currently    Comment: 1 yearly   Drug use: Never   Sexual activity: Not on file  Other Topics Concern   Not on file  Social History Narrative   Right Handed    Lives in a two story home    Social Drivers of Health   Financial Resource Strain: Not on file  Food  Insecurity: No Food Insecurity (03/28/2024)   Hunger Vital Sign    Worried About Running Out of Food in the Last Year: Never true    Ran Out of Food in the Last Year: Never true  Transportation Needs: No Transportation Needs (03/28/2024)   PRAPARE - Administrator, Civil Service (Medical): No    Lack of Transportation (Non-Medical): No  Physical Activity: Not on file  Stress: Not on file  Social Connections: Unknown (12/12/2021)   Received from [facility]   Social Network    Social Network: Not on file    Allergies  Allergen Reactions   Imitrex  [Sumatriptan ] Swelling and Other (See Comments)    Throat gets tight   Latex Itching   Ibuprofen Other (See Comments)    Polycystic kidney disease   Nitrofurantoin Rash   Oseltamivir Nausea And Vomiting     Current Outpatient Medications:    acetaminophen  (TYLENOL ) 325 MG tablet, Take 2 tablets (650 mg total) by mouth every 6 (six) hours as needed for mild pain (pain  score 1-3) or fever (or Fever >/= 101)., Disp: , Rfl:    AIMOVIG 140 MG/ML SOAJ, Inject 140 mg into the skin every 28 (twenty-eight) days., Disp: , Rfl:    B Complex Vitamins (B COMPLEX-B12) TABS, Take 1 tablet by mouth daily as needed (for deficiency)., Disp: , Rfl:    cefadroxil  (DURICEF) 500 MG capsule, Take 2 capsules (1,000 mg total) by mouth 2 (two) times daily., Disp: 120 capsule, Rfl: 1   cetirizine (ZYRTEC) 10 MG tablet, Take 10 mg by mouth daily as needed for allergies or rhinitis., Disp: , Rfl:    clonazePAM  (KLONOPIN ) 0.5 MG tablet, Take 0.5 mg by mouth 2 (two) times daily as needed for anxiety (or sleep)., Disp: , Rfl:    DULoxetine  (CYMBALTA ) 30 MG capsule, Take 1 capsule (30 mg total) by mouth daily. (Patient taking differently: Take 30 mg by mouth in the morning.), Disp: 30 capsule, Rfl: 3   eletriptan  (RELPAX ) 40 MG tablet, Take 40 mg by mouth daily as needed (for breakthrough migraines- may take an additional 40 mg once, if no relief after an hour  - Max of 80 mg/24 hours)., Disp: , Rfl:    enalapril  (VASOTEC ) 2.5 MG tablet, Take 2.5 mg by mouth daily., Disp: , Rfl:    gabapentin  (NEURONTIN ) 300 MG capsule, TAKE 1 CAPSULE BY MOUTH  TWICE DAILY (Patient taking differently: Take 300 mg by mouth in the morning and at bedtime.), Disp: 180 capsule, Rfl: 3   hydroxychloroquine  (PLAQUENIL ) 200 MG tablet, Take 1 tablet (200 mg total) by mouth daily., Disp: 90 tablet, Rfl: 1   loperamide  (IMODIUM ) 2 MG capsule, Take 1 capsule (2 mg total) by mouth as needed for diarrhea or loose stools., Disp: , Rfl:    Multiple Vitamins-Minerals (MULTIVITAMIN GUMMIES ADULT) CHEW, Chew 2 tablets by mouth 3 (three) times a week., Disp: , Rfl:    predniSONE  (DELTASONE ) 5 MG tablet, Take 2 tablets (10 mg total) by mouth daily with breakfast. (Patient taking differently: Take 5 mg by mouth daily with breakfast.), Disp: 60 tablet, Rfl: 1   saccharomyces boulardii (FLORASTOR) 250 MG capsule, Take 1 capsule (250 mg total) by mouth 2 (two) times daily., Disp: 60 capsule, Rfl: 0   zolpidem  (AMBIEN ) 10 MG tablet, TAKE 1/2 TO 1 TABLET BY  MOUTH AT BEDTIME AS NEEDED  FOR SLEEP (Patient taking differently: Take 5-10 mg by mouth at bedtime as needed for sleep.), Disp: 90 tablet, Rfl: 1    Review of Systems     Objective:   Physical Exam        Assessment & Plan:

## 2024-04-17 ENCOUNTER — Ambulatory Visit: Payer: Self-pay | Admitting: Infectious Disease

## 2024-04-17 ENCOUNTER — Other Ambulatory Visit: Payer: Self-pay

## 2024-04-17 ENCOUNTER — Encounter: Payer: Self-pay | Admitting: Infectious Disease

## 2024-04-17 VITALS — BP 118/75 | HR 97 | Temp 98.6°F | Ht 60.0 in | Wt 119.0 lb

## 2024-04-17 DIAGNOSIS — N179 Acute kidney failure, unspecified: Secondary | ICD-10-CM | POA: Diagnosis not present

## 2024-04-17 DIAGNOSIS — Q613 Polycystic kidney, unspecified: Secondary | ICD-10-CM

## 2024-04-17 DIAGNOSIS — M059 Rheumatoid arthritis with rheumatoid factor, unspecified: Secondary | ICD-10-CM

## 2024-04-17 DIAGNOSIS — Q446 Cystic disease of liver: Secondary | ICD-10-CM

## 2024-04-17 DIAGNOSIS — R7881 Bacteremia: Secondary | ICD-10-CM | POA: Diagnosis not present

## 2024-04-17 DIAGNOSIS — B961 Klebsiella pneumoniae [K. pneumoniae] as the cause of diseases classified elsewhere: Secondary | ICD-10-CM

## 2024-04-17 DIAGNOSIS — R197 Diarrhea, unspecified: Secondary | ICD-10-CM

## 2024-04-17 LAB — BASIC METABOLIC PANEL WITH GFR
BUN/Creatinine Ratio: 12 (calc) (ref 6–22)
BUN: 24 mg/dL (ref 7–25)
CO2: 31 mmol/L (ref 20–32)
Calcium: 9.8 mg/dL (ref 8.6–10.4)
Chloride: 102 mmol/L (ref 98–110)
Creat: 1.97 mg/dL — ABNORMAL HIGH (ref 0.50–1.03)
Glucose, Bld: 149 mg/dL — ABNORMAL HIGH (ref 65–99)
Potassium: 4.8 mmol/L (ref 3.5–5.3)
Sodium: 141 mmol/L (ref 135–146)
eGFR: 29 mL/min/1.73m2 — ABNORMAL LOW (ref >=60)

## 2024-04-17 LAB — CULTURE, BLOOD (SINGLE)
MICRO NUMBER:: 16956989
MICRO NUMBER:: 16956990
Result:: NO GROWTH
Result:: NO GROWTH
SPECIMEN QUALITY:: ADEQUATE
SPECIMEN QUALITY:: ADEQUATE

## 2024-04-17 LAB — CBC
HCT: 36.2 % (ref 35.0–45.0)
Hemoglobin: 11.7 g/dL (ref 11.7–15.5)
MCH: 27.1 pg (ref 27.0–33.0)
MCHC: 32.3 g/dL (ref 32.0–36.0)
MCV: 84 fL (ref 80.0–100.0)
MPV: 10.8 fL (ref 7.5–12.5)
Platelets: 361 Thousand/uL (ref 140–400)
RBC: 4.31 Million/uL (ref 3.80–5.10)
RDW: 14.2 % (ref 11.0–15.0)
WBC: 6.5 Thousand/uL (ref 3.8–10.8)

## 2024-04-18 ENCOUNTER — Ambulatory Visit: Payer: Self-pay | Admitting: Infectious Diseases

## 2024-07-30 ENCOUNTER — Encounter (HOSPITAL_BASED_OUTPATIENT_CLINIC_OR_DEPARTMENT_OTHER): Payer: Self-pay

## 2024-07-30 ENCOUNTER — Observation Stay (HOSPITAL_BASED_OUTPATIENT_CLINIC_OR_DEPARTMENT_OTHER)
Admission: EM | Admit: 2024-07-30 | Discharge: 2024-08-02 | Disposition: A | Discharge status: Home / Self Care | Source: Ambulatory Visit | Attending: Emergency Medicine | Admitting: Emergency Medicine

## 2024-07-30 ENCOUNTER — Emergency Department (HOSPITAL_BASED_OUTPATIENT_CLINIC_OR_DEPARTMENT_OTHER)

## 2024-07-30 ENCOUNTER — Other Ambulatory Visit: Payer: Self-pay

## 2024-07-30 ENCOUNTER — Encounter: Payer: Self-pay | Admitting: Infectious Disease

## 2024-07-30 DIAGNOSIS — Z91040 Latex allergy status: Secondary | ICD-10-CM | POA: Diagnosis not present

## 2024-07-30 DIAGNOSIS — F32A Depression, unspecified: Secondary | ICD-10-CM | POA: Diagnosis not present

## 2024-07-30 DIAGNOSIS — Q613 Polycystic kidney, unspecified: Secondary | ICD-10-CM | POA: Diagnosis not present

## 2024-07-30 DIAGNOSIS — N179 Acute kidney failure, unspecified: Principal | ICD-10-CM | POA: Insufficient documentation

## 2024-07-30 DIAGNOSIS — E7849 Other hyperlipidemia: Secondary | ICD-10-CM | POA: Diagnosis not present

## 2024-07-30 DIAGNOSIS — Z79899 Other long term (current) drug therapy: Secondary | ICD-10-CM | POA: Insufficient documentation

## 2024-07-30 DIAGNOSIS — R509 Fever, unspecified: Secondary | ICD-10-CM | POA: Diagnosis present

## 2024-07-30 DIAGNOSIS — N183 Chronic kidney disease, stage 3 unspecified: Secondary | ICD-10-CM | POA: Insufficient documentation

## 2024-07-30 DIAGNOSIS — D696 Thrombocytopenia, unspecified: Secondary | ICD-10-CM | POA: Diagnosis not present

## 2024-07-30 DIAGNOSIS — Z7722 Contact with and (suspected) exposure to environmental tobacco smoke (acute) (chronic): Secondary | ICD-10-CM | POA: Diagnosis not present

## 2024-07-30 DIAGNOSIS — R6511 Systemic inflammatory response syndrome (SIRS) of non-infectious origin with acute organ dysfunction: Secondary | ICD-10-CM | POA: Insufficient documentation

## 2024-07-30 DIAGNOSIS — M059 Rheumatoid arthritis with rheumatoid factor, unspecified: Secondary | ICD-10-CM | POA: Insufficient documentation

## 2024-07-30 DIAGNOSIS — R6510 Systemic inflammatory response syndrome (SIRS) of non-infectious origin without acute organ dysfunction: Secondary | ICD-10-CM | POA: Diagnosis present

## 2024-07-30 DIAGNOSIS — N1832 Chronic kidney disease, stage 3b: Secondary | ICD-10-CM | POA: Diagnosis not present

## 2024-07-30 DIAGNOSIS — A419 Sepsis, unspecified organism: Secondary | ICD-10-CM | POA: Diagnosis not present

## 2024-07-30 HISTORY — DX: Rheumatoid arthritis, unspecified: M06.9

## 2024-07-30 LAB — URINALYSIS, W/ REFLEX TO CULTURE (INFECTION SUSPECTED)
Bilirubin Urine: NEGATIVE
Glucose, UA: NEGATIVE mg/dL
Hgb urine dipstick: NEGATIVE
Ketones, ur: NEGATIVE mg/dL
Nitrite: NEGATIVE
Specific Gravity, Urine: 1.01 (ref 1.005–1.030)
pH: 7.5 (ref 5.0–8.0)

## 2024-07-30 LAB — CBC WITH DIFFERENTIAL/PLATELET
Abs Immature Granulocytes: 0.02 K/uL (ref 0.00–0.07)
Basophils Absolute: 0 K/uL (ref 0.0–0.1)
Basophils Relative: 0 %
Eosinophils Absolute: 0 K/uL (ref 0.0–0.5)
Eosinophils Relative: 0 %
HCT: 31.8 % — ABNORMAL LOW (ref 36.0–46.0)
Hemoglobin: 10.4 g/dL — ABNORMAL LOW (ref 12.0–15.0)
Immature Granulocytes: 0 %
Lymphocytes Relative: 7 %
Lymphs Abs: 0.6 K/uL — ABNORMAL LOW (ref 0.7–4.0)
MCH: 26.7 pg (ref 26.0–34.0)
MCHC: 32.7 g/dL (ref 30.0–36.0)
MCV: 81.5 fL (ref 80.0–100.0)
Monocytes Absolute: 0.9 K/uL (ref 0.1–1.0)
Monocytes Relative: 11 %
Neutro Abs: 6.6 K/uL (ref 1.7–7.7)
Neutrophils Relative %: 82 %
Platelets: 178 K/uL (ref 150–400)
RBC: 3.9 MIL/uL (ref 3.87–5.11)
RDW: 13.6 % (ref 11.5–15.5)
WBC: 8.1 K/uL (ref 4.0–10.5)
nRBC: 0 % (ref 0.0–0.2)

## 2024-07-30 LAB — RESP PANEL BY RT-PCR (RSV, FLU A&B, COVID)  RVPGX2
Influenza A by PCR: NEGATIVE
Influenza B by PCR: NEGATIVE
Resp Syncytial Virus by PCR: NEGATIVE
SARS Coronavirus 2 by RT PCR: NEGATIVE

## 2024-07-30 LAB — COMPREHENSIVE METABOLIC PANEL WITH GFR
ALT: 23 U/L (ref 0–44)
AST: 29 U/L (ref 15–41)
Albumin: 3.7 g/dL (ref 3.5–5.0)
Alkaline Phosphatase: 104 U/L (ref 38–126)
Anion gap: 12 (ref 5–15)
BUN: 25 mg/dL — ABNORMAL HIGH (ref 6–20)
CO2: 26 mmol/L (ref 22–32)
Calcium: 8.9 mg/dL (ref 8.9–10.3)
Chloride: 97 mmol/L — ABNORMAL LOW (ref 98–111)
Creatinine, Ser: 2.41 mg/dL — ABNORMAL HIGH (ref 0.44–1.00)
GFR, Estimated: 23 mL/min — ABNORMAL LOW
Glucose, Bld: 209 mg/dL — ABNORMAL HIGH (ref 70–99)
Potassium: 4.1 mmol/L (ref 3.5–5.1)
Sodium: 135 mmol/L (ref 135–145)
Total Bilirubin: 0.6 mg/dL (ref 0.0–1.2)
Total Protein: 6.5 g/dL (ref 6.5–8.1)

## 2024-07-30 LAB — PROTIME-INR
INR: 1 (ref 0.8–1.2)
Prothrombin Time: 13.3 s (ref 11.4–15.2)

## 2024-07-30 LAB — LACTIC ACID, PLASMA: Lactic Acid, Venous: 1.9 mmol/L (ref 0.5–1.9)

## 2024-07-30 MED ORDER — VANCOMYCIN HCL IN DEXTROSE 1-5 GM/200ML-% IV SOLN
1000.0000 mg | Freq: Once | INTRAVENOUS | Status: AC
  Administered 2024-07-30: 1000 mg via INTRAVENOUS
  Filled 2024-07-30: qty 200

## 2024-07-30 MED ORDER — LACTATED RINGERS IV SOLN: INTRAVENOUS | Status: AC

## 2024-07-30 MED ORDER — LACTATED RINGERS IV BOLUS (SEPSIS)
1000.0000 mL | Freq: Once | INTRAVENOUS | Status: AC
  Administered 2024-07-30: 1000 mL via INTRAVENOUS

## 2024-07-30 MED ORDER — LACTATED RINGERS IV BOLUS (SEPSIS)
250.0000 mL | Freq: Once | INTRAVENOUS | Status: AC
  Administered 2024-07-30: 250 mL via INTRAVENOUS

## 2024-07-30 MED ORDER — ACETAMINOPHEN 325 MG PO TABS
650.0000 mg | ORAL_TABLET | Freq: Four times a day (QID) | ORAL | Status: DC | PRN
  Administered 2024-07-30: 650 mg via ORAL
  Filled 2024-07-30: qty 2

## 2024-07-30 MED ORDER — SODIUM CHLORIDE 0.9 % IV SOLN
2.0000 g | Freq: Once | INTRAVENOUS | Status: AC
  Administered 2024-07-30: 2 g via INTRAVENOUS
  Filled 2024-07-30: qty 12.5

## 2024-07-30 MED ORDER — METRONIDAZOLE 500 MG/100ML IV SOLN
500.0000 mg | Freq: Once | INTRAVENOUS | Status: AC
  Administered 2024-07-30: 500 mg via INTRAVENOUS
  Filled 2024-07-30: qty 100

## 2024-07-30 MED ORDER — LACTATED RINGERS IV BOLUS (SEPSIS)
500.0000 mL | Freq: Once | INTRAVENOUS | Status: AC
  Administered 2024-07-30: 500 mL via INTRAVENOUS

## 2024-07-30 NOTE — ED Triage Notes (Addendum)
 Hx of multiple bouts of sepsis. Reports chills since last Thursday and sharp pain in her joints. Reports dizziness and weakness this morning. Temp of 103.8 PTA.

## 2024-07-30 NOTE — ED Notes (Signed)
 Pt requesting something to drink. Active NPO order in place. Awaiting MD response for diet order. Pt notified of NPO order

## 2024-07-30 NOTE — ED Notes (Signed)
 Pt ambulatory to bathroom without assistance. Pt given soda per request

## 2024-07-30 NOTE — Progress Notes (Signed)
 Plan of Care Note for accepted transfer   Patient: Judy Manning MRN: [id number]   DOA: 07/30/2024  Facility requesting transfer: [facility]. Requesting Provider: Glendia Jefferson, MD. Reason for transfer: Sepsis due to undetermined organism. Facility course:  Per Dr. Freddi:  HPI 58 year old female with a history of rheumatoid arthritis on immunosuppression as well as recurrent sepsis/bacteremia presents with fever and chills.  Originally her symptoms started on 12/25.  She has been having chills as well as low-grade evening fevers around 100.  However this morning she had a temperature of 103.  She has been having diffuse arthralgias which she is not sure if this is just a rheumatoid arthritis flare.  Typically does not get a high fever with her RA however.  She was feeling dizzy and weak today especially.  She been having bilateral shoulder pain which reminds her of the time when her liver cyst ruptured.  However she does not have any abdominal pain like she did at that time, which was severe.  She denies any other obvious source of fever including no cough, shortness of breath, urinary symptoms, rash.  No weakness in her extremities, but feels generally weak.  Plan of care: The patient is accepted for admission to [facility] unit, at [facility].  The patient received IV fluids, cefepime , metronidazole  and vancomycin .  Author: Alm Dorn Castor, MD 07/30/2024  Check [email or web address] for on-call coverage.  Nursing staff, Please call [facility] Admits & Consults System-Wide number on Amion as soon as patient's arrival, so appropriate admitting provider can evaluate the pt.

## 2024-07-30 NOTE — Sepsis Progress Note (Signed)
 Sepsis protocol monitored by eLink

## 2024-07-30 NOTE — ED Provider Notes (Signed)
 " [place] EMERGENCY DEPARTMENT AT [facility] Provider Note   CSN: [id number] Arrival date & time: 07/30/24  9442     Patient presents with: No chief complaint on file.   Judy Manning is a 58 y.o. female.   HPI 58 year old female with a history of rheumatoid arthritis on immunosuppression as well as recurrent sepsis/bacteremia presents with fever and chills.  Originally her symptoms started on 12/25.  She has been having chills as well as low-grade evening fevers around 100.  However this morning she had a temperature of 103.  She has been having diffuse arthralgias which she is not sure if this is just a rheumatoid arthritis flare.  Typically does not get a high fever with her RA however.  She was feeling dizzy and weak today especially.  She been having bilateral shoulder pain which reminds her of the time when her liver cyst ruptured.  However she does not have any abdominal pain like she did at that time, which was severe.  She denies any other obvious source of fever including no cough, shortness of breath, urinary symptoms, rash.  No weakness in her extremities, but feels generally weak.  Prior to Admission medications  Medication Sig Start Date End Date Taking? Authorizing Provider  clonazePAM  (KLONOPIN ) 0.5 MG tablet Take 0.25-0.5 mg by mouth 2 (two) times daily as needed. 07/23/24  Yes [provider]  acetaminophen  (TYLENOL ) 325 MG tablet Take 2 tablets (650 mg total) by mouth every 6 (six) hours as needed for mild pain (pain score 1-3) or fever (or Fever >/= 101). 03/31/24   Sebastian Toribio GAILS, MD  AIMOVIG 140 MG/ML SOAJ Inject 140 mg into the skin every 28 (twenty-eight) days. 04/15/22   [provider]  B Complex Vitamins (B COMPLEX-B12) TABS Take 1 tablet by mouth daily as needed (for deficiency).    [provider]  DULoxetine  (CYMBALTA ) 30 MG capsule Take 1 capsule (30 mg total) by mouth daily. 08/09/21   Patel, Donika K, DO   eletriptan  (RELPAX ) 40 MG tablet Take 40 mg by mouth daily as needed (for breakthrough migraines- may take an additional 40 mg once, if no relief after an hour - Max of 80 mg/24 hours). 02/16/22   [provider]  enalapril  (VASOTEC ) 2.5 MG tablet Take 2.5 mg by mouth daily. 11/22/23   [provider]  gabapentin  (NEURONTIN ) 300 MG capsule TAKE 1 CAPSULE BY MOUTH  TWICE DAILY 12/29/20   Hilts, Ozell, MD  hydroxychloroquine  (PLAQUENIL ) 200 MG tablet Take 1 tablet (200 mg total) by mouth daily. 10/03/22   Rice, Lonni ORN, MD  Multiple Vitamins-Minerals (MULTIVITAMIN GUMMIES ADULT) CHEW Chew 2 tablets by mouth 3 (three) times a week.    [provider]  predniSONE  (DELTASONE ) 5 MG tablet Take 2 tablets (10 mg total) by mouth daily with breakfast. 03/01/23   Rice, Lonni ORN, MD    Allergies: Imitrex  [sumatriptan ], Latex, Ibuprofen, Nitrofurantoin, and Oseltamivir    Review of Systems  Constitutional:  Positive for chills and fever.  HENT:  Negative for sore throat.   Respiratory:  Negative for cough and shortness of breath.   Gastrointestinal:  Negative for abdominal pain.  Genitourinary:  Negative for dysuria.  Musculoskeletal:  Positive for arthralgias.  Neurological:  Positive for headaches. Negative for weakness and numbness.    Updated Vital Signs BP (!) 114/48   Pulse 91   Temp 100 F (37.8 C) (Oral)   Resp (!) 24   SpO2 100%  Physical Exam Vitals and nursing note reviewed.  Constitutional:      General: She is not in acute distress.    Appearance: She is well-developed. She is not ill-appearing or diaphoretic.  HENT:     Head: Normocephalic and atraumatic.  Cardiovascular:     Rate and Rhythm: Normal rate and regular rhythm.     Heart sounds: Normal heart sounds.  Pulmonary:     Effort: Pulmonary effort is normal.     Breath sounds: Normal breath sounds.  Abdominal:     General: There is no distension.     Palpations: Abdomen is soft.      Tenderness: There is no abdominal tenderness. There is no right CVA tenderness or left CVA tenderness.  Musculoskeletal:     Cervical back: Normal range of motion. No rigidity.  Skin:    General: Skin is warm and dry.  Neurological:     Mental Status: She is alert.     (all labs ordered are listed, but only abnormal results are displayed) Labs Reviewed  COMPREHENSIVE METABOLIC PANEL WITH GFR - Abnormal; Notable for the following components:      Result Value   Chloride 97 (*)    Glucose, Bld 209 (*)    BUN 25 (*)    Creatinine, Ser 2.41 (*)    GFR, Estimated 23 (*)    All other components within normal limits  CBC WITH DIFFERENTIAL/PLATELET - Abnormal; Notable for the following components:   Hemoglobin 10.4 (*)    HCT 31.8 (*)    Lymphs Abs 0.6 (*)    All other components within normal limits  URINALYSIS, W/ REFLEX TO CULTURE (INFECTION SUSPECTED) - Abnormal; Notable for the following components:   Protein, ur TRACE (*)    Leukocytes,Ua SMALL (*)    Non Squamous Epithelial PRESENT (*)    Bacteria, UA RARE (*)    All other components within normal limits  RESP PANEL BY RT-PCR (RSV, FLU A&B, COVID)  RVPGX2  CULTURE, BLOOD (ROUTINE X 2)  CULTURE, BLOOD (ROUTINE X 2)  URINE CULTURE  LACTIC ACID, PLASMA  PROTIME-INR  LACTIC ACID, PLASMA    EKG: EKG Interpretation Date/Time:  Tuesday July 30 2024 07:27:06 EST Ventricular Rate:  96 PR Interval:  100 QRS Duration:  91 QT Interval:  329 QTC Calculation: 416 R Axis:   78  Text Interpretation: Sinus rhythm Short PR interval no acute ST/T changes Confirmed by Freddi Hamilton [id number]) on 07/30/2024 8:20:21 AM  Radiology: [facility] Chest Port 1 View Result Date: 07/30/2024 CLINICAL DATA:  Fever, chills EXAM: PORTABLE CHEST 1 VIEW COMPARISON:  March 27, 2024 FINDINGS: The heart size and mediastinal contours are within normal limits. Both lungs are clear. The visualized skeletal structures are unremarkable. IMPRESSION: No active  disease. Electronically Signed   By: Lynwood Landy Raddle M.D.   On: 07/30/2024 08:14     .Critical Care  Performed by: Freddi Hamilton, MD Authorized by: Freddi Hamilton, MD   Critical care provider statement:    Critical care time (minutes):  30   Critical care time was exclusive of:  Separately billable procedures and treating other patients   Critical care was necessary to treat or prevent imminent or life-threatening deterioration of the following conditions:  Sepsis and shock   Critical care was time spent personally by me on the following activities:  Development of treatment plan with patient or surrogate, discussions with consultants, evaluation of patient's response to treatment, examination of patient, ordering and review of  laboratory studies, ordering and review of radiographic studies, ordering and performing treatments and interventions, pulse oximetry, re-evaluation of patient's condition and review of old charts    Medications Ordered in the ED  lactated ringers  infusion (has no administration in time range)  acetaminophen  (TYLENOL ) tablet 650 mg (has no administration in time range)  lactated ringers  bolus 1,000 mL (0 mLs Intravenous Stopped 07/30/24 0902)    And  lactated ringers  bolus 500 mL (500 mLs Intravenous New Bag/Given 07/30/24 0825)    And  lactated ringers  bolus 250 mL (250 mLs Intravenous New Bag/Given 07/30/24 0834)  ceFEPIme  (MAXIPIME ) 2 g in sodium chloride  0.9 % 100 mL IVPB (0 g Intravenous Stopped 07/30/24 0902)  metroNIDAZOLE  (FLAGYL ) IVPB 500 mg (0 mg Intravenous Stopped 07/30/24 0925)  vancomycin  (VANCOCIN ) IVPB 1000 mg/200 mL premix (1,000 mg Intravenous New Bag/Given 07/30/24 0859)                                    Medical Decision Making Amount and/or Complexity of Data Reviewed External Data Reviewed: notes. Labs: ordered.    Details: Acute on chronic kidney injury Radiology: ordered and independent interpretation performed.    Details: No  pneumonia ECG/medicine tests: ordered and independent interpretation performed.    Details: No ischemia  Risk OTC drugs. Prescription drug management. Decision regarding hospitalization.   Patient presents with fevers at home.  She is hypotensive on arrival here though this quickly responded to fluids.  She does have a mild AKI.  This presentation is very similar to August, when she was discharged home at her request but had to come back due to bacteremia.  Given her immunosuppression and recurrent episodes, I am highly concerned about recurrent bacteremia.  While her white blood cell count and lactate are normal, she did arrive hypotensive with fevers at home.  She was put on broad IV antibiotics.  I think she will need admission and monitoring.  Her blood pressure is now stabilizing.  At this point there is no clear bacterial source.  While she has had prior cyst rupture from her liver, she is not endorsing any abdominal pain and her abdominal exam is benign.  I do not think CT is warranted at this time.  Blood cultures are pending.  Discussed with Dr. Celinda for admission.     Final diagnoses:  Sepsis with acute renal failure, due to unspecified organism, unspecified acute renal failure type, unspecified whether septic shock present [facility])    ED Discharge Orders     None          Freddi Hamilton, MD 07/30/24 1027  "

## 2024-07-30 NOTE — ED Notes (Signed)
 Pt up to void.

## 2024-07-30 NOTE — ED Notes (Signed)
 Have been pts rooms a lot comp,leting septic work up orders and fluids, pt aaox4 states has been feeling bad for a while and is always septic daughter in room with pt at first and then she leftt

## 2024-07-31 ENCOUNTER — Encounter (HOSPITAL_BASED_OUTPATIENT_CLINIC_OR_DEPARTMENT_OTHER): Payer: Self-pay | Admitting: Internal Medicine

## 2024-07-31 DIAGNOSIS — R6511 Systemic inflammatory response syndrome (SIRS) of non-infectious origin with acute organ dysfunction: Secondary | ICD-10-CM

## 2024-07-31 DIAGNOSIS — N179 Acute kidney failure, unspecified: Secondary | ICD-10-CM | POA: Diagnosis not present

## 2024-07-31 DIAGNOSIS — F32A Depression, unspecified: Secondary | ICD-10-CM

## 2024-07-31 DIAGNOSIS — N1832 Chronic kidney disease, stage 3b: Secondary | ICD-10-CM | POA: Diagnosis not present

## 2024-07-31 DIAGNOSIS — A419 Sepsis, unspecified organism: Secondary | ICD-10-CM | POA: Diagnosis not present

## 2024-07-31 LAB — RESPIRATORY PANEL BY PCR

## 2024-07-31 LAB — URINE CULTURE

## 2024-07-31 LAB — BASIC METABOLIC PANEL WITH GFR
Anion gap: 9 (ref 5–15)
BUN: 20 mg/dL (ref 6–20)
CO2: 27 mmol/L (ref 22–32)
Calcium: 9.1 mg/dL (ref 8.9–10.3)
Chloride: 104 mmol/L (ref 98–111)
Creatinine, Ser: 1.97 mg/dL — ABNORMAL HIGH (ref 0.44–1.00)
GFR, Estimated: 29 mL/min — ABNORMAL LOW
Glucose, Bld: 104 mg/dL — ABNORMAL HIGH (ref 70–99)
Potassium: 3.9 mmol/L (ref 3.5–5.1)
Sodium: 141 mmol/L (ref 135–145)

## 2024-07-31 MED ORDER — DULOXETINE HCL 20 MG PO CPEP: 40.0000 mg | ORAL_CAPSULE | Freq: Every day | ORAL | Status: DC

## 2024-07-31 MED ORDER — ACETAMINOPHEN 325 MG PO TABS: 650.0000 mg | ORAL_TABLET | Freq: Four times a day (QID) | ORAL | Status: DC | PRN

## 2024-07-31 MED ORDER — ONDANSETRON HCL 4 MG/2ML IJ SOLN: 4.0000 mg | Freq: Four times a day (QID) | INTRAMUSCULAR | Status: DC | PRN

## 2024-07-31 MED ORDER — SODIUM CHLORIDE 0.9 % IV SOLN
2.0000 g | INTRAVENOUS | Status: DC
  Administered 2024-07-31: 2 g via INTRAVENOUS
  Filled 2024-07-31: qty 12.5

## 2024-07-31 MED ORDER — DULOXETINE HCL 20 MG PO CPEP
40.0000 mg | ORAL_CAPSULE | Freq: Every day | ORAL | Status: DC
  Administered 2024-07-31 – 2024-08-02 (×3): 40 mg via ORAL
  Filled 2024-07-31 (×3): qty 2

## 2024-07-31 MED ORDER — ONDANSETRON HCL 4 MG PO TABS: 4.0000 mg | ORAL_TABLET | Freq: Four times a day (QID) | ORAL | Status: DC | PRN

## 2024-07-31 MED ORDER — VANCOMYCIN HCL IN DEXTROSE 1-5 GM/200ML-% IV SOLN
1000.0000 mg | INTRAVENOUS | Status: DC
  Administered 2024-08-01: 1000 mg via INTRAVENOUS
  Filled 2024-07-31: qty 200

## 2024-07-31 MED ORDER — PREDNISONE 5 MG PO TABS
5.0000 mg | ORAL_TABLET | Freq: Every day | ORAL | Status: DC
  Administered 2024-07-31 – 2024-08-02 (×3): 5 mg via ORAL
  Filled 2024-07-31 (×3): qty 1

## 2024-07-31 MED ORDER — HYDRALAZINE HCL 20 MG/ML IJ SOLN: 5.0000 mg | Freq: Four times a day (QID) | INTRAMUSCULAR | Status: DC | PRN

## 2024-07-31 MED ORDER — SODIUM CHLORIDE 0.9 % IV SOLN
2.0000 g | INTRAVENOUS | Status: DC
  Administered 2024-08-01 – 2024-08-02 (×2): 2 g via INTRAVENOUS
  Filled 2024-07-31 (×2): qty 20

## 2024-07-31 MED ORDER — CLONAZEPAM 0.5 MG PO TABS
0.5000 mg | ORAL_TABLET | Freq: Two times a day (BID) | ORAL | Status: DC | PRN
  Administered 2024-07-31 – 2024-08-01 (×2): 0.5 mg via ORAL
  Filled 2024-07-31 (×2): qty 1

## 2024-07-31 MED ORDER — HYDROXYCHLOROQUINE SULFATE 200 MG PO TABS: 200.0000 mg | ORAL_TABLET | Freq: Every day | ORAL | Status: DC

## 2024-07-31 MED ORDER — LACTATED RINGERS IV SOLN: INTRAVENOUS | Status: AC

## 2024-07-31 MED ORDER — HYDROXYCHLOROQUINE SULFATE 200 MG PO TABS
200.0000 mg | ORAL_TABLET | Freq: Every day | ORAL | Status: DC
  Administered 2024-07-31 – 2024-08-02 (×3): 200 mg via ORAL
  Filled 2024-07-31 (×3): qty 1

## 2024-07-31 MED ORDER — HEPARIN SODIUM (PORCINE) 5000 UNIT/ML IJ SOLN
5000.0000 [IU] | Freq: Three times a day (TID) | INTRAMUSCULAR | Status: DC
  Administered 2024-07-31 – 2024-08-02 (×5): 5000 [IU] via SUBCUTANEOUS
  Filled 2024-07-31 (×5): qty 1

## 2024-07-31 MED ORDER — GABAPENTIN 300 MG PO CAPS
300.0000 mg | ORAL_CAPSULE | Freq: Two times a day (BID) | ORAL | Status: DC
  Administered 2024-07-31 – 2024-08-02 (×5): 300 mg via ORAL
  Filled 2024-07-31 (×5): qty 1

## 2024-07-31 MED ORDER — ACETAMINOPHEN 650 MG RE SUPP: 650.0000 mg | Freq: Four times a day (QID) | RECTAL | Status: DC | PRN

## 2024-07-31 MED ORDER — METRONIDAZOLE 500 MG/100ML IV SOLN
500.0000 mg | Freq: Two times a day (BID) | INTRAVENOUS | Status: DC
  Administered 2024-07-31 – 2024-08-01 (×2): 500 mg via INTRAVENOUS
  Filled 2024-07-31 (×2): qty 100

## 2024-07-31 MED ORDER — METRONIDAZOLE 500 MG/100ML IV SOLN
500.0000 mg | Freq: Two times a day (BID) | INTRAVENOUS | Status: DC
  Administered 2024-07-31: 500 mg via INTRAVENOUS
  Filled 2024-07-31: qty 100

## 2024-07-31 MED ORDER — LOPERAMIDE HCL 2 MG PO CAPS
4.0000 mg | ORAL_CAPSULE | Freq: Once | ORAL | Status: AC
  Administered 2024-07-31: 4 mg via ORAL
  Filled 2024-07-31: qty 2

## 2024-07-31 NOTE — Progress Notes (Signed)
 No bill note  Patient admitted by my partner Dr. Sherre Documentation reviewed from EDP, Dr. Sherre, Dr. Overton. Patient seen in room, appears, comfortable.  Also discussed with nursing. Plan as outlined by Dr. Sherre and Dr. Overton.  Toribio Door, MD [facility]

## 2024-07-31 NOTE — H&P (Addendum)
 " History and Physical - Telemedicine  [name] FMW:[id number] DOB: [birth year] DOA: 07/30/2024  PCP: Hughie Sharper, MD  Patient coming from: home  Referring provider: Dr. Freddi, EDP Telemedicine provider: Dr. Sherre Patient location: [facility] at [facility] Referring diagnosis: SIRS with organ involvment Patient name and DOB verified: Patient was able to verify her first and last name: Judy Manning, date of birth: July 27, 1966. Patient consented to Telemedicine Evaluation: yes RN virtual assistant: Corean Cleveland, RN Video encounter time and date: 07/31/2024 and at approximately: 09:31a  Chief Concern: fevers + chills  HPI: Ms. Judy Manning is a 76 female with rheumatoid arthritis, CKD 3b, polycystic kidney disease, polycystic liver disease, history of klebsiella bacteremia in August 2025.  12/30: She presents to the ED for chief concerns of fever, chills.  At the time of my evaluation, vitals showed t 98.6, rr 21, hr 84, blood pressure 104/46, SpO2 100% on room air.  Serum sodium 135, potassium 4.1, chloride 97, bicarb 26, BUN of 25, Synchrony 2.41, eGFR 23, nonfasting blood glucose 209, WBC 8.1, hemoglobin 10.4, platelets of 178.  COVID/influenza A/influenza B/RSV PCR were negative.  UA was positive for small leukocytes.  Lactic acid 1.9.  12/31: Patient admitted to hospital service for chief concerns of meeting SIRS with organ involvement. ----------------------- At bedside, via telemedicine encounter, patient was able to confirm her first and last name, and date of birth.   She reports that she started feeling fever and chills on 12/30. She endorses t max of 103 at 4 am 12/30 early a.m.  She reports that she also noted some fever and chills on Thursday however this resolved and so she did not think anything of it and stayed home.  He also endorses generalized joint pain that is similar to prior episodes of rheumatoid arthritis flare.  She  denies nausea, vomiting, chest pain, shortness of breath, dysuria, hematuria, diarrhea, blood in her stool.  She denies known sick contacts.  Reports the symptoms feels similar to prior episodes of sepsis with bacteremia.  Social history: She lives at home on her own.  She recently widowed this past year as her husband just passed away.  She denies tobacco, EtOH, recreational drug use.  She is in between jobs and having difficulty getting the motivation due to her husband recently passing away this past year.  ROS: Constitutional: no weight change, + fever, + chills ENT/Mouth: no sore throat, no rhinorrhea Eyes: no eye pain, no vision changes Cardiovascular: no chest pain, no dyspnea,  no edema, no palpitations Respiratory: no cough, no sputum, no wheezing Gastrointestinal: no nausea, no vomiting, no diarrhea, no constipation Genitourinary: no urinary incontinence, no dysuria, no hematuria Musculoskeletal: no arthralgias, no myalgias Skin: no skin lesions, no pruritus, Neuro: + weakness, no loss of consciousness, no syncope Psych: no anxiety, no depression, + decrease appetite Heme/Lymph: no bruising, no bleeding  Assessment/Plan  Principal Problem:   Sepsis due to undetermined organism (HCC) Active Problems:   SIRS (systemic inflammatory response syndrome) (HCC)   AKI (acute kidney injury)   Seropositive rheumatoid arthritis (HCC)   Other hyperlipidemia   Depression   Polycystic kidney disease   Assessment and Plan:  * Sepsis due to undetermined organism (HCC) Blood cultures x2 are in process Continue with cefepime  and vancomycin  per pharmacy Metronidazole  500 mg IV BID Maintain MAP greater than 65  Update: ID changed cefepime  to ceftriaxine  AKI (acute kidney injury) On CKD 3b Continue with LR 150 mg, 20 hours  ordered Recheck BMP in the AM  SIRS (systemic inflammatory response syndrome) (HCC) Patient met SIRS With increased heart rate, respiration, source of  infection in the urine. Organ involvement is renal Blood cultures are in process  Seropositive rheumatoid arthritis (HCC) Home hydroxychloroquine  200 mg daily resumed  Depression Home duloxetine  40 mg daily resumed  Chart reviewed.   DVT prophylaxis: Heparin  5000 units subcutaneous every 8 hours Code Status: Full code Diet: Renal/carb modified, no fluid restriction Family Communication: A phone call was offered, patient declined stating that she will update her daughter. Disposition Plan: Pending clinical course Consults called: Pharmacy Admission status: Telemetry, observation  Past Medical History:  Diagnosis Date   COVID 12/2020   Detached retina    Liver cyst 05/03/2021   Liver, polycystic with compressive symptoms 05/03/2021   Polycystic kidney disease    Rheumatoid arthritis (HCC)    Sepsis (HCC)    Past Surgical History:  Procedure Laterality Date   ABDOMINAL HERNIA REPAIR     AUGMENTATION MAMMAPLASTY     CARPAL TUNNEL RELEASE Right    GANGLION CYST EXCISION Right    RETINAL TEAR REPAIR CRYOTHERAPY Bilateral    tendonitis surgery elbow     VITRECTOMY     Social History:  reports that she has never smoked. She has been exposed to tobacco smoke. She has never used smokeless tobacco. She reports that she does not currently use alcohol. She reports that she does not use drugs.  Allergies[1] Family History  Problem Relation Age of Onset   Rheum arthritis Mother    Heart disease Mother        Pacemaker   COPD Mother        Smoker   Skin cancer Mother    Emphysema Mother    Heart Problems Mother    Stroke Father    Polycystic kidney disease Father    Healthy Sister    Arthritis Brother    Healthy Brother    Alzheimer's disease Maternal Grandmother 44   Diabetes Paternal Grandfather    Skin cancer Paternal Grandfather    Cancer Paternal Grandfather    Healthy Daughter    Breast cancer Neg Hx    Colon cancer Neg Hx    Family history: Family history  reviewed and not pertinent.  Prior to Admission medications  Medication Sig Start Date End Date Taking? Authorizing Provider  acetaminophen  (TYLENOL ) 325 MG tablet Take 2 tablets (650 mg total) by mouth every 6 (six) hours as needed for mild pain (pain score 1-3) or fever (or Fever >/= 101). 03/31/24  Yes Sebastian Toribio GAILS, MD  B Complex Vitamins (B COMPLEX-B12) TABS Take 1 tablet by mouth daily as needed (for deficiency).   Yes [provider]  cetirizine (ZYRTEC) 10 MG tablet Take 10 mg by mouth daily.   Yes [provider]  Cholecalciferol 125 MCG (5000 UT) TABS Take 1 tablet by mouth daily.   Yes [provider]  clonazePAM  (KLONOPIN ) 0.5 MG tablet Take 0.25-0.5 mg by mouth 2 (two) times daily as needed. 07/23/24  Yes [provider]  DULoxetine  (CYMBALTA ) 30 MG capsule Take 1 capsule (30 mg total) by mouth daily. Patient taking differently: Take 40 mg by mouth daily. 08/09/21  Yes Patel, Donika K, DO  eletriptan  (RELPAX ) 40 MG tablet Take 40 mg by mouth daily as needed (for breakthrough migraines- may take an additional 40 mg once, if no relief after an hour - Max of 80 mg/24 hours). 02/16/22  Yes [provider]  enalapril  (VASOTEC ) 2.5 MG tablet Take 5 mg by mouth daily. 11/22/23  Yes [provider]  gabapentin  (NEURONTIN ) 300 MG capsule TAKE 1 CAPSULE BY MOUTH  TWICE DAILY 12/29/20  Yes Hilts, Ozell, MD  Galcanezumab-gnlm (EMGALITY) 120 MG/ML SOSY Inject 120 mg into the skin every 30 (thirty) days.   Yes [provider]  hydroxychloroquine  (PLAQUENIL ) 200 MG tablet Take 1 tablet (200 mg total) by mouth daily. 10/03/22  Yes Rice, Lonni ORN, MD  Multiple Vitamins-Minerals (MULTIVITAMIN GUMMIES ADULT) CHEW Chew 2 tablets by mouth 3 (three) times a week.   Yes [provider]  predniSONE  (DELTASONE ) 5 MG tablet Take 2 tablets (10 mg total) by mouth daily with breakfast. Patient taking differently: Take 5 mg by mouth daily  with breakfast. *increase to 10 mg when arthritis flares up* 03/01/23  Yes Rice, Lonni ORN, MD  AIMOVIG 140 MG/ML SOAJ Inject 140 mg into the skin every 28 (twenty-eight) days. Patient not taking: Reported on 07/30/2024 04/15/22   [provider]   Physical Exam completed with assistance of: Corean Cleveland, RN, who was at bedside during this portion of the virtual encounter:  Vitals:   07/31/24 1200 07/31/24 1300 07/31/24 1400 07/31/24 1559  BP: 104/68 131/65 120/68 137/60  Pulse: 95 88 81 96  Resp: (!) 23 18 16 20   Temp:    98.3 F (36.8 C)  TempSrc:    Oral  SpO2: 99% 100% 98% 99%  Weight:       Constitutional: appears age-appropriate, depressed, tearful Eyes: EOMI,  conjunctivae normal ENMT: Mucous membranes are moist. Hearing appropriate Neck: normal, supple, no masses, no thyromegaly Respiratory: clear to auscultation bilaterally, no wheezing. Normal respiratory effort. No accessory muscle use.  Cardiovascular: Regular rate and rhythm, no murmurs. Normal heart sounds. Abdomen: no tenderness. Bowel sounds positive.  Musculoskeletal: No joint deformity upper and lower extremities. Good ROM, no contractures, no atrophy. Skin: no rashes, ulcers on visible skin Neurologic: Strength is appropriate upper extremities.  Psychiatric: Normal judgment and insight. Alert and oriented x 3. Normal mood.   EKG: independently reviewed, showing sinus rhythm with rate of 96, QTc 416  Chest x-ray on Admission: I personally reviewed and I agree with radiologist reading as below.  DG Chest Port 1 View Result Date: 07/30/2024 CLINICAL DATA:  Fever, chills EXAM: PORTABLE CHEST 1 VIEW COMPARISON:  March 27, 2024 FINDINGS: The heart size and mediastinal contours are within normal limits. Both lungs are clear. The visualized skeletal structures are unremarkable. IMPRESSION: No active disease. Electronically Signed   By: Lynwood Landy Raddle M.D.   On: 07/30/2024 08:14   Labs on Admission: I  have personally reviewed following labs.  CBC: Recent Labs  Lab 07/30/24 0620  WBC 8.1  NEUTROABS 6.6  HGB 10.4*  HCT 31.8*  MCV 81.5  PLT 178   Basic Metabolic Panel: Recent Labs  Lab 07/30/24 0620 07/31/24 0926  NA 135 141  K 4.1 3.9  CL 97* 104  CO2 26 27  GLUCOSE 209* 104*  BUN 25* 20  CREATININE 2.41* 1.97*  CALCIUM 8.9 9.1   GFR: Estimated Creatinine Clearance: 22.9 mL/min (A) (by C-G formula based on SCr of 1.97 mg/dL (H)).  Liver Function Tests: Recent Labs  Lab 07/30/24 0620  AST 29  ALT 23  ALKPHOS 104  BILITOT 0.6  PROT 6.5  ALBUMIN 3.7   Coagulation Profile: Recent Labs  Lab 07/30/24 0620  INR 1.0   Urine analysis:  Component Value Date/Time   COLORURINE YELLOW 07/30/2024 0620   APPEARANCEUR CLEAR 07/30/2024 0620   LABSPEC 1.010 07/30/2024 0620   PHURINE 7.5 07/30/2024 0620   GLUCOSEU NEGATIVE 07/30/2024 0620   HGBUR NEGATIVE 07/30/2024 0620   BILIRUBINUR NEGATIVE 07/30/2024 0620   BILIRUBINUR small (A) 03/30/2021 1833   KETONESUR NEGATIVE 07/30/2024 0620   PROTEINUR TRACE (A) 07/30/2024 0620   UROBILINOGEN 1.0 03/30/2021 1833   UROBILINOGEN 0.2 08/31/2009 1605   NITRITE NEGATIVE 07/30/2024 0620   LEUKOCYTESUR SMALL (A) 07/30/2024 0620   This document was prepared using Dragon Voice Recognition software and may include unintentional dictation errors.  Dr. Sherre [facility] Location: [place]  If 7PM-7AM, please contact overnight-coverage provider If 7AM-7PM, please contact day attending provider [email or web address]  07/31/2024, 5:26 PM      [1]  Allergies Allergen Reactions   Imitrex  [Sumatriptan ] Swelling and Other (See Comments)    Throat gets tight   Latex Itching   Ibuprofen Other (See Comments)    Polycystic kidney disease   Nitrofurantoin Rash   Oseltamivir Nausea And Vomiting   "

## 2024-07-31 NOTE — Consult Note (Signed)
 "        [facility]    Date of Admission:  07/30/2024     Reason for Consult: sepsis    Referring Provider: Cox, Amy     Lines:  peripheral  Abx: 12/31-c vanc 12/31-c cefepime  12/31-c metronidazole         Assessment:  58 female with rheumatoid arthritis, CKD 3b, polycystic kidney disease, polycystic liver disease, history of klebsiella bacteremia in August 2025.   12/30: She presents to the ED for chief concerns of fever, chills.   12/30 bcx ngtd  Previous cultures: 03/27/24 bcx klebsiella pna (S cefazolin, amp/sulb, cipro , bactrim) 04/2021 liver cyst cx kleb pna (S cefazolin, amp/sulb, cipro , bactrim); bcx negative    At this time patient doesn't appear to behave like gram negative sepsis  I am exploring other causes of fever. I agree with [facility]ad that rheumatoid arthritis doesn't usually flare with fever. I do not appreciate peripheral small joints inflammation based on hx/exam  No abd pain and at this point unclear benefit abd pelv ct    Plan: Continue empiric vanc/ceftriaxone /flagyl  for now Respiratory viral pcr F/u bcx If current w/u negative will consider abd pelv imaging if ongoing sepsis Maintain droplet isolation precaution while getting viral pcr Discussed with primary team      ------------------------------------------------ Principal Problem:   Sepsis due to undetermined organism (HCC) Active Problems:   Polycystic kidney disease   Seropositive rheumatoid arthritis (HCC)   Other hyperlipidemia   SIRS (systemic inflammatory response syndrome) (HCC)   AKI (acute kidney injury)   Depression    HPI: Suly Vukelich is a 58 y.o. female rheumatoid arthritis, CKD 3b, polycystic kidney disease, polycystic liver disease, history of klebsiella bacteremia in August 2025.   12/30: She presents to the ED for chief concerns of fever, chills.   No flares of hands/ankles in terms of prolonged stiffness, swelling No  rash No headache No uri sx No uti sx No gastroenteritis sx  She does have arthralgia though  Endorsed bilateral shoulder pain as well as malaise  Patient admitted for w/u  Fever to 103 No leukocytosis Flu/rsv/covid negative Bcx in process bsAbx started   Not yet feels any difference    Family History  Problem Relation Age of Onset   Rheum arthritis Mother    Heart disease Mother        Pacemaker   COPD Mother        Smoker   Skin cancer Mother    Emphysema Mother    Heart Problems Mother    Stroke Father    Polycystic kidney disease Father    Healthy Sister    Arthritis Brother    Healthy Brother    Alzheimer's disease Maternal Grandmother 31   Diabetes Paternal Grandfather    Skin cancer Paternal Grandfather    Cancer Paternal Grandfather    Healthy Daughter    Breast cancer Neg Hx    Colon cancer Neg Hx     Social History[1]  Allergies[2]  Review of Systems: ROS All Other ROS was negative, except mentioned above   Past Medical History:  Diagnosis Date   COVID 12/2020   Detached retina    Liver cyst 05/03/2021   Liver, polycystic with compressive symptoms 05/03/2021   Polycystic kidney disease    Rheumatoid arthritis (HCC)    Sepsis (HCC)        Scheduled Meds:  DULoxetine   40 mg Oral Daily   gabapentin   300 mg Oral  BID   heparin   5,000 Units Subcutaneous Q8H   hydroxychloroquine   200 mg Oral Daily   predniSONE   5 mg Oral Q breakfast   Continuous Infusions:  ceFEPime  (MAXIPIME ) IV Stopped (07/31/24 0915)   lactated ringers  150 mL/hr at 07/31/24 1058   metronidazole      [START ON 08/01/2024] vancomycin      PRN Meds:.acetaminophen  **OR** acetaminophen , clonazePAM , hydrALAZINE, ondansetron  **OR** ondansetron  (ZOFRAN ) IV   OBJECTIVE: Blood pressure 137/60, pulse 96, temperature 98.3 F (36.8 C), temperature source Oral, resp. rate 20, weight 54 kg, SpO2 99%.  Physical Exam  General/constitutional: no distress, pleasant HEENT:  Normocephalic, PER, Conj Clear, EOMI, Oropharynx clear Neck supple CV: rrr no mrg Lungs: clear to auscultation, normal respiratory effort Abd: Soft, Nontender Ext: no edema Skin: No Rash Neuro: nonfocal MSK: no peripheral joint swelling/tenderness/warmth; back spines nontender    Lab Results Lab Results  Component Value Date   WBC 8.1 07/30/2024   HGB 10.4 (L) 07/30/2024   HCT 31.8 (L) 07/30/2024   MCV 81.5 07/30/2024   PLT 178 07/30/2024    Lab Results  Component Value Date   CREATININE 1.97 (H) 07/31/2024   BUN 20 07/31/2024   NA 141 07/31/2024   K 3.9 07/31/2024   CL 104 07/31/2024   CO2 27 07/31/2024    Lab Results  Component Value Date   ALT 23 07/30/2024   AST 29 07/30/2024   ALKPHOS 104 07/30/2024   BILITOT 0.6 07/30/2024      Microbiology: Recent Results (from the past 240 hours)  Culture, blood (Routine x 2)     Status: None (Preliminary result)   Collection Time: 07/30/24  6:15 AM   Specimen: BLOOD LEFT FOREARM  Result Value Ref Range Status   Specimen Description   Final    BLOOD LEFT FOREARM Performed at [facility], [address] [place], [place] [address]    Special Requests   Final    BOTTLES DRAWN AEROBIC AND ANAEROBIC Blood Culture results may not be optimal due to an inadequate volume of blood received in culture bottles Performed at [facility], [address] [place], [place] [address]    Culture   Final    NO GROWTH < 24 HOURS Performed at [facility], [address] [place], [place] [address]    Report Status PENDING  Incomplete  Urine Culture     Status: Abnormal   Collection Time: 07/30/24  6:20 AM   Specimen: Urine, Random  Result Value Ref Range Status   Specimen Description   Final    URINE, RANDOM Performed at [facility], [address], [place], [place] [address]    Special Requests   Final    NONE Reflexed from [id number] Performed at [facility], [address] [place], [place] [address]    Culture MULTIPLE SPECIES PRESENT, SUGGEST RECOLLECTION (A)  Final   Report Status 07/31/2024 FINAL  Final  Culture, blood (Routine x 2)     Status: None (Preliminary result)   Collection Time: 07/30/24  6:24 AM   Specimen: BLOOD RIGHT WRIST  Result Value Ref Range Status   Specimen Description   Final    BLOOD RIGHT WRIST Performed at [facility], [address], [place], [place] [address]    Special Requests   Final    BOTTLES DRAWN AEROBIC AND ANAEROBIC Blood Culture results may not be optimal due to an inadequate volume of blood received in culture bottles Performed  at [facility], [address], [place], [place] [address]    Culture   Final    NO GROWTH < 24 HOURS Performed at [facility], [address] [place], [place] [address]    Report Status PENDING  Incomplete  Resp panel by RT-PCR (RSV, Flu A&B, Covid) Anterior Nasal Swab     Status: None   Collection Time: 07/30/24  6:25 AM   Specimen: Anterior Nasal Swab  Result Value Ref Range Status   SARS Coronavirus 2 by RT PCR NEGATIVE NEGATIVE Final    Comment: (NOTE) SARS-CoV-2 target nucleic acids are NOT DETECTED.  The SARS-CoV-2 RNA is generally detectable in upper respiratory specimens during the acute phase of infection. The lowest concentration of SARS-CoV-2 viral copies this assay can detect is 138 copies/mL. A negative result does not preclude SARS-Cov-2 infection and should not be used as the sole basis for treatment or other patient management decisions. A negative result may occur with  improper specimen collection/handling, submission of specimen other than nasopharyngeal swab, presence of viral mutation(s) within the areas targeted by this assay, and inadequate number of viral copies(<138 copies/mL). A negative result must be combined with clinical observations, patient history, and  epidemiological information. The expected result is Negative.  Fact Sheet for Patients:  bloggercourse.com  Fact Sheet for Healthcare Providers:  seriousbroker.it  This test is no t yet approved or cleared by the [place]  FDA and  has been authorized for detection and/or diagnosis of SARS-CoV-2 by FDA under an Emergency Use Authorization (EUA). This EUA will remain  in effect (meaning this test can be used) for the duration of the COVID-19 declaration under Section 564(b)(1) of the Act, 21 U.S.C.section 360bbb-3(b)(1), unless the authorization is terminated  or revoked sooner.       Influenza A by PCR NEGATIVE NEGATIVE Final   Influenza B by PCR NEGATIVE NEGATIVE Final    Comment: (NOTE) The Xpert Xpress SARS-CoV-2/FLU/RSV plus assay is intended as an aid in the diagnosis of influenza from Nasopharyngeal swab specimens and should not be used as a sole basis for treatment. Nasal washings and aspirates are unacceptable for Xpert Xpress SARS-CoV-2/FLU/RSV testing.  Fact Sheet for Patients: bloggercourse.com  Fact Sheet for Healthcare Providers: seriousbroker.it  This test is not yet approved or cleared by the [place]  FDA and has been authorized for detection and/or diagnosis of SARS-CoV-2 by FDA under an Emergency Use Authorization (EUA). This EUA will remain in effect (meaning this test can be used) for the duration of the COVID-19 declaration under Section 564(b)(1) of the Act, 21 U.S.C. section 360bbb-3(b)(1), unless the authorization is terminated or revoked.     Resp Syncytial Virus by PCR NEGATIVE NEGATIVE Final    Comment: (NOTE) Fact Sheet for Patients: bloggercourse.com  Fact Sheet for Healthcare Providers: seriousbroker.it  This test is not yet approved or cleared by the [place]  FDA and has been  authorized for detection and/or diagnosis of SARS-CoV-2 by FDA under an Emergency Use Authorization (EUA). This EUA will remain in effect (meaning this test can be used) for the duration of the COVID-19 declaration under Section 564(b)(1) of the Act, 21 U.S.C. section 360bbb-3(b)(1), unless the authorization is terminated or revoked.  Performed at [facility], [address] [place], [place] [address]      Serology:    Imaging: If present, new imagings (plain films, ct scans, and mri) have been personally visualized and interpreted; radiology reports have been reviewed. Decision making incorporated  into the Impression / Recommendations.   07/30/24 cxr FINDINGS: The heart size and mediastinal contours are within normal limits. Both lungs are clear. The visualized skeletal structures are unremarkable.   IMPRESSION: No active disease.  ------ Previous admission 03/28/24 ct abd pelv 1. No signs of bowel obstruction or inflammation. 2. Polycystic liver disease with innumerable cysts, unchanged from prior exam. 3. Polycystic kidney disease with bilateral nephromegaly and innumerable cysts, including incompletely characterized hyperdense cysts. 4. Stable common bile duct dilatation (up to 1.1 cm) and mild intrahepatic biliary dilatation, without signs of calcified choledocholithiasis.  Constance ONEIDA Passer, MD [facility] [phone] pager    07/31/2024, 4:26 PM     [1]  Social History Tobacco Use   Smoking status: Never    Passive exposure: Past   Smokeless tobacco: Never  Vaping Use   Vaping status: Never Used  Substance Use Topics   Alcohol use: Not Currently    Comment: 1 yearly   Drug use: Never  [2]  Allergies Allergen Reactions   Imitrex  [Sumatriptan ] Swelling and Other (See Comments)    Throat gets tight   Latex Itching   Ibuprofen Other (See Comments)    Polycystic kidney disease    Nitrofurantoin Rash   Oseltamivir Nausea And Vomiting   "

## 2024-07-31 NOTE — Assessment & Plan Note (Addendum)
 Blood cultures x2 are in process Continue with cefepime  and vancomycin  per pharmacy Metronidazole  500 mg IV BID Maintain MAP greater than 65  Update: ID changed cefepime  to ceftriaxine

## 2024-07-31 NOTE — Progress Notes (Signed)
 Pharmacy Antibiotic Note  Judy Manning is a 58 y.o. female admitted on 07/30/2024 with sepsis. Pharmacy has been consulted for vancomycin  and cefepime  dosing. Pt is febrile but WBC is WNL. SCr is elevated but trending down and lactic acid is WNL.  Plan: Vancomycin  1g IV Q48H Cefepime  2g IV Q24H F/u renal fxn, C&S, clinical status and peak/trough at SS  Weight: 54 kg (119 lb)  Temp (24hrs), Avg:99.9 F (37.7 C), Min:98.2 F (36.8 C), Max:103.1 F (39.5 C)  Recent Labs  Lab 07/30/24 0620  WBC 8.1  CREATININE 2.41*  LATICACIDVEN 1.9    Estimated Creatinine Clearance: 18.7 mL/min (A) (by C-G formula based on SCr of 2.41 mg/dL (H)).    Allergies[1]  Antimicrobials this admission: Vanc 12/30>> Cefepime  12/30>> Flagyl  12/30>>  Dose adjustments this admission: N/A  Microbiology results: Pending  Thank you for allowing pharmacy to be a part of this patients care.  Valery Chance, Vernell Helling 07/31/2024 10:17 AM     [1]  Allergies Allergen Reactions   Imitrex  [Sumatriptan ] Swelling and Other (See Comments)    Throat gets tight   Latex Itching   Ibuprofen Other (See Comments)    Polycystic kidney disease   Nitrofurantoin Rash   Oseltamivir Nausea And Vomiting

## 2024-07-31 NOTE — Assessment & Plan Note (Addendum)
 Patient met SIRS With increased heart rate, respiration, source of infection in the urine. Organ involvement is renal Blood cultures are in process

## 2024-07-31 NOTE — Assessment & Plan Note (Signed)
Home hydroxychloroquine 200 mg daily resumed

## 2024-07-31 NOTE — Assessment & Plan Note (Signed)
 Home duloxetine  40 mg daily resumed

## 2024-07-31 NOTE — Hospital Course (Addendum)
 Ms. Judy Manning is a 43 female with rheumatoid arthritis, CKD 3b, polycystic kidney disease, polycystic liver disease, history of klebsiella bacteremia in August 2025.  12/30: She presents to the ED for chief concerns of fever, chills.  At the time of my evaluation, vitals showed t 98.6, rr 21, hr 84, blood pressure 104/46, SpO2 100% on room air.  Serum sodium 135, potassium 4.1, chloride 97, bicarb 26, BUN of 25, Synchrony 2.41, eGFR 23, nonfasting blood glucose 209, WBC 8.1, hemoglobin 10.4, platelets of 178.  COVID/influenza A/influenza B/RSV PCR were negative.  UA was positive for small leukocytes.  Lactic acid 1.9.  12/31: Patient admitted to hospital service for chief concerns of meeting SIRS with organ involvement.

## 2024-07-31 NOTE — Assessment & Plan Note (Addendum)
 On CKD 3b Continue with LR 150 mg, 20 hours ordered Recheck BMP in the AM

## 2024-08-01 DIAGNOSIS — A419 Sepsis, unspecified organism: Secondary | ICD-10-CM

## 2024-08-01 DIAGNOSIS — Q613 Polycystic kidney, unspecified: Secondary | ICD-10-CM

## 2024-08-01 DIAGNOSIS — D696 Thrombocytopenia, unspecified: Secondary | ICD-10-CM | POA: Diagnosis not present

## 2024-08-01 DIAGNOSIS — R6511 Systemic inflammatory response syndrome (SIRS) of non-infectious origin with acute organ dysfunction: Secondary | ICD-10-CM | POA: Diagnosis not present

## 2024-08-01 DIAGNOSIS — Z91040 Latex allergy status: Secondary | ICD-10-CM | POA: Diagnosis not present

## 2024-08-01 DIAGNOSIS — N1832 Chronic kidney disease, stage 3b: Secondary | ICD-10-CM | POA: Diagnosis not present

## 2024-08-01 DIAGNOSIS — N179 Acute kidney failure, unspecified: Secondary | ICD-10-CM | POA: Diagnosis not present

## 2024-08-01 DIAGNOSIS — M069 Rheumatoid arthritis, unspecified: Secondary | ICD-10-CM

## 2024-08-01 DIAGNOSIS — Z79899 Other long term (current) drug therapy: Secondary | ICD-10-CM | POA: Diagnosis not present

## 2024-08-01 DIAGNOSIS — F32A Depression, unspecified: Secondary | ICD-10-CM | POA: Diagnosis not present

## 2024-08-01 DIAGNOSIS — N183 Chronic kidney disease, stage 3 unspecified: Secondary | ICD-10-CM | POA: Insufficient documentation

## 2024-08-01 DIAGNOSIS — Z7722 Contact with and (suspected) exposure to environmental tobacco smoke (acute) (chronic): Secondary | ICD-10-CM | POA: Diagnosis not present

## 2024-08-01 DIAGNOSIS — E7849 Other hyperlipidemia: Secondary | ICD-10-CM | POA: Diagnosis not present

## 2024-08-01 DIAGNOSIS — M059 Rheumatoid arthritis with rheumatoid factor, unspecified: Secondary | ICD-10-CM | POA: Diagnosis not present

## 2024-08-01 DIAGNOSIS — R509 Fever, unspecified: Secondary | ICD-10-CM | POA: Diagnosis present

## 2024-08-01 LAB — BASIC METABOLIC PANEL WITH GFR
Anion gap: 10 (ref 5–15)
BUN: 22 mg/dL — ABNORMAL HIGH (ref 6–20)
CO2: 27 mmol/L (ref 22–32)
Calcium: 8.9 mg/dL (ref 8.9–10.3)
Chloride: 103 mmol/L (ref 98–111)
Creatinine, Ser: 1.77 mg/dL — ABNORMAL HIGH (ref 0.44–1.00)
GFR, Estimated: 33 mL/min — ABNORMAL LOW
Glucose, Bld: 79 mg/dL (ref 70–99)
Potassium: 4.3 mmol/L (ref 3.5–5.1)
Sodium: 140 mmol/L (ref 135–145)

## 2024-08-01 LAB — CBC
HCT: 28.8 % — ABNORMAL LOW (ref 36.0–46.0)
Hemoglobin: 9.3 g/dL — ABNORMAL LOW (ref 12.0–15.0)
MCH: 26.3 pg (ref 26.0–34.0)
MCHC: 32.3 g/dL (ref 30.0–36.0)
MCV: 81.6 fL (ref 80.0–100.0)
Platelets: 122 K/uL — ABNORMAL LOW (ref 150–400)
RBC: 3.53 MIL/uL — ABNORMAL LOW (ref 3.87–5.11)
RDW: 13.6 % (ref 11.5–15.5)
WBC: 5.2 K/uL (ref 4.0–10.5)
nRBC: 0 % (ref 0.0–0.2)

## 2024-08-01 NOTE — Progress Notes (Signed)
" °   08/01/24 1357  TOC Brief Assessment  Insurance and Status Reviewed  Patient has primary care physician Yes  Home environment has been reviewed Resides alone in single family home  Prior level of function: Independent with ADLs at baseline  Prior/Current Home Services No current home services  Social Drivers of Health Review SDOH reviewed no interventions necessary  Readmission risk has been reviewed Yes  Transition of care needs no transition of care needs at this time    "

## 2024-08-01 NOTE — Progress Notes (Signed)
" °  Progress Note   Patient: Judy Manning FMW:[id number] DOB: 1966/06/23 DOA: 07/30/2024     0 DOS: the patient was seen and examined on 08/01/2024   Brief hospital course: 59 year old woman PMH rheumatoid arthritis, CKD, polycystic kidney disease, polycystic liver disease, Klebsiella bacteremia August 2025, presented with fever and chills.  Consultants ID  Procedures/Events 12/31 admit for sepsis, fever  Assessment and Plan: Sepsis Source unclear.  Influenza, RSV, RVP, COVID-negative.  Chest x-ray no acute disease No leukocytosis.  Now afebrile. Continue antibiotics as per infectious disease.  Follow culture data.  Urine culture unrevealing.  Blood cultures pending.  AKI CKD stage IIIb Baseline creatinine around 1.5-1.9, admission creatinine 2.41 Appears to be back to baseline with creatinine 1.77 today  Rheumatoid arthritis Holding hydroxychloroquine   Thrombocytopenia Possibly secondary to infection Repeat CBC in a.m.  Depression Stable.  Continue duloxetine   Brief summary: Admitted 2025 for sepsis of unknown origin with history of liver abscesses, workup was unrevealing, treated empirically with antibiotics. Admitted August 2025 for Klebsiella pneumonia and Enterobacter bacteremia thought to be GI related initially, CT abdomen pelvis showed polycystic liver and kidney disease, MRCP no acute findings, was discharged home on antibiotics Seen in ID clinic September 2025 for follow-up.  Noted to have a history in 2022 of infected liver cyst with Klebsiella pneumoniae requiring hospitalization.  Assessment and plan at that time was recurrent Klebsiella pneumonia bacteremia without clear source, with previous imaging CT and MRI negative for acute infection and liver cyst.  Differential included undetected hepatic cyst infection.  Plan for monitoring.    Subjective:  Feels better today, no fever overnight, hands have returned to normal, no pain, no liver pain  Physical  Exam: Vitals:   07/31/24 1559 07/31/24 2014 07/31/24 2342 08/01/24 0507  BP: 137/60 134/70 115/64 (!) 106/42  Pulse: 96 80 77 60  Resp: 20 15 15 16   Temp: 98.3 F (36.8 C) 98.2 F (36.8 C) 98.6 F (37 C) 99 F (37.2 C)  TempSrc: Oral Oral Oral Oral  SpO2: 99% 97% 95% 98%  Weight:       Physical Exam Vitals reviewed.  Constitutional:      General: She is not in acute distress.    Appearance: She is not ill-appearing or toxic-appearing.  Cardiovascular:     Rate and Rhythm: Normal rate and regular rhythm.     Heart sounds: No murmur heard. Pulmonary:     Effort: Pulmonary effort is normal. No respiratory distress.     Breath sounds: No wheezing, rhonchi or rales.  Abdominal:     General: There is no distension.     Palpations: Abdomen is soft.     Tenderness: There is no abdominal tenderness.  Neurological:     Mental Status: She is alert.  Psychiatric:        Mood and Affect: Mood normal.        Behavior: Behavior normal.     Data Reviewed: Creatinine improved, 1.77 Hemoglobin stable 9.3 Platelets low at 122  Family Communication: None present are requested  Disposition: Status is: Observation      Time spent: 20 minutes  Author: Toribio Door, MD 08/01/2024 11:34 AM  For on call review [email or web address].    "

## 2024-08-01 NOTE — Progress Notes (Signed)
 "        [facility]  Date of Admission:  07/30/2024      Lines:  peripheral   Abx: 1/1-c ceftriaxone   12/31-1/1 vanc 12/31 cefepime  12/31-1/1 metronidazole                                                                Assessment:  30 female with rheumatoid arthritis, CKD 3b, polycystic kidney disease, polycystic liver disease, history of klebsiella bacteremia in August 2025.   12/30: She presents to the ED for chief concerns of fever, chills.    12/30 bcx ngtd   Previous cultures: 03/27/24 bcx klebsiella pna (S cefazolin, amp/sulb, cipro , bactrim) 04/2021 liver cyst cx kleb pna (S cefazolin, amp/sulb, cipro , bactrim); bcx negative       At this time patient doesn't appear to behave like gram negative sepsis   I am exploring other causes of fever. I agree with triad that rheumatoid arthritis doesn't usually flare with fever. I do not appreciate peripheral small joints inflammation based on hx/exam   No abd pain and at this point unclear benefit abd pelv ct    -------------- 08/01/24 id assessment Polyarthralgia significantly better Rvp pcr negative No specific complaint today -- felt well No gi sx Bcx remains negative   Plan: Will deescalate to ceftriaxone  alone F/u bcx -- if tomorrow 08/02/24 remains negative by noon (when micro update cx) can  discharge with pcp f/u in 1-2 weeks  Dc droplet precaution and can do standard isolation precaution Discussed with primary team   Principal Problem:   Sepsis due to undetermined organism [facility]) Active Problems:   Polycystic kidney disease   Seropositive rheumatoid arthritis (HCC)   Other hyperlipidemia   SIRS (systemic inflammatory response syndrome) (HCC)   AKI (acute kidney injury)   Depression   Chronic kidney disease (CKD), stage III (moderate) (HCC)   Allergies[1]  Scheduled Meds:  DULoxetine   40 mg Oral Daily   gabapentin   300 mg Oral BID   heparin   5,000 Units Subcutaneous Q8H    hydroxychloroquine   200 mg Oral Daily   predniSONE   5 mg Oral Q breakfast   Continuous Infusions:  cefTRIAXone  (ROCEPHIN )  IV     metronidazole  500 mg (08/01/24 1110)   vancomycin  1,000 mg (08/01/24 1314)   PRN Meds:.acetaminophen  **OR** acetaminophen , clonazePAM , hydrALAZINE, ondansetron  **OR** ondansetron  (ZOFRAN ) IV   SUBJECTIVE: Feels well Eats well No fever No complaint  Review of Systems: ROS All other ROS was negative, except mentioned above     OBJECTIVE: Vitals:   07/31/24 1559 07/31/24 2014 07/31/24 2342 08/01/24 0507  BP: 137/60 134/70 115/64 (!) 106/42  Pulse: 96 80 77 60  Resp: 20 15 15 16   Temp: 98.3 F (36.8 C) 98.2 F (36.8 C) 98.6 F (37 C) 99 F (37.2 C)  TempSrc: Oral Oral Oral Oral  SpO2: 99% 97% 95% 98%  Weight:       Body mass index is 23.24 kg/m.  Physical Exam General/constitutional: no distress, pleasant HEENT: Normocephalic, PER, Conj Clear, EOMI, Oropharynx clear Neck supple CV: rrr no mrg Lungs: clear to auscultation, normal respiratory effort Abd: Soft, Nontender Ext: no edema Skin: No Rash Neuro: nonfocal MSK: no peripheral joint swelling/tenderness/warmth; back spines nontender    Lab Results  Lab Results  Component Value Date   WBC 5.2 08/01/2024   HGB 9.3 (L) 08/01/2024   HCT 28.8 (L) 08/01/2024   MCV 81.6 08/01/2024   PLT 122 (L) 08/01/2024    Lab Results  Component Value Date   CREATININE 1.77 (H) 08/01/2024   BUN 22 (H) 08/01/2024   NA 140 08/01/2024   K 4.3 08/01/2024   CL 103 08/01/2024   CO2 27 08/01/2024    Lab Results  Component Value Date   ALT 23 07/30/2024   AST 29 07/30/2024   ALKPHOS 104 07/30/2024   BILITOT 0.6 07/30/2024      Microbiology: Recent Results (from the past 240 hours)  Culture, blood (Routine x 2)     Status: None (Preliminary result)   Collection Time: 07/30/24  6:15 AM   Specimen: BLOOD LEFT FOREARM  Result Value Ref Range Status   Specimen Description   Final     BLOOD LEFT FOREARM Performed at [facility], [address], [place], [place] [address]    Special Requests   Final    BOTTLES DRAWN AEROBIC AND ANAEROBIC Blood Culture results may not be optimal due to an inadequate volume of blood received in culture bottles Performed at [facility], [address], [place], [place] [address]    Culture   Final    NO GROWTH 2 DAYS Performed at [facility], [address] [place], [place] [address]    Report Status PENDING  Incomplete  Urine Culture     Status: Abnormal   Collection Time: 07/30/24  6:20 AM   Specimen: Urine, Random  Result Value Ref Range Status   Specimen Description   Final    URINE, RANDOM Performed at [facility], [address] [place], [place] [address]    Special Requests   Final    NONE Reflexed from [id number] Performed at [facility], [address] [place], [place] [address]    Culture MULTIPLE SPECIES PRESENT, SUGGEST RECOLLECTION (A)  Final   Report Status 07/31/2024 FINAL  Final  Culture, blood (Routine x 2)     Status: None (Preliminary result)   Collection Time: 07/30/24  6:24 AM   Specimen: BLOOD RIGHT WRIST  Result Value Ref Range Status   Specimen Description   Final    BLOOD RIGHT WRIST Performed at [facility], [address] [place], [place] [address]    Special Requests   Final    BOTTLES DRAWN AEROBIC AND ANAEROBIC Blood Culture results may not be optimal due to an inadequate volume of blood received in culture bottles Performed at [facility], [address] [place], [place] [address]    Culture   Final    NO GROWTH 2 DAYS Performed at [facility], [address] [place], [place] [address]    Report Status PENDING  Incomplete  Resp panel by RT-PCR (RSV, Flu A&B, Covid) Anterior Nasal Swab     Status: None   Collection Time: 07/30/24  6:25 AM   Specimen:  Anterior Nasal Swab  Result Value Ref Range Status   SARS Coronavirus 2 by RT PCR NEGATIVE NEGATIVE Final    Comment: (NOTE) SARS-CoV-2 target nucleic acids are NOT DETECTED.  The SARS-CoV-2 RNA is generally detectable in upper respiratory specimens during the acute phase of infection. The lowest concentration of SARS-CoV-2 viral copies this assay can detect is 138 copies/mL. A negative result does not preclude SARS-Cov-2 infection and should  not be used as the sole basis for treatment or other patient management decisions. A negative result may occur with  improper specimen collection/handling, submission of specimen other than nasopharyngeal swab, presence of viral mutation(s) within the areas targeted by this assay, and inadequate number of viral copies(<138 copies/mL). A negative result must be combined with clinical observations, patient history, and epidemiological information. The expected result is Negative.  Fact Sheet for Patients:  bloggercourse.com  Fact Sheet for Healthcare Providers:  seriousbroker.it  This test is no t yet approved or cleared by the [place]  FDA and  has been authorized for detection and/or diagnosis of SARS-CoV-2 by FDA under an Emergency Use Authorization (EUA). This EUA will remain  in effect (meaning this test can be used) for the duration of the COVID-19 declaration under Section 564(b)(1) of the Act, 21 U.S.C.section 360bbb-3(b)(1), unless the authorization is terminated  or revoked sooner.       Influenza A by PCR NEGATIVE NEGATIVE Final   Influenza B by PCR NEGATIVE NEGATIVE Final    Comment: (NOTE) The Xpert Xpress SARS-CoV-2/FLU/RSV plus assay is intended as an aid in the diagnosis of influenza from Nasopharyngeal swab specimens and should not be used as a sole basis for treatment. Nasal washings and aspirates are unacceptable for Xpert Xpress SARS-CoV-2/FLU/RSV testing.  Fact  Sheet for Patients: bloggercourse.com  Fact Sheet for Healthcare Providers: seriousbroker.it  This test is not yet approved or cleared by the [place]  FDA and has been authorized for detection and/or diagnosis of SARS-CoV-2 by FDA under an Emergency Use Authorization (EUA). This EUA will remain in effect (meaning this test can be used) for the duration of the COVID-19 declaration under Section 564(b)(1) of the Act, 21 U.S.C. section 360bbb-3(b)(1), unless the authorization is terminated or revoked.     Resp Syncytial Virus by PCR NEGATIVE NEGATIVE Final    Comment: (NOTE) Fact Sheet for Patients: bloggercourse.com  Fact Sheet for Healthcare Providers: seriousbroker.it  This test is not yet approved or cleared by the [place]  FDA and has been authorized for detection and/or diagnosis of SARS-CoV-2 by FDA under an Emergency Use Authorization (EUA). This EUA will remain in effect (meaning this test can be used) for the duration of the COVID-19 declaration under Section 564(b)(1) of the Act, 21 U.S.C. section 360bbb-3(b)(1), unless the authorization is terminated or revoked.  Performed at [facility], [address] [place], [place] [address]   Respiratory (~20 pathogens) panel by PCR     Status: None   Collection Time: 07/31/24  5:02 PM   Specimen: Nasopharyngeal Swab; Respiratory  Result Value Ref Range Status   Adenovirus NOT DETECTED NOT DETECTED Final   Coronavirus 229E NOT DETECTED NOT DETECTED Final    Comment: (NOTE) The Coronavirus on the Respiratory Panel, DOES NOT test for the novel  Coronavirus (2019 nCoV)    Coronavirus HKU1 NOT DETECTED NOT DETECTED Final   Coronavirus NL63 NOT DETECTED NOT DETECTED Final   Coronavirus OC43 NOT DETECTED NOT DETECTED Final   Metapneumovirus NOT DETECTED NOT DETECTED Final   Rhinovirus / Enterovirus  NOT DETECTED NOT DETECTED Final   Influenza A NOT DETECTED NOT DETECTED Final   Influenza B NOT DETECTED NOT DETECTED Final   Parainfluenza Virus 1 NOT DETECTED NOT DETECTED Final   Parainfluenza Virus 2 NOT DETECTED NOT DETECTED Final   Parainfluenza Virus 3 NOT DETECTED NOT DETECTED Final   Parainfluenza Virus 4 NOT DETECTED NOT DETECTED Final   Respiratory Syncytial Virus NOT DETECTED  NOT DETECTED Final   Bordetella pertussis NOT DETECTED NOT DETECTED Final   Bordetella Parapertussis NOT DETECTED NOT DETECTED Final   Chlamydophila pneumoniae NOT DETECTED NOT DETECTED Final   Mycoplasma pneumoniae NOT DETECTED NOT DETECTED Final    Comment: Performed at [facility], [address] [place], [place] [address]     Serology:   Imaging: If present, new imagings (plain films, ct scans, and mri) have been personally visualized and interpreted; radiology reports have been reviewed. Decision making incorporated into the Impression / Recommendations.   Constance ONEIDA Passer, MD [facility] [phone] pager    08/01/2024, 1:36 PM     [1]  Allergies Allergen Reactions   Imitrex  [Sumatriptan ] Swelling and Other (See Comments)    Throat gets tight   Latex Itching   Ibuprofen Other (See Comments)    Polycystic kidney disease   Nitrofurantoin Rash   Oseltamivir Nausea And Vomiting   "

## 2024-08-02 DIAGNOSIS — R509 Fever, unspecified: Secondary | ICD-10-CM

## 2024-08-02 DIAGNOSIS — N179 Acute kidney failure, unspecified: Secondary | ICD-10-CM | POA: Diagnosis not present

## 2024-08-02 DIAGNOSIS — A419 Sepsis, unspecified organism: Secondary | ICD-10-CM | POA: Diagnosis not present

## 2024-08-02 DIAGNOSIS — N1832 Chronic kidney disease, stage 3b: Secondary | ICD-10-CM | POA: Diagnosis not present

## 2024-08-02 LAB — CBC
HCT: 30.4 % — ABNORMAL LOW (ref 36.0–46.0)
Hemoglobin: 9.8 g/dL — ABNORMAL LOW (ref 12.0–15.0)
MCH: 26.4 pg (ref 26.0–34.0)
MCHC: 32.2 g/dL (ref 30.0–36.0)
MCV: 81.9 fL (ref 80.0–100.0)
Platelets: 165 K/uL (ref 150–400)
RBC: 3.71 MIL/uL — ABNORMAL LOW (ref 3.87–5.11)
RDW: 13.4 % (ref 11.5–15.5)
WBC: 5.5 K/uL (ref 4.0–10.5)
nRBC: 0 % (ref 0.0–0.2)

## 2024-08-02 NOTE — Discharge Summary (Signed)
 " Physician Discharge Summary   Patient: Judy Manning MRN: [id number] DOB: 1966/02/08  Admit date:     07/30/2024  Discharge date: 08/02/2024  Discharge Physician: Judy Manning   PCP: Judy Sharper, MD   Recommendations at discharge:    Discharge Diagnoses: Principal Problem: Fever Active Problems:   SIRS (systemic inflammatory response syndrome) (HCC)   AKI (acute kidney injury)   Seropositive rheumatoid arthritis (HCC)   Other hyperlipidemia   Depression   Polycystic kidney disease   Chronic kidney disease (CKD), stage III (moderate) (HCC)  Resolved Problems:   * No resolved hospital problems. *  Hospital Course: 59 year old woman PMH rheumatoid arthritis, CKD, polycystic kidney disease, polycystic liver disease, Klebsiella bacteremia August 2025, presented with fever and chills.  Treated with empiric antibiotics.  Culture data unrevealing.  Seen by ID with recommendation to stop antibiotics 1/2 given negative culture data and discharged home.  Consultants ID  Procedures/Events 12/31 admit for sepsis, fever  Fever Sepsis ruled out at this time Source unclear.  Influenza, RSV, RVP, COVID-negative.  Chest x-ray no acute disease No leukocytosis.  Afebrile. Culture data unrevealing thus far.  Stop antibiotics and discharged home as per ID.   AKI CKD stage IIIb Baseline creatinine around 1.5-1.9, admission creatinine 2.41 Appears to be back to baseline with creatinine 1.77 today   Rheumatoid arthritis Can resume hydroxychloroquine    Thrombocytopenia Etiology unclear Resolved    Depression Stable.  Continue duloxetine   Disposition: Home Diet recommendation:  Regular diet DISCHARGE MEDICATION: Allergies as of 08/02/2024       Reactions   Imitrex  [sumatriptan ] Swelling, Other (See Comments)   Throat gets tight   Latex Itching   Ibuprofen Other (See Comments)   Polycystic kidney disease   Nitrofurantoin Rash   Oseltamivir Nausea And Vomiting         Medication List     STOP taking these medications    Aimovig 140 MG/ML Soaj Generic drug: Erenumab-aooe       TAKE these medications    acetaminophen  325 MG tablet Commonly known as: TYLENOL  Take 2 tablets (650 mg total) by mouth every 6 (six) hours as needed for mild pain (pain score 1-3) or fever (or Fever >/= 101).   B Complex-B12 Tabs Take 1 tablet by mouth daily as needed (for deficiency).   cetirizine 10 MG tablet Commonly known as: ZYRTEC Take 10 mg by mouth daily.   Cholecalciferol 125 MCG (5000 UT) Tabs Take 1 tablet by mouth daily.   clonazePAM  0.5 MG tablet Commonly known as: KLONOPIN  Take 0.25-0.5 mg by mouth 2 (two) times daily as needed.   DULoxetine  30 MG capsule Commonly known as: Cymbalta  Take 1 capsule (30 mg total) by mouth daily. What changed: how much to take   eletriptan  40 MG tablet Commonly known as: RELPAX  Take 40 mg by mouth daily as needed (for breakthrough migraines- may take an additional 40 mg once, if no relief after an hour - Max of 80 mg/24 hours).   Emgality 120 MG/ML Sosy Generic drug: Galcanezumab-gnlm Inject 120 mg into the skin every 30 (thirty) days.   enalapril  2.5 MG tablet Commonly known as: VASOTEC  Take 5 mg by mouth daily.   gabapentin  300 MG capsule Commonly known as: NEURONTIN  TAKE 1 CAPSULE BY MOUTH  TWICE DAILY   hydroxychloroquine  200 MG tablet Commonly known as: PLAQUENIL  Take 1 tablet (200 mg total) by mouth daily.   Multivitamin Gummies Adult Chew Chew 2 tablets by mouth 3 (three) times a week.  predniSONE  5 MG tablet Commonly known as: DELTASONE  Take 2 tablets (10 mg total) by mouth daily with breakfast. What changed:  how much to take additional instructions        Follow-up Information     Manning, Michael, MD. Schedule an appointment as soon as possible for a visit in 1 week(s).   Specialty: Family Medicine Contact information: [address] [place]  [place]  [address] [phone]                Discharge Exam: Judy Manning   07/31/24 0740  Weight: 54 kg   Physical Exam Vitals reviewed.  Constitutional:      General: She is not in acute distress.    Appearance: She is not ill-appearing or toxic-appearing.  Cardiovascular:     Rate and Rhythm: Normal rate and regular rhythm.     Heart sounds: No murmur heard. Pulmonary:     Effort: Pulmonary effort is normal. No respiratory distress.     Breath sounds: No wheezing, rhonchi or rales.  Neurological:     Mental Status: She is alert.  Psychiatric:        Mood and Affect: Mood normal.        Behavior: Behavior normal.      Condition at discharge: good  The results of significant diagnostics from this hospitalization (including imaging, microbiology, ancillary and laboratory) are listed below for reference.   Imaging Studies: DG Chest Port 1 View Result Date: 07/30/2024 CLINICAL DATA:  Fever, chills EXAM: PORTABLE CHEST 1 VIEW COMPARISON:  March 27, 2024 FINDINGS: The heart size and mediastinal contours are within normal limits. Both lungs are clear. The visualized skeletal structures are unremarkable. IMPRESSION: No active disease. Electronically Signed   By: Lynwood Landy Raddle M.D.   On: 07/30/2024 08:14    Microbiology: Results for orders placed or performed during the hospital encounter of 07/30/24  Culture, blood (Routine x 2)     Status: None (Preliminary result)   Collection Time: 07/30/24  6:15 AM   Specimen: BLOOD LEFT FOREARM  Result Value Ref Range Status   Specimen Description   Final    BLOOD LEFT FOREARM Performed at [facility], [address] [place], [place] [address]    Special Requests   Final    BOTTLES DRAWN AEROBIC AND ANAEROBIC Blood Culture results may not be optimal due to an inadequate volume of blood received in culture bottles Performed at [facility], [address] [place], [place] [address]     Culture   Final    NO GROWTH 3 DAYS Performed at [facility], [address], [place], [place] [address]    Report Status PENDING  Incomplete  Urine Culture     Status: Abnormal   Collection Time: 07/30/24  6:20 AM   Specimen: Urine, Random  Result Value Ref Range Status   Specimen Description   Final    URINE, RANDOM Performed at [facility], [address] [place], [place] [address]    Special Requests   Final    NONE Reflexed from [id number] Performed at [facility], [address] [place], [place] [address]    Culture MULTIPLE SPECIES PRESENT, SUGGEST RECOLLECTION (A)  Final   Report Status 07/31/2024 FINAL  Final  Culture, blood (Routine x 2)     Status: None (Preliminary result)   Collection Time: 07/30/24  6:24 AM   Specimen: BLOOD RIGHT WRIST  Result Value Ref Range Status   Specimen  Description   Final    BLOOD RIGHT WRIST Performed at [facility], [address], [place], [place] [address]    Special Requests   Final    BOTTLES DRAWN AEROBIC AND ANAEROBIC Blood Culture results may not be optimal due to an inadequate volume of blood received in culture bottles Performed at [facility], [address] [place], [place] [address]    Culture   Final    NO GROWTH 3 DAYS Performed at [facility], [address], [place], [place] [address]    Report Status PENDING  Incomplete  Resp panel by RT-PCR (RSV, Flu A&B, Covid) Anterior Nasal Swab     Status: None   Collection Time: 07/30/24  6:25 AM   Specimen: Anterior Nasal Swab  Result Value Ref Range Status   SARS Coronavirus 2 by RT PCR NEGATIVE NEGATIVE Final    Comment: (NOTE) SARS-CoV-2 target nucleic acids are NOT DETECTED.  The SARS-CoV-2 RNA is generally detectable in upper respiratory specimens during the acute phase of infection. The lowest concentration of SARS-CoV-2 viral copies this assay can detect is 138  copies/mL. A negative result does not preclude SARS-Cov-2 infection and should not be used as the sole basis for treatment or other patient management decisions. A negative result may occur with  improper specimen collection/handling, submission of specimen other than nasopharyngeal swab, presence of viral mutation(s) within the areas targeted by this assay, and inadequate number of viral copies(<138 copies/mL). A negative result must be combined with clinical observations, patient history, and epidemiological information. The expected result is Negative.  Fact Sheet for Patients:  bloggercourse.com  Fact Sheet for Healthcare Providers:  seriousbroker.it  This test is no t yet approved or cleared by the [place]  FDA and  has been authorized for detection and/or diagnosis of SARS-CoV-2 by FDA under an Emergency Use Authorization (EUA). This EUA will remain  in effect (meaning this test can be used) for the duration of the COVID-19 declaration under Section 564(b)(1) of the Act, 21 U.S.C.section 360bbb-3(b)(1), unless the authorization is terminated  or revoked sooner.       Influenza A by PCR NEGATIVE NEGATIVE Final   Influenza B by PCR NEGATIVE NEGATIVE Final    Comment: (NOTE) The Xpert Xpress SARS-CoV-2/FLU/RSV plus assay is intended as an aid in the diagnosis of influenza from Nasopharyngeal swab specimens and should not be used as a sole basis for treatment. Nasal washings and aspirates are unacceptable for Xpert Xpress SARS-CoV-2/FLU/RSV testing.  Fact Sheet for Patients: bloggercourse.com  Fact Sheet for Healthcare Providers: seriousbroker.it  This test is not yet approved or cleared by the [place]  FDA and has been authorized for detection and/or diagnosis of SARS-CoV-2 by FDA under an Emergency Use Authorization (EUA). This EUA will remain in effect (meaning  this test can be used) for the duration of the COVID-19 declaration under Section 564(b)(1) of the Act, 21 U.S.C. section 360bbb-3(b)(1), unless the authorization is terminated or revoked.     Resp Syncytial Virus by PCR NEGATIVE NEGATIVE Final    Comment: (NOTE) Fact Sheet for Patients: bloggercourse.com  Fact Sheet for Healthcare Providers: seriousbroker.it  This test is not yet approved or cleared by the [place]  FDA and has been authorized for detection and/or diagnosis of SARS-CoV-2 by FDA under an Emergency Use Authorization (EUA). This EUA will remain in effect (meaning this test can be used) for the duration of the COVID-19 declaration under Section 564(b)(1) of the Act, 21 U.S.C.  section 360bbb-3(b)(1), unless the authorization is terminated or revoked.  Performed at [facility], [address] [place], [place] [address]   Respiratory (~20 pathogens) panel by PCR     Status: None   Collection Time: 07/31/24  5:02 PM   Specimen: Nasopharyngeal Swab; Respiratory  Result Value Ref Range Status   Adenovirus NOT DETECTED NOT DETECTED Final   Coronavirus 229E NOT DETECTED NOT DETECTED Final    Comment: (NOTE) The Coronavirus on the Respiratory Panel, DOES NOT test for the novel  Coronavirus (2019 nCoV)    Coronavirus HKU1 NOT DETECTED NOT DETECTED Final   Coronavirus NL63 NOT DETECTED NOT DETECTED Final   Coronavirus OC43 NOT DETECTED NOT DETECTED Final   Metapneumovirus NOT DETECTED NOT DETECTED Final   Rhinovirus / Enterovirus NOT DETECTED NOT DETECTED Final   Influenza A NOT DETECTED NOT DETECTED Final   Influenza B NOT DETECTED NOT DETECTED Final   Parainfluenza Virus 1 NOT DETECTED NOT DETECTED Final   Parainfluenza Virus 2 NOT DETECTED NOT DETECTED Final   Parainfluenza Virus 3 NOT DETECTED NOT DETECTED Final   Parainfluenza Virus 4 NOT DETECTED NOT DETECTED Final   Respiratory Syncytial  Virus NOT DETECTED NOT DETECTED Final   Bordetella pertussis NOT DETECTED NOT DETECTED Final   Bordetella Parapertussis NOT DETECTED NOT DETECTED Final   Chlamydophila pneumoniae NOT DETECTED NOT DETECTED Final   Mycoplasma pneumoniae NOT DETECTED NOT DETECTED Final    Comment: Performed at [facility], [address], [place], [place] [address]    Labs: CBC: Recent Labs  Lab 07/30/24 [id number] 08/01/24 0440 08/02/24 0403  WBC 8.1 5.2 5.5  NEUTROABS 6.6  --   --   HGB 10.4* 9.3* 9.8*  HCT 31.8* 28.8* 30.4*  MCV 81.5 81.6 81.9  PLT 178 122* 165   Basic Metabolic Panel: Recent Labs  Lab 07/30/24 0620 07/31/24 0926 08/01/24 0440  NA 135 141 140  K 4.1 3.9 4.3  CL 97* 104 103  CO2 26 27 27   GLUCOSE 209* 104* 79  BUN 25* 20 22*  CREATININE 2.41* 1.97* 1.77*  CALCIUM 8.9 9.1 8.9   Liver Function Tests: Recent Labs  Lab 07/30/24 0620  AST 29  ALT 23  ALKPHOS 104  BILITOT 0.6  PROT 6.5  ALBUMIN 3.7   CBG: No results for input(s): GLUCAP in the last 168 hours.  Discharge time spent: less than 30 minutes.  Signed: Toribio Door, MD [facility] 08/02/2024 "

## 2024-08-04 LAB — CULTURE, BLOOD (ROUTINE X 2)
Culture: NO GROWTH
Culture: NO GROWTH

## 2025-01-08 ENCOUNTER — Ambulatory Visit: Admitting: Internal Medicine
